# Patient Record
Sex: Male | Born: 1950 | Race: White | Hispanic: No | Marital: Married | Smoking: Former smoker
Health system: Southern US, Community
[De-identification: ages and names within clinical notes are randomized; demographics above are authoritative.]

## PROBLEM LIST (undated history)

## (undated) DIAGNOSIS — K5790 Diverticulosis of intestine, part unspecified, without perforation or abscess without bleeding: Secondary | ICD-10-CM

## (undated) DIAGNOSIS — I10 Essential (primary) hypertension: Secondary | ICD-10-CM

## (undated) DIAGNOSIS — K219 Gastro-esophageal reflux disease without esophagitis: Secondary | ICD-10-CM

## (undated) DIAGNOSIS — R011 Cardiac murmur, unspecified: Secondary | ICD-10-CM

## (undated) DIAGNOSIS — E7800 Pure hypercholesterolemia, unspecified: Secondary | ICD-10-CM

## (undated) DIAGNOSIS — I341 Nonrheumatic mitral (valve) prolapse: Secondary | ICD-10-CM

## (undated) DIAGNOSIS — I639 Cerebral infarction, unspecified: Secondary | ICD-10-CM

## (undated) HISTORY — PX: TEMPOROMANDIBULAR JOINT SURGERY: SHX35

## (undated) HISTORY — PX: RETINAL DETACHMENT SURGERY: SHX105

## (undated) HISTORY — PX: MITRAL VALVE REPLACEMENT: SHX147

---

## 2007-10-11 ENCOUNTER — Ambulatory Visit: Payer: Self-pay | Admitting: Internal Medicine

## 2011-01-14 ENCOUNTER — Ambulatory Visit: Payer: Self-pay | Admitting: Ophthalmology

## 2012-03-16 ENCOUNTER — Ambulatory Visit: Payer: Self-pay | Admitting: Ophthalmology

## 2012-09-21 ENCOUNTER — Ambulatory Visit: Payer: Self-pay | Admitting: Ophthalmology

## 2012-12-09 ENCOUNTER — Ambulatory Visit: Payer: Self-pay | Admitting: Orthopedic Surgery

## 2012-12-20 ENCOUNTER — Ambulatory Visit: Payer: Self-pay | Admitting: Orthopedic Surgery

## 2014-06-01 ENCOUNTER — Ambulatory Visit: Payer: Self-pay | Admitting: Gastroenterology

## 2014-06-04 LAB — PATHOLOGY REPORT

## 2015-04-16 NOTE — Op Note (Signed)
PATIENT NAME:  Terry [name], Terry [name] MR#:  [id number] DATE OF BIRTH:  October 22, 1951  DATE OF PROCEDURE:  09/21/2012  PROCEDURES PERFORMED:  1. Pars plana vitrectomy of the right eye.  2. Internal limiting membrane peel of the right eye.   PREOPERATIVE DIAGNOSIS: Epiretinal membrane and retinal edema (Not Dictated)   POSTOPERATIVE DIAGNOSIS: <<Same>> (Not Dictated)   ESTIMATED BLOOD LOSS: Less than 1 mL.  PRIMARY SURGEON: Ignacia FellingMatthew F. Criselda Starke, MD   ANESTHESIA: Retrobulbar block of the right eye with monitored anesthesia care.   COMPLICATIONS: None.   INDICATIONS FOR PROCEDURE: This is a patient who presented to my office several months after retinal detachment repair with severe metamorphopsia in the right eye. Examination revealed an extremely significant epiretinal membrane of the right eye. There was associated retinal edema. Risks, benefits, and alternatives of the above procedure were discussed and the patient wished to proceed.   DETAILS OF PROCEDURE: After informed consent was obtained, the patient was brought into the operative suite at [facility]. The patient was placed in supine position, was given a small dose of Alfenta, and a retrobulbar block performed on the right eye by the primary surgeon without any complications. Right eye was prepped and draped in a sterile manner. After lid speculum was inserted, a 25-gauge trocar was placed inferotemporally through displaced conjunctiva in an oblique fashion 3 mm beyond the limbus. The infusion cannula was turned on and inserted through the trocar and secured into position with Steri-Strips. Two more trocars were placed in a similar fashion superotemporally and superonasally. The vitreous cutter and light pipe were introduced in the eye and peripheral vitreous was trimmed. Indocyanine green was injected onto the posterior pole and removed within 30 seconds. The epiretinal membrane was peeled from the retina without any  complication. Internal limiting membrane was then peeled around the fovea. Internal limiting membrane was confirmed to have come up with the epiretinal membrane nasally. The internal limiting membrane was peeled for 1.5 disk diameters inferiorly, temporally, and superiorly. The periphery was examined for 360 degrees of scleral depressed exam. No signs of any breaks or tears could be identified. A partial air-fluid exchange was performed. The trocars were removed and the wounds were noted to be airtight. 5 mg of dexamethasone was given into the inferior fornix and the lid speculum was removed. The eye was cleaned and TobraDex was placed in the eye. A patch and shield was placed over the eye and the patient was taken to postanesthesia care with instructions to remain head up.  ____________________________ Ignacia FellingMatthew F. Champ MungoAppenzeller, MD mfa:drc D: 09/21/2012 09:05:55 ET T: 09/21/2012 12:34:21 ET JOB#: [id number]  cc: Ignacia FellingMatthew F. Champ MungoAppenzeller, MD, <Dictator> Cline CoolsMATTHEW F Ebonee Stober MD ELECTRONICALLY SIGNED 10/25/2012 17:44

## 2015-04-21 NOTE — Op Note (Signed)
PATIENT NAME:  [name], [name] MR#:  [id number] DATE OF BIRTH:  02-10-1951  DATE OF PROCEDURE:  03/16/2012  PROCEDURES PERFORMED:  1. Pars planGeorgeanna Harrisona [name] of the right eye 2. Gas exchange, right eye.  3. Endolaser, right eye.  PREOPERATIVE DIAGNOSIS: Rhegmatogenous retinal detachment of the right eye.   POSTOPERATIVE DIAGNOSIS: Rhegmatogenous retinal detachment of the right eye.  ESTIMATED BLOOD LOSS: Less than 1 mL.  PRIMARY SURGEON: Terry FellingMatthew F. Zuzu Befort, MD   ANESTHESIA: Retrobulbar block of the right eye with monitored anesthesia care.   COMPLICATIONS: None.   INDICATIONS FOR PROCEDURE: This is a patient who presented to my office with a curtain in his right eye and decreased vision. Examination revealed a macula off rhegmatogenous retinal detachment of the right eye. Risks, benefits, and alternatives of the above procedure were discussed and the patient wished to proceed.   DETAILS OF PROCEDURE: After informed consent was obtained, the patient was brought in the operative suite at [facility]. The patient was placed in supine position and was given a small dose of alfentin and a retrobulbar block was performed on the right eye by the primary surgeon without any complications. The right eye was prepped and draped in sterile manner. After lid speculum was inserted, a 25-gauge trocar was placed inferotemporally through displaced conjunctiva in an oblique fashion 3 mm beyond the limbus. The infusion cannula was turned on and inserted through the trocar and secured into position with Steri-Strips. Two more trocars were placed in a similar fashion superotemporally and superonasally. The vitreous cutter and light pipe were introduced into the eye and a core [name] was performed. Extreme care was taken to do peripheral vitreal shaving out to the ora serrata for 360 degrees. A small area of lattice was noted inferiorly without any signs of tears. Extremely small microscopic  pseudophakic breaks were noted in the far periphery of the retinal detachment. Endo cautery was introduced in the eye and the inferior lattice was marked and a draining retinotomy was created at approximately 1 o'clock above the temporal arcades. Soft tip was introduced and a complete air-fluid exchange was performed and the retina was completely flattened. Endolaser was introduced into the eye and four rows of laser was placed around the draining retinotomy. Five to six rows of laser was placed in the peripheral retina from one end of the retinal detachment to the other to cover for any pseudophakic small breaks. Three to four rows of laser was placed around the inferior lattice. No other breaks or tears could be identified. Any remnant fluid was then removed. The superonasal trocar was removed. 22% SF6 was used as an Systems developerair-gas exchange. Once completed, the trocars were removed and the superotemporal wound was noted to have a very small air leak. This was closed using transconjunctival interrupted 6-0 plain gut. The SF6 was used on a 30-gauge needle to pressurize the eye to approximately 15 mmHg. The wounds were noted to be airtight and 5 mg of dexamethasone was given into the inferior fornix. The lid speculum was removed and the eye was cleaned. TobraDex was placed on the eye and a patch and shield was placed over the eye. The patient was taken to postanesthesia care with instructions to remain on his right side for one hour followed by head up positioning.   ____________________________ Terry FellingMatthew F. Champ MungoAppenzeller, MD mfa:drc D: 03/16/2012 11:34:03 ET T: 03/16/2012 12:19:23 ET JOB#: [id number]  cc: Terry FellingMatthew F. Champ MungoAppenzeller, MD, <Dictator> Terry CoolsMATTHEW F Ferron Ishmael MD ELECTRONICALLY SIGNED 03/30/2012 7:17

## 2017-02-18 ENCOUNTER — Ambulatory Visit
Admission: RE | Admit: 2017-02-18 | Discharge: 2017-02-18 | Disposition: A | Discharge status: Home / Self Care | Payer: BLUE CROSS/BLUE SHIELD | Source: Ambulatory Visit | Attending: Cardiology | Admitting: Cardiology

## 2017-02-18 ENCOUNTER — Encounter
Admission: RE | Disposition: A | Discharge status: Home / Self Care | Payer: Self-pay | Source: Ambulatory Visit | Attending: Cardiology

## 2017-02-18 ENCOUNTER — Encounter: Payer: Self-pay | Admitting: *Deleted

## 2017-02-18 DIAGNOSIS — Z881 Allergy status to other antibiotic agents status: Secondary | ICD-10-CM | POA: Diagnosis not present

## 2017-02-18 DIAGNOSIS — Z87891 Personal history of nicotine dependence: Secondary | ICD-10-CM | POA: Insufficient documentation

## 2017-02-18 DIAGNOSIS — Z880 Allergy status to penicillin: Secondary | ICD-10-CM | POA: Insufficient documentation

## 2017-02-18 DIAGNOSIS — Z91041 Radiographic dye allergy status: Secondary | ICD-10-CM | POA: Diagnosis not present

## 2017-02-18 DIAGNOSIS — K219 Gastro-esophageal reflux disease without esophagitis: Secondary | ICD-10-CM | POA: Diagnosis not present

## 2017-02-18 DIAGNOSIS — I10 Essential (primary) hypertension: Secondary | ICD-10-CM | POA: Diagnosis not present

## 2017-02-18 DIAGNOSIS — I2510 Atherosclerotic heart disease of native coronary artery without angina pectoris: Secondary | ICD-10-CM | POA: Diagnosis not present

## 2017-02-18 DIAGNOSIS — E785 Hyperlipidemia, unspecified: Secondary | ICD-10-CM | POA: Insufficient documentation

## 2017-02-18 DIAGNOSIS — I340 Nonrheumatic mitral (valve) insufficiency: Secondary | ICD-10-CM | POA: Diagnosis present

## 2017-02-18 HISTORY — PX: RIGHT/LEFT HEART CATH AND CORONARY ANGIOGRAPHY: CATH118266

## 2017-02-18 HISTORY — DX: Nonrheumatic mitral (valve) prolapse: I34.1

## 2017-02-18 HISTORY — DX: Essential (primary) hypertension: I10

## 2017-02-18 HISTORY — DX: Pure hypercholesterolemia, unspecified: E78.00

## 2017-02-18 HISTORY — DX: Cardiac murmur, unspecified: R01.1

## 2017-02-18 HISTORY — DX: Diverticulosis of intestine, part unspecified, without perforation or abscess without bleeding: K57.90

## 2017-02-18 HISTORY — DX: Gastro-esophageal reflux disease without esophagitis: K21.9

## 2017-02-18 SURGERY — RIGHT/LEFT HEART CATH AND CORONARY ANGIOGRAPHY
Anesthesia: Moderate Sedation | Laterality: Bilateral

## 2017-02-18 MED ORDER — FAMOTIDINE 20 MG PO TABS
ORAL_TABLET | ORAL | Status: AC
  Administered 2017-02-18: 20 mg
  Filled 2017-02-18: qty 1

## 2017-02-18 MED ORDER — IOPAMIDOL (ISOVUE-300) INJECTION 61%
INTRAVENOUS | Status: DC | PRN
  Administered 2017-02-18: 160 mL via INTRA_ARTERIAL

## 2017-02-18 MED ORDER — METHYLPREDNISOLONE SODIUM SUCC 125 MG IJ SOLR
125.0000 mg | Freq: Once | INTRAMUSCULAR | Status: AC
  Administered 2017-02-18: 125 mg via INTRAVENOUS

## 2017-02-18 MED ORDER — ASPIRIN 81 MG PO CHEW: 81.0000 mg | CHEWABLE_TABLET | Freq: Once | ORAL | Status: DC

## 2017-02-18 MED ORDER — ASPIRIN 81 MG PO CHEW
CHEWABLE_TABLET | ORAL | Status: AC
  Administered 2017-02-18: 81 mg
  Filled 2017-02-18: qty 1

## 2017-02-18 MED ORDER — HEPARIN (PORCINE) IN NACL 2-0.9 UNIT/ML-% IJ SOLN
INTRAMUSCULAR | Status: AC
  Filled 2017-02-18: qty 500

## 2017-02-18 MED ORDER — FENTANYL CITRATE (PF) 100 MCG/2ML IJ SOLN
INTRAMUSCULAR | Status: AC
  Filled 2017-02-18: qty 2

## 2017-02-18 MED ORDER — SODIUM CHLORIDE 0.9 % IV SOLN
INTRAVENOUS | Status: DC
  Administered 2017-02-18: 07:00:00 via INTRAVENOUS

## 2017-02-18 MED ORDER — SODIUM CHLORIDE 0.9% FLUSH: 10.0000 mL | Freq: Once | INTRAVENOUS | Status: DC

## 2017-02-18 MED ORDER — HYDROCOD POLST-CPM POLST ER 10-8 MG/5ML PO SUER: 5.0000 mL | Freq: Once | ORAL | Status: DC

## 2017-02-18 MED ORDER — FAMOTIDINE 20 MG PO TABS: 20.0000 mg | ORAL_TABLET | Freq: Once | ORAL | Status: DC

## 2017-02-18 MED ORDER — FENTANYL CITRATE (PF) 100 MCG/2ML IJ SOLN
INTRAMUSCULAR | Status: DC | PRN
  Administered 2017-02-18: 25 ug via INTRAVENOUS

## 2017-02-18 MED ORDER — MIDAZOLAM HCL 2 MG/2ML IJ SOLN
INTRAMUSCULAR | Status: DC | PRN
  Administered 2017-02-18: 1 mg via INTRAVENOUS

## 2017-02-18 MED ORDER — METHYLPREDNISOLONE SODIUM SUCC 125 MG IJ SOLR
INTRAMUSCULAR | Status: AC
  Filled 2017-02-18: qty 2

## 2017-02-18 MED ORDER — MIDAZOLAM HCL 2 MG/2ML IJ SOLN
INTRAMUSCULAR | Status: AC
  Filled 2017-02-18: qty 2

## 2017-02-18 SURGICAL SUPPLY — 16 items
CATH 5FR JL4 DIAGNOSTIC (CATHETERS) ×2 IMPLANT
CATH 5FR JR4 DIAGNOSTIC (CATHETERS) ×3 IMPLANT
CATH INFINITI 5FR ANG PIGTAIL (CATHETERS) ×3 IMPLANT
CATH SWANZ 7F THERMO (CATHETERS) ×3 IMPLANT
DEVICE CLOSURE MYNXGRIP 5F (Vascular Products) ×3 IMPLANT
DEVICE INFLAT 30 PLUS (MISCELLANEOUS) IMPLANT
GUIDEWIRE EMER 3M J .025X150CM (WIRE) ×3 IMPLANT
KIT MANI 3VAL PERCEP (MISCELLANEOUS) ×3 IMPLANT
KIT RIGHT HEART (MISCELLANEOUS) ×3 IMPLANT
NEEDLE PERC 18GX7CM (NEEDLE) ×3 IMPLANT
PACK CARDIAC CATH (CUSTOM PROCEDURE TRAY) ×3 IMPLANT
SHEATH AVANTI 5FR X 11CM (SHEATH) ×3 IMPLANT
SHEATH AVANTI 6FR X 11CM (SHEATH) IMPLANT
SHEATH PINNACLE 7F 10CM (SHEATH) ×3 IMPLANT
WIRE EMERALD 3MM-J .035X150CM (WIRE) ×3 IMPLANT
WIRE EMERALD ST .035X150CM (WIRE) ×3 IMPLANT

## 2017-09-27 ENCOUNTER — Encounter (INDEPENDENT_AMBULATORY_CARE_PROVIDER_SITE_OTHER): Payer: Self-pay | Admitting: Vascular Surgery

## 2017-09-27 ENCOUNTER — Other Ambulatory Visit (INDEPENDENT_AMBULATORY_CARE_PROVIDER_SITE_OTHER): Payer: BLUE CROSS/BLUE SHIELD

## 2017-09-27 ENCOUNTER — Ambulatory Visit (INDEPENDENT_AMBULATORY_CARE_PROVIDER_SITE_OTHER): Payer: BLUE CROSS/BLUE SHIELD | Admitting: Vascular Surgery

## 2017-09-27 DIAGNOSIS — I872 Venous insufficiency (chronic) (peripheral): Secondary | ICD-10-CM

## 2017-09-27 DIAGNOSIS — M79606 Pain in leg, unspecified: Secondary | ICD-10-CM | POA: Insufficient documentation

## 2017-09-27 DIAGNOSIS — I8390 Asymptomatic varicose veins of unspecified lower extremity: Secondary | ICD-10-CM | POA: Insufficient documentation

## 2017-09-27 DIAGNOSIS — I83811 Varicose veins of right lower extremities with pain: Secondary | ICD-10-CM

## 2017-09-27 DIAGNOSIS — M79604 Pain in right leg: Secondary | ICD-10-CM

## 2017-09-27 DIAGNOSIS — K219 Gastro-esophageal reflux disease without esophagitis: Secondary | ICD-10-CM

## 2017-09-27 NOTE — Progress Notes (Addendum)
MRN : [id number]  Terry [name] is a 66 y.o. (24-Jan-1951) male who presents with chief complaint of  Chief Complaint  Patient presents with  . Varicose Veins  .  History of Present Illness: The patient is seen for evaluation of symptomatic varicose veins. The patient relates burning and stinging which worsened steadily throughout the course of the day, particularly with standing. The patient also notes an aching and throbbing pain over the varicosities, particularly with prolonged dependent positions. The symptoms are significantly improved with elevation.  The patient also notes that during hot weather the symptoms are greatly intensified. The patient states the pain from the varicose veins interferes with work, daily exercise, shopping and household maintenance. At this point, the symptoms are persistent and severe enough that they're having a negative impact on lifestyle and are interfering with daily activities.  There is no history of DVT, PE or superficial thrombophlebitis. There is no history of ulceration or hemorrhage. The patient denies a significant family history of varicose veins.   The patient has worn graduated compression in the past. At the present time the patient has not been using over-the-counter analgesics. There is a history of prior surgical intervention and sclerotherapy at another institution.    Current Meds  Medication Sig  . clobetasol ointment (TEMOVATE) 0.05 % Apply 1 application topically every other day.  . Multiple Vitamins-Minerals (MULTIVITAMIN PO) Take 1 tablet by mouth daily.  Marland Kitchen omeprazole (PRILOSEC) 20 MG capsule Take 20 mg by mouth daily.  . Probiotic Product (PROBIOTIC PO) Take 1 capsule by mouth daily.  Marland Kitchen triamcinolone cream (KENALOG) 0.1 % Apply 1 application topically every other day.   . valACYclovir (VALTREX) 500 MG tablet Take 500 mg by mouth daily.  . vitamin B-12 (CYANOCOBALAMIN) 1000 MCG tablet Take 1,000 mcg by mouth daily.    Past  Medical History:  Diagnosis Date  . Diverticulosis   . GERD (gastroesophageal reflux disease)   . Heart murmur   . Hypercholesteremia   . Hypertension   . Mitral valve prolapse     Past Surgical History:  Procedure Laterality Date  . RETINAL DETACHMENT SURGERY    . RIGHT/LEFT HEART CATH AND CORONARY ANGIOGRAPHY Bilateral 02/18/2017   Procedure: Right/Left Heart Cath and Coronary Angiography;  Surgeon: Marcina Millard, MD;  Location: [facility] INVASIVE CV LAB;  Service: Cardiovascular;  Laterality: Bilateral;  . TEMPOROMANDIBULAR JOINT SURGERY      Social History Social History  Substance Use Topics  . Smoking status: Former Smoker    Quit date: 1985  . Smokeless tobacco: Never Used  . Alcohol use Yes     Comment: ocassionally    Family History Family History  Problem Relation Age of Onset  . Varicose Veins Mother   . Cancer Father   No family history of bleeding/clotting disorders, porphyria or autoimmune disease   Allergies  Allergen Reactions  . Penicillins Shortness Of Breath  . Ciprofloxacin Rash  . Ivp Dye [Iodinated Diagnostic Agents] Rash     REVIEW OF SYSTEMS (Negative unless checked)  Constitutional: Weight loss  Fever  Chills Cardiac: Chest pain   Chest pressure   Palpitations   Shortness of breath when laying flat   Shortness of breath with exertion. Vascular:  Pain in legs with walking   Pain in legs with standing  History of DVT   Phlebitis   Swelling in legs   Varicose veins   Non-healing ulcers Pulmonary:   Uses home oxygen   Productive cough     Hemoptysis   Wheeze  COPD   Asthma Neurologic:  Dizziness   Seizures   History of stroke   History of TIA  Aphasia   Vissual changes   Weakness or numbness in arm   Weakness or numbness in leg Musculoskeletal:   Joint swelling   Joint pain   Low back pain Hematologic:  Easy bruising  Easy bleeding   Hypercoagulable state    Anemic Gastrointestinal:  Diarrhea   Vomiting  Gastroesophageal reflux/heartburn   Difficulty swallowing. Genitourinary:  Chronic kidney disease   Difficult urination  Frequent urination   Blood in urine Skin:  Rashes   Ulcers  Psychological:  History of anxiety    History of major depression.  Physical Examination  Vitals:   09/27/17 0957  BP: (!) 153/85  Pulse: 60  Resp: 17  Weight: 182 lb (82.6 kg)  Height:  (1.676 m)   Body mass index is 29.38 kg/m. Gen: WD/WN, NAD Head: [place]/AT, No temporalis wasting.  Ear/Nose/Throat: Hearing grossly intact, nares w/o erythema or drainage, poor dentition Eyes: PER, EOMI, sclera nonicteric.  Neck: Supple, no masses.  No bruit or JVD.  Pulmonary:  Good air movement, clear to auscultation bilaterally, no use of accessory muscles.  Cardiac: RRR, normal S1, S2, no Murmurs. Vascular: Large varicosities present extensively greater than 10 mm right leg primarily on the posterior calf.  Mild venous stasis changes to the legs bilaterally. 1+ soft pitting edema Vessel Right Left  Radial Palpable Palpable  PT Palpable Palpable  DP Palpable Palpable  Gastrointestinal: soft, non-distended. No guarding/no peritoneal signs.  Musculoskeletal: M/S 5/5 throughout.  No deformity or atrophy.  Neurologic: CN 2-12 intact. Pain and light touch intact in extremities.  Symmetrical.  Speech is fluent. Motor exam as listed above. Psychiatric: Judgment intact, Mood & affect appropriate for pt's clinical situation. Dermatologic: No rashes or ulcers noted.  No changes consistent with cellulitis. Lymph : No Cervical lymphadenopathy, no lichenification or skin changes of chronic lymphedema.  CBC No results found for: WBC, HGB, HCT, MCV, PLT  BMET No results found for: NA, K, CL, CO2, GLUCOSE, BUN, CREATININE, CALCIUM, GFRNONAA, GFRAA CrCl cannot be calculated (No order found.).  COAG No results found for: INR, PROTIME  Radiology No  results found.    Assessment/Plan 1. Pain of right lower extremity  Recommend:  The patient has large symptomatic varicose veins that are painful and associated with swelling.  I have had a long discussion with the patient regarding  varicose veins and why they cause symptoms.  Patient will begin wearing graduated compression stockings class 1 on a daily basis, beginning first thing in the morning and removing them in the evening. The patient is instructed specifically not to sleep in the stockings.    The patient  will also begin using over-the-counter analgesics such as Motrin 600 mg po TID to help control the symptoms.    In addition, behavioral modification including elevation during the day will be initiated.    Pending the results of these changes the  patient will be reevaluated in three months.   An  ultrasound of the venous system will be obtained.   Further plans will be based on the ultrasound results and whether conservative therapies are successful at eliminating the pain and swelling.   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  He still has persistent symptoms of pain and swelling that are having a negative impact on daily life and daily activities.  Patient should undergo injection sclerotherapy to treat the residual varicosities.  The risks, benefits and alternative therapies were reviewed in detail with the patient.  All questions were answered.  The patient agrees to proceed with sclerotherapy at their convenience.  The patient will continue wearing the graduated compression stockings and using the over-the-counter pain medications to treat her symptoms.       2. Varicose veins of right lower extremity with pain  Recommend:  The patient has large symptomatic  varicose veins that are painful and associated with swelling.  I have had a long discussion with the patient regarding  varicose veins and why they cause symptoms.  Patient will begin wearing graduated compression stockings class 1 on a daily basis, beginning first thing in the morning and removing them in the evening. The patient is instructed specifically not to sleep in the stockings.    The patient  will also begin using over-the-counter analgesics such as Motrin 600 mg po TID to help control the symptoms.    In addition, behavioral modification including elevation during the day will be initiated.    Pending the results of these changes the  patient will be reevaluated in three months.   An  ultrasound of the venous system will be obtained.   Further plans will be based on the ultrasound results and whether conservative therapies are successful at eliminating the pain and swelling.   - VAS [place] LOWER EXTREMITY VENOUS REFLUX; Future  3. Venous insufficiency No surgery or intervention at this point in time.    I have had a long discussion with the patient regarding venous insufficiency and why it  causes symptoms. I have discussed with the patient the chronic skin changes that accompany venous insufficiency and the long term sequela such as infection and ulceration.  Patient will begin wearing graduated compression stockings class 1 (20-30 mmHg) or compression wraps on a daily basis a prescription was given. The patient will put the stockings on first thing in the morning and removing them in the evening. The patient is instructed specifically not to sleep in the stockings.    In addition, behavioral modification including several periods of elevation of the lower extremities during the day will be continued. I have demonstrated that proper elevation is a position with the ankles at heart level.  The patient is instructed to begin routine exercise, especially walking on a daily basis  Patient  should undergo duplex ultrasound of the venous system to ensure that DVT or reflux is not present.  Following the review of the ultrasound the patient will follow up in 2-3 months to reassess the degree of swelling and the control that graduated compression stockings or compression wraps  is offering.   The patient can be assessed for a Lymph Pump at that time  4. Gastroesophageal reflux disease without esophagitis Continue PPI as already ordered, these medications have been reviewed and there are no changes at this time.     Levora Dredge, MD  09/27/2017 10:23 AM

## 2017-11-08 ENCOUNTER — Encounter (INDEPENDENT_AMBULATORY_CARE_PROVIDER_SITE_OTHER): Payer: Self-pay | Admitting: Vascular Surgery

## 2017-11-08 ENCOUNTER — Ambulatory Visit (INDEPENDENT_AMBULATORY_CARE_PROVIDER_SITE_OTHER): Payer: BLUE CROSS/BLUE SHIELD | Admitting: Vascular Surgery

## 2017-11-08 VITALS — BP 148/88 | HR 85 | Resp 16 | Wt 185.0 lb

## 2017-11-08 DIAGNOSIS — I83813 Varicose veins of bilateral lower extremities with pain: Secondary | ICD-10-CM

## 2017-11-08 DIAGNOSIS — I872 Venous insufficiency (chronic) (peripheral): Secondary | ICD-10-CM

## 2017-11-08 NOTE — Progress Notes (Signed)
    MRN : [id number]  Georgeanna HarrisonLarry K Kopecky is a 66 y.o. (May 28, 1951) male who presents with chief complaint of painful varicose vein   Procedure:  Sclerotherapy using hypertonic saline mixed with 1% Lidocaine was performed on the right lower extremity.  Compression wraps were placed.  The patient tolerated the procedure well.  Plan:  Follow up as arranged

## 2018-03-30 ENCOUNTER — Other Ambulatory Visit: Payer: Self-pay

## 2018-03-30 ENCOUNTER — Ambulatory Visit: Payer: Medicare HMO | Attending: Sports Medicine | Admitting: Physical Therapy

## 2018-03-30 ENCOUNTER — Encounter: Payer: Self-pay | Admitting: Physical Therapy

## 2018-03-30 DIAGNOSIS — M25562 Pain in left knee: Secondary | ICD-10-CM | POA: Diagnosis present

## 2018-03-30 DIAGNOSIS — M5440 Lumbago with sciatica, unspecified side: Secondary | ICD-10-CM | POA: Diagnosis present

## 2018-03-30 DIAGNOSIS — M6281 Muscle weakness (generalized): Secondary | ICD-10-CM | POA: Insufficient documentation

## 2018-03-31 NOTE — Therapy (Signed)
[name] [name], [place], [address] Phone: [phone]   Fax:  [phone]  Physical Therapy Evaluation  Patient Details  Name: [name] MRN: [id number] Date of Birth: 1951/09/04 Referring Provider: Rodolph Bong MD   Encounter Date: 03/30/2018  PT End of Session - 03/30/18 1200    Visit Number  1    Number of Visits  12    Date for PT Re-Evaluation  05/11/18    PT Start Time  1028    PT Stop Time  1125    PT Time Calculation (min)  57 min    Activity Tolerance  Patient tolerated treatment well    Behavior During Therapy  [facility] for tasks assessed/performed       Past Medical History:  Diagnosis Date  . Diverticulosis   . GERD (gastroesophageal reflux disease)   . Heart murmur   . Hypercholesteremia   . Hypertension   . Mitral valve prolapse     Past Surgical History:  Procedure Laterality Date  . RETINAL DETACHMENT SURGERY    . RIGHT/LEFT HEART CATH AND CORONARY ANGIOGRAPHY Bilateral 02/18/2017   Procedure: Right/Left Heart Cath and Coronary Angiography;  Surgeon: Terry Millard, MD;  Location: [facility] INVASIVE CV LAB;  Service: Cardiovascular;  Laterality: Bilateral;  . TEMPOROMANDIBULAR JOINT SURGERY      There were no vitals filed for this visit.   Subjective Assessment - 03/30/18 1038    Subjective  Patient reports he has pain in hips with sitting in car for prolonged time, better after getting up and walking a little while.     Pertinent History  Patient reports having hip and back pain over the years then last year he lifted something and felt pan in back. He went to MD and was told he had inflammation in hips and mild problem in knees.     Limitations  Sitting;Lifting;Standing;House hold activities;Other (comment) bending and repeative bending    Patient Stated Goals  to decrease pain and be able to drive, sit withou pain    Currently in Pain?  Yes    Pain Score  3  worst  10/10 with sitting 30 min.; best pain 0/10 with walking    Pain Location  Hip    Pain Orientation  Right;Left;Posterior    Pain Descriptors / Indicators  Aching    Pain Type  Chronic pain    Pain Onset  More than a month ago    Pain Frequency  Intermittent    Aggravating Factors   sitting    Pain Relieving Factors  walking, resting    Effect of Pain on Daily Activities  driving         [facility] PT Assessment - 03/30/18 1047      Assessment   Medical Diagnosis  Low back pain, left leg weakness, lumbar radiculopathy    Referring Provider  Terry Bong MD    Onset Date/Surgical Date  01/28/17    Hand Dominance  Right    Prior Therapy  no      Balance Screen   Has the patient fallen in the past 6 months  Yes    How many times?  1x 3 weeks ago passed out and fell on floor getting up too fast     Has the patient had a decrease in activity level because of a fear of falling?   No    Is the patient reluctant to leave their home because  of a fear of falling?   No      Home Public house manager residence    Living Arrangements  Spouse/significant other;Children    Type of Home  House    Home Access  Stairs to enter    Entrance Stairs-Number of Steps  4    Entrance Stairs-Rails  Right;Left    Home Layout  One level      Prior Function   Level of Independence  Independent    Vocation  Retired    Leisure  fishing, golfing       Cognition   Overall Cognitive Status  Within Functional Limits for tasks assessed      Observation/Other Assessments   Focus on Therapeutic Outcomes (FOTO)   to be assessed    Modified [place]  to be assessed      Posture/Postural Control   Posture Comments  increased thoracic kyphosis, decresaed lumbar lordosis      ROM / Strength   AROM / PROM / Strength  AROM;Strength      AROM   Overall AROM Comments  lumbar spine limited flexion 50%, extension 25%      Strength   Overall Strength Comments  bilateral LE's major muscle  groups WNL's without reproduction of symptoms       Palpation   Palpation comment  spasms left lumbar spine, no tenderness over SIJs      FABER test   findings  Negative    Comment  right hip limited ER as compared to right with stretch pain in hip at full ER      Slump test   Findings  Negative    Comment  bilaterally      Straight Leg Raise   Findings  Negative    Comment  bilaterallly and (-) crossed SLR        Objective measurements completed on examination: See above findings.      PT Education - 03/30/18 1100    Education provided  Yes    Education Details  POC; body mechanics,exercises; prone, prone prop on elbows, hip addution with ball and glute squeeze    Person(s) Educated  Patient    Methods  Explanation;Demonstration;Verbal cues;Handout    Comprehension  Verbal cues required;Returned demonstration;Verbalized understanding          PT Long Term Goals - 03/30/18 1130      PT LONG TERM GOAL #1   Title  Patient will improve FOTO and MODI scores by 10 points demonstrating improved function with daily tasks with decreased pain    Status  New    Target Date  04/20/18      PT LONG TERM GOAL #2   Title  Patient will improve FOTO and MODI scores by 20 points demonstrating improved function with daily tasks with decreased pain    Status  New    Target Date  05/11/18      PT LONG TERM GOAL #3   Title  Patient will be independent with home program for pain control and exercises to allow transition to self management    Baseline  patin has limited knowledge of appropriate pain control strategies and exercise progression without guidance, cuing    Status  New    Target Date  05/11/18             Plan - 03/30/18 1130    Clinical Impression Statement  Patient is a 67 year old male who presents with chronic  back, hip and knee pain. He has limitations with ROM lumbar spiine and right hip rotation. He has functional limitations with driving and bending  activities. He has limited knowledge of appropriate pain control strategies, exercises and progression annd will benefit from physical therapy intervention.     History and Personal Factors relevant to plan of care:  Patient reports having hip and back pain over the years then last year he lifted something and felt pan in back. He went to MD and was told he had inflammation in hips and mild problem in knees.     Clinical Presentation  Stable    Clinical Decision Making  Low    Rehab Potential  Good    Clinical Impairments Affecting Rehab Potential  (+)motivated, prior level of function (-)age, chronic condition    PT Frequency  2x / week    PT Duration  6 weeks    PT Treatment/Interventions  Cryotherapy;Electrical Stimulation;Ultrasound;Moist Heat;Therapeutic activities;Therapeutic exercise;Patient/family education;Neuromuscular re-education;Manual techniques    PT Next Visit Plan  pain control, assess FOTO, MODI; exercise progression core     PT Home Exercise Plan  prone, prone on elbows, hip adduction with ball and glute sets    Consulted and Agree with Plan of Care  Patient       Patient will benefit from skilled therapeutic intervention in order to improve the following deficits and impairments:  Pain, Improper body mechanics, Increased muscle spasms, Postural dysfunction, Decreased activity tolerance, Decreased endurance, Decreased range of motion, Decreased strength, Impaired perceived functional ability  Visit Diagnosis: Low back pain with sciatica, sciatica laterality unspecified, unspecified back pain laterality, unspecified chronicity - Plan: PT plan of care cert/re-cert  Left knee pain, unspecified chronicity - Plan: PT plan of care cert/re-cert  Muscle weakness (generalized) - Plan: PT plan of care cert/re-cert     Problem List Patient Active Problem List   Diagnosis Date Noted  . Leg pain 09/27/2017  . 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 Venous insufficiency 09/27/2017  .  GERD (gastroesophageal reflux disease) 09/27/2017    [name], Marie PT 03/31/2018, 9:19 AM  [facility] [address] [place], [place], [address] Phone: [phone]   Fax:  [phone]  Name: Georgeanna HarrisonLarry K Collet MRN: [id number] Date of Birth: 06-30-1951

## 2018-04-05 ENCOUNTER — Ambulatory Visit: Payer: Medicare HMO | Admitting: Physical Therapy

## 2018-04-05 ENCOUNTER — Encounter: Payer: Self-pay | Admitting: Physical Therapy

## 2018-04-05 DIAGNOSIS — M5440 Lumbago with sciatica, unspecified side: Secondary | ICD-10-CM | POA: Diagnosis not present

## 2018-04-05 DIAGNOSIS — M6281 Muscle weakness (generalized): Secondary | ICD-10-CM

## 2018-04-05 DIAGNOSIS — M25562 Pain in left knee: Secondary | ICD-10-CM

## 2018-04-05 NOTE — Therapy (Signed)
[facility] [address] [place], [address] Phone: [phone]   Fax:  [phone]  Physical Therapy Treatment  Patient Details  Name: [name] MRN: [id number] Date of Birth: Nov 27, 1951 Referring Provider: Rodolph Bong MD   Encounter Date: 04/05/2018  PT End of Session - 04/05/18 0828    Visit Number  2    Number of Visits  12    Date for PT Re-Evaluation  05/11/18    PT Start Time  0821    PT Stop Time  0900    PT Time Calculation (min)  39 min    Activity Tolerance  Patient tolerated treatment well    Behavior During Therapy  [facility] for tasks assessed/performed       Past Medical History:  Diagnosis Date  . Diverticulosis   . GERD (gastroesophageal reflux disease)   . Heart murmur   . Hypercholesteremia   . Hypertension   . Mitral valve prolapse     Past Surgical History:  Procedure Laterality Date  . RETINAL DETACHMENT SURGERY    . RIGHT/LEFT HEART CATH AND CORONARY ANGIOGRAPHY Bilateral 02/18/2017   Procedure: Right/Left Heart Cath and Coronary Angiography;  Surgeon: Marcina Millard, MD;  Location: [facility] INVASIVE CV LAB;  Service: Cardiovascular;  Laterality: Bilateral;  . TEMPOROMANDIBULAR JOINT SURGERY      There were no vitals filed for this visit.  Subjective Assessment - 04/05/18 0824    Subjective  Patient reports he is doing better overall still having back pain with prolonged sitting (~45 min). He wants to review his exercises.     Pertinent History  Patient reports having hip and back pain over the years then last year he lifted something and felt pan in back. He went to MD and was told he had inflammation in hips and mild problem in knees.     Limitations  Sitting;Lifting;Standing;House hold activities;Other (comment) bending and repeative bending    Patient Stated Goals  to decrease pain and be able to drive, sit withou pain    Currently in Pain?  No/denies         Objective:  AROM lumbar spine: forward flexion limited 25% by hamstring shortening, extension decreased ~25%; right hip ER decreased 20% as compared to left and improved from previous session palpation: point tender with spasm left lumbar paraspinal muscles quarter sized diameter  Treatment: therapeutic exercise: patient performed with demonstration, tactile, verbal cues of therapist: goal: pain, improve ROM, strength for improved function   supine: hamstring stretch with assistance of therapist: bilateral 15-20 second stretches x 3 following hip extension isometric 5 reps hook lying bridging with ball squeeze x 10, 5 second holds  side lying:  clamshells x 10 with tactile and verbal cues for correct alignment  prone: prone lying x 5 min prone on elbows x 2 min prone press ups x 10 with VC prone heel squeezes x 10, 5 second holds  Patient response to treatment: improved technique with exercises with moderate cuing and demonstration.    PT Education - 04/05/18 0827    Education provided  Yes    Education Details  re assessed HEP added clamshells, hamstring stretch seated, prone press ups, heel squeezes    Person(s) Educated  Patient    Methods  Explanation;Verbal cues;Demonstration, handout   Comprehension  Verbalized understanding;Returned demonstration;Verbal cues required          PT Long Term Goals - 03/30/18 1130  PT LONG TERM GOAL #1   Title  Patient will improve FOTO and MODI scores by 10 points demonstrating improved function with daily tasks with decreased pain    Status  New    Target Date  04/20/18      PT LONG TERM GOAL #2   Title  Patient will improve FOTO and MODI scores by 20 points demonstrating improved function with daily tasks with decreased pain    Status  New    Target Date  05/11/18      PT LONG TERM GOAL #3   Title  Patient will be independent with home program for pain control and exercises to allow transition to self management     Baseline  patin has limited knowledge of appropriate pain control strategies and exercise progression without guidance, cuing    Status  New    Target Date  05/11/18            Plan - 04/05/18 0903    Clinical Impression Statement  Patient is progressing well towards goals with improvement noted in decreasing pain and able to sit for longer periods of time. He is compliant with home program and should continue to improve with additional physical therapy intervention.     Rehab Potential  Good    Clinical Impairments Affecting Rehab Potential  (+)motivated, prior level of function (-)age, chronic condition    PT Frequency  2x / week    PT Duration  6 weeks    PT Treatment/Interventions  Cryotherapy;Electrical Stimulation;Ultrasound;Moist Heat;Therapeutic activities;Therapeutic exercise;Patient/family education;Neuromuscular re-education;Manual techniques    PT Next Visit Plan  pain control, assess FOTO, MODI; exercise progression core     PT Home Exercise Plan  prone, prone on elbows, hip adduction with ball and glute sets       Patient will benefit from skilled therapeutic intervention in order to improve the following deficits and impairments:  Pain, Improper body mechanics, Increased muscle spasms, Postural dysfunction, Decreased activity tolerance, Decreased endurance, Decreased range of motion, Decreased strength, Impaired perceived functional ability  Visit Diagnosis: Low back pain with sciatica, sciatica laterality unspecified, unspecified back pain laterality, unspecified chronicity  Left knee pain, unspecified chronicity  Muscle weakness (generalized)     Problem List Patient Active Problem List   Diagnosis Date Noted  . Leg pain 09/27/2017  . 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 Venous insufficiency 09/27/2017  . GERD (gastroesophageal reflux disease) 09/27/2017    [name], [name] PT 04/06/2018, 8:05 AM  [facility] [address] [place], [place], [address] Phone: [phone]   Fax:  [phone]  Name: [name] MRN: [id number] Date of Birth: [birth year]

## 2018-04-07 ENCOUNTER — Encounter: Payer: Self-pay | Admitting: Physical Therapy

## 2018-04-07 ENCOUNTER — Ambulatory Visit: Payer: Medicare HMO | Admitting: Physical Therapy

## 2018-04-07 DIAGNOSIS — M6281 Muscle weakness (generalized): Secondary | ICD-10-CM

## 2018-04-07 DIAGNOSIS — M5440 Lumbago with sciatica, unspecified side: Secondary | ICD-10-CM | POA: Diagnosis not present

## 2018-04-07 DIAGNOSIS — M25562 Pain in left knee: Secondary | ICD-10-CM

## 2018-04-07 NOTE — Therapy (Signed)
[facility] [address], [place], [address] Phone: [phone]   Fax:  [phone]  Physical Therapy Treatment  Patient Details  Name: [name] MRN: [id number] Date of Birth: [birth year] Referring Provider: Rodolph Bong MD   Encounter Date: 04/07/2018  PT End of Session - 04/07/18 0905    Visit Number  3    Number of Visits  12    Date for PT Re-Evaluation  05/11/18    PT Start Time  0818    PT Stop Time  0903    PT Time Calculation (min)  45 min    Activity Tolerance  Patient tolerated treatment well    Behavior During Therapy  [facility] for tasks assessed/performed       Past Medical History:  Diagnosis Date  . Diverticulosis   . GERD (gastroesophageal reflux disease)   . Heart murmur   . Hypercholesteremia   . Hypertension   . Mitral valve prolapse     Past Surgical History:  Procedure Laterality Date  . RETINAL DETACHMENT SURGERY    . RIGHT/LEFT HEART CATH AND CORONARY ANGIOGRAPHY Bilateral 02/18/2017   Procedure: Right/Left Heart Cath and Coronary Angiography;  Surgeon: Marcina Millard, MD;  Location: [facility] INVASIVE CV LAB;  Service: Cardiovascular;  Laterality: Bilateral;  . TEMPOROMANDIBULAR JOINT SURGERY      There were no vitals filed for this visit.  Subjective Assessment - 04/07/18 0820    Subjective  Patient had a busy day yesterday and was sore "all over". He took a shower and rested to get relief. He woke up with stiffness but no pain in back/hips.      Pertinent History  Patient reports having hip and back pain over the years then last year he lifted something and felt pan in back. He went to MD and was told he had inflammation in hips and mild problem in knees.     Limitations  Sitting;Lifting;Standing;House hold activities;Other (comment) bending and repeative bending    Patient Stated Goals  to decrease pain and be able to drive, sit withou pain    Currently in Pain?  Yes     Pain Score  1     Pain Location  Back    Pain Orientation  Lower    Pain Descriptors / Indicators  Aching    Pain Type  Chronic pain    Pain Onset  More than a month ago    Pain Frequency  Intermittent       Objective:   Treatment: Therapeutic exercise: patient performed with demonstration, tactile, verbal cues of therapist: goal: pain, improve ROM, strength for improved function    supine: hamstring stretch with assistance of therapist: bilateral 20 second stretches x 3  hook lying bridging with ball squeeze x 10, 5 second holds  leg press 55# bilateral x 15, single leg 35# x 15 reps dead lifts single leg holding 5# weight in hand x 10, bilateral holding 10# (2-5# dumbbells) hip extension and abduction on hip rotary machine: 85# for extension x 15 reps each LE, 55# abduction x 10 reps each LE   side lying: bilateral LE's  clamshells x 10    prone: prone lying x 5 min prone on elbows x 2 min prone press ups x 10 with VC prone heel squeezes x 10, 5 second holds   Patient response to treatment: improved technique with exercises with repeated demonstration, repetition and moderate verbal cuing.    PT  Education - 04/07/18 1244    Education provided  Yes    Education Details  exercise instruction for technique    Person(s) Educated  Patient    Methods  Explanation;Demonstration;Verbal cues    Comprehension  Verbalized understanding;Returned demonstration;Verbal cues required          PT Long Term Goals - 03/30/18 1130      PT LONG TERM GOAL #1   Title  Patient will improve FOTO and MODI scores by 10 points demonstrating improved function with daily tasks with decreased pain    Status  New    Target Date  04/20/18      PT LONG TERM GOAL #2   Title  Patient will improve FOTO and MODI scores by 20 points demonstrating improved function with daily tasks with decreased pain    Status  New    Target Date  05/11/18      PT LONG TERM GOAL #3   Title  Patient will be  independent with home program for pain control and exercises to allow transition to self management    Baseline  patin has limited knowledge of appropriate pain control strategies and exercise progression without guidance, cuing    Status  New    Target Date  05/11/18            Plan - 04/07/18 0945    Clinical Impression Statement  Patient is progressing well towards goals with improvement with decreasing pain and able to do more at home. He continues with increased symptoms with prolonged sitting. He requires guidance and cuing to perform exercise and should continue to improve with additional physical therapy intervention.    Rehab Potential  Good    Clinical Impairments Affecting Rehab Potential  (+)motivated, prior level of function (-)age, chronic condition    PT Frequency  2x / week    PT Duration  6 weeks    PT Treatment/Interventions  Cryotherapy;Electrical Stimulation;Ultrasound;Moist Heat;Therapeutic activities;Therapeutic exercise;Patient/family education;Neuromuscular re-education;Manual techniques    PT Next Visit Plan  pain control, assess FOTO, MODI; exercise progression core  OMEGA palloff press forward and perpendicular; straight arm pull downs    PT Home Exercise Plan  prone, prone on elbows, hip adduction with ball and glute sets       Patient will benefit from skilled therapeutic intervention in order to improve the following deficits and impairments:  Pain, Improper body mechanics, Increased muscle spasms, Postural dysfunction, Decreased activity tolerance, Decreased endurance, Decreased range of motion, Decreased strength, Impaired perceived functional ability  Visit Diagnosis: Low back pain with sciatica, sciatica laterality unspecified, unspecified back pain laterality, unspecified chronicity  Left knee pain, unspecified chronicity  Muscle weakness (generalized)     Problem List Patient Active Problem List   Diagnosis Date Noted  . Leg pain 09/27/2017   . 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 Venous insufficiency 09/27/2017  . GERD (gastroesophageal reflux disease) 09/27/2017    [name], Elodie Panameno PT 04/07/2018, 12:47 PM  [facility] [address] [place], [place], [address] Phone: [phone]   Fax:  [phone]  Name: [name] MRN: [id number] Date of Birth: Sep 05, 1951

## 2018-04-18 ENCOUNTER — Ambulatory Visit: Payer: Medicare HMO | Admitting: Physical Therapy

## 2018-04-18 ENCOUNTER — Encounter: Payer: Self-pay | Admitting: Physical Therapy

## 2018-04-18 DIAGNOSIS — M5440 Lumbago with sciatica, unspecified side: Secondary | ICD-10-CM | POA: Diagnosis not present

## 2018-04-18 DIAGNOSIS — M25562 Pain in left knee: Secondary | ICD-10-CM

## 2018-04-18 DIAGNOSIS — M6281 Muscle weakness (generalized): Secondary | ICD-10-CM

## 2018-04-18 NOTE — Therapy (Signed)
[facility] [address], [place], [address] Phone: [phone]   Fax:  [phone]  Physical Therapy Treatment  Patient Details  Name: Terry [name] MRN: [id number] Date of Birth: 12/07/1951 Referring Provider: Rodolph Bong MD   Encounter Date: 04/18/2018  PT End of Session - 04/18/18 1650    Visit Number  4    Number of Visits  12    Date for PT Re-Evaluation  05/11/18    PT Start Time  1647    PT Stop Time  1730    PT Time Calculation (min)  43 min    Activity Tolerance  Patient tolerated treatment well    Behavior During Therapy  [facility] for tasks assessed/performed       Past Medical History:  Diagnosis Date  . Diverticulosis   . GERD (gastroesophageal reflux disease)   . Heart murmur   . Hypercholesteremia   . Hypertension   . Mitral valve prolapse     Past Surgical History:  Procedure Laterality Date  . RETINAL DETACHMENT SURGERY    . RIGHT/LEFT HEART CATH AND CORONARY ANGIOGRAPHY Bilateral 02/18/2017   Procedure: Right/Left Heart Cath and Coronary Angiography;  Surgeon: Marcina Millard, MD;  Location: [facility] INVASIVE CV LAB;  Service: Cardiovascular;  Laterality: Bilateral;  . TEMPOROMANDIBULAR JOINT SURGERY      There were no vitals filed for this visit.  Subjective Assessment - 04/18/18 1647    Subjective  Patient reports he is working out at [employer] and using back support with sitting in car and is doing well.     Pertinent History  Patient reports having hip and back pain over the years then last year he lifted something and felt pan in back. He went to MD and was told he had inflammation in hips and mild problem in knees.     Limitations  Sitting;Lifting;Standing;House hold activities;Other (comment) bending and repeative bending    Patient Stated Goals  to decrease pain and be able to drive, sit withou pain    Currently in Pain?  No/denies            Objective: Flexibility: left LE SLR 45 degrees, right 60 degrees with hamstring shortening limiting motion ROM: AAROM right hip ER limited 25% as compared to left ihp Strength: right hip abduction 4/5, ER 45    Treatment: Therapeutic exercise: patient performed with demonstration, tactile, verbal cues of therapist: goal: pain, improve ROM, strength for improved function    supine: hamstring stretch with assistance of therapist: bilateral 20 second stretches x 3  AAROM bilateral hip ER with 3 reps 20 second holds end range: improved left hip ROM with less discomfort   leg press 55# bilateral x 15, single leg 35# x 15 reps dead lifts single leg holding 5# weight in hand x 10, bilateral holding 10# (2-5# dumbbells) hip extension and abduction on hip rotary machine: 100# then 85# for extension 2 x 15 reps each LE, 55# abduction x 15 reps each LE; improved motor control, technique with repetition and second set  Diagonal wedding march with (2) 5# dumbbells walks x 2 min following demonstration  prone: prone lying x 5 min prone on elbows x 2 min prone press ups x 5 with VC for correct technique, relax at end range prone heel squeezes x 5, 5 second holds   Patient response to treatment: improved technique with exercises with repeated demonstration, repetition and minimal verbal cuing  PT Education - 04/18/18 1648    Education provided  Yes    Education Details  exercise instruction for technique    Person(s) Educated  Patient    Methods  Explanation;Demonstration;Verbal cues    Comprehension  Verbal cues required;Returned demonstration;Verbalized understanding          PT Long Term Goals - 03/30/18 1130      PT LONG TERM GOAL #1   Title  Patient will improve FOTO and MODI scores by 10 points demonstrating improved function with daily tasks with decreased pain    Status  New    Target Date  04/20/18      PT LONG TERM GOAL #2   Title  Patient will improve  FOTO and MODI scores by 20 points demonstrating improved function with daily tasks with decreased pain    Status  New    Target Date  05/11/18      PT LONG TERM GOAL #3   Title  Patient will be independent with home program for pain control and exercises to allow transition to self management    Baseline  patin has limited knowledge of appropriate pain control strategies and exercise progression without guidance, cuing    Status  New    Target Date  05/11/18            Plan - 04/18/18 1650    Clinical Impression Statement  Patient continues to progress with goals with decreasing pain in back and improvement with exercise at local gym. He continues with decreased ROM in hips and decreased hamstring length and decreased strength in hips and will benefit from additional follow up to re assess progress and determine further needs. He will call for another appointment in 2-3 weeks as needed.     Rehab Potential  Good    Clinical Impairments Affecting Rehab Potential  (+)motivated, prior level of function (-)age, chronic condition    PT Frequency  2x / week    PT Duration  6 weeks    PT Treatment/Interventions  Cryotherapy;Electrical Stimulation;Ultrasound;Moist Heat;Therapeutic activities;Therapeutic exercise;Patient/family education;Neuromuscular re-education;Manual techniques    PT Next Visit Plan  pain control, assess FOTO, MODI; exercise progression core     PT Home Exercise Plan  prone, prone on elbows, hip adduction with ball and glute sets       Patient will benefit from skilled therapeutic intervention in order to improve the following deficits and impairments:  Pain, Improper body mechanics, Increased muscle spasms, Postural dysfunction, Decreased activity tolerance, Decreased endurance, Decreased range of motion, Decreased strength, Impaired perceived functional ability  Visit Diagnosis: Low back pain with sciatica, sciatica laterality unspecified, unspecified back pain  laterality, unspecified chronicity  Left knee pain, unspecified chronicity  Muscle weakness (generalized)     Problem List Patient Active Problem List   Diagnosis Date Noted  . Leg pain 09/27/2017  . 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 Venous insufficiency 09/27/2017  . GERD (gastroesophageal reflux disease) 09/27/2017    [name], Marie PT 04/19/2018, 7:56 PM  [facility] [address] [place], [place], [address] Phone: [phone]   Fax:  [phone]  Name: Terry [name] MRN: [id number] Date of Birth: [birth year]

## 2018-04-21 ENCOUNTER — Encounter: Payer: Medicare HMO | Admitting: Physical Therapy

## 2018-04-26 ENCOUNTER — Ambulatory Visit: Payer: Medicare HMO | Admitting: Physical Therapy

## 2018-04-28 ENCOUNTER — Encounter: Payer: Medicare HMO | Admitting: Physical Therapy

## 2018-05-10 ENCOUNTER — Ambulatory Visit: Payer: Medicare HMO | Attending: Sports Medicine | Admitting: Physical Therapy

## 2018-05-17 ENCOUNTER — Ambulatory Visit: Payer: Medicare HMO | Admitting: Physical Therapy

## 2018-05-24 ENCOUNTER — Ambulatory Visit: Payer: Medicare HMO | Admitting: Physical Therapy

## 2019-10-26 ENCOUNTER — Other Ambulatory Visit: Payer: Self-pay

## 2019-10-26 ENCOUNTER — Emergency Department
Admission: EM | Admit: 2019-10-26 | Discharge: 2019-10-26 | Disposition: A | Discharge status: Home / Self Care | Payer: Medicare HMO | Attending: Emergency Medicine | Admitting: Emergency Medicine

## 2019-10-26 ENCOUNTER — Encounter: Payer: Self-pay | Admitting: Emergency Medicine

## 2019-10-26 DIAGNOSIS — Z87891 Personal history of nicotine dependence: Secondary | ICD-10-CM | POA: Insufficient documentation

## 2019-10-26 DIAGNOSIS — I10 Essential (primary) hypertension: Secondary | ICD-10-CM | POA: Insufficient documentation

## 2019-10-26 DIAGNOSIS — R040 Epistaxis: Secondary | ICD-10-CM | POA: Insufficient documentation

## 2019-10-26 LAB — COMPREHENSIVE METABOLIC PANEL
ALT: 36 U/L (ref 0–44)
AST: 30 U/L (ref 15–41)
Albumin: 4.3 g/dL (ref 3.5–5.0)
Alkaline Phosphatase: 51 U/L (ref 38–126)
Anion gap: 10 (ref 5–15)
BUN: 18 mg/dL (ref 8–23)
CO2: 27 mmol/L (ref 22–32)
Calcium: 9.4 mg/dL (ref 8.9–10.3)
Chloride: 104 mmol/L (ref 98–111)
Creatinine, Ser: 1.15 mg/dL (ref 0.61–1.24)
GFR calc Af Amer: >60 mL/min (ref >60)
GFR calc non Af Amer: >60 mL/min (ref >60)
Glucose, Bld: 130 mg/dL — ABNORMAL HIGH (ref 70–99)
Potassium: 4.4 mmol/L (ref 3.5–5.1)
Sodium: 141 mmol/L (ref 135–145)
Total Bilirubin: 0.6 mg/dL (ref 0.3–1.2)
Total Protein: 7 g/dL (ref 6.5–8.1)

## 2019-10-26 LAB — CBC WITH DIFFERENTIAL/PLATELET
Abs Immature Granulocytes: 0.02 10*3/uL (ref 0.00–0.07)
Basophils Absolute: 0 10*3/uL (ref 0.0–0.1)
Basophils Relative: 1 %
Eosinophils Absolute: 0.2 10*3/uL (ref 0.0–0.5)
Eosinophils Relative: 4 %
HCT: 38.3 % — ABNORMAL LOW (ref 39.0–52.0)
Hemoglobin: 13.3 g/dL (ref 13.0–17.0)
Immature Granulocytes: 0 %
Lymphocytes Relative: 28 %
Lymphs Abs: 1.4 10*3/uL (ref 0.7–4.0)
MCH: 35.3 pg — ABNORMAL HIGH (ref 26.0–34.0)
MCHC: 34.7 g/dL (ref 30.0–36.0)
MCV: 101.6 fL — ABNORMAL HIGH (ref 80.0–100.0)
Monocytes Absolute: 0.5 10*3/uL (ref 0.1–1.0)
Monocytes Relative: 10 %
Neutro Abs: 2.9 10*3/uL (ref 1.7–7.7)
Neutrophils Relative %: 57 %
Platelets: 168 10*3/uL (ref 150–400)
RBC: 3.77 MIL/uL — ABNORMAL LOW (ref 4.22–5.81)
RDW: 12.3 % (ref 11.5–15.5)
WBC: 5.1 10*3/uL (ref 4.0–10.5)
nRBC: 0 % (ref 0.0–0.2)

## 2019-10-26 LAB — PROTIME-INR
INR: 1 (ref 0.8–1.2)
Prothrombin Time: 13.4 seconds (ref 11.4–15.2)

## 2019-10-26 MED ORDER — LIDOCAINE VISCOUS HCL 2 % MT SOLN
15.0000 mL | Freq: Once | OROMUCOSAL | Status: AC
  Administered 2019-10-26: 15 mL via OROMUCOSAL
  Filled 2019-10-26: qty 15

## 2019-10-26 MED ORDER — SILVER NITRATE-POT NITRATE 75-25 % EX MISC
1.0000 "application " | Freq: Once | CUTANEOUS | Status: AC
  Administered 2019-10-26: 1 via TOPICAL
  Filled 2019-10-26: qty 1

## 2019-10-26 NOTE — ED Provider Notes (Signed)
[facility] Emergency Department Provider Note       Time seen: ----------------------------------------- 7:17 AM on 10/26/2019 -----------------------------------------   I have reviewed the triage vital signs and the nursing notes.  HISTORY   Chief Complaint Epistaxis    HPI Terry [name] is a 68 y.o. male with a history of diverticulosis, GERD, hyperlipidemia, hypertension, mitral valve prolapse who presents to the ED for epistaxis.  Patient reportedly bled on Friday did at an outside hospital, he remove the packing on Saturday.  Bleeding started again on Tuesday which she was able to get to stop, then bleeding started again today which he repacked.  He cannot tell if it is coming from the left or right sides.  Past Medical History:  Diagnosis Date  . Diverticulosis   . GERD (gastroesophageal reflux disease)   . Heart murmur   . Hypercholesteremia   . Hypertension   . Mitral valve prolapse     Patient Active Problem List   Diagnosis Date Noted  . Leg pain 09/27/2017  . 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 Venous insufficiency 09/27/2017  . GERD (gastroesophageal reflux disease) 09/27/2017    Past Surgical History:  Procedure Laterality Date  . RETINAL DETACHMENT SURGERY    . RIGHT/LEFT HEART CATH AND CORONARY ANGIOGRAPHY Bilateral 02/18/2017   Procedure: Right/Left Heart Cath and Coronary Angiography;  Surgeon: Marcina Millard, MD;  Location: [facility] INVASIVE CV LAB;  Service: Cardiovascular;  Laterality: Bilateral;  . TEMPOROMANDIBULAR JOINT SURGERY      Allergies Penicillins, Ciprofloxacin, and Ivp dye [iodinated diagnostic agents]  Social History Social History   Tobacco Use  . Smoking status: Former Smoker    Quit date: 1985    Years since quitting: 35.8  . Smokeless tobacco: Never Used  Substance Use Topics  . Alcohol use: Yes    Comment: ocassionally  . Drug use: No   Review of Systems Constitutional: Negative for  fever. HEENT: Positive for epistaxis Cardiovascular: Negative for chest pain. Respiratory: Negative for shortness of breath. Gastrointestinal: Negative for abdominal pain, vomiting and diarrhea. Musculoskeletal: Negative for back pain. Skin: Negative for rash. Neurological: Negative for headaches, focal weakness or numbness.  All systems negative/normal/unremarkable except as stated in the HPI  ____________________________________________   PHYSICAL EXAM:  VITAL SIGNS: ED Triage Vitals  Enc Vitals Group     BP 10/26/19 0642 (!) 164/101     Pulse Rate 10/26/19 0642 69     Resp --      Temp 10/26/19 0642 (!) 97.5 F (36.4 C)     Temp Source 10/26/19 0642 Oral     SpO2 10/26/19 0642 97 %     Weight 10/26/19 0639 181 lb (82.1 kg)     Height 10/26/19 0639 5\' 6"  (1.676 m)     Head Circumference --      Peak Flow --      Pain Score 10/26/19 0639 0     Pain Loc --      Pain Edu? --      Excl. in GC? --    Constitutional: Alert and oriented. Well appearing and in no distress. Eyes: Conjunctivae are normal. Normal extraocular movements. ENT      Head: Normocephalic and atraumatic.      Nose: Recent bleeding is noted, he does not appear to have much if any of a nasal septum.  There appears to have been some recent bleeding on the left lateral side anteriorly      Mouth/Throat: Mucous  membranes are moist.      Neck: No stridor. Cardiovascular: Normal rate, regular rhythm. No murmurs, rubs, or gallops. Respiratory: Normal respiratory effort without tachypnea nor retractions. Breath sounds are clear and equal bilaterally. No wheezes/rales/rhonchi. Musculoskeletal: Nontender with normal range of motion in extremities. No lower extremity tenderness nor edema. Skin:  Skin is warm, dry and intact. No rash noted. Psychiatric: Mood and affect are normal. Speech and behavior are normal.  ___________________________________________  ED COURSE:  As part of my medical decision making, I  reviewed the following data within the [place] History obtained from family if available, nursing notes, old chart and ekg, as well as notes from prior ED visits. Patient presented for epistaxis, we will assess with basic labs   .Epistaxis Management  Date/Time: 10/26/2019 7:26 AM Performed by: Earleen Newport, MD Authorized by: Earleen Newport, MD   Consent:    Consent obtained:  Verbal   Consent given by:  Patient Anesthesia (see MAR for exact dosages):    Anesthesia method:  Topical application   Topical anesthetic:  Lidocaine gel Procedure details:    Treatment site:  L anterior   Treatment method:  Silver nitrate   Treatment complexity:  Limited   Treatment episode: initial   Post-procedure details:    Assessment:  Bleeding stopped   Patient tolerance of procedure:  Tolerated well, no immediate complications    Terry [name] was evaluated in Emergency Department on 10/26/2019 for the symptoms described in the history of present illness. He was evaluated in the context of the global COVID-19 pandemic, which necessitated consideration that the patient might be at risk for infection with the SARS-CoV-2 virus that causes COVID-19. Institutional protocols and algorithms that pertain to the evaluation of patients at risk for COVID-19 are in a state of rapid change based on information released by regulatory bodies including the CDC and federal and state organizations. These policies and algorithms were followed during the patient's care in the ED.  ____________________________________________   LABS (pertinent positives/negatives)  Labs Reviewed  CBC WITH DIFFERENTIAL/PLATELET  COMPREHENSIVE METABOLIC PANEL  PROTIME-INR  ____________________________________________   DIFFERENTIAL DIAGNOSIS   Epistaxis, anemia, coagulopathy  FINAL ASSESSMENT AND PLAN  Epistaxis   Plan: The patient had presented for recurrent epistaxis. Patient's labs are  dictated as above, he has an appointment to be seen on Monday by ENT.  He is cleared for outpatient follow-up.   [name], MD    Note: This note was generated in part or whole with voice recognition software. Voice recognition is usually quite accurate but there are transcription errors that can and very often do occur. I apologize for any typographical errors that were not detected and corrected.     Earleen Newport, MD 10/26/19 [phone]

## 2019-10-26 NOTE — ED Triage Notes (Signed)
Patient ambulatory to triage with steady gait, without difficulty or distress noted, mask in place; pt reports seen Friday at [facility] while out of town for nosebleed; st packing placed which he removed on Saturday; 37min PTA began bleeding again and he packed it himself at home; pt denies hx of same; denies any recent illness, denies any accomp symptoms

## 2020-02-12 ENCOUNTER — Other Ambulatory Visit: Payer: Self-pay

## 2020-02-12 ENCOUNTER — Other Ambulatory Visit
Admission: RE | Admit: 2020-02-12 | Discharge: 2020-02-12 | Disposition: A | Discharge status: Home / Self Care | Payer: Medicare HMO | Source: Ambulatory Visit | Attending: Physician Assistant | Admitting: Physician Assistant

## 2020-02-12 DIAGNOSIS — Z01812 Encounter for preprocedural laboratory examination: Secondary | ICD-10-CM | POA: Insufficient documentation

## 2020-02-12 DIAGNOSIS — Z20822 Contact with and (suspected) exposure to covid-19: Secondary | ICD-10-CM | POA: Insufficient documentation

## 2020-02-13 LAB — SARS CORONAVIRUS 2 (TAT 6-24 HRS): SARS Coronavirus 2: NEGATIVE

## 2020-02-19 ENCOUNTER — Other Ambulatory Visit
Admission: RE | Admit: 2020-02-19 | Discharge: 2020-02-19 | Disposition: A | Discharge status: Home / Self Care | Payer: Medicare HMO | Source: Ambulatory Visit | Attending: Physician Assistant | Admitting: Physician Assistant

## 2020-02-19 ENCOUNTER — Other Ambulatory Visit: Payer: Self-pay

## 2020-02-19 DIAGNOSIS — Z20822 Contact with and (suspected) exposure to covid-19: Secondary | ICD-10-CM | POA: Insufficient documentation

## 2020-02-19 DIAGNOSIS — Z01812 Encounter for preprocedural laboratory examination: Secondary | ICD-10-CM | POA: Insufficient documentation

## 2020-02-19 LAB — SARS CORONAVIRUS 2 (TAT 6-24 HRS): SARS Coronavirus 2: NEGATIVE

## 2020-02-22 ENCOUNTER — Encounter: Payer: Self-pay | Admitting: Cardiology

## 2020-02-22 ENCOUNTER — Encounter
Admission: RE | Disposition: A | Discharge status: Home / Self Care | Payer: Self-pay | Source: Home / Self Care | Attending: Cardiology

## 2020-02-22 ENCOUNTER — Ambulatory Visit
Admission: RE | Admit: 2020-02-22 | Discharge: 2020-02-22 | Disposition: A | Discharge status: Home / Self Care | Payer: Medicare HMO | Attending: Cardiology | Admitting: Cardiology

## 2020-02-22 ENCOUNTER — Other Ambulatory Visit: Payer: Self-pay

## 2020-02-22 DIAGNOSIS — Z888 Allergy status to other drugs, medicaments and biological substances status: Secondary | ICD-10-CM | POA: Insufficient documentation

## 2020-02-22 DIAGNOSIS — Z79899 Other long term (current) drug therapy: Secondary | ICD-10-CM | POA: Diagnosis not present

## 2020-02-22 DIAGNOSIS — I10 Essential (primary) hypertension: Secondary | ICD-10-CM | POA: Diagnosis not present

## 2020-02-22 DIAGNOSIS — Z880 Allergy status to penicillin: Secondary | ICD-10-CM | POA: Diagnosis not present

## 2020-02-22 DIAGNOSIS — Z881 Allergy status to other antibiotic agents status: Secondary | ICD-10-CM | POA: Insufficient documentation

## 2020-02-22 DIAGNOSIS — Z91041 Radiographic dye allergy status: Secondary | ICD-10-CM | POA: Insufficient documentation

## 2020-02-22 DIAGNOSIS — R0602 Shortness of breath: Secondary | ICD-10-CM | POA: Diagnosis not present

## 2020-02-22 DIAGNOSIS — Z87891 Personal history of nicotine dependence: Secondary | ICD-10-CM | POA: Diagnosis not present

## 2020-02-22 DIAGNOSIS — K219 Gastro-esophageal reflux disease without esophagitis: Secondary | ICD-10-CM | POA: Insufficient documentation

## 2020-02-22 DIAGNOSIS — I2510 Atherosclerotic heart disease of native coronary artery without angina pectoris: Secondary | ICD-10-CM | POA: Insufficient documentation

## 2020-02-22 DIAGNOSIS — I051 Rheumatic mitral insufficiency: Secondary | ICD-10-CM | POA: Diagnosis not present

## 2020-02-22 DIAGNOSIS — E7849 Other hyperlipidemia: Secondary | ICD-10-CM | POA: Insufficient documentation

## 2020-02-22 HISTORY — PX: RIGHT/LEFT HEART CATH AND CORONARY ANGIOGRAPHY: CATH118266

## 2020-02-22 SURGERY — RIGHT/LEFT HEART CATH AND CORONARY ANGIOGRAPHY
Anesthesia: Moderate Sedation

## 2020-02-22 MED ORDER — ASPIRIN 81 MG PO CHEW
CHEWABLE_TABLET | ORAL | Status: AC
  Filled 2020-02-22: qty 1

## 2020-02-22 MED ORDER — HYDRALAZINE HCL 20 MG/ML IJ SOLN: 10.0000 mg | INTRAMUSCULAR | Status: DC | PRN

## 2020-02-22 MED ORDER — IOHEXOL 300 MG/ML  SOLN
INTRAMUSCULAR | Status: DC | PRN
  Administered 2020-02-22: 130 mL

## 2020-02-22 MED ORDER — SODIUM CHLORIDE 0.9 % IV SOLN: 250.0000 mL | INTRAVENOUS | Status: DC | PRN

## 2020-02-22 MED ORDER — SODIUM CHLORIDE 0.9% FLUSH: 3.0000 mL | Freq: Two times a day (BID) | INTRAVENOUS | Status: DC

## 2020-02-22 MED ORDER — ACETAMINOPHEN 325 MG PO TABS: 650.0000 mg | ORAL_TABLET | ORAL | Status: DC | PRN

## 2020-02-22 MED ORDER — LABETALOL HCL 5 MG/ML IV SOLN: 10.0000 mg | INTRAVENOUS | Status: DC | PRN

## 2020-02-22 MED ORDER — METHYLPREDNISOLONE SODIUM SUCC 125 MG IJ SOLR
125.0000 mg | Freq: Once | INTRAMUSCULAR | Status: AC
  Administered 2020-02-22: 125 mg via INTRAVENOUS

## 2020-02-22 MED ORDER — SODIUM CHLORIDE 0.9 % WEIGHT BASED INFUSION
247.8000 mL/h | INTRAVENOUS | Status: AC
  Administered 2020-02-22: 3 mL/kg/h via INTRAVENOUS

## 2020-02-22 MED ORDER — FAMOTIDINE 20 MG PO TABS
40.0000 mg | ORAL_TABLET | Freq: Once | ORAL | Status: AC
  Administered 2020-02-22: 40 mg via ORAL

## 2020-02-22 MED ORDER — SODIUM CHLORIDE 0.9% FLUSH: 3.0000 mL | INTRAVENOUS | Status: DC | PRN

## 2020-02-22 MED ORDER — HEPARIN (PORCINE) IN NACL 1000-0.9 UT/500ML-% IV SOLN
INTRAVENOUS | Status: DC | PRN
  Administered 2020-02-22: 500 mL

## 2020-02-22 MED ORDER — SODIUM CHLORIDE 0.9 % WEIGHT BASED INFUSION: 1.0000 mL/kg/h | INTRAVENOUS | Status: DC

## 2020-02-22 MED ORDER — METHYLPREDNISOLONE SODIUM SUCC 125 MG IJ SOLR
INTRAMUSCULAR | Status: AC
  Filled 2020-02-22: qty 2

## 2020-02-22 MED ORDER — DIPHENHYDRAMINE HCL 50 MG/ML IJ SOLN
INTRAMUSCULAR | Status: AC
  Filled 2020-02-22: qty 1

## 2020-02-22 MED ORDER — ASPIRIN 81 MG PO CHEW
81.0000 mg | CHEWABLE_TABLET | ORAL | Status: AC
  Administered 2020-02-22: 81 mg via ORAL

## 2020-02-22 MED ORDER — HEPARIN (PORCINE) IN NACL 1000-0.9 UT/500ML-% IV SOLN
INTRAVENOUS | Status: AC
  Filled 2020-02-22: qty 1000

## 2020-02-22 MED ORDER — FENTANYL CITRATE (PF) 100 MCG/2ML IJ SOLN
INTRAMUSCULAR | Status: AC
  Filled 2020-02-22: qty 2

## 2020-02-22 MED ORDER — ONDANSETRON HCL 4 MG/2ML IJ SOLN: 4.0000 mg | Freq: Four times a day (QID) | INTRAMUSCULAR | Status: DC | PRN

## 2020-02-22 MED ORDER — MIDAZOLAM HCL 2 MG/2ML IJ SOLN
INTRAMUSCULAR | Status: AC
  Filled 2020-02-22: qty 2

## 2020-02-22 MED ORDER — FAMOTIDINE 20 MG PO TABS
ORAL_TABLET | ORAL | Status: AC
  Filled 2020-02-22: qty 2

## 2020-02-22 MED ORDER — DIPHENHYDRAMINE HCL 50 MG/ML IJ SOLN
50.0000 mg | Freq: Once | INTRAMUSCULAR | Status: AC
  Administered 2020-02-22: 08:00:00 50 mg via INTRAVENOUS

## 2020-02-22 SURGICAL SUPPLY — 14 items
CATH INFINITI 5FR ANG PIGTAIL (CATHETERS) ×3 IMPLANT
CATH INFINITI 5FR JL4 (CATHETERS) ×3 IMPLANT
CATH INFINITI JR4 5F (CATHETERS) ×3 IMPLANT
CATH SWANZ 7F THERMO (CATHETERS) ×3 IMPLANT
DEVICE CLOSURE MYNXGRIP 5F (Vascular Products) ×3 IMPLANT
KIT MANI 3VAL PERCEP (MISCELLANEOUS) ×3 IMPLANT
KIT RIGHT HEART (MISCELLANEOUS) ×3 IMPLANT
NEEDLE PERC 18GX7CM (NEEDLE) ×3 IMPLANT
PACK CARDIAC CATH (CUSTOM PROCEDURE TRAY) ×3 IMPLANT
SHEATH AVANTI 5FR X 11CM (SHEATH) ×3 IMPLANT
SHEATH AVANTI 7FRX11 (SHEATH) ×3 IMPLANT
WIRE EMERALD 3MM-J .035X260CM (WIRE) ×3 IMPLANT
WIRE EMERALD ST .035X150CM (WIRE) ×3 IMPLANT
WIRE GUIDERIGHT .035X150 (WIRE) ×3 IMPLANT

## 2020-02-22 NOTE — Discharge Instructions (Signed)
Mitral Valve Regurgitation  Mitral valve regurgitation, also called mitral regurgitation, is a condition in which some blood leaks back (regurgitates) through the mitral valve in the heart. The mitral valve is located between the upper left chamber (left atrium) and the lower left chamber (left ventricle) of the heart. When blood travels through the heart, it goes from the left atrium to the left ventricle and then out to the body. Normally, the mitral valve opens when the atrium pumps blood into the ventricle, and it closes when the ventricle pumps blood out to the body. Mitral valve regurgitation happens when the mitral valve does not close properly. As a result, blood in the ventricle leaks back into the atrium. Mitral valve regurgitation causes the heart to work harder to pump blood. If the condition is mild, a person may not have symptoms. However, over time, this can lead to heart failure. What are the causes? This condition may be caused by:  A condition in which the mitral valve does not close completely when the heart pumps blood (mitral valve prolapse).  Heart valve infection (endocarditis).  Certain types of heart disease.  A condition that causes inflammation of the heart, blood vessels, or joints (rheumatic fever).  Certain conditions that are present at birth (congenital heart defect).  Previous radiation therapy to the chest area.  Damage to the mitral valve, such as from injury (trauma) to the heart or a heart attack.  Certain combinations of weight-loss (anti-obesity) medicines. What are the signs or symptoms? In some cases of mild to moderate mitral regurgitation, there are no symptoms. Symptoms of this condition include:  Shortness of breath.  Fatigue.  Activity intolerance.  Cough. Severe symptoms of this condition include:  Suddenly waking up at night with difficulty breathing.  Fast or irregular heartbeat.  Swelling in the lower legs, ankles, and  feet.  Fluid in the lungs that makes it very hard to breathe (pulmonary edema). How is this diagnosed? This condition may be diagnosed based on:  A physical exam. Your health care provider will listen to your heart for an abnormal heart sound (murmur).  Tests to confirm the diagnosis. These may include: ? A test that creates ultrasound images of the heart (echocardiogram). This test allows your health care provider to see how the heart valves work while your heart is beating. ? Chest X-ray. ? A test that records the electrical impulses of the heart (electrocardiogram, or ECG). ? A test that looks at the structure and function of the heart (cardiac catheterization). How is this treated? This condition may be treated with:  Medicines. These may be given to treat symptoms and prevent complications.  Surgery or catheter-based procedures to repair or replace the mitral valve. This may be done in severe, long-term (chronic) cases. Follow these instructions at home: Eating and drinking   Eat a heart-healthy diet that includes whole grains, fresh fruits and vegetables, low-fat (lean) proteins, and low-fat or nonfat dairy products. Consider working with a dietitian to help you make healthy food choices.  Limit how much salt (sodium) you eat as told by your health care provider. Follow instructions from your health care provider about any other eating or drinking restrictions, such as limiting foods that are high in fat and processed sugars.  Use healthy cooking methods, such as roasting, grilling, broiling, baking, poaching, steaming, or stir-frying. Alcohol use  Do not drink alcohol if: ? Your health care provider tells you not to drink. ? You are pregnant, may be pregnant,  or are planning to become pregnant.  If you drink alcohol: ? Limit how much you use to:  0-1 drink a day for women.  0-2 drinks a day for men. ? Be aware of how much alcohol is in your drink. In the [place] one drink  equals one 12 oz bottle of beer (355 mL), one 5 oz glass of wine (148 mL), or one 1 oz glass of hard liquor (44 mL). Activity  Return to your normal activities as told by your health care provider. Ask your health care provider what activities are safe for you.  Regular exercise is important for the health of your heart and for maintaining a healthy weight. Ask your health care provider what type of exercise is safe for you. You may need to avoid strenuous exercise. Lifestyle  Maintain a healthy weight.  Do not use any products that contain nicotine or tobacco, such as cigarettes, e-cigarettes, and chewing tobacco. If you need help quitting, ask your health care provider.  Find ways to manage stress. If you need help with this, ask your health care provider. General instructions  Take over-the-counter and prescription medicines only as told by your health care provider.  Work closely with your health care provider to manage any other health conditions you have, such as diabetes or high blood pressure.  If you plan to become pregnant, talk with your health care provider first.  Keep all follow-up visits as told by your health care provider. This is important. Contact a health care provider if you:  Have a fever.  Feel more tired than usual when doing physical activity.  Have a dry cough. Get help right away if you:  Have shortness of breath.  Develop chest pain.  Have swelling in your hands, feet, ankles, or abdomen that is getting worse.  Have trouble staying awake or you faint.  Feel dizzy or unsteady.  Suddenly gain weight.  Feel confused. These symptoms may represent a serious problem that is an emergency. Do not wait to see if the symptoms will go away. Get medical help right away. Call your local emergency services (911 in the [place] Do not drive yourself to the hospital. Summary  Mitral valve regurgitation, also called mitral regurgitation, is a condition in  which some blood leaks back (regurgitates) through the mitral valve in the heart.  Depending on how severe your condition is, you may be treated with medicines, catheter-based procedures, or surgery.  Practice heart-healthy habits to manage this condition. These include limiting alcohol, avoiding nicotine and tobacco, and eating a heart-healthy diet that is low in salt (sodium). This information is not intended to replace advice given to you by your health care provider. Make sure you discuss any questions you have with your health care provider. Document Revised: 11/27/2018 Document Reviewed: 11/27/2018 Elsevier Patient Education  2020 [employer]   Femoral Site Care This sheet gives you information about how to care for yourself after your procedure. Your health care provider may also give you more specific instructions. If you have problems or questions, contact your health care provider. What can I expect after the procedure? After the procedure, it is common to have:  Bruising that usually fades within 1-2 weeks.  Tenderness at the site. Follow these instructions at home: Wound care  Follow instructions from your health care provider about how to take care of your insertion site. Make sure you: ? Wash your hands with soap and water before you change your bandage (dressing). If  soap and water are not available, use hand sanitizer. ? Change your dressing as told by your health care provider. ? Leave stitches (sutures), skin glue, or adhesive strips in place. These skin closures may need to stay in place for 2 weeks or longer. If adhesive strip edges start to loosen and curl up, you may trim the loose edges. Do not remove adhesive strips completely unless your health care provider tells you to do that.  Do not take baths, swim, or use a hot tub until your health care provider approves.  You may shower 24-48 hours after the procedure or as told by your health care provider. ? Gently  wash the site with plain soap and water. ? Pat the area dry with a clean towel. ? Do not rub the site. This may cause bleeding.  Do not apply powder or lotion to the site. Keep the site clean and dry.  Check your femoral site every day for signs of infection. Check for: ? Redness, swelling, or pain. ? Fluid or blood. ? Warmth. ? Pus or a bad smell. Activity  For the first 2-3 days after your procedure, or as long as directed: ? Avoid climbing stairs as much as possible. ? Do not squat.  Do not lift anything that is heavier than 10 lb (4.5 kg), or the limit that you are told, until your health care provider says that it is safe.  Rest as directed. ? Avoid sitting for a long time without moving. Get up to take short walks every 1-2 hours.  Do not drive for 24 hours if you were given a medicine to help you relax (sedative). General instructions  Take over-the-counter and prescription medicines only as told by your health care provider.  Keep all follow-up visits as told by your health care provider. This is important. Contact a health care provider if you have:  A fever or chills.  You have redness, swelling, or pain around your insertion site. Get help right away if:  The catheter insertion area swells very fast.  You pass out.  You suddenly start to sweat or your skin gets clammy.  The catheter insertion area is bleeding, and the bleeding does not stop when you hold steady pressure on the area.  The area near or just beyond the catheter insertion site becomes pale, cool, tingly, or numb. These symptoms may represent a serious problem that is an emergency. Do not wait to see if the symptoms will go away. Get medical help right away. Call your local emergency services (911 in the [place] Do not drive yourself to the hospital. Summary  After the procedure, it is common to have bruising that usually fades within 1-2 weeks.  Check your femoral site every day for signs of  infection.  Do not lift anything that is heavier than 10 lb (4.5 kg), or the limit that you are told, until your health care provider says that it is safe. This information is not intended to replace advice given to you by your health care provider. Make sure you discuss any questions you have with your health care provider. Document Revised: 12/27/2017 Document Reviewed: 12/27/2017 Elsevier Patient Education  2020 [place] After This sheet gives you information about how to care for yourself after your procedure. Your doctor may also give you more specific instructions. If you have problems or questions, contact your doctor. Follow these instructions at home: Insertion site care  Follow instructions from your doctor  about how to take care of your long, thin tube (catheter) insertion area. Make sure you: ? Wash your hands with soap and water before you change your bandage (dressing). If you cannot use soap and water, use hand sanitizer. ? Change your bandage as told by your doctor. ? Leave stitches (sutures), skin glue, or skin tape (adhesive) strips in place. They may need to stay in place for 2 weeks or longer. If tape strips get loose and curl up, you may trim the loose edges. Do not remove tape strips completely unless your doctor says it is okay.  Do not take baths, swim, or use a hot tub until your doctor says it is okay.  You may shower 24-48 hours after the procedure or as told by your doctor. ? Gently wash the area with plain soap and water. ? Pat the area dry with a clean towel. ? Do not rub the area. This may cause bleeding.  Do not apply powder or lotion to the area. Keep the area clean and dry.  Check your insertion area every day for signs of infection. Check for: ? More redness, swelling, or pain. ? Fluid or blood. ? Warmth. ? Pus or a bad smell. Activity  Rest as told by your doctor, usually for 1-2 days.  Do not lift anything that is  heavier than 10 lbs. (4.5 kg) or as told by your doctor.  Do not drive for 24 hours if you were given a medicine to help you relax (sedative).  Do not drive or use heavy machinery while taking prescription pain medicine. General instructions   Go back to your normal activities as told by your doctor, usually in about a week. Ask your doctor what activities are safe for you.  If the insertion area starts to bleed, lie flat and put pressure on the area. If the bleeding does not stop, get help right away. This is an emergency.  Drink enough fluid to keep your pee (urine) clear or pale yellow.  Take over-the-counter and prescription medicines only as told by your doctor.  Keep all follow-up visits as told by your doctor. This is important. Contact a doctor if:  You have a fever.  You have chills.  You have more redness, swelling, or pain around your insertion area.  You have fluid or blood coming from your insertion area.  The insertion area feels warm to the touch.  You have pus or a bad smell coming from your insertion area.  You have more bruising around the insertion area.  Blood collects in the tissue around the insertion area (hematoma) that may be painful to the touch. Get help right away if:  You have a lot of pain in the insertion area.  The insertion area swells very fast.  The insertion area is bleeding, and the bleeding does not stop after holding steady pressure on the area.  The area near or just beyond the insertion area becomes pale, cool, tingly, or numb. These symptoms may be an emergency. Do not wait to see if the symptoms will go away. Get medical help right away. Call your local emergency services (911 in the [place] Do not drive yourself to the hospital. Summary  After the procedure, it is common to have bruising and tenderness at the long, thin tube insertion area.  After the procedure, it is important to rest and drink plenty of fluids.  Do not  take baths, swim, or use a hot tub until your doctor  says it is okay to do so. You may shower 24-48 hours after the procedure or as told by your doctor.  If the insertion area starts to bleed, lie flat and put pressure on the area. If the bleeding does not stop, get help right away. This is an emergency. This information is not intended to replace advice given to you by your health care provider. Make sure you discuss any questions you have with your health care provider. Document Revised: 11/26/2017 Document Reviewed: 12/08/2016 Elsevier Patient Education  2020 [employer].

## 2020-02-23 ENCOUNTER — Encounter: Payer: Self-pay | Admitting: Cardiology

## 2020-03-04 ENCOUNTER — Other Ambulatory Visit
Admission: RE | Admit: 2020-03-04 | Discharge: 2020-03-04 | Disposition: A | Discharge status: Home / Self Care | Payer: Medicare HMO | Source: Ambulatory Visit | Attending: Internal Medicine | Admitting: Internal Medicine

## 2020-03-04 DIAGNOSIS — Z01812 Encounter for preprocedural laboratory examination: Secondary | ICD-10-CM | POA: Diagnosis present

## 2020-03-04 DIAGNOSIS — Z20822 Contact with and (suspected) exposure to covid-19: Secondary | ICD-10-CM | POA: Diagnosis not present

## 2020-03-05 LAB — SARS CORONAVIRUS 2 (TAT 6-24 HRS): SARS Coronavirus 2: NEGATIVE

## 2020-03-06 ENCOUNTER — Encounter: Payer: Self-pay | Admitting: Internal Medicine

## 2020-03-06 ENCOUNTER — Ambulatory Visit: Payer: Medicare HMO | Admitting: Certified Registered Nurse Anesthetist

## 2020-03-06 ENCOUNTER — Ambulatory Visit
Admission: RE | Admit: 2020-03-06 | Discharge: 2020-03-06 | Disposition: A | Discharge status: Home / Self Care | Payer: Medicare HMO | Attending: Internal Medicine | Admitting: Internal Medicine

## 2020-03-06 ENCOUNTER — Encounter
Admission: RE | Disposition: A | Discharge status: Home / Self Care | Payer: Self-pay | Source: Home / Self Care | Attending: Internal Medicine

## 2020-03-06 ENCOUNTER — Other Ambulatory Visit: Payer: Self-pay

## 2020-03-06 DIAGNOSIS — E7800 Pure hypercholesterolemia, unspecified: Secondary | ICD-10-CM | POA: Insufficient documentation

## 2020-03-06 DIAGNOSIS — Z1211 Encounter for screening for malignant neoplasm of colon: Secondary | ICD-10-CM | POA: Diagnosis not present

## 2020-03-06 DIAGNOSIS — Z881 Allergy status to other antibiotic agents status: Secondary | ICD-10-CM | POA: Insufficient documentation

## 2020-03-06 DIAGNOSIS — I341 Nonrheumatic mitral (valve) prolapse: Secondary | ICD-10-CM | POA: Diagnosis not present

## 2020-03-06 DIAGNOSIS — K640 First degree hemorrhoids: Secondary | ICD-10-CM | POA: Insufficient documentation

## 2020-03-06 DIAGNOSIS — K5730 Diverticulosis of large intestine without perforation or abscess without bleeding: Secondary | ICD-10-CM | POA: Insufficient documentation

## 2020-03-06 DIAGNOSIS — Z91041 Radiographic dye allergy status: Secondary | ICD-10-CM | POA: Diagnosis not present

## 2020-03-06 DIAGNOSIS — Z880 Allergy status to penicillin: Secondary | ICD-10-CM | POA: Insufficient documentation

## 2020-03-06 DIAGNOSIS — I10 Essential (primary) hypertension: Secondary | ICD-10-CM | POA: Diagnosis not present

## 2020-03-06 DIAGNOSIS — Z79899 Other long term (current) drug therapy: Secondary | ICD-10-CM | POA: Diagnosis not present

## 2020-03-06 DIAGNOSIS — Z86010 Personal history of colonic polyps: Secondary | ICD-10-CM | POA: Insufficient documentation

## 2020-03-06 DIAGNOSIS — K219 Gastro-esophageal reflux disease without esophagitis: Secondary | ICD-10-CM | POA: Diagnosis not present

## 2020-03-06 HISTORY — PX: COLONOSCOPY WITH PROPOFOL: SHX5780

## 2020-03-06 SURGERY — COLONOSCOPY WITH PROPOFOL
Anesthesia: General

## 2020-03-06 MED ORDER — EPHEDRINE SULFATE 50 MG/ML IJ SOLN
INTRAMUSCULAR | Status: DC | PRN
  Administered 2020-03-06: 10 mg via INTRAVENOUS

## 2020-03-06 MED ORDER — PROPOFOL 500 MG/50ML IV EMUL
INTRAVENOUS | Status: DC | PRN
  Administered 2020-03-06: 100 ug/kg/min via INTRAVENOUS
  Administered 2020-03-06: 75 mg via INTRAVENOUS

## 2020-03-06 MED ORDER — PROPOFOL 10 MG/ML IV BOLUS
INTRAVENOUS | Status: AC
  Filled 2020-03-06: qty 20

## 2020-03-06 MED ORDER — GLYCOPYRROLATE 0.2 MG/ML IJ SOLN
INTRAMUSCULAR | Status: DC | PRN
  Administered 2020-03-06: .2 mg via INTRAVENOUS

## 2020-03-06 MED ORDER — SODIUM CHLORIDE 0.9 % IV SOLN: INTRAVENOUS | Status: DC

## 2020-03-06 MED ORDER — LIDOCAINE HCL (CARDIAC) PF 100 MG/5ML IV SOSY
PREFILLED_SYRINGE | INTRAVENOUS | Status: DC | PRN
  Administered 2020-03-06: 60 mg via INTRAVENOUS

## 2020-03-06 MED ORDER — PROPOFOL 10 MG/ML IV BOLUS
INTRAVENOUS | Status: AC
  Filled 2020-03-06: qty 40

## 2020-03-06 NOTE — Anesthesia Postprocedure Evaluation (Signed)
Anesthesia Post Note  Patient: Terry [name]  Procedure(s) Performed: COLONOSCOPY WITH PROPOFOL (N/A )  Patient location during evaluation: Endoscopy Anesthesia Type: General Level of consciousness: awake and alert Pain management: pain level controlled Vital Signs Assessment: post-procedure vital signs reviewed and stable Respiratory status: spontaneous breathing and respiratory function stable Cardiovascular status: stable Anesthetic complications: no     Last Vitals:  Vitals:   03/06/20 0836 03/06/20 0838  BP: (!) 101/54 105/68  Pulse: 65 64  Resp: 17   Temp: (!) 35.9 C   SpO2: 95%     Last Pain:  Vitals:   03/06/20 0836  TempSrc: Temporal  PainSc: 0-No pain                 Carigan Lister K

## 2020-03-06 NOTE — Op Note (Signed)
[facility] Patient Name: Terry [name] Procedure Date: 03/06/2020 8:16 AM MRN: [id number] Account #: [id number] Date of Birth: 1951-07-20 Admit Type: Outpatient Age: 69 Room: [facility] ENDO ROOM 3 Gender: Male Note Status: Finalized Procedure:             Colonoscopy Indications:           High risk colon cancer surveillance: Personal history                         of multiple (3 or more) adenomas Providers:             Royce Macadamia K. Noga Fogg MD, MD Medicines:             Propofol per Anesthesia Complications:         No immediate complications. Procedure:             Pre-Anesthesia Assessment:                        - The risks and benefits of the procedure and the                         sedation options and risks were discussed with the                         patient. All questions were answered and informed                         consent was obtained.                        - The risks and benefits of the procedure and the                         sedation options and risks were discussed with the                         patient. All questions were answered and informed                         consent was obtained.                        - Patient identification and proposed procedure were                         verified prior to the procedure by the nurse. The                         procedure was verified in the procedure room.                        - ASA Grade Assessment: III - A patient with severe                         systemic disease.                        - After reviewing the risks and benefits, the patient  was deemed in satisfactory condition to undergo the                         procedure.                        After obtaining informed consent, the colonoscope was                         passed under direct vision. Throughout the procedure,                         the patient's blood pressure, pulse, and  oxygen                         saturations were monitored continuously. The                         Colonoscope was introduced through the anus and                         advanced to the the cecum, identified by appendiceal                         orifice and ileocecal valve. The colonoscopy was                         performed without difficulty. The patient tolerated                         the procedure well. The quality of the bowel                         preparation was adequate. The ileocecal valve,                         appendiceal orifice, and rectum were photographed. Findings:      The perianal and digital rectal examinations were normal. Pertinent       negatives include normal sphincter tone and no palpable rectal lesions.      Internal hemorrhoids were found during retroflexion. The hemorrhoids       were Grade I (internal hemorrhoids that do not prolapse).      Multiple small and large-mouthed diverticula were found in the sigmoid       colon.      The exam was otherwise without abnormality. Impression:            - Internal hemorrhoids.                        - Diverticulosis in the sigmoid colon.                        - The examination was otherwise normal.                        - No specimens collected. Recommendation:        - Patient has a contact number available for  emergencies. The signs and symptoms of potential                         delayed complications were discussed with the patient.                         Return to normal activities tomorrow. Written                         discharge instructions were provided to the patient.                        - Resume previous diet.                        - Continue present medications.                        - Repeat colonoscopy in 5 years for surveillance.                        - Return to GI office PRN.                        - The findings and recommendations were discussed with                          the patient. Procedure Code(s):     --- Professional ---                        D4287, Colorectal cancer screening; colonoscopy on                         individual at high risk Diagnosis Code(s):     --- Professional ---                        K57.30, Diverticulosis of large intestine without                         perforation or abscess without bleeding                        K64.0, First degree hemorrhoids                        Z86.010, Personal history of colonic polyps CPT copyright 2019 [employer]. All rights reserved. The codes documented in this report are preliminary and upon coder review may  be revised to meet current compliance requirements. Stanton Kidney MD, MD 03/06/2020 8:35:54 AM This report has been signed electronically. Number of Addenda: 0 Note Initiated On: 03/06/2020 8:16 AM Scope Withdrawal Time: 0 hours 6 minutes 23 seconds  Total Procedure Duration: 0 hours 10 minutes 19 seconds  Estimated Blood Loss:  Estimated blood loss: none.      [facility]

## 2020-03-06 NOTE — H&P (Signed)
Outpatient short stay form Pre-procedure 03/06/2020 8:16 AM Annette Liotta K. Norma Fredrickson, M.D.  Primary Physician: Marcelino Duster, M.D.  Reason for visit:  Personal hx of multiple (>3) adenomatous polyps of the colon - 2015 colonoscopy.  HPI:                            Patient presents for colonoscopy for a personal hx of colon polyps. The patient denies abdominal pain, abnormal weight loss or rectal bleeding.       Current Facility-Administered Medications:  .  0.9 %  sodium chloride infusion, , Intravenous, Continuous, [place], Boykin Nearing, MD, Last Rate: 20 mL/hr at 03/06/20 0733, New Bag at 03/06/20 0733  Medications Prior to Admission  Medication Sig Dispense Refill Last Dose  . omeprazole (PRILOSEC) 20 MG capsule Take 20 mg by mouth every evening.    03/05/2020 at Unknown time  . B Complex Vitamins (B COMPLEX 100 PO) Take 1 capsule by mouth every evening.   03/03/2020  . clobetasol ointment (TEMOVATE) 0.05 % Apply 1 application topically daily as needed (skin irritation).    03/03/2020  . Multiple Vitamin (MULTIVITAMIN WITH MINERALS) TABS tablet Take 1 tablet by mouth at bedtime.   03/03/2020  . Probiotic Product (PROBIOTIC PO) Take 1 capsule by mouth at bedtime.    03/03/2020  . triamcinolone cream (KENALOG) 0.1 % Apply 1 application topically daily as needed (skin irritation.).    03/03/2020  . valACYclovir (VALTREX) 500 MG tablet Take 500 mg by mouth every evening.    03/03/2020     Allergies  Allergen Reactions  . Penicillins Shortness Of Breath    Did it involve swelling of the face/tongue/throat, SOB, or low BP? Yes Did it involve sudden or severe rash/hives, skin peeling, or any reaction on the inside of your mouth or nose? Unknown Did you need to seek medical attention at a hospital or doctor's office? Was at a clinic when reaction occurred When did it last happen?More than 50 years ago If all above answers are "NO", may proceed with cephalosporin use.   . Ciprofloxacin Swelling and Rash  .  Ivp Dye [Iodinated Diagnostic Agents] Rash     Past Medical History:  Diagnosis Date  . Diverticulosis   . GERD (gastroesophageal reflux disease)   . Heart murmur   . Hypercholesteremia   . Hypertension   . Mitral valve prolapse     Review of systems:  Otherwise negative.    Physical Exam  Gen: Alert, oriented. Appears stated age.  HEENT: [place]/AT. PERRLA. Lungs: CTA, no wheezes. CV: RR nl S1, S2. Abd: soft, benign, no masses. BS+ Ext: No edema. Pulses 2+    Planned procedures: Proceed with colonoscopy. The patient understands the nature of the planned procedure, indications, risks, alternatives and potential complications including but not limited to bleeding, infection, perforation, damage to internal organs and possible oversedation/side effects from anesthesia. The patient agrees and gives consent to proceed.  Please refer to procedure notes for findings, recommendations and patient disposition/instructions.     Kayliee Atienza K. Norma Fredrickson, M.D. Gastroenterology 03/06/2020  8:16 AM

## 2020-03-06 NOTE — Interval H&P Note (Signed)
History and Physical Interval Note:  03/06/2020 8:17 AM  Terry [name]  has presented today for surgery, with the diagnosis of ph cp.  The various methods of treatment have been discussed with the patient and family. After consideration of risks, benefits and other options for treatment, the patient has consented to  Procedure(s): COLONOSCOPY WITH PROPOFOL (N/A) as a surgical intervention.  The patient's history has been reviewed, patient examined, no change in status, stable for surgery.  I have reviewed the patient's chart and labs.  Questions were answered to the patient's satisfaction.     [place], [place]

## 2020-03-06 NOTE — Transfer of Care (Signed)
Immediate Anesthesia Transfer of Care Note  Patient: Terry [name]  Procedure(s) Performed: COLONOSCOPY WITH PROPOFOL (N/A )  Patient Location: PACU  Anesthesia Type:General  Level of Consciousness: awake  Airway & Oxygen Therapy: Patient Spontanous Breathing  Post-op Assessment: Report given to RN  Post vital signs: Reviewed and stable  Last Vitals:  Vitals Value Taken Time  BP 105/68 03/06/20 0838  Temp    Pulse 64 03/06/20 0838  Resp    SpO2      Last Pain:  Vitals:   03/06/20 0713  TempSrc: Temporal  PainSc: 0-No pain         Complications: No apparent anesthesia complications

## 2020-03-06 NOTE — Anesthesia Preprocedure Evaluation (Signed)
Anesthesia Evaluation  Patient identified by MRN, date of birth, ID band Patient awake    Reviewed: Allergy & Precautions, NPO status , Patient's Chart, lab work & pertinent test results  Airway Mallampati: II       Dental  (+) Upper Dentures, Lower Dentures   Pulmonary neg sleep apnea, neg COPD, Not current smoker, former smoker,           Cardiovascular hypertension, Pt. on medications (-) CAD, (-) Past MI and (-) CHF (-) dysrhythmias + Valvular Problems/Murmurs (murmur) MVP      Neuro/Psych neg Seizures    GI/Hepatic Neg liver ROS, GERD  Medicated and Controlled,  Endo/Other  neg diabetes  Renal/GU negative Renal ROS     Musculoskeletal   Abdominal   Peds  Hematology   Anesthesia Other Findings   Reproductive/Obstetrics                            Anesthesia Physical Anesthesia Plan  ASA: III  Anesthesia Plan: General   Post-op Pain Management:    Induction: Intravenous  PONV Risk Score and Plan: 2  Airway Management Planned: Nasal Cannula  Additional Equipment:   Intra-op Plan:   Post-operative Plan:   Informed Consent: I have reviewed the patients History and Physical, chart, labs and discussed the procedure including the risks, benefits and alternatives for the proposed anesthesia with the patient or authorized representative who has indicated his/her understanding and acceptance.       Plan Discussed with:   Anesthesia Plan Comments:         Anesthesia Quick Evaluation

## 2020-03-07 ENCOUNTER — Encounter: Payer: Self-pay | Admitting: *Deleted

## 2020-04-01 ENCOUNTER — Other Ambulatory Visit: Payer: Self-pay

## 2020-04-01 ENCOUNTER — Emergency Department
Admission: EM | Admit: 2020-04-01 | Discharge: 2020-04-01 | Disposition: A | Discharge status: Home / Self Care | Payer: Medicare HMO | Attending: Emergency Medicine | Admitting: Emergency Medicine

## 2020-04-01 DIAGNOSIS — R040 Epistaxis: Secondary | ICD-10-CM | POA: Insufficient documentation

## 2020-04-01 DIAGNOSIS — I10 Essential (primary) hypertension: Secondary | ICD-10-CM | POA: Diagnosis not present

## 2020-04-01 MED ORDER — OXYMETAZOLINE HCL 0.05 % NA SOLN
1.0000 | Freq: Once | NASAL | Status: AC
  Administered 2020-04-01: 1 via NASAL
  Filled 2020-04-01: qty 30

## 2020-04-01 NOTE — ED Provider Notes (Signed)
[facility] Emergency Department Provider Note   ____________________________________________   First MD Initiated Contact with Patient 04/01/20 [phone]     (approximate)  I have reviewed the triage vital signs and the nursing notes.   HISTORY  Chief Complaint Epistaxis    HPI Terry [name] is a 69 y.o. male with past medical history of hypertension, hyperlipidemia, and mitral valve prolapse who presents to the ED complaining of epistaxis.  Patient reports that he woke up this morning with bleeding from both sides of his nose.  He attempted to stop this with pressure as well as packing his nose with tissue, but when he removed the tissue he had a large clot come out and continued to have bleeding.  He repacked his nose with tissue on both sides and now feels like the bleeding has stopped.  He reports similar episodes last fall and had followed up with ENT, with no clear source of bleeding identified.  He does not take any blood thinners and has otherwise been feeling well.        Past Medical History:  Diagnosis Date  . Diverticulosis   . GERD (gastroesophageal reflux disease)   . Heart murmur   . Hypercholesteremia   . Hypertension   . Mitral valve prolapse     Patient Active Problem List   Diagnosis Date Noted  . Leg pain 09/27/2017  . 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 Venous insufficiency 09/27/2017  . GERD (gastroesophageal reflux disease) 09/27/2017    Past Surgical History:  Procedure Laterality Date  . COLONOSCOPY WITH PROPOFOL N/A 03/06/2020   Procedure: COLONOSCOPY WITH PROPOFOL;  Surgeon: Toledo, Benay Pike, MD;  Location: [facility] ENDOSCOPY;  Service: Gastroenterology;  Laterality: N/A;  . RETINAL DETACHMENT SURGERY    . RIGHT/LEFT HEART CATH AND CORONARY ANGIOGRAPHY Bilateral 02/18/2017   Procedure: Right/Left Heart Cath and Coronary Angiography;  Surgeon: Isaias Cowman, MD;  Location: [place] [facility];  Service: Cardiovascular;   Laterality: Bilateral;  . RIGHT/LEFT HEART CATH AND CORONARY ANGIOGRAPHY N/A 02/22/2020   Procedure: RIGHT/LEFT HEART CATH AND CORONARY ANGIOGRAPHY;  Surgeon: Isaias Cowman, MD;  Location: [place] [facility];  Service: Cardiovascular;  Laterality: N/A;  . TEMPOROMANDIBULAR JOINT SURGERY      Prior to Admission medications   Medication Sig Start Date End Date Taking? Authorizing Provider  B Complex Vitamins (B COMPLEX 100 PO) Take 1 capsule by mouth every evening.    [provider]  clobetasol ointment (TEMOVATE) 2.77 % Apply 1 application topically daily as needed (skin irritation).  01/07/17   [provider]  Multiple Vitamin (MULTIVITAMIN WITH MINERALS) TABS tablet Take 1 tablet by mouth at bedtime.    [provider]  omeprazole (PRILOSEC) 20 MG capsule Take 20 mg by mouth every evening.  01/11/17   [provider]  Probiotic Product (PROBIOTIC PO) Take 1 capsule by mouth at bedtime.     [provider]  triamcinolone cream (KENALOG) 0.1 % Apply 1 application topically daily as needed (skin irritation.).  01/11/17   [provider]  valACYclovir (VALTREX) 500 MG tablet Take 500 mg by mouth every evening.  01/11/17   [provider]    Allergies Penicillins, Ciprofloxacin, and Ivp dye [iodinated diagnostic agents]  Family History  Problem Relation Age of Onset  . Varicose Veins Mother   . Cancer Father     Social History Social History   Tobacco Use  . Smoking status: Former Smoker  Quit date: 51    Years since quitting: 36.2  . Smokeless tobacco: Never Used  Substance Use Topics  . Alcohol use: Yes    Comment: ocassionally  . Drug use: No    Review of Systems  Constitutional: No fever/chills Eyes: No visual changes. ENT: No sore throat.  Positive for epistaxis. Cardiovascular: Denies chest pain. Respiratory: Denies shortness of breath. Gastrointestinal: No abdominal pain.  No nausea, no  vomiting.  No diarrhea.  No constipation. Genitourinary: Negative for dysuria. Musculoskeletal: Negative for back pain. Skin: Negative for rash. Neurological: Negative for headaches, focal weakness or numbness.  ____________________________________________   PHYSICAL EXAM:  VITAL SIGNS: ED Triage Vitals [04/01/20 0906]  Enc Vitals Group     BP (!) 191/109     Pulse Rate 71     Resp 19     Temp 98 F (36.7 C)     Temp src      SpO2 100 %     Weight 185 lb (83.9 kg)     Height 5\' 6"  (1.676 m)     Head Circumference      Peak Flow      Pain Score 0     Pain Loc      Pain Edu?      Excl. in GC?     Constitutional: Alert and oriented. Eyes: Conjunctivae are normal. Head: Atraumatic. Nose: No congestion/rhinnorhea.  Tissue packing to nares bilaterally with dried blood but no evidence of active bleeding. Mouth/Throat: Mucous membranes are moist. Neck: Normal ROM Cardiovascular: Normal rate, regular rhythm. Grossly normal heart sounds. Respiratory: Normal respiratory effort.  No retractions. Lungs CTAB. Gastrointestinal: Soft and nontender. No distention. Genitourinary: deferred Musculoskeletal: No lower extremity tenderness nor edema. Neurologic:  Normal speech and language. No gross focal neurologic deficits are appreciated. Skin:  Skin is warm, dry and intact. No rash noted. Psychiatric: Mood and affect are normal. Speech and behavior are normal.  ____________________________________________   LABS (all labs ordered are listed, but only abnormal results are displayed)  Labs Reviewed - No data to display   PROCEDURES  Procedure(s) performed (including Critical Care):  .Epistaxis Management  Date/Time: 04/01/2020 10:11 AM Performed by: 06/01/2020, MD Authorized by: Chesley Noon, MD   Consent:    Consent obtained:  Verbal   Consent given by:  Patient   Risks discussed:  Bleeding and nasal injury Anesthesia (see MAR for exact dosages):    Anesthesia  method:  None Procedure details:    Treatment site:  R anterior   Repair method: Afrin and compression.   Treatment complexity:  Limited   Treatment episode: recurring   Post-procedure details:    Assessment:  Bleeding stopped   Patient tolerance of procedure:  Tolerated well, no immediate complications     ____________________________________________   INITIAL IMPRESSION / ASSESSMENT AND PLAN / ED COURSE       69 year old male presents to the ED with epistaxis from bilateral nares since waking up this morning.  He is not anticoagulated and bleeding seems to have stopped following patient's placement of tissue to both of his nares.  Tissue was removed with no active bleeding noted, dose of aspirin given and we will continue to monitor for any recurrence.  Doubt significant blood loss and patient denies any symptoms of anemia.  No recurrent epistaxis on reevaluation of patient.  At this time, he is appropriate for discharge home with ENT follow-up.  I have also counseled him to follow-up with his PCP regarding  his elevated blood pressure.  He was provided with Afrin and clamp to use as needed for bleeding and counseled to return to the ED if this is not effective.  Patient agrees with plan.      ____________________________________________   FINAL CLINICAL IMPRESSION(S) / ED DIAGNOSES  Final diagnoses:  Anterior epistaxis     ED Discharge Orders    None       Note:  This document was prepared using Dragon voice recognition software and may include unintentional dictation errors.   Chesley Noon, MD 04/01/20 1115

## 2020-04-01 NOTE — ED Triage Notes (Signed)
Pt comes via POV from home with c/o epitaxis. Pt states this started this am. Pt has tissue placed inside both nostrils with bleeding controlled.  Pt denies any blood thinners. Pt states hx of this and had to have it cauterized before.

## 2020-05-07 ENCOUNTER — Emergency Department: Payer: Medicare HMO

## 2020-05-07 ENCOUNTER — Observation Stay: Payer: Medicare HMO

## 2020-05-07 ENCOUNTER — Inpatient Hospital Stay
Admission: EM | Admit: 2020-05-07 | Discharge: 2020-05-09 | DRG: 066 | Disposition: A | Discharge status: Home / Self Care | Payer: Medicare HMO | Attending: Internal Medicine | Admitting: Internal Medicine

## 2020-05-07 ENCOUNTER — Other Ambulatory Visit: Payer: Self-pay

## 2020-05-07 DIAGNOSIS — I129 Hypertensive chronic kidney disease with stage 1 through stage 4 chronic kidney disease, or unspecified chronic kidney disease: Secondary | ICD-10-CM | POA: Diagnosis present

## 2020-05-07 DIAGNOSIS — Z952 Presence of prosthetic heart valve: Secondary | ICD-10-CM

## 2020-05-07 DIAGNOSIS — I639 Cerebral infarction, unspecified: Secondary | ICD-10-CM | POA: Diagnosis not present

## 2020-05-07 DIAGNOSIS — R4701 Aphasia: Secondary | ICD-10-CM | POA: Diagnosis present

## 2020-05-07 DIAGNOSIS — Z880 Allergy status to penicillin: Secondary | ICD-10-CM

## 2020-05-07 DIAGNOSIS — K219 Gastro-esophageal reflux disease without esophagitis: Secondary | ICD-10-CM | POA: Diagnosis present

## 2020-05-07 DIAGNOSIS — R4781 Slurred speech: Secondary | ICD-10-CM

## 2020-05-07 DIAGNOSIS — E7800 Pure hypercholesterolemia, unspecified: Secondary | ICD-10-CM | POA: Diagnosis present

## 2020-05-07 DIAGNOSIS — I10 Essential (primary) hypertension: Secondary | ICD-10-CM | POA: Diagnosis not present

## 2020-05-07 DIAGNOSIS — Z79899 Other long term (current) drug therapy: Secondary | ICD-10-CM

## 2020-05-07 DIAGNOSIS — R29701 NIHSS score 1: Secondary | ICD-10-CM | POA: Diagnosis present

## 2020-05-07 DIAGNOSIS — G459 Transient cerebral ischemic attack, unspecified: Secondary | ICD-10-CM | POA: Diagnosis not present

## 2020-05-07 DIAGNOSIS — Z87891 Personal history of nicotine dependence: Secondary | ICD-10-CM

## 2020-05-07 DIAGNOSIS — H9190 Unspecified hearing loss, unspecified ear: Secondary | ICD-10-CM | POA: Diagnosis present

## 2020-05-07 DIAGNOSIS — N183 Chronic kidney disease, stage 3 unspecified: Secondary | ICD-10-CM | POA: Diagnosis present

## 2020-05-07 DIAGNOSIS — Z881 Allergy status to other antibiotic agents status: Secondary | ICD-10-CM

## 2020-05-07 DIAGNOSIS — I480 Paroxysmal atrial fibrillation: Secondary | ICD-10-CM | POA: Diagnosis present

## 2020-05-07 DIAGNOSIS — Z91041 Radiographic dye allergy status: Secondary | ICD-10-CM

## 2020-05-07 DIAGNOSIS — Z20822 Contact with and (suspected) exposure to covid-19: Secondary | ICD-10-CM | POA: Diagnosis present

## 2020-05-07 LAB — CBC
HCT: 39.2 % (ref 39.0–52.0)
Hemoglobin: 13.7 g/dL (ref 13.0–17.0)
MCH: 33.3 pg (ref 26.0–34.0)
MCHC: 34.9 g/dL (ref 30.0–36.0)
MCV: 95.4 fL (ref 80.0–100.0)
Platelets: 202 10*3/uL (ref 150–400)
RBC: 4.11 MIL/uL — ABNORMAL LOW (ref 4.22–5.81)
RDW: 12.7 % (ref 11.5–15.5)
WBC: 4.7 10*3/uL (ref 4.0–10.5)
nRBC: 0 % (ref 0.0–0.2)

## 2020-05-07 LAB — DIFFERENTIAL
Abs Immature Granulocytes: 0.02 10*3/uL (ref 0.00–0.07)
Basophils Absolute: 0 10*3/uL (ref 0.0–0.1)
Basophils Relative: 1 %
Eosinophils Absolute: 0.5 10*3/uL (ref 0.0–0.5)
Eosinophils Relative: 12 %
Immature Granulocytes: 0 %
Lymphocytes Relative: 25 %
Lymphs Abs: 1.2 10*3/uL (ref 0.7–4.0)
Monocytes Absolute: 0.4 10*3/uL (ref 0.1–1.0)
Monocytes Relative: 9 %
Neutro Abs: 2.5 10*3/uL (ref 1.7–7.7)
Neutrophils Relative %: 53 %

## 2020-05-07 LAB — COMPREHENSIVE METABOLIC PANEL
ALT: 22 U/L (ref 0–44)
AST: 21 U/L (ref 15–41)
Albumin: 3.9 g/dL (ref 3.5–5.0)
Alkaline Phosphatase: 74 U/L (ref 38–126)
Anion gap: 9 (ref 5–15)
BUN: 13 mg/dL (ref 8–23)
CO2: 25 mmol/L (ref 22–32)
Calcium: 9.6 mg/dL (ref 8.9–10.3)
Chloride: 105 mmol/L (ref 98–111)
Creatinine, Ser: 1.37 mg/dL — ABNORMAL HIGH (ref 0.61–1.24)
GFR calc Af Amer: >60 mL/min (ref >60)
GFR calc non Af Amer: 52 mL/min — ABNORMAL LOW (ref >60)
Glucose, Bld: 134 mg/dL — ABNORMAL HIGH (ref 70–99)
Potassium: 3.9 mmol/L (ref 3.5–5.1)
Sodium: 139 mmol/L (ref 135–145)
Total Bilirubin: 0.7 mg/dL (ref 0.3–1.2)
Total Protein: 7.5 g/dL (ref 6.5–8.1)

## 2020-05-07 LAB — SARS CORONAVIRUS 2 BY RT PCR (HOSPITAL ORDER, PERFORMED IN ~~LOC~~ HOSPITAL LAB): SARS Coronavirus 2: NEGATIVE

## 2020-05-07 LAB — PROTIME-INR
INR: 1.1 (ref 0.8–1.2)
Prothrombin Time: 13.4 seconds (ref 11.4–15.2)

## 2020-05-07 LAB — GLUCOSE, CAPILLARY: Glucose-Capillary: 133 mg/dL — ABNORMAL HIGH (ref 70–99)

## 2020-05-07 LAB — APTT: aPTT: 31 seconds (ref 24–36)

## 2020-05-07 MED ORDER — CLOPIDOGREL BISULFATE 75 MG PO TABS
150.0000 mg | ORAL_TABLET | Freq: Once | ORAL | Status: AC
  Administered 2020-05-07: 150 mg via ORAL
  Filled 2020-05-07: qty 2

## 2020-05-07 MED ORDER — AMIODARONE HCL 200 MG PO TABS
200.0000 mg | ORAL_TABLET | Freq: Every day | ORAL | Status: DC
  Filled 2020-05-07: qty 1

## 2020-05-07 MED ORDER — ENOXAPARIN SODIUM 40 MG/0.4ML ~~LOC~~ SOLN
40.0000 mg | SUBCUTANEOUS | Status: DC
  Administered 2020-05-07: 21:00:00 40 mg via SUBCUTANEOUS
  Filled 2020-05-07: qty 0.4

## 2020-05-07 MED ORDER — ADULT MULTIVITAMIN W/MINERALS CH
1.0000 | ORAL_TABLET | Freq: Every morning | ORAL | Status: DC
  Administered 2020-05-08 – 2020-05-09 (×2): 1 via ORAL
  Filled 2020-05-07 (×2): qty 1

## 2020-05-07 MED ORDER — CLOPIDOGREL BISULFATE 75 MG PO TABS
75.0000 mg | ORAL_TABLET | Freq: Once | ORAL | Status: AC
  Administered 2020-05-08: 75 mg via ORAL
  Filled 2020-05-07: qty 1

## 2020-05-07 MED ORDER — SODIUM CHLORIDE 0.9% FLUSH: 3.0000 mL | Freq: Once | INTRAVENOUS | Status: DC

## 2020-05-07 MED ORDER — ASPIRIN EC 81 MG PO TBEC
162.0000 mg | DELAYED_RELEASE_TABLET | Freq: Once | ORAL | Status: AC
  Administered 2020-05-07: 162 mg via ORAL
  Filled 2020-05-07: qty 2

## 2020-05-07 MED ORDER — ACETAMINOPHEN 160 MG/5ML PO SOLN
650.0000 mg | ORAL | Status: DC | PRN
  Filled 2020-05-07: qty 20.3

## 2020-05-07 MED ORDER — SENNOSIDES-DOCUSATE SODIUM 8.6-50 MG PO TABS: 1.0000 | ORAL_TABLET | Freq: Every evening | ORAL | Status: DC | PRN

## 2020-05-07 MED ORDER — ONDANSETRON HCL 4 MG/2ML IJ SOLN: 4.0000 mg | Freq: Three times a day (TID) | INTRAMUSCULAR | Status: DC | PRN

## 2020-05-07 MED ORDER — SODIUM CHLORIDE 0.9 % IV SOLN: INTRAVENOUS | Status: DC

## 2020-05-07 MED ORDER — ATORVASTATIN CALCIUM 20 MG PO TABS
40.0000 mg | ORAL_TABLET | Freq: Every day | ORAL | Status: DC
  Administered 2020-05-08 – 2020-05-09 (×2): 40 mg via ORAL
  Filled 2020-05-07 (×2): qty 2

## 2020-05-07 MED ORDER — AMIODARONE HCL 200 MG PO TABS
200.0000 mg | ORAL_TABLET | Freq: Every day | ORAL | Status: DC
  Administered 2020-05-08 – 2020-05-09 (×2): 200 mg via ORAL
  Filled 2020-05-07 (×3): qty 1

## 2020-05-07 MED ORDER — ASPIRIN EC 81 MG PO TBEC
81.0000 mg | DELAYED_RELEASE_TABLET | Freq: Once | ORAL | Status: AC
  Administered 2020-05-08: 10:00:00 81 mg via ORAL
  Filled 2020-05-07: qty 1

## 2020-05-07 MED ORDER — ACETAMINOPHEN 325 MG PO TABS: 650.0000 mg | ORAL_TABLET | ORAL | Status: DC | PRN

## 2020-05-07 MED ORDER — STROKE: EARLY STAGES OF RECOVERY BOOK: Freq: Once | Status: AC

## 2020-05-07 MED ORDER — VALACYCLOVIR HCL 500 MG PO TABS
500.0000 mg | ORAL_TABLET | Freq: Every day | ORAL | Status: DC
  Administered 2020-05-07 – 2020-05-08 (×2): 500 mg via ORAL
  Filled 2020-05-07 (×3): qty 1

## 2020-05-07 MED ORDER — HYDRALAZINE HCL 20 MG/ML IJ SOLN: 5.0000 mg | INTRAMUSCULAR | Status: DC | PRN

## 2020-05-07 MED ORDER — PANTOPRAZOLE SODIUM 40 MG PO TBEC
40.0000 mg | DELAYED_RELEASE_TABLET | Freq: Every day | ORAL | Status: DC
  Administered 2020-05-07 – 2020-05-08 (×2): 40 mg via ORAL
  Filled 2020-05-07 (×2): qty 1

## 2020-05-07 MED ORDER — CLOBETASOL PROPIONATE 0.05 % EX OINT: 1.0000 "application " | TOPICAL_OINTMENT | Freq: Every day | CUTANEOUS | Status: DC | PRN

## 2020-05-07 MED ORDER — ACETAMINOPHEN 650 MG RE SUPP: 650.0000 mg | RECTAL | Status: DC | PRN

## 2020-05-07 NOTE — ED Notes (Signed)
Pt back from CT

## 2020-05-07 NOTE — ED Triage Notes (Signed)
Pt c/o having right arm numbness starting around 1:45pm today while taking a shower and when he came out his wife was not able to understand his speech, speech is slurred at present. Denies any right arm numbness now. No facial droop noted. 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

## 2020-05-07 NOTE — Consult Note (Signed)
Cardiology Consultation Note    Patient ID: Terry [name], MRN: [id number], DOB/AGE: 71-25-1952 71 y.o. Admit date: 05/07/2020   Date of Consult: 05/07/2020 Primary Physician: Gracelyn Nurse, MD Primary Cardiologist: Dr. Darrold Junker  Chief Complaint: right arm numbness and speech difficulty Reason for Consultation: tia s/p mitral valve surgery Requesting MD: Dr. Clyde Lundborg  HPI: Terry [name] is a 69 y.o. male with history of HTN, Hyperlipidemia and severe mitral valve prolapse who is s/p surgical mvr on 04/12/20 at [facility]. He awoke this am and noted right arm numbness and difficulty speaking. He came to the er for evaluation. EKG showed nsr with no ischemia. Head CT was unremarkable. Pt was on amiodarone since discharge from [facility] due to atrial arrhythmia post op. He is unaware of afib in the past. He was on plavix and asa along with atorvastatin . He denies chest pain or sob and his neurologic symptoms are improving. Echo and brain mri is pending. CXR shsowed right mid lung atelectasis with no pulmonary edema. Labs Showed mild increasein creatinine over baseline to 1.37. K was 3.9.   Past Medical History:  Diagnosis Date  . Diverticulosis   . GERD (gastroesophageal reflux disease)   . Heart murmur   . Hypercholesteremia   . Hypertension   . Mitral valve prolapse       Surgical History:  Past Surgical History:  Procedure Laterality Date  . COLONOSCOPY WITH PROPOFOL N/A 03/06/2020   Procedure: COLONOSCOPY WITH PROPOFOL;  Surgeon: Toledo, Boykin Nearing, MD;  Location: [facility] ENDOSCOPY;  Service: Gastroenterology;  Laterality: N/A;  . MITRAL VALVE REPLACEMENT    . RETINAL DETACHMENT SURGERY    . RIGHT/LEFT HEART CATH AND CORONARY ANGIOGRAPHY Bilateral 02/18/2017   Procedure: Right/Left Heart Cath and Coronary Angiography;  Surgeon: Marcina Millard, MD;  Location: [facility] IN[facility]IVE [facility];  Service: Cardiovascular;  Laterality: Bilateral;  . RIGHT/LEFT HEART CATH AND CORONARY ANGIOGRAPHY N/A 02/22/2020   Procedure: RIGHT/LEFT HEART CATH AND CORONARY ANGIOGRAPHY;  Surgeon: Marcina Millard, MD;  Location: [facility] INV[facility];  Service: Cardiovascular;  Laterality: N/A;  . TEMPOROMANDIBULAR JOINT SURGERY       Home Meds: Prior to Admission medications   Medication Sig Start Date End Date Taking? Authorizing Provider  amiodarone (PACERONE) 200 MG tablet  05/06/20  Yes [provider]  aspirin 81 MG chewable tablet Chew by mouth. 04/18/20 04/18/21 Yes [provider]  clobetasol ointment (TEMOVATE) 0.05 % Apply 1 application topically daily as needed (skin irritation).  01/07/17  Yes [provider]  furosemide (LASIX) 40 MG tablet Take 40 mg by mouth daily. 04/17/20  Yes [provider]  KLOR-CON M20 20 MEQ tablet Take 20 mEq by mouth 2 (two) times daily. 04/17/20  Yes [provider]  Multiple Vitamin (MULTIVITAMIN WITH MINERALS) TABS tablet Take 1 tablet by mouth at bedtime.   Yes [provider]  omeprazole (PRILOSEC) 20 MG capsule Take 20 mg by mouth every evening.  01/11/17  Yes [provider]  ondansetron (ZOFRAN) 4 MG tablet Take 4 mg by mouth every 8 (eight) hours as needed. 04/18/20  Yes [provider]  triamcinolone cream (KENALOG) 0.1 % Apply 1 application topically daily as needed (skin irritation.).  01/11/17  Yes [provider]  valACYclovir (VALTREX) 500 MG tablet Take 500 mg by mouth every evening.  01/11/17  Yes [provider]  Probiotic Product (PROBIOTIC PO) Take 1 capsule by mouth at bedtime.     [provider]  Inpatient Medications:  .  stroke: mapping our early stages of recovery book   Does not apply Once  . amiodarone  200 mg Oral Daily  . [START ON 05/08/2020] aspirin EC  81 mg Oral Once  . atorvastatin  40 mg Oral Daily  . [START ON 05/08/2020] clopidogrel  75 mg Oral Once  . enoxaparin (LOVENOX) injection  40 mg Subcutaneous Q24H  . sodium chloride flush  3 mL  Intravenous Once   . sodium chloride      Allergies:  Allergies  Allergen Reactions  . Penicillins Shortness Of Breath    Did it involve swelling of the face/tongue/throat, SOB, or low BP? Yes Did it involve sudden or severe rash/hives, skin peeling, or any reaction on the inside of your mouth or nose? Unknown Did you need to seek medical attention at a hospital or doctor's office? Was at a clinic when reaction occurred When did it last happen?More than 50 years ago If all above answers are "NO", may proceed with cephalosporin use.   . Ciprofloxacin Swelling and Rash  . Ivp Dye [Iodinated Diagnostic Agents] Rash    Social History   Socioeconomic History  . Marital status: Married    Spouse name: Not on file  . Number of children: Not on file  . Years of education: Not on file  . Highest education level: Not on file  Occupational History  . Not on file  Tobacco Use  . Smoking status: Former Smoker    Quit date: 1985    Years since quitting: 36.3  . Smokeless tobacco: Never Used  Substance and Sexual Activity  . Alcohol use: Yes    Comment: ocassionally  . Drug use: No  . Sexual activity: Not on file  Other Topics Concern  . Not on file  Social History Narrative  . Not on file   Social Determinants of Health   Financial Resource Strain:   . Difficulty of Paying Living Expenses:   Food Insecurity:   . Worried About Programme researcher, broadcasting/film/video in the Last Year:   . Barista in the Last Year:   Transportation Needs:   . Freight forwarder (Medical):   Marland Kitchen Lack of Transportation (Non-Medical):   Physical Activity:   . Days of Exercise per Week:   . Minutes of Exercise per Session:   Stress:   . Feeling of Stress :   Social Connections:   . Frequency of Communication with Friends and Family:   . Frequency of Social Gatherings with Friends and Family:   . Attends Religious Services:   . Active Member of Clubs or Organizations:   . Attends Banker  Meetings:   Marland Kitchen Marital Status:   Intimate Partner Violence:   . Fear of Current or Ex-Partner:   . Emotionally Abused:   Marland Kitchen Physically Abused:   . Sexually Abused:      Family History  Problem Relation Age of Onset  . Varicose Veins Mother   . Cancer Father      Review of Systems: A 12-system review of systems was performed and is negative except as noted in the HPI.  Labs: No results for input(s): CKTOTAL, CKMB, TROPONINI in the last 72 hours. Lab Results  Component Value Date   WBC 4.7 05/07/2020   HGB 13.7 05/07/2020   HCT 39.2 05/07/2020   MCV 95.4 05/07/2020   PLT 202 05/07/2020    Recent Labs  Lab 05/07/20 1437  NA 139  K 3.9  CL 105  CO2 25  BUN 13  CREATININE 1.37*  CALCIUM 9.6  PROT 7.5  BILITOT 0.7  ALKPHOS 74  ALT 22  AST 21  GLUCOSE 134*   No results found for: CHOL, HDL, LDLCALC, TRIG No results found for: DDIMER  Radiology/Studies:  DG Chest Portable 1 View  Result Date: 05/07/2020 CLINICAL DATA:  Right arm tingling EXAM: PORTABLE CHEST 1 VIEW COMPARISON:  None. FINDINGS: Linear atelectasis or scarring in the right mid lung. Cardiomegaly. Left lung clear. No effusions. No acute bony abnormality. IMPRESSION: Right mid lung atelectasis or scarring. Cardiomegaly. Electronically Signed   By: Rolm Baptise M.D.   On: 05/07/2020 15:37   CT HEAD CODE STROKE WO CONTRAST  Addendum Date: 05/07/2020   ADDENDUM REPORT: 05/07/2020 15:09 ADDENDUM: Study discussed by telephone with Dr. Marjean Donna on 05/07/2020 at 1459 hours. Electronically Signed   By: Genevie Ann M.D.   On: 05/07/2020 15:09   Result Date: 05/07/2020 CLINICAL DATA:  Code stroke. 69 year old male with right arm numbness beginning at 1345 hours today and slurred speech. EXAM: CT HEAD WITHOUT CONTRAST TECHNIQUE: Contiguous axial images were obtained from the base of the skull through the vertex without intravenous contrast. COMPARISON:  None. FINDINGS: Brain: No acute intracranial hemorrhage identified.  No midline shift, mass effect, or evidence of intracranial mass lesion. No ventriculomegaly. No cortically based acute infarct identified. Bilateral gray-white matter differentiation is within normal limits for age. Vascular: Mild Calcified atherosclerosis at the skull base. No suspicious intracranial vascular hyperdensity. Skull: Negative. Sinuses/Orbits: Visualized paranasal sinuses and mastoids are clear. Other: No acute orbit or scalp soft tissue findings. ASPECTS [facility] Stroke Program Early CT Score) Total score (0-10 with 10 being normal): 10 IMPRESSION: Normal for age non contrast CT appearance of the brain. ASPECTS 10. Electronically Signed: By: Genevie Ann M.D. On: 05/07/2020 14:52    Wt Readings from Last 3 Encounters:  05/07/20 79.4 kg  04/01/20 83.9 kg  03/06/20 82.1 kg    EKG: nsr with no ischemia  Physical Exam:  Blood pressure (!) 154/73, pulse 85, temperature 98 F (36.7 C), temperature source Axillary, resp. rate (!) 22, height 5\' 6"  (1.676 m), weight 79.4 kg, SpO2 100 %. Body mass index is 28.25 kg/m. General: Well developed, well nourished, in no acute distress. Head: Normocephalic, atraumatic, sclera non-icteric, no xanthomas, nares are without discharge.  Neck: Negative for carotid bruits. JVD not elevated. Lungs: Clear bilaterally to auscultation without wheezes, rales, or rhonchi. Breathing is unlabored. Heart: RRR with S1 S2. No murmurs, rubs, or gallops appreciated. Abdomen: Soft, non-tender, non-distended with normoactive bowel sounds. No hepatomegaly. No rebound/guarding. No obvious abdominal masses. Msk:  Strength and tone appear normal for age. Extremities: No clubbing or cyanosis. No edema.  Distal pedal pulses are 2+ and equal bilaterally. Neuro: Alert and oriented X 3. No facial asymmetry. No focal deficit. Moves all extremities spontaneously. Psych:  Responds to questions appropriately with a normal affect.     Assessment and Plan  69 yo s/p mvr at [facility]  with a tissue mitral prosthesis now with symptoms of tia/cva less than a month post op. Was on amiodarone for apparent post surgical afib. Currently in nsr. Brain mri and echo pending. Afebrile. Agree with asa and plavix and will conitnue amiodarone for now. Further recs after echo and mri.   Signed, Teodoro Spray MD 05/07/2020, 6:33 PM Pager: [phone]

## 2020-05-07 NOTE — Consult Note (Signed)
Requesting Physician: Jari Pigg    Chief Complaint: Difficulty with speech, right arm numbness  I have been asked by Dr. Jari Pigg to see this patient in consultation for code stroke.  HPI: Terry [name] is an 69 y.o. male with a history of HLD, HTN and mitral valve prolapse s/p mitral valve replacement on 04/12/20 who reports waking up at baseline.  Went to take shower and while in the shower noted that his right arm became numb.  Once out of the shower was unable to communicate with his wife.  Seemed dazed.  Patient brought in for evaluation.  Initial NIHSS of 1 for minor word finding difficulties.    Date last known well: 05/07/2020 Time last known well: Time: 14:00 tPA Given: No: Improving symptoms, recent cardiac surgery  Past Medical History:  Diagnosis Date  . Diverticulosis   . GERD (gastroesophageal reflux disease)   . Heart murmur   . Hypercholesteremia   . Hypertension   . Mitral valve prolapse     Past Surgical History:  Procedure Laterality Date  . COLONOSCOPY WITH PROPOFOL N/A 03/06/2020   Procedure: COLONOSCOPY WITH PROPOFOL;  Surgeon: Toledo, Benay Pike, MD;  Location: [facility];  Service: Gastroenterology;  Laterality: N/A;  . MITRAL VALVE REPLACEMENT    . RETINAL DETACHMENT SURGERY    . RIGHT/LEFT HEART CATH AND CORONARY ANGIOGRAPHY Bilateral 02/18/2017   Procedure: Right/Left Heart Cath and Coronary Angiography;  Surgeon: Isaias Cowman, MD;  Location: [place] [facility];  Service: Cardiovascular;  Laterality: Bilateral;  . RIGHT/LEFT HEART CATH AND CORONARY ANGIOGRAPHY N/A 02/22/2020   Procedure: RIGHT/LEFT HEART CATH AND CORONARY ANGIOGRAPHY;  Surgeon: Isaias Cowman, MD;  Location: [place] [facility];  Service: Cardiovascular;  Laterality: N/A;  . TEMPOROMANDIBULAR JOINT SURGERY      Family History  Problem Relation Age of Onset  . Varicose Veins Mother   . Cancer Father    Social History:  reports that he quit smoking about 36 years ago. He has  never used smokeless tobacco. He reports current alcohol use. He reports that he does not use drugs.  Allergies:  Allergies  Allergen Reactions  . Penicillins Shortness Of Breath    Did it involve swelling of the face/tongue/throat, SOB, or low BP? Yes Did it involve sudden or severe rash/hives, skin peeling, or any reaction on the inside of your mouth or nose? Unknown Did you need to seek medical attention at a hospital or doctor's office? Was at a clinic when reaction occurred When did it last happen?More than 50 years ago If all above answers are "NO", may proceed with cephalosporin use.   . Ciprofloxacin Swelling and Rash  . Ivp Dye [Iodinated Diagnostic Agents] Rash    Medications: Prior to Admission medications   Medication Sig Start Date End Date Taking? Authorizing Provider  B Complex Vitamins (B COMPLEX 100 PO) Take 1 capsule by mouth every evening.    [provider]  clobetasol ointment (TEMOVATE) 9.70 % Apply 1 application topically daily as needed (skin irritation).  01/07/17   [provider]  Multiple Vitamin (MULTIVITAMIN WITH MINERALS) TABS tablet Take 1 tablet by mouth at bedtime.    [provider]  omeprazole (PRILOSEC) 20 MG capsule Take 20 mg by mouth every evening.  01/11/17   [provider]  Probiotic Product (PROBIOTIC PO) Take 1 capsule by mouth at bedtime.     [provider]  triamcinolone cream (KENALOG) 0.1 % Apply 1 application topically daily as needed (skin irritation.).  01/11/17   [provider]  valACYclovir (VALTREX) 500 MG tablet Take 500 mg by mouth every evening.  01/11/17   [provider]  ASA   ROS: History obtained from the patient  General ROS: negative for - chills, fatigue, fever, night sweats, weight gain or weight loss Psychological ROS: negative for - behavioral disorder, hallucinations, memory difficulties, mood swings or suicidal ideation Ophthalmic ROS: negative for -  blurry vision, double vision, eye pain or loss of vision ENT ROS: negative for - epistaxis, nasal discharge, oral lesions, sore throat, tinnitus or vertigo Allergy and Immunology ROS: negative for - hives or itchy/watery eyes Hematological and Lymphatic ROS: negative for - bleeding problems, bruising or swollen lymph nodes Endocrine ROS: negative for - galactorrhea, hair pattern changes, polydipsia/polyuria or temperature intolerance Respiratory ROS: negative for - cough, hemoptysis, shortness of breath or wheezing Cardiovascular ROS: negative for - chest pain, dyspnea on exertion, edema or irregular heartbeat Gastrointestinal ROS: negative for - abdominal pain, diarrhea, hematemesis, nausea/vomiting or stool incontinence Genito-Urinary ROS: negative for - dysuria, hematuria, incontinence or urinary frequency/urgency Musculoskeletal ROS: negative for - joint swelling or muscular weakness Neurological ROS: as noted in HPI Dermatological ROS: negative for rash and skin lesion changes  Physical Examination: Blood pressure 129/87, pulse 86, resp. rate 18, height 5\' 6"  (1.676 m), weight 79.4 kg, SpO2 100 %.  HEENT-  Normocephalic, no lesions, without obvious abnormality.  Normal external eye and conjunctiva.  Normal TM's bilaterally.  Normal auditory canals and external ears. Normal external nose, mucus membranes and septum.  Normal pharynx. Cardiovascular- S1, S2 normal, pulses palpable throughout   Lungs- chest clear, no wheezing, rales, normal symmetric air entry Abdomen- soft, non-tender; bowel sounds normal; no masses,  no organomegaly Extremities- no edema Lymph-no adenopathy palpable Musculoskeletal-no joint tenderness, deformity or swelling Skin-warm and dry, no hyperpigmentation, vitiligo, or suspicious lesions  Neurological Examination   Mental Status: Alert, oriented, thought content appropriate.  Some mild word finding difficulties noted intermittently.  Able to follow 3 step  commands without difficulty. Cranial Nerves: II: Visual fields grossly normal, pupils equal, round, reactive to light and accommodation III,IV, VI: ptosis not present, extra-ocular motions intact bilaterally V,VII: smile symmetric, facial light touch sensation normal bilaterally VIII: hearing normal bilaterally IX,X: gag reflex present XI: bilateral shoulder shrug XII: midline tongue extension Motor: Right : Upper extremity   5/5    Left:     Upper extremity   5/5  Lower extremity   5/5     Lower extremity   5/5 Tone and bulk:normal tone throughout; no atrophy noted Sensory: Pinprick and light touch intact throughout, bilaterally Deep Tendon Reflexes: Symmetric throughout Plantars: Right: mute   Left: mute Cerebellar: Normal finger-to-nose and normal heel-to-shin testing bilaterally Gait: not tested due to safety concerns    Laboratory Studies:  Basic Metabolic Panel: No results for input(s): NA, K, CL, CO2, GLUCOSE, BUN, CREATININE, CALCIUM, MG, PHOS in the last 168 hours.  Liver Function Tests: No results for input(s): AST, ALT, ALKPHOS, BILITOT, PROT, ALBUMIN in the last 168 hours. No results for input(s): LIPASE, AMYLASE in the last 168 hours. No results for input(s): AMMONIA in the last 168 hours.  CBC: Recent Labs  Lab 05/07/20 1437  WBC 4.7  NEUTROABS 2.5  HGB 13.7  HCT 39.2  MCV 95.4  PLT 202    Cardiac Enzymes: No results for input(s): CKTOTAL, CKMB, CKMBINDEX, TROPONINI in the last 168 hours.  BNP: Invalid input(s): POCBNP  CBG: Recent Labs  Lab 05/07/20 1438  GLUCAP 133*    Microbiology: Results for orders placed or performed during the hospital encounter of 03/04/20  SARS CORONAVIRUS 2 (TAT 6-24 HRS) Nasopharyngeal Nasopharyngeal Swab     Status: None   Collection Time: 03/04/20  1:43 PM   Specimen: Nasopharyngeal Swab  Result Value Ref Range Status   SARS Coronavirus 2 NEGATIVE NEGATIVE Final    Comment: (NOTE) SARS-CoV-2 target nucleic  acids are NOT DETECTED. The SARS-CoV-2 RNA is generally detectable in upper and lower respiratory specimens during the acute phase of infection. Negative results do not preclude SARS-CoV-2 infection, do not rule out co-infections with other pathogens, and should not be used as the sole basis for treatment or other patient management decisions. Negative results must be combined with clinical observations, patient history, and epidemiological information. The expected result is Negative. Fact Sheet for Patients: HairSlick.no Fact Sheet for Healthcare Providers: quierodirigir.com This test is not yet approved or cleared by the [place] FDA and  has been authorized for detection and/or diagnosis of SARS-CoV-2 by FDA under an Emergency Use Authorization (EUA). This EUA will remain  in effect (meaning this test can be used) for the duration of the COVID-19 declaration under Section 56 4(b)(1) of the Act, 21 U.S.C. section 360bbb-3(b)(1), unless the authorization is terminated or revoked sooner. Performed at [facility], [address] [place], [place] [address]     Coagulation Studies: Recent Labs    05/07/20 1437  LABPROT 13.4  INR 1.1    Urinalysis: No results for input(s): COLORURINE, LABSPEC, PHURINE, GLUCOSEU, HGBUR, BILIRUBINUR, KETONESUR, PROTEINUR, UROBILINOGEN, NITRITE, LEUKOCYTESUR in the last 168 hours.  Invalid input(s): APPERANCEUR  Lipid Panel: No results found for: CHOL, TRIG, HDL, CHOLHDL, VLDL, LDLCALC  HgbA1C: No results found for: HGBA1C  Urine Drug Screen:  No results found for: LABOPIA, COCAINSCRNUR, LABBENZ, AMPHETMU, THCU, LABBARB  Alcohol Level: No results for input(s): ETH in the last 168 hours.  Other results: EKG: sinus rhythm at 88 bpm.  Imaging: CT HEAD CODE STROKE WO CONTRAST  Addendum Date: 05/07/2020   ADDENDUM REPORT: 05/07/2020 15:09 ADDENDUM: Study discussed by telephone  with Dr. Artis Delay on 05/07/2020 at 1459 hours. Electronically Signed   By: Odessa Fleming M.D.   On: 05/07/2020 15:09   Result Date: 05/07/2020 CLINICAL DATA:  Code stroke. 69 year old male with right arm numbness beginning at 1345 hours today and slurred speech. EXAM: CT HEAD WITHOUT CONTRAST TECHNIQUE: Contiguous axial images were obtained from the base of the skull through the vertex without intravenous contrast. COMPARISON:  None. FINDINGS: Brain: No acute intracranial hemorrhage identified. No midline shift, mass effect, or evidence of intracranial mass lesion. No ventriculomegaly. No cortically based acute infarct identified. Bilateral gray-white matter differentiation is within normal limits for age. Vascular: Mild Calcified atherosclerosis at the skull base. No suspicious intracranial vascular hyperdensity. Skull: Negative. Sinuses/Orbits: Visualized paranasal sinuses and mastoids are clear. Other: No acute orbit or scalp soft tissue findings. ASPECTS [facility] Stroke Program Early CT Score) Total score (0-10 with 10 being normal): 10 IMPRESSION: Normal for age non contrast CT appearance of the brain. ASPECTS 10. Electronically Signed: By: Odessa Fleming M.D. On: 05/07/2020 14:52    Assessment: 69 y.o. male with a history of HLD, HTN and mitral valve prolapse s/p mitral valve replacement on 04/12/20 who reports waking up at baseline.  Went to take shower and while in the shower noted that his right arm became numb.  Once out of the shower was  unable to communicate with his wife.  Patient brought in for evaluation.  Symptoms now resolving with only some minor word finding deficits noted.  Patient not a tPA candidate due to resolving symptoms.  Head CT personally reviewed and shows no acute changes.  Examination not consistent with LVO.    Stroke Risk Factors - hyperlipidemia and hypertension  Plan: 1. HgbA1c, fasting lipid panel 2. MRI of the brain without contrast 3. PT consult, OT consult, Speech consult 4.  Echocardiogram unless another study for cardiac evaluation recommended by cardiology.   5. Carotid dopplers 6. Prophylactic therapy-ASA 81mg  and Plavix 75mg  daily.  Plavix 150mg  to be given now along with 162mg  ASA. 7. NPO until RN stroke swallow screen 8. Telemetry monitoring 9. Frequent neuro checks 10. Cardiology evaluation   , MD Neurology [phone] 05/07/2020, 3:21 PM

## 2020-05-07 NOTE — H&P (Addendum)
History and Physical    Terry [name] IHK:[id number] DOB: [birth year] DOA: 05/07/2020  Referring MD/NP/PA:   PCP: Baxter Hire, MD   Patient coming from:  The patient is coming from home.  At baseline, pt is independent for most of ADL.        Chief Complaint: Right arm numbness and difficulty speaking  HPI: Terry [name] is a 69 y.o. 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  Per his wife, patient started having right arm numbness at about 145 today while he was taking shower.  He also had difficulty speaking and and slurred speech.  No weakness in extremities.  No facial droop, vision loss.  He has chronic hearing loss, which has not changed.  He is numbness has improved at arrival to the ED.  Of note, patient had  mitral valve replacement with pig valve at [facility] 04/12/2020  ED Course: pt was found to have WBC 4.7, INR 1.1, PTT 31, pending COVID-19 PCR, renal function close to baseline, no fever, blood pressure 129/87, heart rate 86, RR 18, oxygen saturation 100% on room air.  Chest x-ray showed right mid atelectasis.  CT of head is negative for acute intracranial abnormalities.  Patient is admitted to [place] bed for observation.  Neurologist, Dr. Doy Mince is consulted.  ED physician also informed cardiology team due to recent lateral valve replacement.   Review of Systems:   General: no fevers, chills, no body weight gain, has fatigue HEENT: no blurry vision, hearing changes or sore throat Respiratory: no dyspnea, coughing, wheezing CV: no chest pain, no palpitations GI: no nausea, vomiting, abdominal pain, diarrhea, constipation GU: no dysuria, burning on urination, increased urinary frequency, hematuria  Ext: no leg edema Neuro: No vision change. Has chronic hearing loss. Has  difficulty speaking in the right arm numbness Skin: no rash, no skin tear. MSK: No muscle spasm, no deformity, no limitation of range of movement in spin Heme: No easy bruising.  Travel history: No recent long distant travel.  Allergy:  Allergies  Allergen Reactions  . Penicillins Shortness Of Breath    Did it involve swelling of the face/tongue/throat, SOB, or low BP? Yes Did it involve sudden or severe rash/hives, skin peeling, or any reaction on the inside of your mouth or nose? Unknown Did you need to seek medical attention at a hospital or doctor's office? Was at a clinic when reaction occurred When did it last happen?More than 50 years ago If all above answers are "NO", may proceed with cephalosporin use.   . Ciprofloxacin Swelling and Rash  . Ivp Dye [Iodinated Diagnostic Agents] Rash    Past Medical History:  Diagnosis Date  . Diverticulosis   . GERD (gastroesophageal reflux disease)   . Heart murmur   . Hypercholesteremia   . Hypertension   . Mitral valve prolapse     Past Surgical History:  Procedure Laterality Date  . COLONOSCOPY WITH PROPOFOL N/A 03/06/2020   Procedure: COLONOSCOPY WITH PROPOFOL;  Surgeon: Toledo, Benay Pike, MD;  Location: [facility] ENDOSCOPY;  Service: Gastroenterology;  Laterality: N/A;  . MITRAL VALVE REPLACEMENT    . RETINAL DETACHMENT SURGERY    . RIGHT/LEFT HEART CATH AND CORONARY ANGIOGRAPHY Bilateral 02/18/2017   Procedure: Right/Left Heart Cath and Coronary Angiography;  Surgeon: Isaias Cowman, MD;  Location: [place] [facility];  Service: Cardiovascular;  Laterality: Bilateral;  . RIGHT/LEFT HEART CATH AND CORONARY ANGIOGRAPHY N/A 02/22/2020   Procedure: RIGHT/LEFT HEART CATH AND CORONARY ANGIOGRAPHY;  Surgeon: Marcina MillardParaschos, Alexander, MD;  Location: [facility] INVASIVE CV LAB;  Service: Cardiovascular;  Laterality: N/A;  . TEMPOROMANDIBULAR JOINT SURGERY      Social History:  reports that he quit smoking about 36 years ago. He has never used  smokeless tobacco. He reports current alcohol use. He reports that he does not use drugs.  Family History:  Family History  Problem Relation Age of Onset  . Varicose Veins Mother   . Cancer Father      Prior to Admission medications   Medication Sig Start Date End Date Taking? Authorizing Provider  B Complex Vitamins (B COMPLEX 100 PO) Take 1 capsule by mouth every evening.    [provider]  clobetasol ointment (TEMOVATE) 0.05 % Apply 1 application topically daily as needed (skin irritation).  01/07/17   [provider]  Multiple Vitamin (MULTIVITAMIN WITH MINERALS) TABS tablet Take 1 tablet by mouth at bedtime.    [provider]  omeprazole (PRILOSEC) 20 MG capsule Take 20 mg by mouth every evening.  01/11/17   [provider]  Probiotic Product (PROBIOTIC PO) Take 1 capsule by mouth at bedtime.     [provider]  triamcinolone cream (KENALOG) 0.1 % Apply 1 application topically daily as needed (skin irritation.).  01/11/17   [provider]  valACYclovir (VALTREX) 500 MG tablet Take 500 mg by mouth every evening.  01/11/17   [provider]    Physical Exam: Vitals:   05/07/20 1700 05/07/20 1715 05/07/20 1745 05/07/20 1941  BP: (!) 143/93 (!) 134/95 (!) 154/73 (!) 139/97  Pulse: 82 84 85 87  Resp: 19 20 (!) 22 18  Temp:    98.9 F (37.2 C)  TempSrc:    Axillary  SpO2: 99% 99% 100% 100%  Weight:      Height:       General: Not in acute distress HEENT:       Eyes: PERRL, EOMI, no scleral icterus.       ENT: No discharge from the ears and nose, no pharynx injection, no tonsillar enlargement.        Neck: No JVD, no bruit, no mass felt. Heme: No neck lymph node enlargement. Cardiac: S1/S2, RRR, No murmurs, No gallops or rubs. Respiratory:  No rales, wheezing, rhonchi or rubs. GI: Soft, nondistended, nontender, no rebound pain, no organomegaly, BS present. GU: No hematuria Ext: No pitting leg edema bilaterally.  2+DP/PT pulse bilaterally. Musculoskeletal: No joint deformities, No joint redness or warmth, no limitation of ROM in spin. Skin: No rashes.  Neuro: Alert, oriented X3, cranial nerves II-XII grossly intact, moves all extremities normally. Muscle strength 5/5 in all extremities, sensation to light touch intact.  Psych: Patient is not psychotic, no suicidal or hemocidal ideation.  Labs on Admission: I have personally reviewed following labs and imaging studies  CBC: Recent Labs  Lab 05/07/20 1437  WBC 4.7  NEUTROABS 2.5  HGB 13.7  HCT 39.2  MCV 95.4  PLT 202   Basic Metabolic Panel: Recent Labs  Lab 05/07/20 1437  NA 139  K 3.9  CL 105  CO2 25  GLUCOSE 134*  BUN 13  CREATININE 1.37*  CALCIUM 9.6   GFR: Estimated Creatinine Clearance: 49.7 mL/min (A) (by C-G formula based on SCr of 1.37 mg/dL (H)). Liver Function Tests: Recent Labs  Lab 05/07/20 1437  AST 21  ALT 22  ALKPHOS 74  BILITOT 0.7  PROT 7.5  ALBUMIN 3.9   No results for input(s): LIPASE, AMYLASE in the last 168 hours. No results for input(s): AMMONIA in the last 168 hours. Coagulation Profile: Recent Labs  Lab 05/07/20 1437  INR 1.1   Cardiac Enzymes: No results for input(s): CKTOTAL, CKMB, CKMBINDEX, TROPONINI in the last 168 hours. BNP (last 3 results) No results for input(s): PROBNP in the last 8760 hours. HbA1C: No results for input(s): HGBA1C in the last 72 hours. CBG: Recent Labs  Lab 05/07/20 1438  GLUCAP 133*   Lipid Profile: No results for input(s): CHOL, HDL, LDLCALC, TRIG, CHOLHDL, LDLDIRECT in the last 72 hours. Thyroid Function Tests: No results for input(s): TSH, T4TOTAL, FREET4, T3FREE, THYROIDAB in the last 72 hours. Anemia Panel: No results for input(s): VITAMINB12, FOLATE, FERRITIN, TIBC, IRON, RETICCTPCT in the last 72 hours. Urine analysis: No results found for: COLORURINE, APPEARANCEUR, LABSPEC, PHURINE, GLUCOSEU, HGBUR, BILIRUBINUR, KETONESUR, PROTEINUR,  UROBILINOGEN, NITRITE, LEUKOCYTESUR Sepsis Labs: @LABRCNTIP (procalcitonin:4,lacticidven:4) ) Recent Results (from the past 240 hour(s))  SARS Coronavirus 2 by RT PCR (hospital order, performed in [facility] hospital lab) Nasopharyngeal Nasopharyngeal Swab     Status: None   Collection Time: 05/07/20  4:09 PM   Specimen: Nasopharyngeal Swab  Result Value Ref Range Status   SARS Coronavirus 2 NEGATIVE NEGATIVE Final    Comment: (NOTE) SARS-CoV-2 target nucleic acids are NOT DETECTED. The SARS-CoV-2 RNA is generally detectable in upper and lower respiratory specimens during the acute phase of infection. The lowest concentration of SARS-CoV-2 viral copies this assay can detect is 250 copies / mL. A negative result does not preclude SARS-CoV-2 infection and should not be used as the sole basis for treatment or other patient management decisions.  A negative result may occur with improper specimen collection / handling, submission of specimen other than nasopharyngeal swab, presence of viral mutation(s) within the areas targeted by this assay, and inadequate number of viral copies (<250 copies / mL). A negative result must be combined with clinical observations, patient history, and epidemiological information. Fact Sheet for Patients:   07/07/20 Fact Sheet for Healthcare Providers: BoilerBrush.com.cy This test is not yet approved or cleared  by the https://pope.com/ FDA and has been authorized for detection and/or diagnosis of SARS-CoV-2 by FDA under an Emergency Use Authorization (EUA).  This EUA will remain in effect (meaning this test can be used) for the duration of the COVID-19 declaration under Section 564(b)(1) of the Act, 21 U.S.C. section 360bbb-3(b)(1), unless the authorization is terminated or revoked sooner. Performed at [facility], [address], [place], [place]      Radiological Exams on  Admission: MR BRAIN WO CONTRAST  Result Date: 05/07/2020 CLINICAL DATA:  Stroke. History of mitral valve replacement 04/12/2020 EXAM: MRI HEAD WITHOUT CONTRAST TECHNIQUE: Multiplanar, multiecho pulse sequences of the brain and surrounding structures were obtained without intravenous contrast. COMPARISON:  CT head 05/07/2020 FINDINGS: Brain: Multiple areas of restricted diffusion compatible with acute infarct. Small acute infarcts in the cerebellum bilaterally. Small acute cortical infarcts in the right parietal lobe and in the right parietal white matter. Acute infarct in the left operculum and insula. Negative for hemorrhage or mass. Ventricle size and cerebral volume normal for age. Vascular: Normal arterial flow voids Skull and upper cervical spine: No focal skeletal lesion. Sinuses/Orbits: Mild mucosal edema paranasal sinuses. Right cataract extraction. No orbital lesion Other: None IMPRESSION: Multiple areas of acute infarct including both cerebral hemispheres  and in the cerebellum bilaterally. Findings compatible with emboli. No hemorrhage or mass. Electronically Signed   By: Marlan Palau M.D.   On: 05/07/2020 18:52   DG Chest Portable 1 View  Result Date: 05/07/2020 CLINICAL DATA:  Right arm tingling EXAM: PORTABLE CHEST 1 VIEW COMPARISON:  None. FINDINGS: Linear atelectasis or scarring in the right mid lung. Cardiomegaly. Left lung clear. No effusions. No acute bony abnormality. IMPRESSION: Right mid lung atelectasis or scarring. Cardiomegaly. Electronically Signed   By: Charlett Nose M.D.   On: 05/07/2020 15:37   CT HEAD CODE STROKE WO CONTRAST  Addendum Date: 05/07/2020   ADDENDUM REPORT: 05/07/2020 15:09 ADDENDUM: Study discussed by telephone with Dr. Artis Delay on 05/07/2020 at 1459 hours. Electronically Signed   By: Odessa Fleming M.D.   On: 05/07/2020 15:09   Result Date: 05/07/2020 CLINICAL DATA:  Code stroke. 69 year old male with right arm numbness beginning at 1345 hours today and slurred  speech. EXAM: CT HEAD WITHOUT CONTRAST TECHNIQUE: Contiguous axial images were obtained from the base of the skull through the vertex without intravenous contrast. COMPARISON:  None. FINDINGS: Brain: No acute intracranial hemorrhage identified. No midline shift, mass effect, or evidence of intracranial mass lesion. No ventriculomegaly. No cortically based acute infarct identified. Bilateral gray-white matter differentiation is within normal limits for age. Vascular: Mild Calcified atherosclerosis at the skull base. No suspicious intracranial vascular hyperdensity. Skull: Negative. Sinuses/Orbits: Visualized paranasal sinuses and mastoids are clear. Other: No acute orbit or scalp soft tissue findings. ASPECTS [facility] Stroke Program Early CT Score) Total score (0-10 with 10 being normal): 10 IMPRESSION: Normal for age non contrast CT appearance of the brain. ASPECTS 10. Electronically Signed: By: Odessa Fleming M.D. On: 05/07/2020 14:52     EKG: Independently reviewed.  Sinus rhythm, QTC 480, LAE, early R wave progression, nonspecific T wave change.  Assessment/Plan Principal Problem:   Stroke [facility]) Active Problems:   GERD (gastroesophageal reflux disease)   Hypercholesteremia   Hypertension   S/P mitral valve replacement   CKD (chronic kidney disease), stage III   Possible stroke vs. TIA: CT-head negative.  Dr. Thad Ranger of neurology is consulted.  He recommended to start patient with Plavix and aspirin. - Placed on MedSurg bed for observation - will follow up Neurology's Recs.  - Obtain MRI - will hold oral Bp meds to allow permissive HTN in the setting of acute stroke  - Check carotid dopplers  - Continue Plavix and add ASA  - fasting lipid panel and HbA1c  - 2D transthoracic echocardiography  - swallowing screen. If fails, will get SLP - PT/OT consult  GERD (gastroesophageal reflux disease) -protonix  Hypercholesteremia -lipitor  Hypertension -hold lasix -IV prn hydralazine for SBP  >220, or DBP>120  S/P mitral valve replacement: Recently performed replacement 04/12/2020 at the [facility] -on amiodarone per Dr. Lady Gary of card -Cardiology is informed by EDP  CKD (chronic kidney disease), stage IIIa: Stable -Follow-up with BMP    DVT ppx: SQ Lovenox Code Status: Full code Family Communication:   Yes, patient's wife at bed side Disposition Plan:  Anticipate discharge back to previous home environment Consults called: Neurology, Dr. Thad Ranger Admission status: Med-surg bed for obs      Status is: Observation  The patient remains OBS appropriate and will d/c before 2 midnights.  Dispo: The patient is from: Home              Anticipated d/c is to: Home  Anticipated d/c date is: 1 day              Patient currently is not medically stable to d/c.          Date of Service 05/07/2020    Lorretta Harp [facility]   If 7PM-7AM, please contact night-coverage [email or web address] 05/07/2020, 7:50 PM

## 2020-05-07 NOTE — ED Triage Notes (Signed)
Code [place] called  

## 2020-05-07 NOTE — ED Notes (Signed)
Pt in MRI.

## 2020-05-07 NOTE — ED Provider Notes (Signed)
[facility] Emergency Department Provider Note  ____________________________________________   First MD Initiated Contact with Patient 05/07/20 1440     (approximate)  I have reviewed the triage vital signs and the nursing notes.   HISTORY  Chief Complaint Code Stroke    HPI Terry [name] is a 69 y.o. male status post mitral valve prolapse less than a month ago who comes in with concerns for code stroke.  Patient's last known normal was at 2 PM we returned the shower and then after the other shower he had sudden onset of aphasia and some right arm sensation changes.  The symptoms have gradually gotten better.  Still states he has a little bit of difficulty getting his braces.  He denies any chest pain, worsening shortness of breath.  Is not any blood thinners.  Symptoms were constant, gradually improving, unclear what brought it on, nothing made it worse.          Past Medical History:  Diagnosis Date  . Diverticulosis   . GERD (gastroesophageal reflux disease)   . Heart murmur   . Hypercholesteremia   . Hypertension   . Mitral valve prolapse     Patient Active Problem List   Diagnosis Date Noted  . Leg pain 09/27/2017  . 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 Venous insufficiency 09/27/2017  . GERD (gastroesophageal reflux disease) 09/27/2017    Past Surgical History:  Procedure Laterality Date  . COLONOSCOPY WITH PROPOFOL N/A 03/06/2020   Procedure: COLONOSCOPY WITH PROPOFOL;  Surgeon: Toledo, Boykin Nearing, MD;  Location: [facility] ENDOSCOPY;  Service: Gastroenterology;  Laterality: N/A;  . MITRAL VALVE REPLACEMENT    . RETINAL DETACHMENT SURGERY    . RIGHT/LEFT HEART CATH AND CORONARY ANGIOGRAPHY Bilateral 02/18/2017   Procedure: Right/Left Heart Cath and Coronary Angiography;  Surgeon: Marcina Millard, MD;  Location: [facility] INVASIVE CV LAB;  Service: Cardiovascular;  Laterality: Bilateral;  . RIGHT/LEFT HEART CATH AND CORONARY ANGIOGRAPHY N/A  02/22/2020   Procedure: RIGHT/LEFT HEART CATH AND CORONARY ANGIOGRAPHY;  Surgeon: Marcina Millard, MD;  Location: [facility] INVASIVE CV LAB;  Service: Cardiovascular;  Laterality: N/A;  . TEMPOROMANDIBULAR JOINT SURGERY      Prior to Admission medications   Medication Sig Start Date End Date Taking? Authorizing Provider  B Complex Vitamins (B COMPLEX 100 PO) Take 1 capsule by mouth every evening.    [provider]  clobetasol ointment (TEMOVATE) 0.05 % Apply 1 application topically daily as needed (skin irritation).  01/07/17   [provider]  Multiple Vitamin (MULTIVITAMIN WITH MINERALS) TABS tablet Take 1 tablet by mouth at bedtime.    [provider]  omeprazole (PRILOSEC) 20 MG capsule Take 20 mg by mouth every evening.  01/11/17   [provider]  Probiotic Product (PROBIOTIC PO) Take 1 capsule by mouth at bedtime.     [provider]  triamcinolone cream (KENALOG) 0.1 % Apply 1 application topically daily as needed (skin irritation.).  01/11/17   [provider]  valACYclovir (VALTREX) 500 MG tablet Take 500 mg by mouth every evening.  01/11/17   [provider]    Allergies Penicillins, Ciprofloxacin, and Ivp dye [iodinated diagnostic agents]  Family History  Problem Relation Age of Onset  . Varicose Veins Mother   . Cancer Father     Social History Social History   Tobacco Use  . Smoking status: Former Smoker    Quit date: 1985    Years since quitting: 36.3  . Smokeless  tobacco: Never Used  Substance Use Topics  . Alcohol use: Yes    Comment: ocassionally  . Drug use: No      Review of Systems Constitutional: No fever/chills Eyes: No visual changes. ENT: No sore throat. Cardiovascular: Denies chest pain. Respiratory: Denies shortness of breath. Gastrointestinal: No abdominal pain.  No nausea, no vomiting.  No diarrhea.  No constipation. Genitourinary: Negative for dysuria. Musculoskeletal: Negative  for back pain. Skin: Negative for rash. Neurological: Positive right arm sensation changes, difficulty speaking All other ROS negative ____________________________________________   PHYSICAL EXAM:  VITAL SIGNS: ED Triage Vitals  Enc Vitals Group     BP 05/07/20 1433 137/89     Pulse Rate 05/07/20 1433 92     Resp 05/07/20 1433 16     Temp --      Temp Source 05/07/20 1433 Oral     SpO2 05/07/20 1433 99 %     Weight 05/07/20 1435 175 lb (79.4 kg)     Height 05/07/20 1435 5\' 6"  (1.676 m)     Head Circumference --      Peak Flow --      Pain Score 05/07/20 1435 0     Pain Loc --      Pain Edu? --      Excl. in [place]? --     Constitutional: Alert and oriented. Well appearing and in no acute distress. Eyes: Conjunctivae are normal. EOMI. Head: Atraumatic. Nose: No congestion/rhinnorhea. Mouth/Throat: Mucous membranes are moist.   Neck: No stridor. Trachea Midline. FROM Cardiovascular: Normal rate, regular rhythm. Grossly normal heart sounds.  Good peripheral circulation.  Scar noted Respiratory: Normal respiratory effort.  No retractions. Lungs CTAB. Gastrointestinal: Soft and nontender. No distention. No abdominal bruits.  Musculoskeletal: No lower extremity tenderness nor edema.  No joint effusions. Neurologic: NIH stroke scale was 1 for slight aphasia otherwise equal strength in his arms and legs and no current sensation changes in his arms or leg Skin:  Skin is warm, dry and intact. No rash noted. Psychiatric: Mood and affect are normal. Speech and behavior are normal. GU: Deferred   ____________________________________________   LABS (all labs ordered are listed, but only abnormal results are displayed)  Labs Reviewed  CBC - Abnormal; Notable for the following components:      Result Value   RBC 4.11 (*)    All other components within normal limits  COMPREHENSIVE METABOLIC PANEL - Abnormal; Notable for the following components:   Glucose, Bld 134 (*)    Creatinine,  Ser 1.37 (*)    GFR calc non Af Amer 52 (*)    All other components within normal limits  GLUCOSE, CAPILLARY - Abnormal; Notable for the following components:   Glucose-Capillary 133 (*)    All other components within normal limits  PROTIME-INR  APTT  DIFFERENTIAL  CBG MONITORING, ED  I-STAT CREATININE, ED   ____________________________________________   ED ECG REPORT I, Vanessa Justice, the attending physician, personally viewed and interpreted this ECG.  EKG is sinus rate of 88, no ST elevation, no T wave inversions, normal intervals ____________________________________________  RADIOLOGY   Official radiology report(s): DG Chest Portable 1 View  Result Date: 05/07/2020 CLINICAL DATA:  Right arm tingling EXAM: PORTABLE CHEST 1 VIEW COMPARISON:  None. FINDINGS: Linear atelectasis or scarring in the right mid lung. Cardiomegaly. Left lung clear. No effusions. No acute bony abnormality. IMPRESSION: Right mid lung atelectasis or scarring. Cardiomegaly. Electronically Signed   By: Rolm Baptise M.D.  On: 05/07/2020 15:37   CT HEAD CODE STROKE WO CONTRAST  Addendum Date: 05/07/2020   ADDENDUM REPORT: 05/07/2020 15:09 ADDENDUM: Study discussed by telephone with Dr. Artis Delay on 05/07/2020 at 1459 hours. Electronically Signed   By: Odessa Fleming M.D.   On: 05/07/2020 15:09   Result Date: 05/07/2020 CLINICAL DATA:  Code stroke. 69 year old male with right arm numbness beginning at 1345 hours today and slurred speech. EXAM: CT HEAD WITHOUT CONTRAST TECHNIQUE: Contiguous axial images were obtained from the base of the skull through the vertex without intravenous contrast. COMPARISON:  None. FINDINGS: Brain: No acute intracranial hemorrhage identified. No midline shift, mass effect, or evidence of intracranial mass lesion. No ventriculomegaly. No cortically based acute infarct identified. Bilateral gray-white matter differentiation is within normal limits for age. Vascular: Mild Calcified  atherosclerosis at the skull base. No suspicious intracranial vascular hyperdensity. Skull: Negative. Sinuses/Orbits: Visualized paranasal sinuses and mastoids are clear. Other: No acute orbit or scalp soft tissue findings. ASPECTS [facility] Stroke Program Early CT Score) Total score (0-10 with 10 being normal): 10 IMPRESSION: Normal for age non contrast CT appearance of the brain. ASPECTS 10. Electronically Signed: By: Odessa Fleming M.D. On: 05/07/2020 14:52    ____________________________________________   PROCEDURES  Procedure(s) performed (including Critical Care):  Procedures   ____________________________________________   INITIAL IMPRESSION / ASSESSMENT AND PLAN / ED COURSE  Terry [name] was evaluated in Emergency Department on 05/07/2020 for the symptoms described in the history of present illness. He was evaluated in the context of the global COVID-19 pandemic, which necessitated consideration that the patient might be at risk for infection with the SARS-CoV-2 virus that causes COVID-19. Institutional protocols and algorithms that pertain to the evaluation of patients at risk for COVID-19 are in a state of rapid change based on information released by regulatory bodies including the CDC and federal and state organizations. These policies and algorithms were followed during the patient's care in the ED.     Patient comes in with sudden onset right arm sensation changes and difficulty speaking.  Stroke code was called due to concern for ischemic stroke.  Will get CT head evaluate for intracranial hemorrhage.  Will get labs to evaluate Electra abnormalities, hypoglycemia.  Will get EKG to evaluate for A. fib, chest x-ray to evaluate for widened mediastinum given recent surgery.  Patient has no chest pain, shortness of breath given the aphasia associated with this I have low suspicion that this is a dissection or cardiac etiology.  I do not think troponin would be useful given that it would be most  likely elevated due to the recent surgery.  Dr. Thad Ranger at bedside the patient, she has an NIH stroke scale of 1 therefore patient is not a thrombectomy candidate so do not need to do a CTA.  Patient also not a TPA candidate due to his recent open heart surgery.  She recommends admission for MRI, echocardiogram and stroke work-up.  Patient was post have a follow-up appoint with Dr. Darrold Junker tomorrow I did alert his cardiology team as well that patient was going to be admitted to the hospital  D/w rads about CT- normal for age   Labs are reassuring  No wide mediastinum  We will discuss with the hospital team for admission.      ____________________________________________   FINAL CLINICAL IMPRESSION(S) / ED DIAGNOSES   Final diagnoses:  Slurred speech      MEDICATIONS GIVEN DURING THIS VISIT:  Medications  sodium chloride flush (NS)  0.9 % injection 3 mL ( Intravenous Canceled Entry 05/07/20 1510)  clopidogrel (PLAVIX) tablet 150 mg (has no administration in time range)  aspirin EC tablet 162 mg (has no administration in time range)     ED Discharge Orders    None       Note:  This document was prepared using Dragon voice recognition software and may include unintentional dictation errors.   Concha Se, MD 05/07/20 [phone]

## 2020-05-07 NOTE — ED Notes (Signed)
Called CODE STROKE to emergency line

## 2020-05-07 NOTE — ED Notes (Signed)
Neurologist at bedside. 

## 2020-05-08 ENCOUNTER — Inpatient Hospital Stay
Admission: EM | Admit: 2020-05-08 | Discharge: 2020-05-08 | Disposition: A | Discharge status: Home / Self Care | Payer: Medicare HMO | Source: Home / Self Care | Attending: Internal Medicine | Admitting: Internal Medicine

## 2020-05-08 ENCOUNTER — Observation Stay: Payer: Medicare HMO

## 2020-05-08 DIAGNOSIS — I639 Cerebral infarction, unspecified: Secondary | ICD-10-CM | POA: Diagnosis present

## 2020-05-08 DIAGNOSIS — K219 Gastro-esophageal reflux disease without esophagitis: Secondary | ICD-10-CM | POA: Diagnosis present

## 2020-05-08 DIAGNOSIS — R4781 Slurred speech: Secondary | ICD-10-CM | POA: Diagnosis present

## 2020-05-08 DIAGNOSIS — Z87891 Personal history of nicotine dependence: Secondary | ICD-10-CM | POA: Diagnosis not present

## 2020-05-08 DIAGNOSIS — E7800 Pure hypercholesterolemia, unspecified: Secondary | ICD-10-CM | POA: Diagnosis present

## 2020-05-08 DIAGNOSIS — N1831 Chronic kidney disease, stage 3a: Secondary | ICD-10-CM | POA: Diagnosis not present

## 2020-05-08 DIAGNOSIS — N183 Chronic kidney disease, stage 3 unspecified: Secondary | ICD-10-CM | POA: Diagnosis present

## 2020-05-08 DIAGNOSIS — Z20822 Contact with and (suspected) exposure to covid-19: Secondary | ICD-10-CM | POA: Diagnosis present

## 2020-05-08 DIAGNOSIS — R4701 Aphasia: Secondary | ICD-10-CM | POA: Diagnosis present

## 2020-05-08 DIAGNOSIS — Z79899 Other long term (current) drug therapy: Secondary | ICD-10-CM | POA: Diagnosis not present

## 2020-05-08 DIAGNOSIS — I480 Paroxysmal atrial fibrillation: Secondary | ICD-10-CM | POA: Diagnosis present

## 2020-05-08 DIAGNOSIS — Z91041 Radiographic dye allergy status: Secondary | ICD-10-CM | POA: Diagnosis not present

## 2020-05-08 DIAGNOSIS — R29701 NIHSS score 1: Secondary | ICD-10-CM | POA: Diagnosis present

## 2020-05-08 DIAGNOSIS — H9190 Unspecified hearing loss, unspecified ear: Secondary | ICD-10-CM | POA: Diagnosis present

## 2020-05-08 DIAGNOSIS — Z880 Allergy status to penicillin: Secondary | ICD-10-CM | POA: Diagnosis not present

## 2020-05-08 DIAGNOSIS — I10 Essential (primary) hypertension: Secondary | ICD-10-CM | POA: Diagnosis not present

## 2020-05-08 DIAGNOSIS — I129 Hypertensive chronic kidney disease with stage 1 through stage 4 chronic kidney disease, or unspecified chronic kidney disease: Secondary | ICD-10-CM | POA: Diagnosis present

## 2020-05-08 DIAGNOSIS — Z881 Allergy status to other antibiotic agents status: Secondary | ICD-10-CM | POA: Diagnosis not present

## 2020-05-08 DIAGNOSIS — Z952 Presence of prosthetic heart valve: Secondary | ICD-10-CM | POA: Diagnosis not present

## 2020-05-08 LAB — LIPID PANEL
Cholesterol: 230 mg/dL — ABNORMAL HIGH (ref 0–200)
HDL: 30 mg/dL — ABNORMAL LOW (ref >40)
LDL Cholesterol: 172 mg/dL — ABNORMAL HIGH (ref 0–99)
Total CHOL/HDL Ratio: 7.7 RATIO
Triglycerides: 142 mg/dL (ref <150)
VLDL: 28 mg/dL (ref 0–40)

## 2020-05-08 LAB — ECHOCARDIOGRAM COMPLETE
Height: 66 in
Weight: 2800 oz

## 2020-05-08 LAB — HEMOGLOBIN A1C
Hgb A1c MFr Bld: 5.4 % (ref 4.8–5.6)
Mean Plasma Glucose: 108.28 mg/dL

## 2020-05-08 LAB — HIV ANTIBODY (ROUTINE TESTING W REFLEX): HIV Screen 4th Generation wRfx: NONREACTIVE

## 2020-05-08 MED ORDER — APIXABAN 5 MG PO TABS: 5.0000 mg | ORAL_TABLET | Freq: Two times a day (BID) | ORAL | Status: DC

## 2020-05-08 MED ORDER — ENSURE ENLIVE PO LIQD
237.0000 mL | Freq: Two times a day (BID) | ORAL | Status: DC
  Administered 2020-05-09: 08:00:00 237 mL via ORAL

## 2020-05-08 MED ORDER — APIXABAN 5 MG PO TABS
5.0000 mg | ORAL_TABLET | Freq: Two times a day (BID) | ORAL | Status: DC
  Administered 2020-05-08 – 2020-05-09 (×2): 5 mg via ORAL
  Filled 2020-05-08 (×2): qty 1

## 2020-05-08 NOTE — Progress Notes (Signed)
Pt demonstrates decrease in exspressive aphagia. With use of iNIH stroke scale screening pt shows resolution of expressive aphagia. When given other materials to read and name demonstration of some loss of fluency still stands. Pt demonstrates exemplarily effort to exercise practice of though articulation.

## 2020-05-08 NOTE — Progress Notes (Signed)
Pt ambulated around unit 7 times with standy by assist tolerated well.

## 2020-05-08 NOTE — Progress Notes (Addendum)
Patient Name: Terry [name] Date of Encounter: 05/08/2020  Hospital Problem List     Principal Problem:   Stroke [facility]) Active Problems:   GERD (gastroesophageal reflux disease)   Hypercholesteremia   Hypertension   S/P mitral valve replacement   CKD (chronic kidney disease), stage III    Patient Profile      68 y.o. male with history of HTN, Hyperlipidemia and severe mitral valve prolapse who is s/p surgical mvr on 04/12/20 at [facility]. He awoke this am and noted right arm numbness and difficulty speaking. He came to the er for evaluation. EKG showed nsr with no ischemia. Head CT was unremarkable. Pt was on amiodarone since discharge from [facility] due to atrial arrhythmia post op. He is unaware of afib in the past. He was on plavix and asa along with atorvastatin  Subjective   Still somewhat difficult speaking however feels improved.  Inpatient Medications    . amiodarone  200 mg Oral Daily  . aspirin EC  81 mg Oral Once  . atorvastatin  40 mg Oral Daily  . clopidogrel  75 mg Oral Once  . enoxaparin (LOVENOX) injection  40 mg Subcutaneous Q24H  . multivitamin with minerals  1 tablet Oral q morning - 10a  . pantoprazole  40 mg Oral QHS  . sodium chloride flush  3 mL Intravenous Once  . valACYclovir  500 mg Oral QHS    Vital Signs    Vitals:   05/08/20 0000 05/08/20 0049 05/08/20 0302 05/08/20 0545  BP: 134/84  128/86   Pulse:   83   Resp: 18  18   Temp:  (!) 97.3 F (36.3 C) 98.5 F (36.9 C) (!) 97.4 F (36.3 C)  TempSrc:  Axillary Oral Oral  SpO2:   100%   Weight:      Height:        Intake/Output Summary (Last 24 hours) at 05/08/2020 0825 Last data filed at 05/08/2020 0100 Gross per 24 hour  Intake 261.93 ml  Output --  Net 261.93 ml   Filed Weights   05/07/20 1435  Weight: 79.4 kg    Physical Exam    GEN: Well nourished, well developed, in no acute distress.  HEENT: normal.  Neck: Supple, no JVD, carotid bruits, or masses. Cardiac: RRR, no murmurs, rubs,  or gallops. No clubbing, cyanosis, edema.  Radials/DP/PT 2+ and equal bilaterally.  Respiratory:  Respirations regular and unlabored, clear to auscultation bilaterally. GI: Soft, nontender, nondistended, BS + x 4. MS: no deformity or atrophy. Skin: warm and dry, no rash. Neuro:  Strength and sensation are intact. Psych: Normal affect.  Labs    CBC Recent Labs    05/07/20 1437  WBC 4.7  NEUTROABS 2.5  HGB 13.7  HCT 39.2  MCV 95.4  PLT 202   Basic Metabolic Panel Recent Labs    03/55/97 1437  NA 139  K 3.9  CL 105  CO2 25  GLUCOSE 134*  BUN 13  CREATININE 1.37*  CALCIUM 9.6   Liver Function Tests Recent Labs    05/07/20 1437  AST 21  ALT 22  ALKPHOS 74  BILITOT 0.7  PROT 7.5  ALBUMIN 3.9   No results for input(s): LIPASE, AMYLASE in the last 72 hours. Cardiac Enzymes No results for input(s): CKTOTAL, CKMB, CKMBINDEX, TROPONINI in the last 72 hours. BNP No results for input(s): BNP in the last 72 hours. D-Dimer No results for input(s): DDIMER in the last 72 hours. Hemoglobin A1C  No results for input(s): HGBA1C in the last 72 hours. Fasting Lipid Panel Recent Labs    05/08/20 0324  CHOL 230*  HDL 30*  LDLCALC 172*  TRIG 142  CHOLHDL 7.7   Thyroid Function Tests No results for input(s): TSH, T4TOTAL, T3FREE, THYROIDAB in the last 72 hours.  Invalid input(s): FREET3  Telemetry    Sinus rhythm with PACs  ECG    Sinus rhythm with PACs.  No ischemia.  Radiology    MR BRAIN WO CONTRAST  Result Date: 05/07/2020 CLINICAL DATA:  Stroke. History of mitral valve replacement 04/12/2020 EXAM: MRI HEAD WITHOUT CONTRAST TECHNIQUE: Multiplanar, multiecho pulse sequences of the brain and surrounding structures were obtained without intravenous contrast. COMPARISON:  CT head 05/07/2020 FINDINGS: Brain: Multiple areas of restricted diffusion compatible with acute infarct. Small acute infarcts in the cerebellum bilaterally. Small acute cortical infarcts in the  right parietal lobe and in the right parietal white matter. Acute infarct in the left operculum and insula. Negative for hemorrhage or mass. Ventricle size and cerebral volume normal for age. Vascular: Normal arterial flow voids Skull and upper cervical spine: No focal skeletal lesion. Sinuses/Orbits: Mild mucosal edema paranasal sinuses. Right cataract extraction. No orbital lesion Other: None IMPRESSION: Multiple areas of acute infarct including both cerebral hemispheres and in the cerebellum bilaterally. Findings compatible with emboli. No hemorrhage or mass. Electronically Signed   By: Franchot Gallo M.D.   On: 05/07/2020 18:52   DG Chest Portable 1 View  Result Date: 05/07/2020 CLINICAL DATA:  Right arm tingling EXAM: PORTABLE CHEST 1 VIEW COMPARISON:  None. FINDINGS: Linear atelectasis or scarring in the right mid lung. Cardiomegaly. Left lung clear. No effusions. No acute bony abnormality. IMPRESSION: Right mid lung atelectasis or scarring. Cardiomegaly. Electronically Signed   By: Rolm Baptise M.D.   On: 05/07/2020 15:37   CT HEAD CODE STROKE WO CONTRAST  Addendum Date: 05/07/2020   ADDENDUM REPORT: 05/07/2020 15:09 ADDENDUM: Study discussed by telephone with Dr. Marjean Donna on 05/07/2020 at 1459 hours. Electronically Signed   By: Genevie Ann M.D.   On: 05/07/2020 15:09   Result Date: 05/07/2020 CLINICAL DATA:  Code stroke. 69 year old male with right arm numbness beginning at 1345 hours today and slurred speech. EXAM: CT HEAD WITHOUT CONTRAST TECHNIQUE: Contiguous axial images were obtained from the base of the skull through the vertex without intravenous contrast. COMPARISON:  None. FINDINGS: Brain: No acute intracranial hemorrhage identified. No midline shift, mass effect, or evidence of intracranial mass lesion. No ventriculomegaly. No cortically based acute infarct identified. Bilateral gray-white matter differentiation is within normal limits for age. Vascular: Mild Calcified atherosclerosis at the  skull base. No suspicious intracranial vascular hyperdensity. Skull: Negative. Sinuses/Orbits: Visualized paranasal sinuses and mastoids are clear. Other: No acute orbit or scalp soft tissue findings. ASPECTS [facility] Stroke Program Early CT Score) Total score (0-10 with 10 being normal): 10 IMPRESSION: Normal for age non contrast CT appearance of the brain. ASPECTS 10. Electronically Signed: By: Genevie Ann M.D. On: 05/07/2020 14:52    Assessment & Plan    69 y.o. male with history of HTN, Hyperlipidemia and severe mitral valve prolapse who is s/p surgical mvr on 04/12/20 at [facility]. He awoke this am and noted right arm numbness and difficulty speaking. He came to the er for evaluation. EKG showed nsr with no ischemia. Head CT was unremarkable. Pt was on amiodarone since discharge from [place] due to atrial arrhythmia post op. He is unaware of afib in the past.  He was on plavix and asa along with atorvastatin . He denies chest pain or sob and his neurologic symptoms are improving. Brain MRI shows bilateral multiple areas of acute infarct c/w emboli.  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 GIven scenario, this is likely  paroxysmal afib.  Currently on aspirin and Plavix.  Will need holter as outpatient.  Pt has no presurgical documented afib but had severe mr with severe dilated left atrium. Was placed on amiodarone during [facility] admission. In nsr at present. Will continue with amiodarone at 200 mg daily and will d/c plavix and place on eliquis 5 mg bid. Echo showed no cardiac source of emboli. Melven Sartorius Ardell Makarewicz MD 05/08/2020, 8:25 AM  Pager: [phone]

## 2020-05-08 NOTE — Progress Notes (Signed)
*  PRELIMINARY RESULTS* Echocardiogram 2D Echocardiogram has been performed.  Terry [name] 05/08/2020, 1:41 PM

## 2020-05-08 NOTE — Progress Notes (Signed)
   05/08/20 1045  Clinical Encounter Type  Visited With Patient and family together  Visit Type Initial  Referral From Nurse  Consult/Referral To Chaplain  Chaplain visited with patient in response to an OR. Patient's sister was in the room when chaplain arrived and shortly thereafter patient's wife came in. Chaplain with AD with them and told them when they are ready to complete the form to have her page. Patient said he was going to take a nap.

## 2020-05-08 NOTE — Evaluation (Signed)
Occupational Therapy Evaluation Patient Details Name: Terry [name] MRN: [id number] DOB: May 14, 1951 Today's Date: 05/08/2020    History of Present Illness Pt is a 69 y.o. male presenting to hospital 5/11 with R UE numbness and wording finding deficits/difficulty speaking/slurred speech.  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  PMH includes htn, mitral valve prolapse (s/p MVR 04/12/20).   Clinical Impression   Terry [name] was seen for OT evaluation this date. Prior to hospital admission, pt was generally independent in all aspects of ADL/IADL. He reports using a 4WW for community mobility since his MVR sx approximately 1 month ago, and denies falls history in past 12 months. Pt lives with his spouse in a 1 story home with 4 steps to enter. Currently pt reporting symptoms of R sided weakness and numbness have resolved. He reports only remaining deficit is his speech, which he and family members at bedside state has improved significantly since initial presentation to the hospital. Pt demonstrates baseline independence to perform ADL and mobility tasks and no strength, sensory, coordination, cognitive, or visual deficits appreciated with assessment. No further skilled OT needs identified. Will sign off. Please re-consult if additional OT needs arise during this admission.     Follow Up Recommendations  No OT follow up    Equipment Recommendations  None recommended by OT    Recommendations for Other Services       Precautions / Restrictions Precautions Precautions: Fall Restrictions Weight Bearing Restrictions: No      Mobility Bed Mobility Overal bed mobility: Independent             General bed mobility comments: No difficulties noted semi-supine to/from sitting  Transfers Overall transfer level: Independent Equipment used: None             General transfer comment: steady safe  transfers noted    Balance Overall balance assessment: Needs assistance Sitting-balance support: No upper extremity supported;Feet supported Sitting balance-Leahy Scale: Normal Sitting balance - Comments: steady sitting reaching outside BOS   Standing balance support: No upper extremity supported;During functional activity Standing balance-Leahy Scale: Good Standing balance comment: no LOB appreciated with assessment this date.               High Level Balance Comments: No loss of balance with ambulation and head turns R/L           ADL either performed or assessed with clinical judgement   ADL Overall ADL's : At baseline                                       General ADL Comments: Pt reports he generally feels back to baseline level of functional perfromance. Presents with no strength, sensory, or coordination deficits this date. Is able to perform bed and functional mobility w/o assist or device. Wife at bedside states pt has been up to bathroom in room, and did not require assist. I or MOD I for ADL Management at this time.     Vision Baseline Vision/History: Wears glasses Wears Glasses: Reading only Patient Visual Report: No change from baseline Vision Assessment?: Yes;No apparent visual deficits Eye Alignment: Within Functional Limits Ocular Range of Motion: Within Functional Limits Alignment/Gaze Preference: Within Defined Limits Tracking/Visual Pursuits: Able to track stimulus in all quads without difficulty Saccades: Within functional limits;Additional eye shifts occurred during testing;Additional  head turns occurred during testing Visual Fields: No apparent deficits     Perception     Praxis      Pertinent Vitals/Pain Pain Assessment: No/denies pain     Hand Dominance Right   Extremity/Trunk Assessment Upper Extremity Assessment Upper Extremity Assessment: Overall WFL for tasks assessed(BUE WNL. Pt endorses hx of R rotator cuff repair.  AROM, strength, sensation, and FMC all WFL this date. Pt endorses R side numbness has totally resolved.)   Lower Extremity Assessment Lower Extremity Assessment: Overall WFL for tasks assessed;Defer to PT evaluation   Cervical / Trunk Assessment Cervical / Trunk Assessment: Normal   Communication Communication Communication: No difficulties   Cognition Arousal/Alertness: Awake/alert Behavior During Therapy: WFL for tasks assessed/performed Overall Cognitive Status: Within Functional Limits for tasks assessed                                     General Comments       Exercises Other Exercises Other Exercises: Pt and caregivers (wife and mother present in room t/o evaluation) educated on falls prevention strategies, role of OT in acute setting, and routines modifications to support safety and fxl independence upon hospital DC.   Shoulder Instructions      Home Living Family/patient expects to be discharged to:: Private residence Living Arrangements: Spouse/significant other Available Help at Discharge: Family Type of Home: House Home Access: Stairs to enter Secretary/administrator of Steps: 4 Entrance Stairs-Rails: None Home Layout: One level     Bathroom Shower/Tub: Chief Strategy Officer: Standard     Home Equipment: Cane - single point;Walker - 4 wheels      Lives With: Spouse    Prior Functioning/Environment Level of Independence: Independent with assistive device(s)        Comments: Was using rollator but using cane in last week.  No falls in past 6 months. Reports being generally independent with ADL/IADL. Has recovered well from past sx.        OT Problem List: Impaired sensation;Decreased safety awareness;Decreased knowledge of use of DME or AE;Impaired UE functional use;Decreased activity tolerance      OT Treatment/Interventions:      OT Goals(Current goals can be found in the care plan section) Acute Rehab OT  Goals Patient Stated Goal: to go home OT Goal Formulation: All assessment and education complete, DC therapy Time For Goal Achievement: 05/08/20 Potential to Achieve Goals: Good  OT Frequency:     Barriers to D/C:            Co-evaluation              AM-PAC OT "6 Clicks" Daily Activity     Outcome Measure Help from another person eating meals?: None Help from another person taking care of personal grooming?: None Help from another person toileting, which includes using toliet, bedpan, or urinal?: None Help from another person bathing (including washing, rinsing, drying)?: None Help from another person to put on and taking off regular upper body clothing?: None Help from another person to put on and taking off regular lower body clothing?: None 6 Click Score: 24   End of Session    Activity Tolerance: Patient tolerated treatment well Patient left: in bed;with call bell/phone within reach;with family/visitor present  OT Visit Diagnosis: Other abnormalities of gait and mobility (R26.89);Other symptoms and signs involving the nervous system (R29.898)  Time: 1353-1420 OT Time Calculation (min): 27 min Charges:  OT General Charges $OT Visit: 1 Visit OT Evaluation $OT Eval Low Complexity: 1 Low OT Treatments $Self Care/Home Management : 8-22 mins  Shara Blazing, M.S., OTR/L Ascom: [phone] 05/08/20, 3:37 PM

## 2020-05-08 NOTE — Evaluation (Signed)
Physical Therapy Evaluation Patient Details Name: Terry [name] MRN: [id number] DOB: [birth year] Today's Date: 05/08/2020   History of Present Illness  Pt is a 69 y.o. male presenting to hospital 5/11 with R UE numbness and wording finding deficits/difficulty speaking/slurred speech.  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  PMH includes htn, mitral valve prolapse (s/p MVR 04/12/20).  Clinical Impression  Prior to hospital admission, pt was modified independent with ambulation (most recently using cane) and lives with his wife in 1 level home (4 STE no railing).  Currently pt is independent with bed mobility, independent with transfers, and SBA with ambulation 500 feet with SPC.  Overall pt steady and safe with functional mobility using SPC.  Speech impairments noted (pt and pt's wife report pt's speech is a lot better).  Anticipate 1 more therapy session to trial stairs.  Upon hospital discharge, anticipate no further PT needs.    Follow Up Recommendations No PT follow up    Equipment Recommendations  None recommended by PT(pt has cane at home already)    Recommendations for Other Services       Precautions / Restrictions Precautions Precautions: Fall Restrictions Weight Bearing Restrictions: No      Mobility  Bed Mobility Overal bed mobility: Independent             General bed mobility comments: No difficulties noted semi-supine to/from sitting  Transfers Overall transfer level: Independent Equipment used: None             General transfer comment: steady safe transfers noted  Ambulation/Gait Ambulation/Gait assistance: Supervision Gait Distance (Feet): 500 Feet Assistive device: Straight cane Gait Pattern/deviations: Step-through pattern   Gait velocity interpretation: >2.62 ft/sec, indicative of community ambulatory General Gait Details: steady ambulation; SBA for IV  pole  Stairs Stairs: (Deferred d/t short IV line)          Wheelchair Mobility    Modified Rankin (Stroke Patients Only)       Balance Overall balance assessment: Needs assistance Sitting-balance support: No upper extremity supported;Feet supported Sitting balance-Leahy Scale: Normal Sitting balance - Comments: steady sitting reaching outside BOS   Standing balance support: No upper extremity supported;During functional activity Standing balance-Leahy Scale: Good Standing balance comment: steady ambulation               High Level Balance Comments: No loss of balance with ambulation and head turns R/L             Pertinent Vitals/Pain Pain Assessment: No/denies pain  Vitals (HR and O2 on room air) stable and WFL throughout treatment session.    Home Living Family/patient expects to be discharged to:: Private residence Living Arrangements: Spouse/significant other Available Help at Discharge: Family Type of Home: House Home Access: Stairs to enter Entrance Stairs-Rails: None Entrance Stairs-Number of Steps: 4 Home Layout: One level Home Equipment: [place] - single point;Walker - 4 wheels      Prior Function Level of Independence: Independent with assistive device(s)         Comments: Was using rollator but using cane in last week.  No falls in past 6 months.     Hand Dominance   Dominant Hand: Right    Extremity/Trunk Assessment   Upper Extremity Assessment Upper Extremity Assessment: Defer to OT evaluation;Overall [facility] for tasks assessed    Lower Extremity Assessment Lower Extremity Assessment: Overall WFL for tasks assessed(Intact B LE proprioception and heel to  shin coordination; 5/5 B hip flexion, knee flexion/extension, and DF/PF; intact B LE light touch (h/o peripheral neuropathy B feet))    Cervical / Trunk Assessment Cervical / Trunk Assessment: Normal  Communication   Communication: No difficulties  Cognition Arousal/Alertness:  Awake/alert Behavior During Therapy: WFL for tasks assessed/performed Overall Cognitive Status: Within Functional Limits for tasks assessed                                        General Comments   Nursing cleared pt for participation in physical therapy.  Pt agreeable to PT session.    Exercises     Assessment/Plan    PT Assessment Patient needs continued PT services  PT Problem List Decreased mobility;Decreased knowledge of precautions       PT Treatment Interventions DME instruction;Gait training;Stair training;Functional mobility training;Therapeutic activities;Therapeutic exercise;Balance training;Patient/family education    PT Goals (Current goals can be found in the Care Plan section)  Acute Rehab PT Goals Patient Stated Goal: to go home PT Goal Formulation: With patient Time For Goal Achievement: 05/22/20 Potential to Achieve Goals: Good    Frequency 7X/week   Barriers to discharge        Co-evaluation               AM-PAC PT "6 Clicks" Mobility  Outcome Measure Help needed turning from your back to your side while in a flat bed without using bedrails?: None Help needed moving from lying on your back to sitting on the side of a flat bed without using bedrails?: None Help needed moving to and from a bed to a chair (including a wheelchair)?: None Help needed standing up from a chair using your arms (e.g., wheelchair or bedside chair)?: None Help needed to walk in hospital room?: A Little Help needed climbing 3-5 steps with a railing? : A Little 6 Click Score: 22    End of Session Equipment Utilized During Treatment: Gait belt Activity Tolerance: Patient tolerated treatment well Patient left: in bed;with call bell/phone within reach;with bed alarm set;with nursing/sitter in room Nurse Communication: Mobility status;Precautions PT Visit Diagnosis: Other abnormalities of gait and mobility (R26.89)    Time: 1610-9604 PT Time Calculation  (min) (ACUTE ONLY): 27 min   Charges:   PT Evaluation $PT Eval Low Complexity: 1 Low         Annessa Satre, PT 05/08/20, 12:32 PM

## 2020-05-08 NOTE — Progress Notes (Signed)
CH paged to ED for CODE STROKE; wife said pt. in [place] when [facility] arrived.  Wife appeared to be coping effectively --> calm, relatively collected.  Pt. returned to rm in wheelchair shortly after [facility] arrival.  Neurologist commenced stroke eval; CH introduced himself after neurologist finished workup --> made availability known to pt./wife.  Will follow up if needed.    05/07/20 1400  Clinical Encounter Type  Visited With Patient and family together  Visit Type Initial;Code;Psychological support;Social support (CODE STROKE)  Referral From Nurse  Spiritual Encounters  Spiritual Needs Emotional  Stress Factors  Family Stress Factors Health changes;Loss of control;Major life changes

## 2020-05-08 NOTE — Progress Notes (Signed)
Subjective: No new neurological complaints.  Patient reports feeling back to baseline.    Objective: Current vital signs: BP 131/85 (BP Location: Right Arm)   Pulse 83   Temp (!) 97.5 F (36.4 C) (Oral)   Resp 19   Ht 5\' 6"  (1.676 m)   Wt 79.4 kg   SpO2 99%   BMI 28.25 kg/m  Vital signs in last 24 hours: Temp:  [97.3 F (36.3 C)-98.9 F (37.2 C)] 97.5 F (36.4 C) (05/12 0800) Pulse Rate:  [79-92] 83 (05/12 0302) Resp:  [14-23] 19 (05/12 0800) BP: (114-154)/(73-99) 131/85 (05/12 0800) SpO2:  [92 %-100 %] 99 % (05/12 0800) Weight:  [79.4 kg] 79.4 kg (05/11 1435)  Intake/Output from previous day: 05/11 0701 - 05/12 0700 In: 261.9 [I.V.:261.9] Out: -  Intake/Output this shift: Total I/O In: 240 [P.O.:240] Out: -  Nutritional status:  Diet Order            Diet Heart Room service appropriate? Yes; Fluid consistency: Thin  Diet effective now              Neurologic Exam: Mental Status: Alert, oriented, thought content appropriate.  Speech fluent without evidence of aphasia.  Able to follow 3 step commands without difficulty. Cranial Nerves: II: Discs flat bilaterally; Visual fields grossly normal, pupils equal, round, reactive to light and accommodation III,IV, VI: ptosis not present, extra-ocular motions intact bilaterally V,VII: mild decrease in right NLF, facial light touch sensation normal bilaterally VIII: hearing normal bilaterally IX,X: gag reflex present XI: bilateral shoulder shrug XII: midline tongue extension Motor: Right : Upper extremity   5/5    Left:     Upper extremity   5/5  Lower extremity   5/5     Lower extremity   5/5 Tone and bulk:normal tone throughout; no atrophy noted Sensory: Pinprick and light touch intact throughout, bilaterally   Lab Results: Basic Metabolic Panel: Recent Labs  Lab 05/07/20 1437  NA 139  K 3.9  CL 105  CO2 25  GLUCOSE 134*  BUN 13  CREATININE 1.37*  CALCIUM 9.6    Liver Function Tests: Recent Labs   Lab 05/07/20 1437  AST 21  ALT 22  ALKPHOS 74  BILITOT 0.7  PROT 7.5  ALBUMIN 3.9   No results for input(s): LIPASE, AMYLASE in the last 168 hours. No results for input(s): AMMONIA in the last 168 hours.  CBC: Recent Labs  Lab 05/07/20 1437  WBC 4.7  NEUTROABS 2.5  HGB 13.7  HCT 39.2  MCV 95.4  PLT 202    Cardiac Enzymes: No results for input(s): CKTOTAL, CKMB, CKMBINDEX, TROPONINI in the last 168 hours.  Lipid Panel: Recent Labs  Lab 05/08/20 0324  CHOL 230*  TRIG 142  HDL 30*  CHOLHDL 7.7  VLDL 28  LDLCALC 07/08/20*    CBG: Recent Labs  Lab 05/07/20 1438  GLUCAP 133*    Microbiology: Results for orders placed or performed during the hospital encounter of 05/07/20  SARS Coronavirus 2 by RT PCR (hospital order, performed in [facility] hospital lab) Nasopharyngeal Nasopharyngeal Swab     Status: None   Collection Time: 05/07/20  4:09 PM   Specimen: Nasopharyngeal Swab  Result Value Ref Range Status   SARS Coronavirus 2 NEGATIVE NEGATIVE Final    Comment: (NOTE) SARS-CoV-2 target nucleic acids are NOT DETECTED. The SARS-CoV-2 RNA is generally detectable in upper and lower respiratory specimens during the acute phase of infection. The lowest concentration of SARS-CoV-2 viral copies  this assay can detect is 250 copies / mL. A negative result does not preclude SARS-CoV-2 infection and should not be used as the sole basis for treatment or other patient management decisions.  A negative result may occur with improper specimen collection / handling, submission of specimen other than nasopharyngeal swab, presence of viral mutation(s) within the areas targeted by this assay, and inadequate number of viral copies (<250 copies / mL). A negative result must be combined with clinical observations, patient history, and epidemiological information. Fact Sheet for Patients:   BoilerBrush.com.cy Fact Sheet for Healthcare  Providers: https://pope.com/ This test is not yet approved or cleared  by the [place] FDA and has been authorized for detection and/or diagnosis of SARS-CoV-2 by FDA under an Emergency Use Authorization (EUA).  This EUA will remain in effect (meaning this test can be used) for the duration of the COVID-19 declaration under Section 564(b)(1) of the Act, 21 U.S.C. section 360bbb-3(b)(1), unless the authorization is terminated or revoked sooner. Performed at [facility], [address] [place], [place] [address]     Coagulation Studies: Recent Labs    05/07/20 1437  LABPROT 13.4  INR 1.1    Imaging: MR BRAIN WO CONTRAST  Result Date: 05/07/2020 CLINICAL DATA:  Stroke. History of mitral valve replacement 04/12/2020 EXAM: MRI HEAD WITHOUT CONTRAST TECHNIQUE: Multiplanar, multiecho pulse sequences of the brain and surrounding structures were obtained without intravenous contrast. COMPARISON:  CT head 05/07/2020 FINDINGS: Brain: Multiple areas of restricted diffusion compatible with acute infarct. Small acute infarcts in the cerebellum bilaterally. Small acute cortical infarcts in the right parietal lobe and in the right parietal white matter. Acute infarct in the left operculum and insula. Negative for hemorrhage or mass. Ventricle size and cerebral volume normal for age. Vascular: Normal arterial flow voids Skull and upper cervical spine: No focal skeletal lesion. Sinuses/Orbits: Mild mucosal edema paranasal sinuses. Right cataract extraction. No orbital lesion Other: None IMPRESSION: Multiple areas of acute infarct including both cerebral hemispheres and in the cerebellum bilaterally. Findings compatible with emboli. No hemorrhage or mass. Electronically Signed   By: Marlan Palau M.D.   On: 05/07/2020 18:52   US Carotid Bilateral (at [facility] and [facility] only)  Result Date: 05/08/2020 CLINICAL DATA:  69 year old male with a history of stroke EXAM: BILATERAL  CAROTID DUPLEX ULTRASOUND TECHNIQUE: Wallace Cullens scale imaging, color Doppler and duplex ultrasound were performed of bilateral carotid and vertebral arteries in the neck. COMPARISON:  None. FINDINGS: Criteria: Quantification of carotid stenosis is based on velocity parameters that correlate the residual internal carotid diameter with NASCET-based stenosis levels, using the diameter of the distal internal carotid lumen as the denominator for stenosis measurement. The following velocity measurements were obtained: RIGHT ICA:  Systolic 82 cm/sec, Diastolic 32 cm/sec CCA:  78 cm/sec SYSTOLIC ICA/CCA RATIO:  1.1 ECA:  76 cm/sec LEFT ICA:  Systolic 57 cm/sec, Diastolic 24 cm/sec CCA:  95 cm/sec SYSTOLIC ICA/CCA RATIO:  0.6 ECA:  82 cm/sec Right Brachial SBP: Not acquired Left Brachial SBP: Not acquired RIGHT CAROTID ARTERY: No significant calcified disease of the right common carotid artery. Intermediate waveform maintained. Heterogeneous plaque without significant calcifications at the right carotid bifurcation. Low resistance waveform of the right ICA. No significant tortuosity. RIGHT VERTEBRAL ARTERY: Antegrade flow with low resistance waveform. LEFT CAROTID ARTERY: No significant calcified disease of the left common carotid artery. Intermediate waveform maintained. Heterogeneous plaque at the left carotid bifurcation without significant calcifications. Low resistance waveform of the left ICA. LEFT VERTEBRAL  ARTERY:  Antegrade flow with low resistance waveform. IMPRESSION: Color duplex indicates moderate heterogeneous plaque with no hemodynamically significant stenosis by duplex criteria in the extracranial cerebrovascular circulation. Signed, Dulcy Fanny. Dellia Nims, RPVI [facility] Radiology Electronically Signed   By: Corrie Mckusick D.O.   On: 05/08/2020 10:48   DG Chest Portable 1 View  Result Date: 05/07/2020 CLINICAL DATA:  Right arm tingling EXAM: PORTABLE CHEST 1 VIEW  COMPARISON:  None. FINDINGS: Linear atelectasis or scarring in the right mid lung. Cardiomegaly. Left lung clear. No effusions. No acute bony abnormality. IMPRESSION: Right mid lung atelectasis or scarring. Cardiomegaly. Electronically Signed   By: Rolm Baptise M.D.   On: 05/07/2020 15:37   CT HEAD CODE STROKE WO CONTRAST  Addendum Date: 05/07/2020   ADDENDUM REPORT: 05/07/2020 15:09 ADDENDUM: Study discussed by telephone with Dr. Marjean Donna on 05/07/2020 at 1459 hours. Electronically Signed   By: Genevie Ann M.D.   On: 05/07/2020 15:09   Result Date: 05/07/2020 CLINICAL DATA:  Code stroke. 69 year old male with right arm numbness beginning at 1345 hours today and slurred speech. EXAM: CT HEAD WITHOUT CONTRAST TECHNIQUE: Contiguous axial images were obtained from the base of the skull through the vertex without intravenous contrast. COMPARISON:  None. FINDINGS: Brain: No acute intracranial hemorrhage identified. No midline shift, mass effect, or evidence of intracranial mass lesion. No ventriculomegaly. No cortically based acute infarct identified. Bilateral gray-white matter differentiation is within normal limits for age. Vascular: Mild Calcified atherosclerosis at the skull base. No suspicious intracranial vascular hyperdensity. Skull: Negative. Sinuses/Orbits: Visualized paranasal sinuses and mastoids are clear. Other: No acute orbit or scalp soft tissue findings. ASPECTS [facility] Stroke Program Early CT Score) Total score (0-10 with 10 being normal): 10 IMPRESSION: Normal for age non contrast CT appearance of the brain. ASPECTS 10. Electronically Signed: By: Genevie Ann M.D. On: 05/07/2020 14:52    Medications:  I have reviewed the patient's current medications. Scheduled: . amiodarone  200 mg Oral Daily  . atorvastatin  40 mg Oral Daily  . enoxaparin (LOVENOX) injection  40 mg Subcutaneous Q24H  . multivitamin with minerals  1 tablet Oral q morning - 10a  . pantoprazole  40 mg Oral QHS  . sodium  chloride flush  3 mL Intravenous Once  . valACYclovir  500 mg Oral QHS    Assessment/Plan: 69 y.o. male with a history of HLD, HTN and mitral valve prolapse s/p mitral valve replacement on 04/12/20 who reports waking up at baseline.  Went to take shower and while in the shower noted that his right arm became numb.  Once out of the shower was unable to communicate with his wife.  Patient brought in for evaluation.  Symptoms now resolved.  MRI of the brain personally reviewed and shows multiple small acute infarcts in the bilateral cerebral and cerebellar hemispheres.  On conversation with cardiology there is a concern for atrial fibrillation.  Patient felt to be a candidate for anticoagulation.  Acute infarcts are small and would not be a contraindication.    Carotid dopplers show no evidence of hemodynamically significant stenosis.  Echocardiogram pending.  A1c 5.4, LDL 172.  Stroke Risk Factors - hyperlipidemia and hypertension  Plan: 1. Agree with statin with target LDL<70.   2. Echocardiogram pending 3. Patient to be transitioned to Eliquis. 4. Telemetry monitoring 5. Frequent neuro checks 10. Cardiology evaluation   LOS: 0 days   Alexis Goodell, MD Neurology [phone] 05/08/2020  10:59 AM

## 2020-05-08 NOTE — Progress Notes (Signed)
OT Cancellation Note  Patient Details Name: Terry [name] MRN: [id number] DOB: [birth year]   Cancelled Treatment:    Reason Eval/Treat Not Completed: Patient at procedure or test/ unavailable . Order received and chart reviewed. Upon arrival to pt room, pt with physical therapist for session. Per physical therapist, pt left with hospital transport for [place]. Will hold OT eval at this time and re-attempt as available and pt medically appropriate for OT tx.  Rockney Ghee, M.S., OTR/L Ascom: [phone] 05/08/20, 10:03 AM

## 2020-05-08 NOTE — Progress Notes (Signed)
Initial Nutrition Assessment  DOCUMENTATION CODES:   Not applicable  INTERVENTION:  Provide Ensure Enlive po BID, each supplement provides 350 kcal and 20 grams of protein. Patient prefers chocolate.  NUTRITION DIAGNOSIS:   Inadequate oral intake related to decreased appetite as evidenced by per patient/family report.  GOAL:   Patient will meet greater than or equal to 90% of their needs  MONITOR:   PO intake, Supplement acceptance, Labs, Weight trends, I & O's  REASON FOR ASSESSMENT:   Malnutrition Screening Tool    ASSESSMENT:   69 year old male with PMHx of GERD, HLD, HTN, diverticulitis, mitral valve prolapse s/p mitral valve replacement on 04/12/2020 at [facility] who presented with right arm numbness and difficulty speaking found to have multiple small acute infarcts in bilateral cerebral and cerebellar hemispheres.   Met with patient at bedside. He reports he has had a decreased appetite since his mitral valve replacement 4 weeks ago. He eats approximately one meal per day at home. He reports what he has at that meal may vary. He used to have a good appetite and intake at baseline prior to surgery. He reports he is eating fairly well here. He ate 100% of his breakfast (fruit salad, oatmeal) and was able to eat a sandwich for lunch (the salad he ordered did not get delivered). Patient reports he has been drinking Ensure at home since his appetite has been decreased. He is amenable to drinking Ensure here as well and prefers chocolate flavor.  Patient reports he lost approximately 15 lbs over the past month. According to chart he was 87.7 kg on 04/16/2020. He is now documented to be 79.4 kg (175 lbs), though this could be a stated weight and not truly measured. He has lost 8.3 kg (9.5% body weight) over the past month, which is significant for time frame.   Medications reviewed and include: MVI daily, Protonix.  Labs reviewed.  Patient is at risk for malnutrition.  NUTRITION -  FOCUSED PHYSICAL EXAM:    Most Recent Value  Orbital Region  No depletion  Upper Arm Region  Mild depletion  Thoracic and Lumbar Region  No depletion  Buccal Region  No depletion  Temple Region  Mild depletion  Clavicle Bone Region  No depletion  Clavicle and Acromion Bone Region  No depletion  Scapular Bone Region  No depletion  Dorsal Hand  No depletion  Patellar Region  No depletion  Anterior Thigh Region  No depletion  Posterior Calf Region  No depletion  Edema (RD Assessment)  None  Hair  Reviewed  Eyes  Reviewed  Mouth  Reviewed  Skin  Reviewed  Nails  Reviewed     Diet Order:   Diet Order            Diet Heart Room service appropriate? Yes; Fluid consistency: Thin  Diet effective now             EDUCATION NEEDS:   No education needs have been identified at this time  Skin:  Skin Assessment: Reviewed RN Assessment  Last BM:  05/07/2020 per chart  Height:   Ht Readings from Last 1 Encounters:  05/07/20 '5\' 6"'$  (1.676 m)   Weight:   Wt Readings from Last 1 Encounters:  05/07/20 79.4 kg   BMI:  Body mass index is 28.25 kg/m.  Estimated Nutritional Needs:   Kcal:  1800-2000  Protein:  95-105 grams  Fluid:  1.8-2 L/day  Jacklynn Barnacle, MS, RD, LDN Pager number available on  Amion

## 2020-05-08 NOTE — Progress Notes (Signed)
1        [facility] [place] [place] NAME: Terry [name]    MR#:  [id number]  DATE OF BIRTH:  1951/03/22  SUBJECTIVE:  CHIEF COMPLAINT:   Chief Complaint  Patient presents with  . Code Stroke   back to baseline.  Denies any new complaints REVIEW OF SYSTEMS:  Review of Systems  Constitutional: Negative for diaphoresis, fever, malaise/fatigue and weight loss.  HENT: Negative for ear discharge, ear pain, hearing loss, nosebleeds, sore throat and tinnitus.   Eyes: Negative for blurred vision and pain.  Respiratory: Negative for cough, hemoptysis, shortness of breath and wheezing.   Cardiovascular: Negative for chest pain, palpitations, orthopnea and leg swelling.  Gastrointestinal: Negative for abdominal pain, blood in stool, constipation, diarrhea, heartburn, nausea and vomiting.  Genitourinary: Negative for dysuria, frequency and urgency.  Musculoskeletal: Negative for back pain and myalgias.  Skin: Negative for itching and rash.  Neurological: Negative for dizziness, tingling, tremors, focal weakness, seizures, weakness and headaches.  Psychiatric/Behavioral: Negative for depression. The patient is not nervous/anxious.    DRUG ALLERGIES:   Allergies  Allergen Reactions  . Penicillins Shortness Of Breath    Did it involve swelling of the face/tongue/throat, SOB, or low BP? Yes Did it involve sudden or severe rash/hives, skin peeling, or any reaction on the inside of your mouth or nose? Unknown Did you need to seek medical attention at a hospital or doctor's office? Was at a clinic when reaction occurred When did it last happen?More than 50 years ago If all above answers are "NO", may proceed with cephalosporin use.   . Ciprofloxacin Swelling and Rash  . Ivp Dye [Iodinated Diagnostic Agents] Rash   VITALS:  Blood pressure 131/85, pulse 83, temperature (!) 97.5 F (36.4 C), temperature source Oral, resp. rate 19, height 5\' 6"  (1.676 m), weight 79.4 kg, SpO2 99  %. PHYSICAL EXAMINATION:  Physical Exam HENT:     Head: Normocephalic and atraumatic.  Eyes:     Conjunctiva/sclera: Conjunctivae normal.     Pupils: Pupils are equal, round, and reactive to light.  Neck:     Thyroid: No thyromegaly.     Trachea: No tracheal deviation.  Cardiovascular:     Rate and Rhythm: Normal rate and regular rhythm.     Heart sounds: Normal heart sounds.  Pulmonary:     Effort: Pulmonary effort is normal. No respiratory distress.     Breath sounds: Normal breath sounds. No wheezing.  Chest:     Chest wall: No tenderness.  Abdominal:     General: Bowel sounds are normal. There is no distension.     Palpations: Abdomen is soft.     Tenderness: There is no abdominal tenderness.  Musculoskeletal:        General: Normal range of motion.     Cervical back: Normal range of motion and neck supple.  Skin:    General: Skin is warm and dry.     Findings: No rash.  Neurological:     Mental Status: He is alert and oriented to person, place, and time.     Cranial Nerves: No cranial nerve deficit.    LABORATORY PANEL:  Male CBC Recent Labs  Lab 05/07/20 1437  WBC 4.7  HGB 13.7  HCT 39.2  PLT 202   ------------------------------------------------------------------------------------------------------------------ Chemistries  Recent Labs  Lab 05/07/20 1437  NA 139  K 3.9  CL 105  CO2 25  GLUCOSE 134*  BUN 13  CREATININE 1.37*  CALCIUM 9.6  AST 21  ALT 22  ALKPHOS 74  BILITOT 0.7   RADIOLOGY:  MR BRAIN WO CONTRAST  Result Date: 05/07/2020 CLINICAL DATA:  Stroke. History of mitral valve replacement 04/12/2020 EXAM: MRI HEAD WITHOUT CONTRAST TECHNIQUE: Multiplanar, multiecho pulse sequences of the brain and surrounding structures were obtained without intravenous contrast. COMPARISON:  CT head 05/07/2020 FINDINGS: Brain: Multiple areas of restricted diffusion compatible with acute infarct. Small acute infarcts in the cerebellum bilaterally. Small  acute cortical infarcts in the right parietal lobe and in the right parietal white matter. Acute infarct in the left operculum and insula. Negative for hemorrhage or mass. Ventricle size and cerebral volume normal for age. Vascular: Normal arterial flow voids Skull and upper cervical spine: No focal skeletal lesion. Sinuses/Orbits: Mild mucosal edema paranasal sinuses. Right cataract extraction. No orbital lesion Other: None IMPRESSION: Multiple areas of acute infarct including both cerebral hemispheres and in the cerebellum bilaterally. Findings compatible with emboli. No hemorrhage or mass. Electronically Signed   By: Marlan Palau M.D.   On: 05/07/2020 18:52   US Carotid Bilateral (at [facility] and [facility] only)  Result Date: 05/08/2020 CLINICAL DATA:  69 year old male with a history of stroke EXAM: BILATERAL CAROTID DUPLEX ULTRASOUND TECHNIQUE: Wallace Cullens scale imaging, color Doppler and duplex ultrasound were performed of bilateral carotid and vertebral arteries in the neck. COMPARISON:  None. FINDINGS: Criteria: Quantification of carotid stenosis is based on velocity parameters that correlate the residual internal carotid diameter with NASCET-based stenosis levels, using the diameter of the distal internal carotid lumen as the denominator for stenosis measurement. The following velocity measurements were obtained: RIGHT ICA:  Systolic 82 cm/sec, Diastolic 32 cm/sec CCA:  78 cm/sec SYSTOLIC ICA/CCA RATIO:  1.1 ECA:  76 cm/sec LEFT ICA:  Systolic 57 cm/sec, Diastolic 24 cm/sec CCA:  95 cm/sec SYSTOLIC ICA/CCA RATIO:  0.6 ECA:  82 cm/sec Right Brachial SBP: Not acquired Left Brachial SBP: Not acquired RIGHT CAROTID ARTERY: No significant calcified disease of the right common carotid artery. Intermediate waveform maintained. Heterogeneous plaque without significant calcifications at the right carotid bifurcation. Low resistance waveform of the right ICA. No significant tortuosity. RIGHT VERTEBRAL ARTERY: Antegrade flow with  low resistance waveform. LEFT CAROTID ARTERY: No significant calcified disease of the left common carotid artery. Intermediate waveform maintained. Heterogeneous plaque at the left carotid bifurcation without significant calcifications. Low resistance waveform of the left ICA. LEFT VERTEBRAL ARTERY:  Antegrade flow with low resistance waveform. IMPRESSION: Color duplex indicates moderate heterogeneous plaque with no hemodynamically significant stenosis by duplex criteria in the extracranial cerebrovascular circulation. Signed, Yvone Neu. Reyne Dumas, RPVI [facility] Radiology Electronically Signed   By: Gilmer Mor D.O.   On: 05/08/2020 10:48   ECHOCARDIOGRAM COMPLETE  Result Date: 05/08/2020    ECHOCARDIOGRAM REPORT   Patient Name:   ALBI RAPPAPORT Date of Exam: 05/08/2020 Medical Rec #:  [id number]    Height:       66.0 in Accession #:    [id number]   Weight:       175.0 lb Date of Birth:  01/29/1951   BSA:          1.889 m Patient Age:    68 years     BP:           131/85 mmHg Patient Gender: M            HR:           84 bpm. Exam Location:  [facility] Procedure: 2D Echo, Color Doppler and Cardiac Doppler Indications:     I163.9 Stroke  History:         Patient has no prior history of Echocardiogram examinations.                  MVR April 2020; Risk Factors:Hypertension and HCL.  Sonographer:     Humphrey Rolls RDCS (AE) Referring Phys:  Kern Reap Brien Few NIU Diagnosing Phys: Harold Hedge MD  Sonographer Comments: Suboptimal parasternal window and no subcostal window. IMPRESSIONS  1. Left ventricular ejection fraction, by estimation, is 55 to 60%. The left ventricle has normal function. The left ventricle has no regional wall motion abnormalities. Left ventricular diastolic parameters are consistent with Grade I diastolic dysfunction (impaired relaxation).  2. Right ventricular systolic function is normal. The right ventricular size is normal.  3. Left atrial size was moderately  dilated.  4. The mitral valve has been repaired/replaced. Trivial mitral valve regurgitation.  5. The aortic valve is grossly normal. Aortic valve regurgitation is not visualized. FINDINGS  Left Ventricle: Left ventricular ejection fraction, by estimation, is 55 to 60%. The left ventricle has normal function. The left ventricle has no regional wall motion abnormalities. The left ventricular internal cavity size was normal in size. There is  no left ventricular hypertrophy. Left ventricular diastolic parameters are consistent with Grade I diastolic dysfunction (impaired relaxation). Right Ventricle: The right ventricular size is normal. No increase in right ventricular wall thickness. Right ventricular systolic function is normal. Left Atrium: Left atrial size was moderately dilated. Right Atrium: Right atrial size was normal in size. Pericardium: There is no evidence of pericardial effusion. Mitral Valve: The mitral valve has been repaired/replaced. Trivial mitral valve regurgitation. There is a bioprosthetic valve present in the mitral position. MV peak gradient, 11.7 mmHg. The mean mitral valve gradient is 5.0 mmHg. Tricuspid Valve: The tricuspid valve is not well visualized. Tricuspid valve regurgitation is trivial. Aortic Valve: The aortic valve is grossly normal. Aortic valve regurgitation is not visualized. Aortic valve mean gradient measures 9.0 mmHg. Aortic valve peak gradient measures 17.5 mmHg. Aortic valve area, by VTI measures 1.34 cm. Pulmonic Valve: The pulmonic valve was not well visualized. Pulmonic valve regurgitation is not visualized. Aorta: The aortic root was not well visualized. IAS/Shunts: The interatrial septum was not assessed.  LEFT VENTRICLE PLAX 2D LVOT diam:     1.50 cm  Diastology LV SV:         38       LV e' lateral:   5.00 cm/s LV SV Index:   20       LV E/e' lateral: 20.4 LVOT Area:     1.77 cm LV e' medial:    5.33 cm/s                         LV E/e' medial:  19.1  LEFT ATRIUM              Index LA Vol (A2C):   41.9 ml 22.18 ml/m LA Vol (A4C):   96.1 ml 50.86 ml/m LA Biplane Vol: 65.4 ml 34.61 ml/m  AORTIC VALVE AV Area (Vmax):    1.16 cm AV Area (Vmean):   1.15 cm AV Area (VTI):     1.34 cm AV Vmax:           209.00 cm/s AV Vmean:          141.000 cm/s AV VTI:  0.283 m AV Peak Grad:      17.5 mmHg AV Mean Grad:      9.0 mmHg LVOT Vmax:         137.00 cm/s LVOT Vmean:        91.800 cm/s LVOT VTI:          0.214 m LVOT/AV VTI ratio: 0.76  AORTA Ao Root diam: 1.90 cm MITRAL VALVE MV Area (PHT): 3.19 cm     SHUNTS MV Peak grad:  11.7 mmHg    Systemic VTI:  0.21 m MV Mean grad:  5.0 mmHg     Systemic Diam: 1.50 cm MV Vmax:       1.71 m/s MV Vmean:      101.0 cm/s MV Decel Time: 238 msec MV E velocity: 102.00 cm/s MV A velocity: 181.00 cm/s MV E/A ratio:  0.56 Harold Hedge MD Electronically signed by Harold Hedge MD Signature Date/Time: 05/08/2020/4:08:42 PM    Final    ASSESSMENT AND PLAN:  69 y.o.malewith a history of HLD, HTN and mitral valve prolapse s/p mitral valve replacement on 04/12/20 is admitted for acute stroke  Acute embolic stroke: CT-head negative.  -Appreciate Dr. Thad Ranger of neurology input. - MRI of the brain shows multiple small acute infarcts in the bilateral cerebral and cerebellar hemispheres.   - Carotid dopplers show no evidence of hemodynamically significant stenosis.  - Echocardiogram within normal limits.  No clots seen. A1c 5.4, LDL 172. -Discontinue aspirin and Plavix and start Eliquis. -This is likely due to paroxysmal A. fib  Paroxysmal A. fib Cardiology recommends continuing amiodarone 200 mg daily and start Eliquis for anticoagulation  GERD (gastroesophageal reflux disease) -protonix  Hypercholesteremia -lipitor  Hypertension -hold lasix -IV prn hydralazine for SBP >220, or DBP>120  S/P mitral valve replacement: Recently performed replacement 04/12/2020 at the [facility] -on amiodarone per Dr. Lady Gary of card -Cardiology is  following  CKD (chronic kidney disease), stage IIIa: Stable -Monitor BMP    Status is: Inpatient  Remains inpatient appropriate because:Ongoing diagnostic testing needed not appropriate for outpatient work up   Dispo: The patient is from: Home              Anticipated d/c is to: Home              Anticipated d/c date is: 1 day              Patient currently is not medically stable to d/c.    DVT prophylaxis: Eliquis Family Communication: Wife and daughter at bedside   All the records are reviewed and case discussed with Care Management/Social Worker. Management plans discussed with the patient, family and they are in agreement.  CODE STATUS: Full Code  TOTAL TIME TAKING CARE OF THIS PATIENT: 45 minutes.   More than 50% of the time was spent in counseling/coordination of care: YES  POSSIBLE D/C IN 1 DAYS, DEPENDING ON CLINICAL CONDITION.   Delfino Lovett M.D on 05/08/2020 at 5:01 PM  [facility]   CC: Primary care physician; Gracelyn Nurse, MD  Note: This dictation was prepared with Dragon dictation along with smaller phrase technology. Any transcriptional errors that result from this process are unintentional.

## 2020-05-08 NOTE — Evaluation (Signed)
Speech Language Pathology Evaluation Patient Details Name: Terry [name] MRN: [id number] DOB: 1951/02/01 Today's Date: 05/08/2020 Time: 5093-2671 SLP Time Calculation (min) (ACUTE ONLY): 25 min  Problem List:  Patient Active Problem List   Diagnosis Date Noted  . Hypercholesteremia   . Hypertension   . Stroke ([place])   . S/P mitral valve replacement   . CKD (chronic kidney disease), stage III   . Leg pain 09/27/2017  . 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 Venous insufficiency 09/27/2017  . GERD (gastroesophageal reflux disease) 09/27/2017   Past Medical History:  Past Medical History:  Diagnosis Date  . Diverticulosis   . GERD (gastroesophageal reflux disease)   . Heart murmur   . Hypercholesteremia   . Hypertension   . Mitral valve prolapse    Past Surgical History:  Past Surgical History:  Procedure Laterality Date  . COLONOSCOPY WITH PROPOFOL N/A 03/06/2020   Procedure: COLONOSCOPY WITH PROPOFOL;  Surgeon: Toledo, Benay Pike, MD;  Location: [facility] ENDOSCOPY;  Service: Gastroenterology;  Laterality: N/A;  . MITRAL VALVE REPLACEMENT    . RETINAL DETACHMENT SURGERY    . RIGHT/LEFT HEART CATH AND CORONARY ANGIOGRAPHY Bilateral 02/18/2017   Procedure: Right/Left Heart Cath and Coronary Angiography;  Surgeon: Isaias Cowman, MD;  Location: [place] CV LAB;  Service: Cardiovascular;  Laterality: Bilateral;  . RIGHT/LEFT HEART CATH AND CORONARY ANGIOGRAPHY N/A 02/22/2020   Procedure: RIGHT/LEFT HEART CATH AND CORONARY ANGIOGRAPHY;  Surgeon: Isaias Cowman, MD;  Location: [place] CV LAB;  Service: Cardiovascular;  Laterality: N/A;  . TEMPOROMANDIBULAR JOINT SURGERY     HPI:  Terry [name] is a 69 y.o. 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 MRI revealed multiple  areas of acute infarct including both cerebral hemispheres and in the cerebellum bilaterally, no hemorrhage or mass effect. Findings compatible with emboli.   Assessment / Plan / Recommendation Clinical Impression  Pt presents with mild expressive aphasia and mild dysarthria of speech. Pt's expressive aphasia is c/b higher level word finding difficulty that results in increased pt effort to communicate effectively. Pt's also demonstrates a mild dysarthria that reduces his speech intelligibility to ~ 80 - 90% at the conversation level. Recommend referral for Outpatient ST services to target the above mentioned deficits to increase functional independence and reduce caregiver burden.     SLP Assessment  SLP Recommendation/Assessment: All further Speech Lanaguage Pathology  needs can be addressed in the next venue of care SLP Visit Diagnosis: Dysarthria and anarthria (R47.1);Aphasia (R47.01)    Follow Up Recommendations  Outpatient SLP    Frequency and Duration           SLP Evaluation Cognition  Overall Cognitive Status: Within Functional Limits for tasks assessed Arousal/Alertness: Awake/alert Orientation Level: Oriented X4       Comprehension  Visual Recognition/Discrimination Discrimination: Not tested Reading Comprehension Reading Status: Not tested    Expression Expression Primary Mode of Expression: Verbal Verbal Expression Overall Verbal Expression: Impaired Initiation: No impairment Automatic Speech: Name;Social Response Level of Generative/Spontaneous Verbalization: Sentence Repetition: No impairment Naming: No impairment(difficulty with higher level word finding) Pragmatics: No impairment Non-Verbal Means of Communication: Not applicable Written Expression Dominant Hand: Right Written Expression: Not tested   Oral / Motor  Oral Motor/Sensory Function Overall Oral Motor/Sensory Function: Within functional limits Motor Speech Overall Motor Speech: Appears within  functional limits for  tasks assessed Respiration: Within functional limits Phonation: Normal Resonance: Within functional limits Articulation: Impaired Level of Impairment: Conversation Intelligibility: Intelligibility reduced Word: 75-100% accurate Phrase: 75-100% accurate Sentence: 75-100% accurate Conversation: 75-100% accurate(90% at conversation level) Motor Planning: Witnin functional limits Motor Speech Errors: Not applicable   GO                   Tania Perrott B. Dreama Saa M.S., CCC-SLP, [facility] Speech-Language Pathologist Rehabilitation Services Office [phone]  Reuel Derby 05/08/2020, 9:53 AM

## 2020-05-09 MED ORDER — AMIODARONE HCL 200 MG PO TABS: 200.0000 mg | ORAL_TABLET | Freq: Every day | ORAL | Status: DC

## 2020-05-09 MED ORDER — ATORVASTATIN CALCIUM 40 MG PO TABS: 40.0000 mg | ORAL_TABLET | Freq: Every day | ORAL | 0 refills | Status: DC

## 2020-05-09 MED ORDER — APIXABAN 5 MG PO TABS: 5.0000 mg | ORAL_TABLET | Freq: Two times a day (BID) | ORAL | 0 refills | Status: DC

## 2020-05-09 NOTE — Progress Notes (Signed)
PT Cancellation Note  Patient Details Name: Terry [name] MRN: [id number] DOB: [birth year]   Cancelled Treatment:    Reason Eval/Treat Not Completed: Other (comment)   Offered therapy services.  He stated he has walked with staff this am and has no concerns regarding mobility.  Declined at this time as he is awaiting discharge home.    Danielle Dess 05/09/2020, 9:33 AM

## 2020-05-09 NOTE — Progress Notes (Signed)
Pt ambulated around nurses station x5 with stand assist tolerated well.

## 2020-05-09 NOTE — Discharge Instructions (Signed)
 Hospital Discharge After a Stroke  Being discharged from the hospital after a stroke can feel overwhelming. Many things may be different, and it is normal to feel scared or anxious. Some stroke survivors may be able to return to their homes, and others may need more specialized care on a temporary or permanent basis. Your stroke care team will work with you to develop a discharge plan that is best for you. Ask questions if you do not understand something. Invite a friend or family member to participate in discharge planning. Understanding and following your discharge plan can help to prevent another stroke or other problems. Understanding your medicines After a stroke, your health care provider may prescribe one or more types of medicine. It is important to take medicines exactly as told by your health care provider. Serious harm, such as another stroke, can happen if you are unable to take your medicine exactly as prescribed. Make sure you understand:  What medicine to take.  Why you are taking the medicine.  How and when to take it.  If it can be taken with your other medicines and herbal supplements.  Possible side effects.  When to call your health care provider if you have any side effects.  How you will get and pay for your medicines. Medical assistance programs may be able to help you pay for prescription medicines if you cannot afford them. If you are taking an anticoagulant, be sure to take it exactly as told by your health care provider. This type of medicine can increase the risk of bleeding because it works to prevent blood from clotting. You may need to take certain precautions to prevent bleeding. You should contact your health care provider if you have:  Bleeding or bruising.  A fall or other injury to your head.  Blood in your urine or stool (feces). Planning for home safety  Take steps to prevent falls, such as installing grab bars or using a shower chair. Ask a  friend or family member to get needed things in place before you go home if possible. A therapist can come to your home to make recommendations for safety equipment. Ask your health care provider if you would benefit from this service or from home care. Getting needed equipment Ask your health care provider for a list of any medical equipment and supplies you will need at home. These may include items such as:  Walkers.  Canes.  Wheelchairs.  Hand-strengthening devices.  Special eating utensils. Medical equipment can be rented or purchased, depending on your insurance coverage. Check with your insurance company about what is covered. Keeping follow-up visits After a stroke, you will need to follow up regularly with a health care provider. You may also need rehabilitation, which can include physical therapy, occupational therapy, or speech-language therapy. Keeping these appointments is very important to your recovery after a stroke. Be sure to bring your medicine list and discharge papers with you to your appointments. If you need help to keep track of your schedule, use a calendar or appointment reminder. Preventing another stroke Having a stroke puts you at risk for another stroke in the future. Ask your health care provider what actions you can take to lower the risk. These may include:  Increasing how much you exercise.  Making a healthy eating plan.  Quitting smoking.  Managing other health conditions, such as high blood pressure, high cholesterol, or diabetes.  Limiting alcohol use. Knowing the warning signs of a stroke  Make   sure you understand the signs of a stroke. Before you leave the hospital, you will receive information outlining the stroke warning signs. Share these with your friends and family members. "BE FAST" is an easy way to remember the main warning signs of a stroke:  B - Balance. Signs are dizziness, sudden trouble walking, or loss of balance.  E - Eyes.  Signs are trouble seeing or a sudden change in vision.  F - Face. Signs are sudden weakness or numbness of the face, or the face or eyelid drooping on one side.  A - Arms. Signs are weakness or numbness in an arm. This happens suddenly and usually on one side of the body.  S - Speech. Signs are sudden trouble speaking, slurred speech, or trouble understanding what people say.  T - Time. Time to call emergency services. Write down what time symptoms started. Other signs of stroke may include:  A sudden, severe headache with no known cause.  Nausea or vomiting.  Seizure. These symptoms may represent a serious problem that is an emergency. Do not wait to see if the symptoms will go away. Get medical help right away. Call your local emergency services (911 in the [place] Do not drive yourself to the hospital. Make note of the time that you had your first symptoms. Your emergency responders or emergency room staff will need to know this information. Summary  Being discharged from the hospital after a stroke can feel overwhelming. It is normal to feel scared or anxious.  Make sure you take medicines exactly as told by your health care provider.  Know the warning signs of a stroke, and get help right way if you have any of these symptoms. "BE FAST" is an easy way to remember the main warning signs of a stroke. This information is not intended to replace advice given to you by your health care provider. Make sure you discuss any questions you have with your health care provider. Document Revised: 09/06/2019 Document Reviewed: 03/19/2017 Elsevier Patient Education  2020 [employer]  

## 2020-05-09 NOTE — Progress Notes (Signed)
Subjective: No new neurological complaints  Objective: Current vital signs: BP 119/82 (BP Location: Right Arm)   Pulse 87   Temp 98 F (36.7 C) (Oral)   Resp 16   Ht 5\' 6"  (1.676 m)   Wt 79.4 kg   SpO2 99%   BMI 28.25 kg/m  Vital signs in last 24 hours: Temp:  [98 F (36.7 C)-98.7 F (37.1 C)] 98 F (36.7 C) (05/13 0816) Pulse Rate:  [87-93] 87 (05/13 0816) Resp:  [16-20] 16 (05/13 0816) BP: (119-136)/(82-91) 119/82 (05/13 0816) SpO2:  [99 %-100 %] 99 % (05/13 0816)  Intake/Output from previous day: 05/12 0701 - 05/13 0700 In: 480 [P.O.:480] Out: -  Intake/Output this shift: No intake/output data recorded. Nutritional status:  Diet Order            Diet - low sodium heart healthy        Diet Heart Room service appropriate? Yes; Fluid consistency: Thin  Diet effective now              Neurologic Exam: Mental Status: Alert, oriented, thought content appropriate.  Some mild word finding difficulty noted.  Able to follow 3 step commands without difficulty. Cranial Nerves: II: isual fields grossly normal, pupils equal, round, reactive to light and accommodation III,IV, VI: ptosis not present, extra-ocular motions intact bilaterally V,VII: mild decrease in the right NLF, facial light touch sensation normal bilaterally VIII: hearing normal bilaterally IX,X: gag reflex present XI: bilateral shoulder shrug XII: midline tongue extension Motor: Right : Upper extremity   5/5    Left:     Upper extremity   5/5  Lower extremity   5/5     Lower extremity   5/5 Tone and bulk:normal tone throughout; no atrophy noted Sensory: Pinprick and light touch intact throughout, bilaterally   Lab Results: Basic Metabolic Panel: Recent Labs  Lab 05/07/20 1437  NA 139  K 3.9  CL 105  CO2 25  GLUCOSE 134*  BUN 13  CREATININE 1.37*  CALCIUM 9.6    Liver Function Tests: Recent Labs  Lab 05/07/20 1437  AST 21  ALT 22  ALKPHOS 74  BILITOT 0.7  PROT 7.5  ALBUMIN 3.9    No results for input(s): LIPASE, AMYLASE in the last 168 hours. No results for input(s): AMMONIA in the last 168 hours.  CBC: Recent Labs  Lab 05/07/20 1437  WBC 4.7  NEUTROABS 2.5  HGB 13.7  HCT 39.2  MCV 95.4  PLT 202    Cardiac Enzymes: No results for input(s): CKTOTAL, CKMB, CKMBINDEX, TROPONINI in the last 168 hours.  Lipid Panel: Recent Labs  Lab 05/08/20 0324  CHOL 230*  TRIG 142  HDL 30*  CHOLHDL 7.7  VLDL 28  LDLCALC 07/08/20*    CBG: Recent Labs  Lab 05/07/20 1438  GLUCAP 133*    Microbiology: Results for orders placed or performed during the hospital encounter of 05/07/20  SARS Coronavirus 2 by RT PCR (hospital order, performed in [facility] hospital lab) Nasopharyngeal Nasopharyngeal Swab     Status: None   Collection Time: 05/07/20  4:09 PM   Specimen: Nasopharyngeal Swab  Result Value Ref Range Status   SARS Coronavirus 2 NEGATIVE NEGATIVE Final    Comment: (NOTE) SARS-CoV-2 target nucleic acids are NOT DETECTED. The SARS-CoV-2 RNA is generally detectable in upper and lower respiratory specimens during the acute phase of infection. The lowest concentration of SARS-CoV-2 viral copies this assay can detect is 250 copies / mL. A negative  result does not preclude SARS-CoV-2 infection and should not be used as the sole basis for treatment or other patient management decisions.  A negative result may occur with improper specimen collection / handling, submission of specimen other than nasopharyngeal swab, presence of viral mutation(s) within the areas targeted by this assay, and inadequate number of viral copies (<250 copies / mL). A negative result must be combined with clinical observations, patient history, and epidemiological information. Fact Sheet for Patients:   StrictlyIdeas.no Fact Sheet for Healthcare Providers: BankingDealers.co.za This test is not yet approved or cleared  by the [place] FDA and has been authorized for detection and/or diagnosis of SARS-CoV-2 by FDA under an Emergency Use Authorization (EUA).  This EUA will remain in effect (meaning this test can be used) for the duration of the COVID-19 declaration under Section 564(b)(1) of the Act, 21 U.S.C. section 360bbb-3(b)(1), unless the authorization is terminated or revoked sooner. Performed at [facility], [address] [place], [place] [address]     Coagulation Studies: Recent Labs    05/07/20 1437  LABPROT 13.4  INR 1.1    Imaging: MR BRAIN WO CONTRAST  Result Date: 05/07/2020 CLINICAL DATA:  Stroke. History of mitral valve replacement 04/12/2020 EXAM: MRI HEAD WITHOUT CONTRAST TECHNIQUE: Multiplanar, multiecho pulse sequences of the brain and surrounding structures were obtained without intravenous contrast. COMPARISON:  CT head 05/07/2020 FINDINGS: Brain: Multiple areas of restricted diffusion compatible with acute infarct. Small acute infarcts in the cerebellum bilaterally. Small acute cortical infarcts in the right parietal lobe and in the right parietal white matter. Acute infarct in the left operculum and insula. Negative for hemorrhage or mass. Ventricle size and cerebral volume normal for age. Vascular: Normal arterial flow voids Skull and upper cervical spine: No focal skeletal lesion. Sinuses/Orbits: Mild mucosal edema paranasal sinuses. Right cataract extraction. No orbital lesion Other: None IMPRESSION: Multiple areas of acute infarct including both cerebral hemispheres and in the cerebellum bilaterally. Findings compatible with emboli. No hemorrhage or mass. Electronically Signed   By: Franchot Gallo M.D.   On: 05/07/2020 18:52   US Carotid Bilateral (at [facility] and [facility] only)  Result Date: 05/08/2020 CLINICAL DATA:  69 year old male with a history of stroke EXAM: BILATERAL CAROTID DUPLEX ULTRASOUND TECHNIQUE: Pearline Cables scale imaging, color Doppler and duplex ultrasound were performed of  bilateral carotid and vertebral arteries in the neck. COMPARISON:  None. FINDINGS: Criteria: Quantification of carotid stenosis is based on velocity parameters that correlate the residual internal carotid diameter with NASCET-based stenosis levels, using the diameter of the distal internal carotid lumen as the denominator for stenosis measurement. The following velocity measurements were obtained: RIGHT ICA:  Systolic 82 cm/sec, Diastolic 32 cm/sec CCA:  78 cm/sec SYSTOLIC ICA/CCA RATIO:  1.1 ECA:  76 cm/sec LEFT ICA:  Systolic 57 cm/sec, Diastolic 24 cm/sec CCA:  95 cm/sec SYSTOLIC ICA/CCA RATIO:  0.6 ECA:  82 cm/sec Right Brachial SBP: Not acquired Left Brachial SBP: Not acquired RIGHT CAROTID ARTERY: No significant calcified disease of the right common carotid artery. Intermediate waveform maintained. Heterogeneous plaque without significant calcifications at the right carotid bifurcation. Low resistance waveform of the right ICA. No significant tortuosity. RIGHT VERTEBRAL ARTERY: Antegrade flow with low resistance waveform. LEFT CAROTID ARTERY: No significant calcified disease of the left common carotid artery. Intermediate waveform maintained. Heterogeneous plaque at the left carotid bifurcation without significant calcifications. Low resistance waveform of the left ICA. LEFT VERTEBRAL ARTERY:  Antegrade flow with low resistance waveform. IMPRESSION: Color duplex  indicates moderate heterogeneous plaque with no hemodynamically significant stenosis by duplex criteria in the extracranial cerebrovascular circulation. Signed, Yvone Neu. Reyne Dumas, RPVI [facility] Electronically Signed   By: Gilmer Mor D.O.   On: 05/08/2020 10:48   DG Chest Portable 1 View  Result Date: 05/07/2020 CLINICAL DATA:  Right arm tingling EXAM: PORTABLE CHEST 1 VIEW COMPARISON:  None. FINDINGS: Linear atelectasis or scarring in the right mid lung. Cardiomegaly. Left lung  clear. No effusions. No acute bony abnormality. IMPRESSION: Right mid lung atelectasis or scarring. Cardiomegaly. Electronically Signed   By: Charlett Nose M.D.   On: 05/07/2020 15:37   ECHOCARDIOGRAM COMPLETE  Result Date: 05/08/2020    ECHOCARDIOGRAM REPORT   Patient Name:   Terry [name] Date of Exam: 05/08/2020 Medical Rec #:  [id number]    Height:       66.0 in Accession #:    [id number]   Weight:       175.0 lb Date of Birth:  Jan 14, 1951   BSA:          1.889 m Patient Age:    68 years     BP:           131/85 mmHg Patient Gender: M            HR:           84 bpm. Exam Location:  [facility] Procedure: 2D Echo, Color Doppler and Cardiac Doppler Indications:     I163.9 Stroke  History:         Patient has no prior history of Echocardiogram examinations.                  MVR April 2020; Risk Factors:Hypertension and HCL.  Sonographer:     Humphrey Rolls RDCS (AE) Referring Phys:  Kern Reap Brien Few NIU Diagnosing Phys: Harold Hedge MD  Sonographer Comments: Suboptimal parasternal window and no subcostal window. IMPRESSIONS  1. Left ventricular ejection fraction, by estimation, is 55 to 60%. The left ventricle has normal function. The left ventricle has no regional wall motion abnormalities. Left ventricular diastolic parameters are consistent with Grade I diastolic dysfunction (impaired relaxation).  2. Right ventricular systolic function is normal. The right ventricular size is normal.  3. Left atrial size was moderately dilated.  4. The mitral valve has been repaired/replaced. Trivial mitral valve regurgitation.  5. The aortic valve is grossly normal. Aortic valve regurgitation is not visualized. FINDINGS  Left Ventricle: Left ventricular ejection fraction, by estimation, is 55 to 60%. The left ventricle has normal function. The left ventricle has no regional wall motion abnormalities. The left ventricular internal cavity size was normal in size. There is  no left ventricular hypertrophy. Left ventricular diastolic parameters  are consistent with Grade I diastolic dysfunction (impaired relaxation). Right Ventricle: The right ventricular size is normal. No increase in right ventricular wall thickness. Right ventricular systolic function is normal. Left Atrium: Left atrial size was moderately dilated. Right Atrium: Right atrial size was normal in size. Pericardium: There is no evidence of pericardial effusion. Mitral Valve: The mitral valve has been repaired/replaced. Trivial mitral valve regurgitation. There is a bioprosthetic valve present in the mitral position. MV peak gradient, 11.7 mmHg. The mean mitral valve gradient is 5.0 mmHg. Tricuspid Valve: The tricuspid valve is not well visualized. Tricuspid valve regurgitation is trivial. Aortic Valve: The aortic valve is grossly normal. Aortic valve regurgitation is not visualized. Aortic valve mean gradient measures 9.0 mmHg. Aortic  valve peak gradient measures 17.5 mmHg. Aortic valve area, by VTI measures 1.34 cm. Pulmonic Valve: The pulmonic valve was not well visualized. Pulmonic valve regurgitation is not visualized. Aorta: The aortic root was not well visualized. IAS/Shunts: The interatrial septum was not assessed.  LEFT VENTRICLE PLAX 2D LVOT diam:     1.50 cm  Diastology LV SV:         38       LV e' lateral:   5.00 cm/s LV SV Index:   20       LV E/e' lateral: 20.4 LVOT Area:     1.77 cm LV e' medial:    5.33 cm/s                         LV E/e' medial:  19.1  LEFT ATRIUM             Index LA Vol (A2C):   41.9 ml 22.18 ml/m LA Vol (A4C):   96.1 ml 50.86 ml/m LA Biplane Vol: 65.4 ml 34.61 ml/m  AORTIC VALVE AV Area (Vmax):    1.16 cm AV Area (Vmean):   1.15 cm AV Area (VTI):     1.34 cm AV Vmax:           209.00 cm/s AV Vmean:          141.000 cm/s AV VTI:            0.283 m AV Peak Grad:      17.5 mmHg AV Mean Grad:      9.0 mmHg LVOT Vmax:         137.00 cm/s LVOT Vmean:        91.800 cm/s LVOT VTI:          0.214 m LVOT/AV VTI ratio: 0.76  AORTA Ao Root diam: 1.90 cm  MITRAL VALVE MV Area (PHT): 3.19 cm     SHUNTS MV Peak grad:  11.7 mmHg    Systemic VTI:  0.21 m MV Mean grad:  5.0 mmHg     Systemic Diam: 1.50 cm MV Vmax:       1.71 m/s MV Vmean:      101.0 cm/s MV Decel Time: 238 msec MV E velocity: 102.00 cm/s MV A velocity: 181.00 cm/s MV E/A ratio:  0.56 Harold HedgeKenneth Fath MD Electronically signed by Harold HedgeKenneth Fath MD Signature Date/Time: 05/08/2020/4:08:42 PM    Final    CT HEAD CODE STROKE WO CONTRAST  Addendum Date: 05/07/2020   ADDENDUM REPORT: 05/07/2020 15:09 ADDENDUM: Study discussed by telephone with Dr. Artis DelayMARY FUNKE on 05/07/2020 at 1459 hours. Electronically Signed   By: Odessa FlemingH  Hall M.D.   On: 05/07/2020 15:09   Result Date: 05/07/2020 CLINICAL DATA:  Code stroke. 69 year old male with right arm numbness beginning at 1345 hours today and slurred speech. EXAM: CT HEAD WITHOUT CONTRAST TECHNIQUE: Contiguous axial images were obtained from the base of the skull through the vertex without intravenous contrast. COMPARISON:  None. FINDINGS: Brain: No acute intracranial hemorrhage identified. No midline shift, mass effect, or evidence of intracranial mass lesion. No ventriculomegaly. No cortically based acute infarct identified. Bilateral gray-white matter differentiation is within normal limits for age. Vascular: Mild Calcified atherosclerosis at the skull base. No suspicious intracranial vascular hyperdensity. Skull: Negative. Sinuses/Orbits: Visualized paranasal sinuses and mastoids are clear. Other: No acute orbit or scalp soft tissue findings. ASPECTS [facility]([place] Stroke Program Early CT Score) Total score (0-10 with 10 being normal): 10 IMPRESSION: Normal  for age non contrast CT appearance of the brain. ASPECTS 10. Electronically Signed: By: Odessa Fleming M.D. On: 05/07/2020 14:52    Medications:  I have reviewed the patient's current medications. Scheduled: . amiodarone  200 mg Oral Daily  . apixaban  5 mg Oral BID  . atorvastatin  40 mg Oral Daily  . feeding supplement  (ENSURE ENLIVE)  237 mL Oral BID BM  . multivitamin with minerals  1 tablet Oral q morning - 10a  . pantoprazole  40 mg Oral QHS  . sodium chloride flush  3 mL Intravenous Once  . valACYclovir  500 mg Oral QHS    Assessment/Plan: 68 y.o.malewith a history of HLD, HTN and mitral valve prolapse s/p mitral valve replacement on 04/12/20 who reports waking up at baseline. Went to take shower and while in the shower noted that his right arm became numb. Once out of the shower was unable to communicate with his wife. Patient brought in for evaluation. Symptoms now resolved. MRI of the brain personally reviewed and shows multiple small acute infarcts in the bilateral cerebral and cerebellar hemispheres.  On conversation with cardiology there is a concern for atrial fibrillation.  Patient felt to be a candidate for anticoagulation.  Acute infarcts are small and would not be a contraindication.  Started on Eliquis. Carotid dopplers show no evidence of hemodynamically significant stenosis.  Echocardiogram shows no cardiac source of emboli with EF of 55-60%.  A1c 5.4, LDL 172.  Stroke Risk Factors -hyperlipidemia and hypertension  Plan: 1. Agree with statin and Eliquis 2. Follow up with cardiology and neurology on an outpatient basis   LOS: 1 day   Thana Farr, MD Neurology [phone] 05/09/2020  9:52 AM

## 2020-05-10 ENCOUNTER — Emergency Department: Payer: Medicare HMO

## 2020-05-10 ENCOUNTER — Encounter: Payer: Self-pay | Admitting: Emergency Medicine

## 2020-05-10 ENCOUNTER — Other Ambulatory Visit: Payer: Self-pay

## 2020-05-10 ENCOUNTER — Observation Stay
Admission: EM | Admit: 2020-05-10 | Discharge: 2020-05-11 | Disposition: A | Discharge status: Home / Self Care | Payer: Medicare HMO | Attending: Internal Medicine | Admitting: Internal Medicine

## 2020-05-10 DIAGNOSIS — E7800 Pure hypercholesterolemia, unspecified: Secondary | ICD-10-CM | POA: Diagnosis not present

## 2020-05-10 DIAGNOSIS — Z952 Presence of prosthetic heart valve: Secondary | ICD-10-CM

## 2020-05-10 DIAGNOSIS — I639 Cerebral infarction, unspecified: Secondary | ICD-10-CM

## 2020-05-10 DIAGNOSIS — Z953 Presence of xenogenic heart valve: Secondary | ICD-10-CM | POA: Insufficient documentation

## 2020-05-10 DIAGNOSIS — I872 Venous insufficiency (chronic) (peripheral): Secondary | ICD-10-CM | POA: Diagnosis not present

## 2020-05-10 DIAGNOSIS — Z7901 Long term (current) use of anticoagulants: Secondary | ICD-10-CM | POA: Diagnosis not present

## 2020-05-10 DIAGNOSIS — Z87891 Personal history of nicotine dependence: Secondary | ICD-10-CM | POA: Insufficient documentation

## 2020-05-10 DIAGNOSIS — R531 Weakness: Secondary | ICD-10-CM | POA: Diagnosis not present

## 2020-05-10 DIAGNOSIS — Z79899 Other long term (current) drug therapy: Secondary | ICD-10-CM | POA: Diagnosis not present

## 2020-05-10 DIAGNOSIS — I129 Hypertensive chronic kidney disease with stage 1 through stage 4 chronic kidney disease, or unspecified chronic kidney disease: Secondary | ICD-10-CM | POA: Diagnosis not present

## 2020-05-10 DIAGNOSIS — I10 Essential (primary) hypertension: Secondary | ICD-10-CM | POA: Diagnosis present

## 2020-05-10 DIAGNOSIS — K219 Gastro-esophageal reflux disease without esophagitis: Secondary | ICD-10-CM | POA: Diagnosis not present

## 2020-05-10 DIAGNOSIS — I4891 Unspecified atrial fibrillation: Secondary | ICD-10-CM | POA: Insufficient documentation

## 2020-05-10 DIAGNOSIS — N183 Chronic kidney disease, stage 3 unspecified: Secondary | ICD-10-CM | POA: Diagnosis present

## 2020-05-10 LAB — PROTIME-INR
INR: 1.3 — ABNORMAL HIGH (ref 0.8–1.2)
Prothrombin Time: 15.6 seconds — ABNORMAL HIGH (ref 11.4–15.2)

## 2020-05-10 LAB — DIFFERENTIAL
Abs Immature Granulocytes: 0.01 10*3/uL (ref 0.00–0.07)
Basophils Absolute: 0 10*3/uL (ref 0.0–0.1)
Basophils Relative: 1 %
Eosinophils Absolute: 0.4 10*3/uL (ref 0.0–0.5)
Eosinophils Relative: 9 %
Immature Granulocytes: 0 %
Lymphocytes Relative: 27 %
Lymphs Abs: 1.3 10*3/uL (ref 0.7–4.0)
Monocytes Absolute: 0.5 10*3/uL (ref 0.1–1.0)
Monocytes Relative: 10 %
Neutro Abs: 2.6 10*3/uL (ref 1.7–7.7)
Neutrophils Relative %: 53 %

## 2020-05-10 LAB — COMPREHENSIVE METABOLIC PANEL
ALT: 31 U/L (ref 0–44)
AST: 30 U/L (ref 15–41)
Albumin: 4.2 g/dL (ref 3.5–5.0)
Alkaline Phosphatase: 64 U/L (ref 38–126)
Anion gap: 8 (ref 5–15)
BUN: 17 mg/dL (ref 8–23)
CO2: 26 mmol/L (ref 22–32)
Calcium: 9.6 mg/dL (ref 8.9–10.3)
Chloride: 103 mmol/L (ref 98–111)
Creatinine, Ser: 1.4 mg/dL — ABNORMAL HIGH (ref 0.61–1.24)
GFR calc Af Amer: 59 mL/min — ABNORMAL LOW (ref >60)
GFR calc non Af Amer: 51 mL/min — ABNORMAL LOW (ref >60)
Glucose, Bld: 105 mg/dL — ABNORMAL HIGH (ref 70–99)
Potassium: 4.6 mmol/L (ref 3.5–5.1)
Sodium: 137 mmol/L (ref 135–145)
Total Bilirubin: 0.7 mg/dL (ref 0.3–1.2)
Total Protein: 7.5 g/dL (ref 6.5–8.1)

## 2020-05-10 LAB — GLUCOSE, CAPILLARY: Glucose-Capillary: 93 mg/dL (ref 70–99)

## 2020-05-10 LAB — CBC
HCT: 38.6 % — ABNORMAL LOW (ref 39.0–52.0)
Hemoglobin: 13.1 g/dL (ref 13.0–17.0)
MCH: 33.2 pg (ref 26.0–34.0)
MCHC: 33.9 g/dL (ref 30.0–36.0)
MCV: 98 fL (ref 80.0–100.0)
Platelets: 242 10*3/uL (ref 150–400)
RBC: 3.94 MIL/uL — ABNORMAL LOW (ref 4.22–5.81)
RDW: 12.9 % (ref 11.5–15.5)
WBC: 4.9 10*3/uL (ref 4.0–10.5)
nRBC: 0 % (ref 0.0–0.2)

## 2020-05-10 LAB — APTT: aPTT: 35 seconds (ref 24–36)

## 2020-05-10 MED ORDER — LORAZEPAM 2 MG/ML IJ SOLN
1.0000 mg | Freq: Once | INTRAMUSCULAR | Status: AC
  Administered 2020-05-10: 1 mg via INTRAVENOUS
  Filled 2020-05-10: qty 1

## 2020-05-10 NOTE — ED Notes (Signed)
Pt taken to MRI  

## 2020-05-10 NOTE — ED Provider Notes (Signed)
[place] EMERGENCY DEPARTMENT Provider Note   CSN: [id number] Arrival date & time: 05/10/20  1725     History Chief Complaint  Patient presents with  . Weakness    Terry [name] is a 69 y.o. male hx of mitral valve prolapse status post mitral valve replacement here presenting with right arm weakness . Patient was just admitted to the hospital and had MRI that showed multiple acute infarcts in bilateral cerebral and cerebellar hemispheres. Patient was concern for underlying atrial fibrillation so was started on Eliquis. Patient was discharged home yesterday. He states that while in the hospital, he had an episode of weakness but that quickly resolved. He states that this morning when he woke up he noticed that his right arm is weak. He states that he had a hard time picking up anything on the right arm. Denies any slurred speech or numbness.   The history is provided by the patient.       Past Medical History:  Diagnosis Date  . Diverticulosis   . GERD (gastroesophageal reflux disease)   . Heart murmur   . Hypercholesteremia   . Hypertension   . Mitral valve prolapse     Patient Active Problem List   Diagnosis Date Noted  . Stroke (cerebrum) ([place]) 05/08/2020  . Hypercholesteremia   . Hypertension   . Stroke ([place])   . S/P mitral valve replacement   . CKD (chronic kidney disease), stage III   . Leg pain 09/27/2017  . 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 Venous insufficiency 09/27/2017  . GERD (gastroesophageal reflux disease) 09/27/2017    Past Surgical History:  Procedure Laterality Date  . COLONOSCOPY WITH PROPOFOL N/A 03/06/2020   Procedure: COLONOSCOPY WITH PROPOFOL;  Surgeon: Toledo, Benay Pike, MD;  Location: [facility] ENDOSCOPY;  Service: Gastroenterology;  Laterality: N/A;  . MITRAL VALVE REPLACEMENT    . RETINAL DETACHMENT SURGERY    . RIGHT/LEFT HEART CATH AND CORONARY ANGIOGRAPHY Bilateral 02/18/2017   Procedure: Right/Left Heart Cath and  Coronary Angiography;  Surgeon: Isaias Cowman, MD;  Location: [place] [facility];  Service: Cardiovascular;  Laterality: Bilateral;  . RIGHT/LEFT HEART CATH AND CORONARY ANGIOGRAPHY N/A 02/22/2020   Procedure: RIGHT/LEFT HEART CATH AND CORONARY ANGIOGRAPHY;  Surgeon: Isaias Cowman, MD;  Location: [place] [facility];  Service: Cardiovascular;  Laterality: N/A;  . TEMPOROMANDIBULAR JOINT SURGERY         Family History  Problem Relation Age of Onset  . Varicose Veins Mother   . Cancer Father     Social History   Tobacco Use  . Smoking status: Former Smoker    Quit date: 1985    Years since quitting: 36.3  . Smokeless tobacco: Never Used  Substance Use Topics  . Alcohol use: Yes    Comment: ocassionally  . Drug use: No    Home Medications Prior to Admission medications   Medication Sig Start Date End Date Taking? Authorizing Provider  amiodarone (PACERONE) 200 MG tablet Take 1 tablet (200 mg total) by mouth daily. 05/09/20   Max Sane, MD  apixaban (ELIQUIS) 5 MG TABS tablet Take 1 tablet (5 mg total) by mouth 2 (two) times daily. 05/09/20   Max Sane, MD  atorvastatin (LIPITOR) 40 MG tablet Take 1 tablet (40 mg total) by mouth daily. 05/09/20   Max Sane, MD  clobetasol ointment (TEMOVATE) 6.57 % Apply 1 application topically daily as needed (skin irritation).  01/07/17   [provider]  Multiple Vitamin (MULTIVITAMIN  WITH MINERALS) TABS tablet Take 1 tablet by mouth at bedtime.    [provider]  omeprazole (PRILOSEC) 20 MG capsule Take 20 mg by mouth every evening.  01/11/17   [provider]  ondansetron (ZOFRAN) 4 MG tablet Take 4 mg by mouth every 8 (eight) hours as needed. 04/18/20   [provider]  Probiotic Product (PROBIOTIC PO) Take 1 capsule by mouth at bedtime.     [provider]  triamcinolone cream (KENALOG) 0.1 % Apply 1 application topically daily as needed (skin irritation.).  01/11/17   [provider]  valACYclovir (VALTREX) 500 MG tablet Take 500 mg by mouth every evening.  01/11/17   [provider]    Allergies    Penicillins, Ciprofloxacin, and Ivp dye [iodinated diagnostic agents]  Review of Systems   Review of Systems  Neurological: Positive for weakness.  All other systems reviewed and are negative.   Physical Exam Updated Vital Signs BP 113/77 (BP Location: Left Arm)   Pulse 100   Temp (!) 97.5 F (36.4 C) (Oral)   Resp 16   Ht 5\' 6"  (1.676 m)   Wt 79.8 kg   SpO2 99%   BMI 28.41 kg/m   Physical Exam Vitals and nursing note reviewed.  HENT:     Head: Normocephalic.     Nose: Nose normal.     Mouth/Throat:     Mouth: Mucous membranes are moist.  Eyes:     Extraocular Movements: Extraocular movements intact.     Pupils: Pupils are equal, round, and reactive to light.  Cardiovascular:     Rate and Rhythm: Normal rate and regular rhythm.     Pulses: Normal pulses.     Heart sounds: Normal heart sounds.  Pulmonary:     Effort: Pulmonary effort is normal.     Breath sounds: Normal breath sounds.  Abdominal:     General: Abdomen is flat.     Palpations: Abdomen is soft.  Musculoskeletal:        General: Normal range of motion.     Cervical back: Normal range of motion.  Skin:    General: Skin is warm.     Capillary Refill: Capillary refill takes less than 2 seconds.  Neurological:     General: No focal deficit present.     Mental Status: He is alert and oriented to person, place, and time.     Comments: CN 2- 12 intact, strength 4 out of 5 hand grasp and right biceps flexion and triceps extension. Strength is 5 out of 5 in the left upper extremity and bilateral lower extremities. Normal sensation throughout.  Psychiatric:        Behavior: Behavior normal.     ED Results / Procedures / Treatments   Labs (all labs ordered are listed, but only abnormal results are displayed) Labs Reviewed  PROTIME-INR - Abnormal; Notable for the  following components:      Result Value   Prothrombin Time 15.6 (*)    INR 1.3 (*)    All other components within normal limits  CBC - Abnormal; Notable for the following components:   RBC 3.94 (*)    HCT 38.6 (*)    All other components within normal limits  COMPREHENSIVE METABOLIC PANEL - Abnormal; Notable for the following components:   Glucose, Bld 105 (*)    Creatinine, Ser 1.40 (*)    GFR calc non Af Amer 51 (*)    GFR calc Af  59 (*)    All other components within normal limits  APTT  DIFFERENTIAL  GLUCOSE, CAPILLARY  CBG MONITORING, ED  I-STAT CREATININE, ED    EKG None  Radiology CT HEAD WO CONTRAST  Result Date: 05/10/2020 CLINICAL DATA:  Sudden onset weakness EXAM: CT HEAD WITHOUT CONTRAST TECHNIQUE: Contiguous axial images were obtained from the base of the skull through the vertex without intravenous contrast. COMPARISON:  MRI from 05/07/2020, CT from 05/07/2020 FINDINGS: Brain: Linear area of decreased attenuation is noted on the left involving the insula and operculum similar to that seen on recent MRI examination consistent with subacute ischemia. The other areas of recent infarct seen on prior MRI are not as well appreciated. No new focal area of ischemia is seen. No findings to suggest acute hemorrhage or space-occupying mass lesion are noted. Vascular: No hyperdense vessel or unexpected calcification. Skull: Normal. Negative for fracture or focal lesion. Sinuses/Orbits: No acute finding. Other: None. IMPRESSION: Progressive area of decreased attenuation on the left involving the operculum and insula similar to that seen on recent MRI consistent with subacute ischemia. No new focal areas of ischemia or hemorrhage are noted. Electronically Signed   By: Alcide Clever M.D.   On: 05/10/2020 19:05    Procedures Procedures (including critical care time)  Medications Ordered in ED Medications  LORazepam (ATIVAN) injection 1 mg (1 mg Intravenous Given 05/10/20 2210)     ED Course  I have reviewed the triage vital signs and the nursing notes.  Pertinent labs & imaging results that were available during my care of the patient were reviewed by me and considered in my medical decision making (see chart for details).    MDM Rules/Calculators/A&P                      Terry [name] is a 69 y.o. male presenting with right arm weakness. Patient recently had a stroke confirmed on MRI that has bilateral hemispheric and likely from underlying A. Fib. Patient had worsening right arm weakness today. Will get CBC, CMP, MRI brain.   11:31 PM  MRI showed acute to subacute infarct. He also has proximal left PCA stenosis. Given that he has new infarcts, will admit for observation.     Final Clinical Impression(s) / ED Diagnoses Final diagnoses:  None    Rx / DC Orders ED Discharge Orders    None       Charlynne Pander, MD 05/10/20 2345

## 2020-05-10 NOTE — ED Triage Notes (Signed)
Pt presents to ED via POV with c/o sudden onset weakness earlier today, states dropped his cane, states noted shakiness when lifitng his cane and attempting to open his meds. Pt states dx with stroke 3 days ago, only deficit is slurred speech. Pt with equal grips strengths, speech at baseline per patient, A&O x 4. Pt denies numbness at this time.

## 2020-05-10 NOTE — ED Notes (Signed)
Spoke with Dr. Marisa Severin regarding order, VORB for bloodwork and CT scan.

## 2020-05-11 DIAGNOSIS — I639 Cerebral infarction, unspecified: Secondary | ICD-10-CM | POA: Diagnosis not present

## 2020-05-11 DIAGNOSIS — Z7901 Long term (current) use of anticoagulants: Secondary | ICD-10-CM

## 2020-05-11 DIAGNOSIS — I10 Essential (primary) hypertension: Secondary | ICD-10-CM

## 2020-05-11 DIAGNOSIS — N1831 Chronic kidney disease, stage 3a: Secondary | ICD-10-CM

## 2020-05-11 MED ORDER — AMIODARONE HCL 200 MG PO TABS
200.0000 mg | ORAL_TABLET | Freq: Every day | ORAL | Status: DC
  Administered 2020-05-11: 200 mg via ORAL
  Filled 2020-05-11: qty 1

## 2020-05-11 MED ORDER — ATORVASTATIN CALCIUM 20 MG PO TABS
40.0000 mg | ORAL_TABLET | Freq: Every day | ORAL | Status: DC
  Administered 2020-05-11: 40 mg via ORAL
  Filled 2020-05-11: qty 2

## 2020-05-11 MED ORDER — ASPIRIN 81 MG PO TBEC: 81.0000 mg | DELAYED_RELEASE_TABLET | Freq: Every day | ORAL | 0 refills | Status: DC

## 2020-05-11 MED ORDER — PANTOPRAZOLE SODIUM 40 MG PO TBEC
40.0000 mg | DELAYED_RELEASE_TABLET | Freq: Every day | ORAL | Status: DC
  Administered 2020-05-11: 40 mg via ORAL
  Filled 2020-05-11: qty 1

## 2020-05-11 MED ORDER — ASPIRIN EC 81 MG PO TBEC
81.0000 mg | DELAYED_RELEASE_TABLET | Freq: Every day | ORAL | Status: DC
  Administered 2020-05-11: 81 mg via ORAL
  Filled 2020-05-11: qty 1

## 2020-05-11 MED ORDER — APIXABAN 5 MG PO TABS
5.0000 mg | ORAL_TABLET | Freq: Two times a day (BID) | ORAL | Status: DC
  Administered 2020-05-11 (×2): 5 mg via ORAL
  Filled 2020-05-11 (×2): qty 1

## 2020-05-11 MED ORDER — ACETAMINOPHEN 650 MG RE SUPP: 650.0000 mg | RECTAL | Status: DC | PRN

## 2020-05-11 MED ORDER — ACETAMINOPHEN 325 MG PO TABS: 650.0000 mg | ORAL_TABLET | ORAL | Status: DC | PRN

## 2020-05-11 MED ORDER — STROKE: EARLY STAGES OF RECOVERY BOOK: Freq: Once | Status: DC

## 2020-05-11 MED ORDER — ACETAMINOPHEN 160 MG/5ML PO SOLN
650.0000 mg | ORAL | Status: DC | PRN
  Filled 2020-05-11: qty 20.3

## 2020-05-11 NOTE — ED Notes (Signed)
Patient resting quietly with eyes closed in no acute distress.  

## 2020-05-11 NOTE — Progress Notes (Signed)
SLP Cancellation Note  Patient Details Name: Terry [name] MRN: [id number] DOB: 10-Jun-1951   Cancelled treatment:       Reason Eval/Treat Not Completed: (chart reviewed; pt screened at bedside).  Pt alert, awake and verbally conversive. Slight-min Dysarthria noted, possible R oral decreased tone. Pt was fully intelligible during conversation; speech was "improving now". He stated he was discharging as we spoke, and that someone had spoken to him about Outpt ST services when he goes home today. Briefly discussed Intelligibility strategies; gave Handouts on strategies/exs. and gave pt a contact card to Outpt ST services at [facility]. Pt will f/u on Monday w/ PCP and contact Outpt Dept to schedule an appt. MD updated and agreed. Pt agreed w/ plan.     Jerilynn Som, MS, CCC-SLP [name] 05/11/2020, 11:55 AM

## 2020-05-11 NOTE — ED Notes (Signed)
Breakfast meal given 

## 2020-05-11 NOTE — H&P (Signed)
History and Physical    PRINCETON NABOR BTD:[id number] DOB: [birth year] DOA: 05/10/2020  PCP: Gracelyn Nurse, MD   Patient coming from: home  I have personally briefly reviewed patient's old medical records in [facility]  Chief Complaint: Right upper extremity weakness  HPI: Terry [name] is a 69 y.o. male with medical history significant for HTN, GERD, bioprosthetic mitral valve replacement on 04/12/2020, CKD 3, hospitalized from 05/07/2020 to 05/09/2020 with an acute embolic stroke presenting with right arm numbness and difficulty speaking, who returns to the emergency room with complaints of worsening right upper extremity weakness to where he was unable to lift anything with his right arm.  Patient was evaluated by neurology and he was thought to have acute embolic stroke with suspicion for underlying A. fib and was started on Eliquis.  His MRI had shown multiple small acute infarcts.  Carotid Doppler was unremarkable and echocardiogram showed no source of emboli and with EF of 55 to 60%  ED Course:  MRI of the brain as well as MR angio of the head and neck that showed no large vessel occlusion, showed mild enlargement/extension of an acute to early subacute left MCA infarct in the frontal lobe as well as severe proximal left PCA stenosis and moderate proximal left ACA stenosis.  Work-up otherwise unremarkable.  Observation requested.  Patient's right upper extremity weakness was for the most part gone by the time of admission  Review of Systems: As per HPI otherwise 10 point review of systems negative.    Past Medical History:  Diagnosis Date  . Diverticulosis   . GERD (gastroesophageal reflux disease)   . Heart murmur   . Hypercholesteremia   . Hypertension   . Mitral valve prolapse     Past Surgical History:  Procedure Laterality Date  . COLONOSCOPY WITH PROPOFOL N/A 03/06/2020   Procedure: COLONOSCOPY WITH PROPOFOL;  Surgeon: Toledo, Boykin Nearing, MD;  Location: [facility] ENDOSCOPY;   Service: Gastroenterology;  Laterality: N/A;  . MITRAL VALVE REPLACEMENT    . RETINAL DETACHMENT SURGERY    . RIGHT/LEFT HEART CATH AND CORONARY ANGIOGRAPHY Bilateral 02/18/2017   Procedure: Right/Left Heart Cath and Coronary Angiography;  Surgeon: Marcina Millard, MD;  Location: [facility] INVASIVE CV LAB;  Service: Cardiovascular;  Laterality: Bilateral;  . RIGHT/LEFT HEART CATH AND CORONARY ANGIOGRAPHY N/A 02/22/2020   Procedure: RIGHT/LEFT HEART CATH AND CORONARY ANGIOGRAPHY;  Surgeon: Marcina Millard, MD;  Location: [facility] INVASIVE CV LAB;  Service: Cardiovascular;  Laterality: N/A;  . TEMPOROMANDIBULAR JOINT SURGERY       reports that he quit smoking about 36 years ago. He has never used smokeless tobacco. He reports current alcohol use. He reports that he does not use drugs.  Allergies  Allergen Reactions  . Penicillins Shortness Of Breath    Did it involve swelling of the face/tongue/throat, SOB, or low BP? Yes Did it involve sudden or severe rash/hives, skin peeling, or any reaction on the inside of your mouth or nose? Unknown Did you need to seek medical attention at a hospital or doctor's office? Was at a clinic when reaction occurred When did it last happen?More than 50 years ago If all above answers are "NO", may proceed with cephalosporin use.   . Ciprofloxacin Swelling and Rash  . Ivp Dye [Iodinated Diagnostic Agents] Rash    Family History  Problem Relation Age of Onset  . Varicose Veins Mother   . Cancer Father      Prior to Admission medications  Medication Sig Start Date End Date Taking? Authorizing Provider  amiodarone (PACERONE) 200 MG tablet Take 1 tablet (200 mg total) by mouth daily. 05/09/20   Delfino Lovett, MD  apixaban (ELIQUIS) 5 MG TABS tablet Take 1 tablet (5 mg total) by mouth 2 (two) times daily. 05/09/20   Delfino Lovett, MD  atorvastatin (LIPITOR) 40 MG tablet Take 1 tablet (40 mg total) by mouth daily. 05/09/20   Delfino Lovett, MD  clobetasol ointment  (TEMOVATE) 0.05 % Apply 1 application topically daily as needed (skin irritation).  01/07/17   [provider]  Multiple Vitamin (MULTIVITAMIN WITH MINERALS) TABS tablet Take 1 tablet by mouth at bedtime.    [provider]  omeprazole (PRILOSEC) 20 MG capsule Take 20 mg by mouth every evening.  01/11/17   [provider]  ondansetron (ZOFRAN) 4 MG tablet Take 4 mg by mouth every 8 (eight) hours as needed. 04/18/20   [provider]  Probiotic Product (PROBIOTIC PO) Take 1 capsule by mouth at bedtime.     [provider]  triamcinolone cream (KENALOG) 0.1 % Apply 1 application topically daily as needed (skin irritation.).  01/11/17   [provider]  valACYclovir (VALTREX) 500 MG tablet Take 500 mg by mouth every evening.  01/11/17   [provider]    Physical Exam: Vitals:   05/10/20 1741 05/10/20 1751 05/10/20 2350 05/11/20 0000  BP:  113/77 (!) 123/95 (!) 123/91  Pulse:  100  81  Resp:  16 17 16   Temp:  (!) 97.5 F (36.4 C)    TempSrc:  Oral    SpO2:  99%  99%  Weight: 79.4 kg 79.8 kg    Height: 5\' 6"  (1.676 m) 5\' 6"  (1.676 m)       Vitals:   05/10/20 1741 05/10/20 1751 05/10/20 2350 05/11/20 0000  BP:  113/77 (!) 123/95 (!) 123/91  Pulse:  100  81  Resp:  16 17 16   Temp:  (!) 97.5 F (36.4 C)    TempSrc:  Oral    SpO2:  99%  99%  Weight: 79.4 kg 79.8 kg    Height: 5\' 6"  (1.676 m) 5\' 6"  (1.676 m)      Constitutional: Alert and awake, oriented x3, not in any acute distress. Eyes: PERLA, EOMI, irises appear normal, anicteric sclera,  ENMT: external ears and nose appear normal, normal hearing             Lips appears normal, oropharynx mucosa, tongue, posterior pharynx appear normal  Neck: neck appears normal, no masses, normal ROM, no thyromegaly, no JVD  CVS: S1-S2 clear, no murmur rubs or gallops,  , no carotid bruits, pedal pulses palpable, No LE edema Respiratory:  clear to auscultation bilaterally, no  wheezing, rales or rhonchi. Respiratory effort normal. No accessory muscle use.  Abdomen: soft nontender, nondistended, normal bowel sounds, no hepatosplenomegaly, no hernias Musculoskeletal: : no cyanosis, clubbing , no contractures or atrophy Neuro: Cranial nerves II-XII intact,.  Patient does have dysarthric speech but his wife at the bedside said that that is his norm from prior to the stroke.  Strength minimally reduced right upper extremity 4/5 psych: judgement and insight appear normal, stable mood and affect,  Skin: no rashes or lesions or ulcers, no induration or nodules   Labs on Admission: I have personally reviewed following labs and imaging studies  CBC: Recent Labs  Lab 05/07/20 1437 05/10/20 1754  WBC 4.7 4.9  NEUTROABS 2.5 2.6  HGB 13.7  13.1  HCT 39.2 38.6*  MCV 95.4 98.0  PLT 202 242   Basic Metabolic Panel: Recent Labs  Lab 05/07/20 1437 05/10/20 1754  NA 139 137  K 3.9 4.6  CL 105 103  CO2 25 26  GLUCOSE 134* 105*  BUN 13 17  CREATININE 1.37* 1.40*  CALCIUM 9.6 9.6   GFR: Estimated Creatinine Clearance: 48.8 mL/min (A) (by C-G formula based on SCr of 1.4 mg/dL (H)). Liver Function Tests: Recent Labs  Lab 05/07/20 1437 05/10/20 1754  AST 21 30  ALT 22 31  ALKPHOS 74 64  BILITOT 0.7 0.7  PROT 7.5 7.5  ALBUMIN 3.9 4.2   No results for input(s): LIPASE, AMYLASE in the last 168 hours. No results for input(s): AMMONIA in the last 168 hours. Coagulation Profile: Recent Labs  Lab 05/07/20 1437 05/10/20 1754  INR 1.1 1.3*   Cardiac Enzymes: No results for input(s): CKTOTAL, CKMB, CKMBINDEX, TROPONINI in the last 168 hours. BNP (last 3 results) No results for input(s): PROBNP in the last 8760 hours. HbA1C: Recent Labs    05/08/20 0324  HGBA1C 5.4   CBG: Recent Labs  Lab 05/07/20 1438 05/10/20 1800  GLUCAP 133* 93   Lipid Profile: Recent Labs    05/08/20 0324  CHOL 230*  HDL 30*  LDLCALC 172*  TRIG 142  CHOLHDL 7.7    Thyroid Function Tests: No results for input(s): TSH, T4TOTAL, FREET4, T3FREE, THYROIDAB in the last 72 hours. Anemia Panel: No results for input(s): VITAMINB12, FOLATE, FERRITIN, TIBC, IRON, RETICCTPCT in the last 72 hours. Urine analysis: No results found for: COLORURINE, APPEARANCEUR, LABSPEC, PHURINE, GLUCOSEU, HGBUR, BILIRUBINUR, KETONESUR, PROTEINUR, UROBILINOGEN, NITRITE, LEUKOCYTESUR  Radiological Exams on Admission: CT HEAD WO CONTRAST  Result Date: 05/10/2020 CLINICAL DATA:  Sudden onset weakness EXAM: CT HEAD WITHOUT CONTRAST TECHNIQUE: Contiguous axial images were obtained from the base of the skull through the vertex without intravenous contrast. COMPARISON:  MRI from 05/07/2020, CT from 05/07/2020 FINDINGS: Brain: Linear area of decreased attenuation is noted on the left involving the insula and operculum similar to that seen on recent MRI examination consistent with subacute ischemia. The other areas of recent infarct seen on prior MRI are not as well appreciated. No new focal area of ischemia is seen. No findings to suggest acute hemorrhage or space-occupying mass lesion are noted. Vascular: No hyperdense vessel or unexpected calcification. Skull: Normal. Negative for fracture or focal lesion. Sinuses/Orbits: No acute finding. Other: None. IMPRESSION: Progressive area of decreased attenuation on the left involving the operculum and insula similar to that seen on recent MRI consistent with subacute ischemia. No new focal areas of ischemia or hemorrhage are noted. Electronically Signed   By: Alcide Clever M.D.   On: 05/10/2020 19:05   MR ANGIO HEAD WO CONTRAST  Result Date: 05/10/2020 CLINICAL DATA:  Right arm weakness. Recent stroke. EXAM: MRI HEAD WITHOUT CONTRAST MRA HEAD WITHOUT CONTRAST TECHNIQUE: Multiplanar, multiecho pulse sequences of the brain and surrounding structures were obtained without intravenous contrast. Angiographic images of the head were obtained using MRA  technique without contrast. COMPARISON:  Head CT 05/10/2020 and MRI 05/07/2020 FINDINGS: MRI HEAD FINDINGS Brain: An acute to early subacute left MCA infarct involving the insula and frontal operculum has mildly enlarged from the prior MRI with greater extension superiorly in the left frontal lobe. There is persistent diffusion abnormality associated with recent punctate infarcts involving white matter in the posterior right frontal, right parietal, and right occipital lobes as well as punctate infarcts  in the cerebellum bilaterally without significant enlargement. Diffusion abnormality associated with a small right parietal cortical infarct has diminished. No intracranial hemorrhage, mass, midline shift, or extra-axial fluid collection is identified. The ventricles and sulci are normal. Vascular: Major intracranial vascular flow voids are preserved. Skull and upper cervical spine: Unremarkable bone marrow signal. Sinuses/Orbits: Right cataract extraction. Paranasal sinuses and mastoid air cells are clear. Other: None. MRA HEAD FINDINGS The visualized distal vertebral arteries are widely patent to the basilar and codominant. The right PICA and left AICA appear dominant. Patent SCAs are seen bilaterally. The basilar artery is widely patent. Posterior communicating arteries are not clearly identified and may be diminutive or absent. Both PCAs are patent without evidence of significant proximal stenosis on the left. There is a moderate to severe left PCA stenosis near the P1-P2 junction. The internal carotid arteries are widely patent from skull base to carotid termini. MCAs are patent without evidence of proximal branch occlusion or significant proximal stenosis. ACAs are patent without significant proximal stenosis on the right. There is a moderate proximal left A1 stenosis. No aneurysm is identified. IMPRESSION: 1. Mild enlargement/superior extension of an acute to early subacute left MCA infarct in the frontal  lobe. 2. Evolving punctate infarcts elsewhere in the cerebrum and cerebellum as seen on the recent prior MRI. 3. No large vessel occlusion. 4. 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 Electronically Signed   By: Sebastian AcheAllen  Grady M.D.   On: 05/10/2020 23:17   MR BRAIN WO CONTRAST  Result Date: 05/10/2020 CLINICAL DATA:  Right arm weakness. Recent stroke. EXAM: MRI HEAD WITHOUT CONTRAST MRA HEAD WITHOUT CONTRAST TECHNIQUE: Multiplanar, multiecho pulse sequences of the brain and surrounding structures were obtained without intravenous contrast. Angiographic images of the head were obtained using MRA technique without contrast. COMPARISON:  Head CT 05/10/2020 and MRI 05/07/2020 FINDINGS: MRI HEAD FINDINGS Brain: An acute to early subacute left MCA infarct involving the insula and frontal operculum has mildly enlarged from the prior MRI with greater extension superiorly in the left frontal lobe. There is persistent diffusion abnormality associated with recent punctate infarcts involving white matter in the posterior right frontal, right parietal, and right occipital lobes as well as punctate infarcts in the cerebellum bilaterally without significant enlargement. Diffusion abnormality associated with a small right parietal cortical infarct has diminished. No intracranial hemorrhage, mass, midline shift, or extra-axial fluid collection is identified. The ventricles and sulci are normal. Vascular: Major intracranial vascular flow voids are preserved. Skull and upper cervical spine: Unremarkable bone marrow signal. Sinuses/Orbits: Right cataract extraction. Paranasal sinuses and mastoid air cells are clear. Other: None. MRA HEAD FINDINGS The visualized distal vertebral arteries are widely patent to the basilar and codominant. The right PICA and left AICA appear dominant. Patent SCAs are seen bilaterally. The basilar artery is widely patent. Posterior communicating arteries are not clearly  identified and may be diminutive or absent. Both PCAs are patent without evidence of significant proximal stenosis on the left. There is a moderate to severe left PCA stenosis near the P1-P2 junction. The internal carotid arteries are widely patent from skull base to carotid termini. MCAs are patent without evidence of proximal branch occlusion or significant proximal stenosis. ACAs are patent without significant proximal stenosis on the right. There is a moderate proximal left A1 stenosis. No aneurysm is identified. IMPRESSION: 1. Mild enlargement/superior extension of an acute to early subacute left MCA infarct in the frontal lobe. 2. Evolving punctate infarcts elsewhere in the  cerebrum and cerebellum as seen on the recent prior MRI. 3. No large vessel occlusion. 4. 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 Electronically Signed   By: Logan Bores M.D.   On: 05/10/2020 23:17    EKG: Independently reviewed.   Assessment/Plan Principal Problem:   Acute CVA (cerebrovascular accident) [facility]) Active Problems:   Chronic anticoagulation   Hypertension   History of mitral valve replacement   CKD (chronic kidney disease), stage III   Assessment acute embolic stroke/acute stroke extension -Patient presenting with new right upper extremity weakness following recent hospitalization from 05/07/2020 to 7/68/1157 with embolic CVA with resolution of symptoms, started on Eliquis -MRI/MRA shows mild enlargement/superior extension of an acute to early subacute left MCA infarct in the frontal lobe as well as evolving punctate infarcts elsewhere and without LVO -Patient was not a candidate for TPA given Eliquis therapy, and improvement in symptoms -Continue Eliquis, continue statins -No additional work-up at this time.  Did have unremarkable carotid Dopplers and echocardiogram in the past week. -Continuous cardiac monitoring to evaluate for arrhythmias/A. fib -Speech PT and OT  consults -N.p.o. until cleared to swallow  GERD (gastroesophageal reflux disease) -protonix  Hypercholesteremia -lipitor  Hypertension -Allow permissive hypertension-IV prn hydralazine for SBP >220, or DBP>120  S/P mitral valve replacement:  -Recently performed replacement 04/12/2020 at the [facility] -Continue amiodarone  CKD (chronic kidney disease), stage IIIa: Stable -Follow-up with BMP    DVT prophylaxis: On Eliquis Code Status: full code  Family Communication:  none  Disposition Plan: Back to previous home environment Consults called: Neurology Status:obs    Athena Masse MD [facility]     05/11/2020, 12:09 AM

## 2020-05-11 NOTE — Discharge Summary (Signed)
[place] at [facility]   PATIENT NAME: Terry [name]    MR#:  [id number]  DATE OF BIRTH:  1951-05-07  DATE OF ADMISSION:  05/07/2020   ADMITTING PHYSICIAN: Delfino Lovett, MD  DATE OF DISCHARGE: 05/09/2020 10:11 AM  PRIMARY CARE PHYSICIAN: Gracelyn Nurse, MD   ADMISSION DIAGNOSIS:  Slurred speech [R47.81] Stroke [facility]) [I63.9] Stroke (cerebrum) (HCC) [I63.9] DISCHARGE DIAGNOSIS:  Principal Problem:   Stroke [facility]) Active Problems:   GERD (gastroesophageal reflux disease)   Hypercholesteremia   Hypertension   History of mitral valve replacement   CKD (chronic kidney disease), stage III   Acute CVA (cerebrovascular accident) (HCC)  SECONDARY DIAGNOSIS:   Past Medical History:  Diagnosis Date  . Diverticulosis   . GERD (gastroesophageal reflux disease)   . Heart murmur   . Hypercholesteremia   . Hypertension   . Mitral valve prolapse    HOSPITAL COURSE:  69 y.o.malewith a history of HLD, HTN and mitral valve prolapse s/p mitral valve replacement on 04/12/20 is admitted for acute stroke  Acute embolic stroke: CT-head negative. -seen by NeuroDr. Thad Ranger - MRI of the brain shows multiple small acute infarcts in the bilateral cerebral and cerebellar hemispheres.  - Carotid dopplers show no evidence of hemodynamically significant stenosis.  -Echocardiogram within normal limits.  No clots seen.A1c 5.4, LDL172. -Discontinued aspirin and Plavix and start Eliquis per Neuro. -This is likely due to paroxysmal A. fib  Paroxysmal A. fib Cardiology recommends continuing amiodarone for rate control and Eliquis for anticoagulation  GERD (gastroesophageal reflux disease) -protonix  Hypercholesteremia -lipitor  Hypertension -well controlled  S/P mitral valve replacement:Recently performed replacement 04/12/2020 at the [facility] -onamiodarone& Eliquis   CKD (chronic kidney disease), stage IIIa: Stable  DISCHARGE CONDITIONS:  stable CONSULTS OBTAINED:   Treatment Team:  Kym Groom, MD Dalia Heading, MD DRUG ALLERGIES:   Allergies  Allergen Reactions  . Penicillins Shortness Of Breath    Did it involve swelling of the face/tongue/throat, SOB, or low BP? Yes Did it involve sudden or severe rash/hives, skin peeling, or any reaction on the inside of your mouth or nose? Unknown Did you need to seek medical attention at a hospital or doctor's office? Was at a clinic when reaction occurred When did it last happen?More than 50 years ago If all above answers are "NO", may proceed with cephalosporin use.   . Ciprofloxacin Swelling and Rash  . Ivp Dye [Iodinated Diagnostic Agents] Rash   DISCHARGE MEDICATIONS:   Allergies as of 05/09/2020      Reactions   Penicillins Shortness Of Breath   Did it involve swelling of the face/tongue/throat, SOB, or low BP? Yes Did it involve sudden or severe rash/hives, skin peeling, or any reaction on the inside of your mouth or nose? Unknown Did you need to seek medical attention at a hospital or doctor's office? Was at a clinic when reaction occurred When did it last happen?More than 50 years ago If all above answers are "NO", may proceed with cephalosporin use.   Ciprofloxacin Swelling, Rash   Ivp Dye [iodinated Diagnostic Agents] Rash      Medication List    STOP taking these medications   aspirin 81 MG chewable tablet   furosemide 40 MG tablet Commonly known as: LASIX   Klor-Con M20 20 MEQ tablet Generic drug: potassium chloride SA     TAKE these medications   amiodarone 200 MG tablet Commonly known as: PACERONE Take 1 tablet (200 mg total) by mouth daily. What  changed:  how much to take how to take this when to take this   apixaban 5 MG Tabs tablet Commonly known as: ELIQUIS Take 1 tablet (5 mg total) by mouth 2 (two) times daily.   atorvastatin 40 MG tablet Commonly known as: LIPITOR Take 1 tablet (40 mg total) by mouth daily.   clobetasol ointment 0.05 % Commonly  known as: TEMOVATE Apply 1 application topically daily as needed (skin irritation).   multivitamin with minerals Tabs tablet Take 1 tablet by mouth at bedtime.   omeprazole 20 MG capsule Commonly known as: PRILOSEC Take 20 mg by mouth every evening.   ondansetron 4 MG tablet Commonly known as: ZOFRAN Take 4 mg by mouth every 8 (eight) hours as needed.   triamcinolone cream 0.1 % Commonly known as: KENALOG Apply 1 application topically daily as needed (skin irritation.).   valACYclovir 500 MG tablet Commonly known as: VALTREX Take 500 mg by mouth every evening.      DISCHARGE INSTRUCTIONS:   DIET:  Low fat, Low cholesterol diet DISCHARGE CONDITION:  Stable ACTIVITY:  Activity as tolerated OXYGEN:  Home Oxygen: No.  Oxygen Delivery: room air DISCHARGE LOCATION:  home   If you experience worsening of your admission symptoms, develop shortness of breath, life threatening emergency, suicidal or homicidal thoughts you must seek medical attention immediately by calling 911 or calling your MD immediately  if symptoms less severe.  You Must read complete instructions/literature along with all the possible adverse reactions/side effects for all the Medicines you take and that have been prescribed to you. Take any new Medicines after you have completely understood and accpet all the possible adverse reactions/side effects.   Please note  You were cared for by a hospitalist during your hospital stay. If you have any questions about your discharge medications or the care you received while you were in the hospital after you are discharged, you can call the unit and asked to speak with the hospitalist on call if the hospitalist that took care of you is not available. Once you are discharged, your primary care physician will handle any further medical issues. Please note that NO REFILLS for any discharge medications will be authorized once you are discharged, as it is imperative that you  return to your primary care physician (or establish a relationship with a primary care physician if you do not have one) for your aftercare needs so that they can reassess your need for medications and monitor your lab values.    On the day of Discharge:  VITAL SIGNS:  Blood pressure 119/82, pulse 87, temperature 98 F (36.7 C), temperature source Oral, resp. rate 16, height 5\' 6"  (1.676 m), weight 79.4 kg, SpO2 99 %. PHYSICAL EXAMINATION:  GENERAL:  69 y.o.-year-old patient lying in the bed with no acute distress.  EYES: Pupils equal, round, reactive to light and accommodation. No scleral icterus. Extraocular muscles intact.  HEENT: Head atraumatic, normocephalic. Oropharynx and nasopharynx clear.  NECK:  Supple, no jugular venous distention. No thyroid enlargement, no tenderness.  LUNGS: Normal breath sounds bilaterally, no wheezing, rales,rhonchi or crepitation. No use of accessory muscles of respiration.  CARDIOVASCULAR: S1, S2 normal. No murmurs, rubs, or gallops.  ABDOMEN: Soft, non-tender, non-distended. Bowel sounds present. No organomegaly or mass.  EXTREMITIES: No pedal edema, cyanosis, or clubbing.  NEUROLOGIC: Cranial nerves II through XII are intact. Muscle strength 5/5 in all extremities. Sensation intact. Gait not checked.  PSYCHIATRIC: The patient is alert and oriented x 3.  SKIN: No obvious rash, lesion, or ulcer.  DATA REVIEW:   CBC Recent Labs  Lab 05/10/20 1754  WBC 4.9  HGB 13.1  HCT 38.6*  PLT 242    Chemistries  Recent Labs  Lab 05/10/20 1754  NA 137  K 4.6  CL 103  CO2 26  GLUCOSE 105*  BUN 17  CREATININE 1.40*  CALCIUM 9.6  AST 30  ALT 31  ALKPHOS 64  BILITOT 0.7     Outpatient follow-up Follow-up Information    Baxter Hire, MD. Go on 05/15/2020.   Specialty: Internal Medicine Why: at 3:45 p.m. Contact information: [place] [address] [phone]        Vladimir Crofts, MD. Go on 05/22/2020.    Specialty: Neurology Why: at 10:00 a.m. Contact information: [facility] [place] [address] [phone]        Isaias Cowman, MD. Go on 05/23/2020.   Specialty: Cardiology Why: at 10:00 a.m. Contact information: [facility] [place] [address] [phone]            Management plans discussed with the patient, family and they are in agreement.  CODE STATUS: Prior   TOTAL TIME TAKING CARE OF THIS PATIENT: 45 minutes.    Max Sane M.D on 05/11/2020 at 8:16 PM  [facility]   CC: Primary care physician; Baxter Hire, MD   Note: This dictation was prepared with Dragon dictation along with smaller phrase technology. Any transcriptional errors that result from this process are unintentional.

## 2020-05-11 NOTE — Discharge Instructions (Signed)
Hospital Discharge After a Stroke  °Being discharged from the hospital after a stroke can feel overwhelming. Many things may be different, and it is normal to feel scared or anxious. Some stroke survivors may be able to return to their homes, and others may need more specialized care on a temporary or permanent basis. °Your stroke care team will work with you to develop a discharge plan that is best for you. Ask questions if you do not understand something. Invite a friend or family member to participate in discharge planning. Understanding and following your discharge plan can help to prevent another stroke or other problems. °Understanding your medicines °After a stroke, your health care provider may prescribe one or more types of medicine. It is important to take medicines exactly as told by your health care provider. Serious harm, such as another stroke, can happen if you are unable to take your medicine exactly as prescribed. Make sure you understand: °· What medicine to take. °· Why you are taking the medicine. °· How and when to take it. °· If it can be taken with your other medicines and herbal supplements. °· Possible side effects. °· When to call your health care provider if you have any side effects. °· How you will get and pay for your medicines. Medical assistance programs may be able to help you pay for prescription medicines if you cannot afford them. °If you are taking an anticoagulant, be sure to take it exactly as told by your health care provider. This type of medicine can increase the risk of bleeding because it works to prevent blood from clotting. You may need to take certain precautions to prevent bleeding. You should contact your health care provider if you have: °· Bleeding or bruising. °· A fall or other injury to your head. °· Blood in your urine or stool (feces). °Planning for home safety ° °Take steps to prevent falls, such as installing grab bars or using a shower chair. Ask a friend or  family member to get needed things in place before you go home if possible. A therapist can come to your home to make recommendations for safety equipment. Ask your health care provider if you would benefit from this service or from home care. °Getting needed equipment °Ask your health care provider for a list of any medical equipment and supplies you will need at home. These may include items such as: °· Walkers. °· Canes. °· Wheelchairs. °· Hand-strengthening devices. °· Special eating utensils. °Medical equipment can be rented or purchased, depending on your insurance coverage. Check with your insurance company about what is covered. °Keeping follow-up visits °After a stroke, you will need to follow up regularly with a health care provider. You may also need rehabilitation, which can include physical therapy, occupational therapy, or speech-language therapy. °Keeping these appointments is very important to your recovery after a stroke. Be sure to bring your medicine list and discharge papers with you to your appointments. If you need help to keep track of your schedule, use a calendar or appointment reminder. °Preventing another stroke °Having a stroke puts you at risk for another stroke in the future. Ask your health care provider what actions you can take to lower the risk. These may include: °· Increasing how much you exercise. °· Making a healthy eating plan. °· Quitting smoking. °· Managing other health conditions, such as high blood pressure, high cholesterol, or diabetes. °· Limiting alcohol use. °Knowing the warning signs of a stroke ° °Make sure   you understand the signs of a stroke. Before you leave the hospital, you will receive information outlining the stroke warning signs. Share these with your friends and family members. °"BE FAST" is an easy way to remember the main warning signs of a stroke: °· B - Balance. Signs are dizziness, sudden trouble walking, or loss of balance. °· E - Eyes. Signs are  trouble seeing or a sudden change in vision. °· F - Face. Signs are sudden weakness or numbness of the face, or the face or eyelid drooping on one side. °· A - Arms. Signs are weakness or numbness in an arm. This happens suddenly and usually on one side of the body. °· S - Speech. Signs are sudden trouble speaking, slurred speech, or trouble understanding what people say. °· T - Time. Time to call emergency services. Write down what time symptoms started. °Other signs of stroke may include: °· A sudden, severe headache with no known cause. °· Nausea or vomiting. °· Seizure. °These symptoms may represent a serious problem that is an emergency. Do not wait to see if the symptoms will go away. Get medical help right away. Call your local emergency services (911 in the [place] Do not drive yourself to the hospital. °Make note of the time that you had your first symptoms. Your emergency responders or emergency room staff will need to know this information. °Summary °· Being discharged from the hospital after a stroke can feel overwhelming. It is normal to feel scared or anxious. °· Make sure you take medicines exactly as told by your health care provider. °· Know the warning signs of a stroke, and get help right way if you have any of these symptoms. "BE FAST" is an easy way to remember the main warning signs of a stroke. °This information is not intended to replace advice given to you by your health care provider. Make sure you discuss any questions you have with your health care provider. °Document Revised: 09/06/2019 Document Reviewed: 03/19/2017 °Elsevier Patient Education © 2020 [employer] ° ° °Physical Therapy After a Stroke °After a stroke, some people experience physical changes or problems. Physical therapy may be prescribed to help you recover and overcome problems such as: °· Inability to move (paralysis) or weakness, typically affecting one side of the body. °· Trouble with balance. °· Pain, a pins and  needles sensation, or numbness in certain parts of the body. You may also have difficulty feeling touch, pressure, or changes in temperature. °· Involuntary muscle tightening (spasticity). °· Stiffness in muscles and joints. °· Altered coordination and reflexes. °What causes physical disability after a stroke? °A stroke can damage parts of your brain that control your body's normal functions, including your ability to move and to keep your balance. °The types of physical problems you have will depend on how severe the stroke was and where it was located in the brain. Weakness or paralysis may affect just your fingers and hands, a whole leg or arm, or an entire side of your body. °What is physical therapy? °Physical therapy involves using exercises, stretches, and activities to help you regain movement and independence after your stroke. Physical therapy may focus on one or more of the following: °· Range of motion. This can help with movement and reduce muscle stiffness. °· Balance. This helps to lower your risk of falling. °· Position changes or transfers, such as moving from sitting to standing or from a chair to a bed. °· Coordination, such as getting an   object from a shelf. °· Muscle strength. Muscles may be strengthened with weights or by repeating certain motions. °· Functional mobility. This may include stair training or learning how to use a wheelchair, walker, or cane. °· Walking (gait training). °· Activities of daily living, such as getting out of the car or buttoning a shirt. °Why is physical therapy important? °It is important to do exercises and follow your rehabilitation plan as told by your physical therapist. Physical therapy can: °· Help you regain independence. °· Prevent injury from falls by building strength and balance. °· Lower your risk of blood clots. °· Lower your risk of skin sores (pressure injuries). °· Increase physical activity and exercise. This may help lower your risk for another  stroke. °· Help reduce pain. °When will therapy start and where will I have therapy? °Your health care provider will decide when it is best for you to start therapy. In some cases, people start rehabilitation, including physical therapy, as soon as they are medically stable, which may be 24-48 hours after a stroke. °Rehabilitation can take place in a few different places, based on your needs. It may take place in: °· The hospital or an in-patient rehabilitation hospital. °· An outpatient rehabilitation facility. °· A long-term care facility. °· A community rehabilitation clinic. °· Your home. °What are assistive devices? °Assistive devices are tools to help you move, maintain balance, and manage daily tasks while recovering from a stroke. Your physical therapist may recommend and help you learn to use: °· Equipment to help you move, such as wheelchairs, canes, or walkers. °· Braces or splints to keep your arms, hands, legs, or feet in a comfortable and safe position. °· Bathtub benches or grab bars to keep you safe in the bathroom. °· Special utensils, bowls, and plates that allow you to eat with one hand. °It is important to use these devices as told by your health care provider. °Summary °· After a stroke, some people may experience physical disabilities, such as weakness or paralysis, pain, or balance problems. °· Physical therapy involves exercises, stretches, and activities that help to improve your ability to move and to handle daily tasks. °· Physical therapy exercises focus on restoring range of motion, balance, coordination, muscle strength, and the ability to move (mobility). °· Physical therapy can help you regain independence, prevent falls, and allow you to live a more active lifestyle after a stroke. °This information is not intended to replace advice given to you by your health care provider. Make sure you discuss any questions you have with your health care provider. °Document Revised: 04/06/2019  Document Reviewed: 03/22/2017 °Elsevier Patient Education © 2020 [employer] Inc. ° °

## 2020-05-11 NOTE — Progress Notes (Signed)
OT Cancellation Note  Patient Details Name: DECARLOS EMPEY MRN: [id number] DOB: [birth year]   Cancelled Treatment:    Reason Eval/Treat Not Completed: OT screened, no needs identified, will sign off. OT order received and chart reviewed. Per [facility] and conversation c PT. Pt is at baseline and has no identified acute OT needs. Will sign off at this time, please re-consult if new needs arise.   Kathie Dike, M.S. OTR/L  05/11/20, 12:41 PM

## 2020-05-11 NOTE — ED Notes (Signed)
Meal given with MD York Cerise permission

## 2020-05-11 NOTE — ED Provider Notes (Signed)
-----------------------------------------   12:06 AM on 05/11/2020 -----------------------------------------  Discussed case by phone with Dr. Para March with the hospitalist service who will admit.  Patient is not a candidate for TPA given recent stroke and current anticoagulation.   Loleta Rose, MD 05/11/20 [phone]

## 2020-05-11 NOTE — ED Notes (Signed)
Patient resting quietly in no acute distress at this time.  

## 2020-05-11 NOTE — Evaluation (Signed)
Physical Therapy Evaluation Patient Details Name: Terry [name] MRN: [id number] DOB: [birth year] Today's Date: 05/11/2020   History of Present Illness  69 y.o. male here earlier this week (5/11) with R UE numbness and wording finding deficits/difficulty speaking/slurred speech.  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  PMH includes htn, mitral valve prolapse (s/p MVR 04/12/20).  Returned home and had R UE weakness/coordination changes that are resolved by the time PT evaled him in ED.  Clinical Impression  Pt was able to ambulate around the ED unit w/o AD and w/o assist.  Pt does not have any residual R sided weakness/coordination issues and generally feels very good about being able to return home.  No further PT needs, will sign off.     Follow Up Recommendations No PT follow up(pt not interested in outpt or HHPT)    Equipment Recommendations  None recommended by PT    Recommendations for Other Services       Precautions / Restrictions Precautions Precautions: Fall Restrictions Weight Bearing Restrictions: No      Mobility  Bed Mobility Overal bed mobility: Independent                Transfers Overall transfer level: Independent Equipment used: None             General transfer comment: steady safe and confidence transfers   Ambulation/Gait Ambulation/Gait assistance: Supervision Gait Distance (Feet): 350 Feet Assistive device: None       General Gait Details: Pt was able to ambulate with consistent cadence and confidence, no LOBs and stable vitals t/o the effort   Stairs            Wheelchair Mobility    Modified Rankin (Stroke Patients Only)       Balance Overall balance assessment: Needs assistance Sitting-balance support: No upper extremity supported;Feet supported Sitting balance-Leahy Scale: Normal       Standing balance-Leahy Scale:  Good Standing balance comment: stable and confident, no LOBs                             Pertinent Vitals/Pain      Home Living Family/patient expects to be discharged to:: Private residence Living Arrangements: Spouse/significant other Available Help at Discharge: Family Type of Home: House Home Access: Stairs to enter Entrance Stairs-Rails: None Entrance Stairs-Number of Steps: 4 Home Layout: One level Home Equipment: Cane - single point;Walker - 4 wheels      Prior Function Level of Independence: Independent         Comments: has walker and cane but does not NEED to use them     Hand Dominance   Dominant Hand: Right    Extremity/Trunk Assessment   Upper Extremity Assessment Upper Extremity Assessment: Overall WFL for tasks assessed(chronic R RTC issues, minimally weaker than L)    Lower Extremity Assessment Lower Extremity Assessment: Overall WFL for tasks assessed       Communication   Communication: No difficulties  Cognition Arousal/Alertness: Awake/alert Behavior During Therapy: WFL for tasks assessed/performed Overall Cognitive Status: Within Functional Limits for tasks assessed                                        General Comments General comments (skin integrity, edema, etc.): Pt reports feeling  back to his new baseline    Exercises     Assessment/Plan    PT Assessment Patent does not need any further PT services  PT Problem List Decreased activity tolerance;Decreased balance;Decreased safety awareness;Decreased strength;Decreased range of motion       PT Treatment Interventions DME instruction;Gait training;Stair training;Functional mobility training;Therapeutic activities;Therapeutic exercise;Balance training;Patient/family education    PT Goals (Current goals can be found in the Care Plan section)  Acute Rehab PT Goals Patient Stated Goal: to go home PT Goal Formulation: All assessment and education  complete, DC therapy    Frequency Min 2X/week   Barriers to discharge        Co-evaluation               AM-PAC PT "6 Clicks" Mobility  Outcome Measure Help needed turning from your back to your side while in a flat bed without using bedrails?: None Help needed moving from lying on your back to sitting on the side of a flat bed without using bedrails?: None Help needed moving to and from a bed to a chair (including a wheelchair)?: None Help needed standing up from a chair using your arms (e.g., wheelchair or bedside chair)?: None Help needed to walk in hospital room?: None Help needed climbing 3-5 steps with a railing? : None 6 Click Score: 24    End of Session Equipment Utilized During Treatment: Gait belt Activity Tolerance: Patient tolerated treatment well Patient left: in bed;with call bell/phone within reach;with bed alarm set;with nursing/sitter in room Nurse Communication: Mobility status;Precautions PT Visit Diagnosis: Muscle weakness (generalized) (M62.81)    Time: 3295-1884 PT Time Calculation (min) (ACUTE ONLY): 21 min   Charges:   PT Evaluation $PT Eval Low Complexity: 1 Low          Malachi Pro, DPT 05/11/2020, 10:03 AM

## 2020-05-11 NOTE — ED Notes (Signed)
Report received from [place], [place]. Pt resting comfortably in ED stretcher, stretcher locked and lowest position. Pt A&Ox4 and NAD at this time. No needs expressed at this time. Denies pain. Call light within reach. Pt connected to cardiac monitor, V/S WNL. Will continue to monitor.

## 2020-05-11 NOTE — Consult Note (Signed)
Reason for Consult: Worsening R side weakness  Requesting Physician: Dr. Sherryll Burger   CC: Worsening R side weakness    HPI: Terry [name] is an 69 y.o. male with a history of HLD, HTN and mitral valve prolapse s/p mitral valve replacement on 04/12/20 who was admitted few days ago for R sided weakness. 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 He was started on eliquis. Patient had worsening of his R sided weakness which he came to the hospital back for.   Repeat MRI with evolving L MCA as posterior circulation infarcts.  He has significant intracranial stenosis.    Past Medical History:  Diagnosis Date  . Diverticulosis   . GERD (gastroesophageal reflux disease)   . Heart murmur   . Hypercholesteremia   . Hypertension   . Mitral valve prolapse     Past Surgical History:  Procedure Laterality Date  . COLONOSCOPY WITH PROPOFOL N/A 03/06/2020   Procedure: COLONOSCOPY WITH PROPOFOL;  Surgeon: Toledo, Boykin Nearing, MD;  Location: [facility] ENDOSCOPY;  Service: Gastroenterology;  Laterality: N/A;  . MITRAL VALVE REPLACEMENT    . RETINAL DETACHMENT SURGERY    . RIGHT/LEFT HEART CATH AND CORONARY ANGIOGRAPHY Bilateral 02/18/2017   Procedure: Right/Left Heart Cath and Coronary Angiography;  Surgeon: Marcina Millard, MD;  Location: [facility] INVASIVE CV LAB;  Service: Cardiovascular;  Laterality: Bilateral;  . RIGHT/LEFT HEART CATH AND CORONARY ANGIOGRAPHY N/A 02/22/2020   Procedure: RIGHT/LEFT HEART CATH AND CORONARY ANGIOGRAPHY;  Surgeon: Marcina Millard, MD;  Location: [facility] INVASIVE CV LAB;  Service: Cardiovascular;  Laterality: N/A;  . TEMPOROMANDIBULAR JOINT SURGERY      Family History  Problem Relation Age of Onset  . Varicose Veins Mother   . Cancer Father     Social History:  reports that he quit smoking about 36 years ago. He has never used smokeless tobacco. He reports current alcohol use. He reports that he does not use drugs.  Allergies  Allergen Reactions  . Penicillins Shortness  Of Breath    Did it involve swelling of the face/tongue/throat, SOB, or low BP? Yes Did it involve sudden or severe rash/hives, skin peeling, or any reaction on the inside of your mouth or nose? Unknown Did you need to seek medical attention at a hospital or doctor's office? Was at a clinic when reaction occurred When did it last happen?More than 50 years ago If all above answers are "NO", may proceed with cephalosporin use.   . Ciprofloxacin Swelling and Rash  . Ivp Dye [Iodinated Diagnostic Agents] Rash    Medications: I have reviewed the patient's current medications.  ROS: History obtained from the patient  General ROS: negative for - chills, fatigue, fever, night sweats, weight gain or weight loss Psychological ROS: negative for - behavioral disorder, hallucinations, memory difficulties, mood swings or suicidal ideation Ophthalmic ROS: negative for - blurry vision, double vision, eye pain or loss of vision ENT ROS: negative for - epistaxis, nasal discharge, oral lesions, sore throat, tinnitus or vertigo Allergy and Immunology ROS: negative for - hives or itchy/watery eyes Hematological and Lymphatic ROS: negative for - bleeding problems, bruising or swollen lymph nodes Endocrine ROS: negative for - galactorrhea, hair pattern changes, polydipsia/polyuria or temperature intolerance Respiratory ROS: negative for - cough, hemoptysis, shortness of breath or wheezing Cardiovascular ROS: negative for - chest pain, dyspnea on exertion, edema or irregular heartbeat Gastrointestinal ROS: negative for - abdominal pain, diarrhea, hematemesis, nausea/vomiting or stool incontinence Genito-Urinary ROS: negative for - dysuria, hematuria, incontinence or  urinary frequency/urgency Musculoskeletal ROS: negative for - joint swelling or muscular weakness Neurological ROS: as noted in HPI Dermatological ROS: negative for rash and skin lesion changes  Physical Examination: Blood pressure (!) 137/92,  pulse 87, temperature (!) 97.5 F (36.4 C), temperature source Oral, resp. rate 13, height 5\' 6"  (1.676 m), weight 79.8 kg, SpO2 97 %.    Neurological Examination   Mental Status: Alert, oriented, thought content appropriate.  Speech fluent without evidence of aphasia.  Able to follow 3 step commands without difficulty. Cranial Nerves: II: Discs flat bilaterally; Visual fields grossly normal, pupils equal, round, reactive to light and accommodation III,IV, VI: ptosis not present, extra-ocular motions intact bilaterally V,VII: smile symmetric, facial light touch sensation normal bilaterally VIII: hearing normal bilaterally IX,X: gag reflex present XI: bilateral shoulder shrug XII: midline tongue extension Motor: Right : Upper extremity   5/5    Left:     Upper extremity   5/5  Lower extremity   5/5     Lower extremity   5/5 Tone and bulk:normal tone throughout; no atrophy noted Sensory: Pinprick and light touch intact throughout, bilaterally Deep Tendon Reflexes: 2+ and symmetric throughout Plantars: Right: downgoing   Left: downgoing Cerebellar: normal finger-to-nose, normal rapid alternating movements and normal heel-to-shin test Gait: not tested      Laboratory Studies:   Basic Metabolic Panel: Recent Labs  Lab 05/07/20 1437 05/10/20 1754  NA 139 137  K 3.9 4.6  CL 105 103  CO2 25 26  GLUCOSE 134* 105*  BUN 13 17  CREATININE 1.37* 1.40*  CALCIUM 9.6 9.6    Liver Function Tests: Recent Labs  Lab 05/07/20 1437 05/10/20 1754  AST 21 30  ALT 22 31  ALKPHOS 74 64  BILITOT 0.7 0.7  PROT 7.5 7.5  ALBUMIN 3.9 4.2   No results for input(s): LIPASE, AMYLASE in the last 168 hours. No results for input(s): AMMONIA in the last 168 hours.  CBC: Recent Labs  Lab 05/07/20 1437 05/10/20 1754  WBC 4.7 4.9  NEUTROABS 2.5 2.6  HGB 13.7 13.1  HCT 39.2 38.6*  MCV 95.4 98.0  PLT 202 242    Cardiac Enzymes: No results for input(s): CKTOTAL, CKMB, CKMBINDEX,  TROPONINI in the last 168 hours.  BNP: Invalid input(s): POCBNP  CBG: Recent Labs  Lab 05/07/20 1438 05/10/20 1800  GLUCAP 133* 93    Microbiology: Results for orders placed or performed during the hospital encounter of 05/07/20  SARS Coronavirus 2 by RT PCR (hospital order, performed in [facility] hospital lab) Nasopharyngeal Nasopharyngeal Swab     Status: None   Collection Time: 05/07/20  4:09 PM   Specimen: Nasopharyngeal Swab  Result Value Ref Range Status   SARS Coronavirus 2 NEGATIVE NEGATIVE Final    Comment: (NOTE) SARS-CoV-2 target nucleic acids are NOT DETECTED. The SARS-CoV-2 RNA is generally detectable in upper and lower respiratory specimens during the acute phase of infection. The lowest concentration of SARS-CoV-2 viral copies this assay can detect is 250 copies / mL. A negative result does not preclude SARS-CoV-2 infection and should not be used as the sole basis for treatment or other patient management decisions.  A negative result may occur with improper specimen collection / handling, submission of specimen other than nasopharyngeal swab, presence of viral mutation(s) within the areas targeted by this assay, and inadequate number of viral copies (<250 copies / mL). A negative result must be combined with clinical observations, patient history, and epidemiological information. Fact Sheet  for Patients:   BoilerBrush.com.cy Fact Sheet for Healthcare Providers: https://pope.com/ This test is not yet approved or cleared  by the [place] FDA and has been authorized for detection and/or diagnosis of SARS-CoV-2 by FDA under an Emergency Use Authorization (EUA).  This EUA will remain in effect (meaning this test can be used) for the duration of the COVID-19 declaration under Section 564(b)(1) of the Act, 21 U.S.C. section 360bbb-3(b)(1), unless the authorization is terminated or revoked sooner. Performed at  [facility], [address] [place], [place] [address]     Coagulation Studies: Recent Labs    05/10/20 1754  LABPROT 15.6*  INR 1.3*    Urinalysis: No results for input(s): COLORURINE, LABSPEC, PHURINE, GLUCOSEU, HGBUR, BILIRUBINUR, KETONESUR, PROTEINUR, UROBILINOGEN, NITRITE, LEUKOCYTESUR in the last 168 hours.  Invalid input(s): APPERANCEUR  Lipid Panel:     Component Value Date/Time   CHOL 230 (H) 05/08/2020 0324   TRIG 142 05/08/2020 0324   HDL 30 (L) 05/08/2020 0324   CHOLHDL 7.7 05/08/2020 0324   VLDL 28 05/08/2020 0324   LDLCALC 172 (H) 05/08/2020 0324    HgbA1C:  Lab Results  Component Value Date   HGBA1C 5.4 05/08/2020    Urine Drug Screen:  No results found for: LABOPIA, COCAINSCRNUR, LABBENZ, AMPHETMU, THCU, LABBARB  Alcohol Level: No results for input(s): ETH in the last 168 hours.  Other results: EKG: A-fib.  Imaging: CT HEAD WO CONTRAST  Result Date: 05/10/2020 CLINICAL DATA:  Sudden onset weakness EXAM: CT HEAD WITHOUT CONTRAST TECHNIQUE: Contiguous axial images were obtained from the base of the skull through the vertex without intravenous contrast. COMPARISON:  MRI from 05/07/2020, CT from 05/07/2020 FINDINGS: Brain: Linear area of decreased attenuation is noted on the left involving the insula and operculum similar to that seen on recent MRI examination consistent with subacute ischemia. The other areas of recent infarct seen on prior MRI are not as well appreciated. No new focal area of ischemia is seen. No findings to suggest acute hemorrhage or space-occupying mass lesion are noted. Vascular: No hyperdense vessel or unexpected calcification. Skull: Normal. Negative for fracture or focal lesion. Sinuses/Orbits: No acute finding. Other: None. IMPRESSION: Progressive area of decreased attenuation on the left involving the operculum and insula similar to that seen on recent MRI consistent with subacute ischemia. No new focal areas of ischemia  or hemorrhage are noted. Electronically Signed   By: Alcide Clever M.D.   On: 05/10/2020 19:05   MR ANGIO HEAD WO CONTRAST  Result Date: 05/10/2020 CLINICAL DATA:  Right arm weakness. Recent stroke. EXAM: MRI HEAD WITHOUT CONTRAST MRA HEAD WITHOUT CONTRAST TECHNIQUE: Multiplanar, multiecho pulse sequences of the brain and surrounding structures were obtained without intravenous contrast. Angiographic images of the head were obtained using MRA technique without contrast. COMPARISON:  Head CT 05/10/2020 and MRI 05/07/2020 FINDINGS: MRI HEAD FINDINGS Brain: An acute to early subacute left MCA infarct involving the insula and frontal operculum has mildly enlarged from the prior MRI with greater extension superiorly in the left frontal lobe. There is persistent diffusion abnormality associated with recent punctate infarcts involving white matter in the posterior right frontal, right parietal, and right occipital lobes as well as punctate infarcts in the cerebellum bilaterally without significant enlargement. Diffusion abnormality associated with a small right parietal cortical infarct has diminished. No intracranial hemorrhage, mass, midline shift, or extra-axial fluid collection is identified. The ventricles and sulci are normal. Vascular: Major intracranial vascular flow voids are preserved. Skull and upper cervical  spine: Unremarkable bone marrow signal. Sinuses/Orbits: Right cataract extraction. Paranasal sinuses and mastoid air cells are clear. Other: None. MRA HEAD FINDINGS The visualized distal vertebral arteries are widely patent to the basilar and codominant. The right PICA and left AICA appear dominant. Patent SCAs are seen bilaterally. The basilar artery is widely patent. Posterior communicating arteries are not clearly identified and may be diminutive or absent. Both PCAs are patent without evidence of significant proximal stenosis on the left. There is a moderate to severe left PCA stenosis near the P1-P2  junction. The internal carotid arteries are widely patent from skull base to carotid termini. MCAs are patent without evidence of proximal branch occlusion or significant proximal stenosis. ACAs are patent without significant proximal stenosis on the right. There is a moderate proximal left A1 stenosis. No aneurysm is identified. IMPRESSION: 1. Mild enlargement/superior extension of an acute to early subacute left MCA infarct in the frontal lobe. 2. Evolving punctate infarcts elsewhere in the cerebrum and cerebellum as seen on the recent prior MRI. 3. No large vessel occlusion. 4. 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 Electronically Signed   By: Sebastian Ache M.D.   On: 05/10/2020 23:17   MR BRAIN WO CONTRAST  Result Date: 05/10/2020 CLINICAL DATA:  Right arm weakness. Recent stroke. EXAM: MRI HEAD WITHOUT CONTRAST MRA HEAD WITHOUT CONTRAST TECHNIQUE: Multiplanar, multiecho pulse sequences of the brain and surrounding structures were obtained without intravenous contrast. Angiographic images of the head were obtained using MRA technique without contrast. COMPARISON:  Head CT 05/10/2020 and MRI 05/07/2020 FINDINGS: MRI HEAD FINDINGS Brain: An acute to early subacute left MCA infarct involving the insula and frontal operculum has mildly enlarged from the prior MRI with greater extension superiorly in the left frontal lobe. There is persistent diffusion abnormality associated with recent punctate infarcts involving white matter in the posterior right frontal, right parietal, and right occipital lobes as well as punctate infarcts in the cerebellum bilaterally without significant enlargement. Diffusion abnormality associated with a small right parietal cortical infarct has diminished. No intracranial hemorrhage, mass, midline shift, or extra-axial fluid collection is identified. The ventricles and sulci are normal. Vascular: Major intracranial vascular flow voids are preserved.  Skull and upper cervical spine: Unremarkable bone marrow signal. Sinuses/Orbits: Right cataract extraction. Paranasal sinuses and mastoid air cells are clear. Other: None. MRA HEAD FINDINGS The visualized distal vertebral arteries are widely patent to the basilar and codominant. The right PICA and left AICA appear dominant. Patent SCAs are seen bilaterally. The basilar artery is widely patent. Posterior communicating arteries are not clearly identified and may be diminutive or absent. Both PCAs are patent without evidence of significant proximal stenosis on the left. There is a moderate to severe left PCA stenosis near the P1-P2 junction. The internal carotid arteries are widely patent from skull base to carotid termini. MCAs are patent without evidence of proximal branch occlusion or significant proximal stenosis. ACAs are patent without significant proximal stenosis on the right. There is a moderate proximal left A1 stenosis. No aneurysm is identified. IMPRESSION: 1. Mild enlargement/superior extension of an acute to early subacute left MCA infarct in the frontal lobe. 2. Evolving punctate infarcts elsewhere in the cerebrum and cerebellum as seen on the recent prior MRI. 3. No large vessel occlusion. 4. 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 Electronically Signed   By: Sebastian Ache M.D.   On: 05/10/2020 23:17     Assessment/Plan:   68  y.o. male with a history of HLD, HTN and mitral valve prolapse s/p mitral valve replacement on 04/12/20 who was admitted few days ago for R sided weakness. 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 He was started on eliquis. Patient had worsening of his R sided weakness which he came to the hospital back for.   Repeat MRI with evolving L MCA as posterior circulation infarcts.  He has significant intracranial stenosis.    - No new ischemia but evolution of current strokes - Agree with continuation of eliquis. - Will start ASA 81mg   daily as well  - pt/ot  - pt can be d/c today from ED - Neurology follow up as out patient - significant improvement in his strength since admission  05/11/2020, 10:15 AM

## 2020-05-21 ENCOUNTER — Other Ambulatory Visit: Payer: Self-pay

## 2020-05-21 ENCOUNTER — Encounter: Payer: Medicare HMO | Attending: Cardiology

## 2020-05-21 DIAGNOSIS — Z952 Presence of prosthetic heart valve: Secondary | ICD-10-CM

## 2020-05-21 NOTE — Progress Notes (Signed)
Virtual Visit completed. Patient informed on EP and RD appointment and 6 Minute walk test. Patient also informed of patient health questionnaires on My Chart. Patient Verbalizes understanding. Visit diagnosis can be found in [facility] 04/11/2020.

## 2020-05-22 ENCOUNTER — Ambulatory Visit: Payer: Medicare HMO | Attending: Internal Medicine | Admitting: Speech Pathology

## 2020-05-22 ENCOUNTER — Other Ambulatory Visit: Payer: Self-pay

## 2020-05-22 DIAGNOSIS — R4701 Aphasia: Secondary | ICD-10-CM | POA: Insufficient documentation

## 2020-05-22 DIAGNOSIS — R471 Dysarthria and anarthria: Secondary | ICD-10-CM | POA: Diagnosis present

## 2020-05-23 ENCOUNTER — Encounter: Payer: Medicare HMO | Admitting: *Deleted

## 2020-05-23 ENCOUNTER — Other Ambulatory Visit: Payer: Self-pay

## 2020-05-23 ENCOUNTER — Encounter: Payer: Self-pay | Admitting: Speech Pathology

## 2020-05-23 VITALS — Ht 69.6 in | Wt 175.0 lb

## 2020-05-23 DIAGNOSIS — Z952 Presence of prosthetic heart valve: Secondary | ICD-10-CM | POA: Diagnosis present

## 2020-05-23 NOTE — Patient Instructions (Signed)
Patient Instructions  Patient Details  Name: Terry [name] MRN: [id number] Date of Birth: [birth year] Referring Provider:  Marcina Millard, MD  Below are your personal goals for exercise, nutrition, and risk factors. Our goal is to help you stay on track towards obtaining and maintaining these goals. We will be discussing your progress on these goals with you throughout the program.  Initial Exercise Prescription: Initial Exercise Prescription - 05/23/20 1000      Date of Initial Exercise RX and Referring Provider   Date  05/23/20    Referring Provider  Paraschos, Alexander MD      Treadmill   MPH  2.3    Grade  1    Minutes  15    METs  3.08      Recumbant Bike   Level  2    RPM  50    Watts  23    Minutes  15    METs  3      NuStep   Level  3    SPM  80    Minutes  15    METs  3      Prescription Details   Frequency (times per week)  3    Duration  Progress to 30 minutes of continuous aerobic without signs/symptoms of physical distress      Intensity   THRR 40-80% of Max Heartrate  108/136    Ratings of Perceived Exertion  11-13    Perceived Dyspnea  0-4      Progression   Progression  Continue to progress workloads to maintain intensity without signs/symptoms of physical distress.      Resistance Training   Training Prescription  Yes    Weight  3 lb    Reps  10-15       Exercise Goals: Frequency: Be able to perform aerobic exercise two to three times per week in program working toward 2-5 days per week of home exercise.  Intensity: Work with a perceived exertion of 11 (fairly light) - 15 (hard) while following your exercise prescription.  We will make changes to your prescription with you as you progress through the program.   Duration: Be able to do 30 to 45 minutes of continuous aerobic exercise in addition to a 5 minute warm-up and a 5 minute cool-down routine.   Nutrition Goals: Your personal nutrition goals will be established when you do your  nutrition analysis with the dietician.  The following are general nutrition guidelines to follow: Cholesterol < 200mg /day Sodium < 1500mg /day Fiber: Men over 50 yrs - 30 grams per day  Personal Goals: Personal Goals and Risk Factors at Admission - 05/23/20 1055      Core Components/Risk Factors/Patient Goals on Admission    Weight Management  Yes;Weight Loss    Intervention  Weight Management: Develop a combined nutrition and exercise program designed to reach desired caloric intake, while maintaining appropriate intake of nutrient and fiber, sodium and fats, and appropriate energy expenditure required for the weight goal.;Weight Management: Provide education and appropriate resources to help participant work on and attain dietary goals.    Admit Weight  175 lb (79.4 kg)    Goal Weight: Short Term  170 lb (77.1 kg)    Goal Weight: Long Term  165 lb (74.8 kg)    Expected Outcomes  Short Term: Continue to assess and modify interventions until short term weight is achieved;Long Term: Adherence to nutrition and physical activity/exercise program aimed toward attainment of established  weight goal;Understanding recommendations for meals to include 15-35% energy as protein, 25-35% energy from fat, 35-60% energy from carbohydrates, less than 200mg  of dietary cholesterol, 20-35 gm of total fiber daily;Understanding of distribution of calorie intake throughout the day with the consumption of 4-5 meals/snacks;Weight Loss: Understanding of general recommendations for a balanced deficit meal plan, which promotes 1-2 lb weight loss per week and includes a negative energy balance of [phone] kcal/d    Hypertension  Yes    Intervention  Provide education on lifestyle modifcations including regular physical activity/exercise, weight management, moderate sodium restriction and increased consumption of fresh fruit, vegetables, and low fat dairy, alcohol moderation, and smoking cessation.;Monitor prescription use  compliance.    Expected Outcomes  Short Term: Continued assessment and intervention until BP is < 140/15mm HG in hypertensive participants. < 130/3mm HG in hypertensive participants with diabetes, heart failure or chronic kidney disease.;Long Term: Maintenance of blood pressure at goal levels.    Lipids  Yes    Intervention  Provide education and support for participant on nutrition & aerobic/resistive exercise along with prescribed medications to achieve LDL 70mg , HDL >40mg .    Expected Outcomes  Short Term: Participant states understanding of desired cholesterol values and is compliant with medications prescribed. Participant is following exercise prescription and nutrition guidelines.;Long Term: Cholesterol controlled with medications as prescribed, with individualized exercise RX and with personalized nutrition plan. Value goals: LDL < 70mg , HDL > 40 mg.       Tobacco Use Initial Evaluation: Social History   Tobacco Use  Smoking Status Former Smoker  . Packs/day: 1.50  . Years: 20.00  . Pack years: 30.00  . Types: Cigarettes  . Quit date: 88  . Years since quitting: 36.4  Smokeless Tobacco Never Used    Exercise Goals and Review: Exercise Goals    Row Name 05/23/20 1052             Exercise Goals   Increase Physical Activity  Yes       Intervention  Provide advice, education, support and counseling about physical activity/exercise needs.;Develop an individualized exercise prescription for aerobic and resistive training based on initial evaluation findings, risk stratification, comorbidities and participant's personal goals.       Expected Outcomes  Short Term: Attend rehab on a regular basis to increase amount of physical activity.;Long Term: Add in home exercise to make exercise part of routine and to increase amount of physical activity.;Long Term: Exercising regularly at least 3-5 days a week.       Increase Strength and Stamina  Yes       Intervention  Provide advice,  education, support and counseling about physical activity/exercise needs.;Develop an individualized exercise prescription for aerobic and resistive training based on initial evaluation findings, risk stratification, comorbidities and participant's personal goals.       Expected Outcomes  Short Term: Increase workloads from initial exercise prescription for resistance, speed, and METs.;Short Term: Perform resistance training exercises routinely during rehab and add in resistance training at home;Long Term: Improve cardiorespiratory fitness, muscular endurance and strength as measured by increased METs and functional capacity (6MWT)       Able to understand and use rate of perceived exertion (RPE) scale  Yes       Intervention  Provide education and explanation on how to use RPE scale       Expected Outcomes  Short Term: Able to use RPE daily in rehab to express subjective intensity level;Long Term:  Able to use RPE to  guide intensity level when exercising independently       Able to understand and use Dyspnea scale  Yes       Intervention  Provide education and explanation on how to use Dyspnea scale       Expected Outcomes  Short Term: Able to use Dyspnea scale daily in rehab to express subjective sense of shortness of breath during exertion;Long Term: Able to use Dyspnea scale to guide intensity level when exercising independently       Knowledge and understanding of Target Heart Rate Range (THRR)  Yes       Intervention  Provide education and explanation of THRR including how the numbers were predicted and where they are located for reference       Expected Outcomes  Short Term: Able to state/look up THRR;Short Term: Able to use daily as guideline for intensity in rehab;Long Term: Able to use THRR to govern intensity when exercising independently       Able to check pulse independently  Yes       Intervention  Provide education and demonstration on how to check pulse in carotid and radial  arteries.;Review the importance of being able to check your own pulse for safety during independent exercise       Expected Outcomes  Short Term: Able to explain why pulse checking is important during independent exercise;Long Term: Able to check pulse independently and accurately       Understanding of Exercise Prescription  Yes       Intervention  Provide education, explanation, and written materials on patient's individual exercise prescription       Expected Outcomes  Short Term: Able to explain program exercise prescription;Long Term: Able to explain home exercise prescription to exercise independently          Copy of goals given to participant.

## 2020-05-23 NOTE — Therapy (Signed)
[facility] MAIN [facility] SERVICES [address] [place], [place], [address] Phone: [phone]   Fax:  [phone]  Speech Language Pathology Evaluation  Patient Details  Name: Terry [name] MRN: [id number] Date of Birth: 1951-05-15 Referring Provider (SLP): Dr. Laural Benes   Encounter Date: 05/22/2020  End of Session - 05/23/20 1416    Visit Number  1    Number of Visits  14    Date for SLP Re-Evaluation  07/19/20    Authorization Type  Medicare    Authorization Time Period  Start 05/22/2020    Authorization - Visit Number  1    Authorization - Number of Visits  10    SLP Start Time  1400    SLP Stop Time   1445    SLP Time Calculation (min)  45 min    Activity Tolerance  Patient tolerated treatment well       Past Medical History:  Diagnosis Date  . Diverticulosis   . GERD (gastroesophageal reflux disease)   . Heart murmur   . Hypercholesteremia   . Hypertension   . Mitral valve prolapse     Past Surgical History:  Procedure Laterality Date  . COLONOSCOPY WITH PROPOFOL N/A 03/06/2020   Procedure: COLONOSCOPY WITH PROPOFOL;  Surgeon: Toledo, Boykin Nearing, MD;  Location: [facility] ENDOSCOPY;  Service: Gastroenterology;  Laterality: N/A;  . MITRAL VALVE REPLACEMENT    . RETINAL DETACHMENT SURGERY    . RIGHT/LEFT HEART CATH AND CORONARY ANGIOGRAPHY Bilateral 02/18/2017   Procedure: Right/Left Heart Cath and Coronary Angiography;  Surgeon: Marcina Millard, MD;  Location: [facility] INVASIVE CV LAB;  Service: Cardiovascular;  Laterality: Bilateral;  . RIGHT/LEFT HEART CATH AND CORONARY ANGIOGRAPHY N/A 02/22/2020   Procedure: RIGHT/LEFT HEART CATH AND CORONARY ANGIOGRAPHY;  Surgeon: Marcina Millard, MD;  Location: [facility] INVASIVE CV LAB;  Service: Cardiovascular;  Laterality: N/A;  . TEMPOROMANDIBULAR JOINT SURGERY      There were no vitals filed for this visit.      SLP Evaluation [facility] - 05/23/20 1402      SLP Visit Information   SLP  Received On  05/22/20    Referring Provider (SLP)  Dr. Laural Benes    Onset Date  05/09/2020    Medical Diagnosis  CVA      Subjective   Subjective  The patient is eager to receive speech therapy    Patient/Family Stated Goal  Improved speech intelligibility, improved word finding, and improved saliva management      Pain Assessment   Currently in Pain?  No/denies      General Information   HPI  Terry [name] is a 69 year old man S/P CVA 05/09/2020.  MRI 05/10/2020: IMPRESSION: 1. Mild enlargement/superior extension of an acute to early subacute left MCA infarct in the frontal lobe. 2. Evolving punctate infarcts elsewhere in the cerebrum and cerebellum as seen on the recent prior MRI. 3. No large vessel occlusion. 4. Severe proximal left PCA stenosis and moderate proximal left ACA      Prior Functional Status   Cognitive/Linguistic Baseline  Within functional limits      Cognition   Overall Cognitive Status  Within Functional Limits for tasks assessed      Auditory Comprehension   Overall Auditory Comprehension  Appears within functional limits for tasks assessed      Verbal Expression   Overall Verbal Expression  Impaired      Oral Motor/Sensory Function   Overall Oral Motor/Sensory Function  Impaired  Labial Coordination  Reduced    Lingual Coordination  Reduced    Overall Oral Motor/Sensory Function  Mild dysarthria      Motor Speech   Overall Motor Speech  Impaired    Respiration  Within functional limits    Phonation  Normal    Resonance  Within functional limits    Articulation  Impaired    Level of Impairment  Conversation    Intelligibility  Intelligibility reduced      Standardized Assessments   Standardized Assessments   Other Assessment;Western Aphasia Battery revised   Oral Motor Motor Speech       Motor Speech Evaluation Lips: ROM and strength within normal limits, mildly decreased planning/coordination for rapid alternating movements.  Tongue: ROM and strength  within normal limits, mildly decreased planning/coordination for rapid alternating movements.  Jaw: Within normal limits; observe some tension. Soft palate: Minimal asymmetry Oral agility: mildly decreased planning/coordination for rapid alternating movements.  Sensory: Within normal limits Voice: Mild hypophonia Respiration:  Within normal limits Resonance:  Within normal limits Intelligibility: Reduced intelligibility due to imprecise articulation when speaking fast.     Western Aphasia Battery- Screening  Spontaneous Speech      Information content   9/10       Fluency    9/10     Comprehension     Yes/No questions   10/10          Sequential Commands  9/10     Repetition    10/10     Naming    Object Naming   9.5/10        Screening Aphasia Quotient 95/100    SLP Education - 05/23/20 1415    Education Details  Results, recommendations, POC    Person(s) Educated  Patient    Methods  Explanation    Comprehension  Verbalized understanding         SLP Long Term Goals - 05/23/20 1419      SLP LONG TERM GOAL #1   Title  Pt will improve speech intelligibility for sentences and multi-syllabic words by controlling rate of speech, over-articulation, and increased loudness to achieve 80% intelligibility with min. SLP cues.    Time  8    Period  Weeks    Status  New    Target Date  07/19/20      SLP LONG TERM GOAL #2   Title  Patient will complete high level word finding tasks with 80% accuracy.    Time  8    Period  Weeks    Status  New    Target Date  07/19/20      SLP LONG TERM GOAL #3   Title  Patient will generate grammatical, fluent, and cogent sentences to complete abstract/complex linguistic task with 80% accuracy.    Time  8    Period  Weeks    Status  New    Target Date  07/19/20       Plan - 05/23/20 1418    Clinical Impression Statement  At 2 weeks post onset CVA, the patient is presenting with mild dysarthria of speech (imprecise articulation at rapid  rate of speech, excessive saliva), mild word finding (use of vague terms resulting in reduced information content of spontaneous speech).    The patient would benefit from skilled speech therapy for restorative and compensatory treatment of dysarthria and word finding.    Speech Therapy Frequency  2x / week    Duration  Other (comment)  8 weeks   Treatment/Interventions  Language facilitation;SLP instruction and feedback;Patient/family education    Potential to Achieve Goals  Good    Potential Considerations  Ability to learn/carryover information;Previous level of function;Co-morbidities;Severity of impairments;Cooperation/participation level;Medical prognosis;Family/community support    Consulted and Agree with Plan of Care  Patient       Patient will benefit from skilled therapeutic intervention in order to improve the following deficits and impairments:   Dysarthria and anarthria - Plan: SLP plan of care cert/re-cert  Aphasia - Plan: SLP plan of care cert/re-cert    Problem List Patient Active Problem List   Diagnosis Date Noted  . Chronic anticoagulation 05/11/2020  . Acute CVA (cerebrovascular accident) (Start) 05/08/2020  . Hypercholesteremia   . Hypertension   . Stroke ([place])   . History of mitral valve replacement   . CKD (chronic kidney disease), stage III   . Leg pain 09/27/2017  . 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 Venous insufficiency 09/27/2017  . GERD (gastroesophageal reflux disease) 09/27/2017   Terry Sea, MS/CCC- SLP  Terry [name] 05/23/2020, 2:25 PM  [place] MAIN [facility] SERVICES [address] [place], [place], [address] Phone: [phone]   Fax:  [phone]  Name: Terry [name] MRN: [id number] Date of Birth: 06-30-1951

## 2020-05-23 NOTE — Progress Notes (Signed)
Cardiac Individual Treatment Plan  Patient Details  Name: Terry [name] MRN: [id number] Date of Birth: 02-Sep-1951 Referring Provider:     Cardiac Rehab from 05/23/2020 in [facility] Cardiac and Pulmonary Rehab  Referring Provider  Isaias Cowman MD      Initial Encounter Date:    Cardiac Rehab from 05/23/2020 in [facility] Cardiac and Pulmonary Rehab  Date  05/23/20      Visit Diagnosis: S/P mitral valve replacement  Patient's Home Medications on Admission:  Current Outpatient Medications:  .  amiodarone (PACERONE) 200 MG tablet, Take 1 tablet (200 mg total) by mouth daily., Disp: , Rfl:  .  apixaban (ELIQUIS) 5 MG TABS tablet, Take 1 tablet (5 mg total) by mouth 2 (two) times daily., Disp: 60 tablet, Rfl: 0 .  aspirin EC 81 MG EC tablet, Take 1 tablet (81 mg total) by mouth daily., Disp: 30 tablet, Rfl: 0 .  atorvastatin (LIPITOR) 40 MG tablet, Take 1 tablet (40 mg total) by mouth daily., Disp: 30 tablet, Rfl: 0 .  clobetasol ointment (TEMOVATE) 8.45 %, Apply 1 application topically daily as needed (skin irritation). , Disp: , Rfl:  .  Multiple Vitamin (MULTIVITAMIN WITH MINERALS) TABS tablet, Take 1 tablet by mouth at bedtime., Disp: , Rfl:  .  omeprazole (PRILOSEC) 20 MG capsule, Take 20 mg by mouth every evening. , Disp: , Rfl:  .  ondansetron (ZOFRAN) 4 MG tablet, Take 4 mg by mouth every 8 (eight) hours as needed., Disp: , Rfl:  .  triamcinolone cream (KENALOG) 0.1 %, Apply 1 application topically daily as needed (skin irritation.). , Disp: , Rfl:  .  valACYclovir (VALTREX) 500 MG tablet, Take 500 mg by mouth every evening. , Disp: , Rfl:   Past Medical History: Past Medical History:  Diagnosis Date  . Diverticulosis   . GERD (gastroesophageal reflux disease)   . Heart murmur   . Hypercholesteremia   . Hypertension   . Mitral valve prolapse     Tobacco Use: Social History   Tobacco Use  Smoking Status Former Smoker  . Packs/day: 1.50  . Years: 20.00  . Pack years:  30.00  . Types: Cigarettes  . Quit date: 41  . Years since quitting: 36.4  Smokeless Tobacco Never Used    Labs: Recent Review [facility] for [facility]P Cardiac and Pulmonary Rehab Latest Ref Rng & Units 05/08/2020   Cholestrol 0 - 200 mg/dL 230(H)   LDLCALC 0 - 99 mg/dL 172(H)   HDL >40 mg/dL 30(L)   Trlycerides <150 mg/dL 142   Hemoglobin A1c 4.8 - 5.6 % 5.4       Exercise Target Goals: Exercise Program Goal: Individual exercise prescription set using results from initial 6 min walk test and THRR while considering  patient's activity barriers and safety.   Exercise Prescription Goal: Initial exercise prescription builds to 30-45 minutes a day of aerobic activity, 2-3 days per week.  Home exercise guidelines will be given to patient during program as part of exercise prescription that the participant will acknowledge.   Education: Aerobic Exercise & Resistance Training: - Gives group verbal and written instruction on the various components of exercise. Focuses on aerobic and resistive training programs and the benefits of this training and how to safely progress through these programs..   Education: Exercise & Equipment Safety: - Individual verbal instruction and demonstration of equipment use and safety with use of the equipment.   Cardiac Rehab from 05/23/2020 in [facility] Cardiac and  Pulmonary Rehab  Date  05/23/20  Educator  [facility]  Instruction Review Code  1- Verbalizes Understanding      Education: Exercise Physiology & General Exercise Guidelines: - Group verbal and written instruction with models to review the exercise physiology of the cardiovascular system and associated critical values. Provides general exercise guidelines with specific guidelines to those with heart or lung disease.    Education: Flexibility, Balance, Mind/Body Relaxation: Provides group verbal/written instruction on the benefits of flexibility and balance training, including mind/body exercise  modes such as yoga, pilates and tai chi.  Demonstration and skill practice provided.   Activity Barriers & Risk Stratification: Activity Barriers & Cardiac Risk Stratification - 05/23/20 1046      Activity Barriers & Cardiac Risk Stratification   Activity Barriers  Deconditioning;Muscular Weakness;Other (comment);Joint Problems;Balance Concerns;Incisional Pain    Comments  recent CVA, minimal residuals, R rotator cuff tear    Cardiac Risk Stratification  High       6 Minute Walk: 6 Minute Walk    Row Name 05/23/20 1044         6 Minute Walk   Phase  Initial     Distance  1215 feet     Walk Time  6 minutes     # of Rest Breaks  0     MPH  2.3     METS  2.93     RPE  7     VO2 Peak  10.25     Symptoms  No     Resting HR  80 bpm     Resting BP  126/64     Resting Oxygen Saturation   97 %     Exercise Oxygen Saturation  during 6 min walk  99 %     Max Ex. HR  98 bpm     Max Ex. BP  134/70     2 Minute Post BP  110/56        Oxygen Initial Assessment:   Oxygen Re-Evaluation:   Oxygen Discharge (Final Oxygen Re-Evaluation):   Initial Exercise Prescription: Initial Exercise Prescription - 05/23/20 1000      Date of Initial Exercise RX and Referring Provider   Date  05/23/20    Referring Provider  Paraschos, Alexander MD      Treadmill   MPH  2.3    Grade  1    Minutes  15    METs  3.08      Recumbant Bike   Level  2    RPM  50    Watts  23    Minutes  15    METs  3      NuStep   Level  3    SPM  80    Minutes  15    METs  3      Prescription Details   Frequency (times per week)  3    Duration  Progress to 30 minutes of continuous aerobic without signs/symptoms of physical distress      Intensity   THRR 40-80% of Max Heartrate  108/136    Ratings of Perceived Exertion  11-13    Perceived Dyspnea  0-4      Progression   Progression  Continue to progress workloads to maintain intensity without signs/symptoms of physical distress.       Resistance Training   Training Prescription  Yes    Weight  3 lb    Reps  10-15  Perform Capillary Blood Glucose checks as needed.  Exercise Prescription Changes: Exercise Prescription Changes    Row Name 05/23/20 1000             Response to Exercise   Blood Pressure (Admit)  126/64       Blood Pressure (Exercise)  134/70       Blood Pressure (Exit)  110/56       Heart Rate (Admit)  80 bpm       Heart Rate (Exercise)  98 bpm       Heart Rate (Exit)  88 bpm       Oxygen Saturation (Admit)  97 %       Oxygen Saturation (Exercise)  99 %       Rating of Perceived Exertion (Exercise)  7       Symptoms  none       Comments  walk test results          Exercise Comments:   Exercise Goals and Review: Exercise Goals    Row Name 05/23/20 1052             Exercise Goals   Increase Physical Activity  Yes       Intervention  Provide advice, education, support and counseling about physical activity/exercise needs.;Develop an individualized exercise prescription for aerobic and resistive training based on initial evaluation findings, risk stratification, comorbidities and participant's personal goals.       Expected Outcomes  Short Term: Attend rehab on a regular basis to increase amount of physical activity.;Long Term: Add in home exercise to make exercise part of routine and to increase amount of physical activity.;Long Term: Exercising regularly at least 3-5 days a week.       Increase Strength and Stamina  Yes       Intervention  Provide advice, education, support and counseling about physical activity/exercise needs.;Develop an individualized exercise prescription for aerobic and resistive training based on initial evaluation findings, risk stratification, comorbidities and participant's personal goals.       Expected Outcomes  Short Term: Increase workloads from initial exercise prescription for resistance, speed, and METs.;Short Term: Perform resistance training  exercises routinely during rehab and add in resistance training at home;Long Term: Improve cardiorespiratory fitness, muscular endurance and strength as measured by increased METs and functional capacity (6MWT)       Able to understand and use rate of perceived exertion (RPE) scale  Yes       Intervention  Provide education and explanation on how to use RPE scale       Expected Outcomes  Short Term: Able to use RPE daily in rehab to express subjective intensity level;Long Term:  Able to use RPE to guide intensity level when exercising independently       Able to understand and use Dyspnea scale  Yes       Intervention  Provide education and explanation on how to use Dyspnea scale       Expected Outcomes  Short Term: Able to use Dyspnea scale daily in rehab to express subjective sense of shortness of breath during exertion;Long Term: Able to use Dyspnea scale to guide intensity level when exercising independently       Knowledge and understanding of Target Heart Rate Range (THRR)  Yes       Intervention  Provide education and explanation of THRR including how the numbers were predicted and where they are located for reference       Expected Outcomes  Short Term: Able to state/look up THRR;Short Term: Able to use daily as guideline for intensity in rehab;Long Term: Able to use THRR to govern intensity when exercising independently       Able to check pulse independently  Yes       Intervention  Provide education and demonstration on how to check pulse in carotid and radial arteries.;Review the importance of being able to check your own pulse for safety during independent exercise       Expected Outcomes  Short Term: Able to explain why pulse checking is important during independent exercise;Long Term: Able to check pulse independently and accurately       Understanding of Exercise Prescription  Yes       Intervention  Provide education, explanation, and written materials on patient's individual exercise  prescription       Expected Outcomes  Short Term: Able to explain program exercise prescription;Long Term: Able to explain home exercise prescription to exercise independently          Exercise Goals Re-Evaluation :   Discharge Exercise Prescription (Final Exercise Prescription Changes): Exercise Prescription Changes - 05/23/20 1000      Response to Exercise   Blood Pressure (Admit)  126/64    Blood Pressure (Exercise)  134/70    Blood Pressure (Exit)  110/56    Heart Rate (Admit)  80 bpm    Heart Rate (Exercise)  98 bpm    Heart Rate (Exit)  88 bpm    Oxygen Saturation (Admit)  97 %    Oxygen Saturation (Exercise)  99 %    Rating of Perceived Exertion (Exercise)  7    Symptoms  none    Comments  walk test results       Nutrition:  Target Goals: Understanding of nutrition guidelines, daily intake of sodium <1563m, cholesterol <2028m calories 30% from fat and 7% or less from saturated fats, daily to have 5 or more servings of fruits and vegetables.  Education: Controlling Sodium/Reading Food Labels -Group verbal and written material supporting the discussion of sodium use in heart healthy nutrition. Review and explanation with models, verbal and written materials for utilization of the food label.   Education: General Nutrition Guidelines/Fats and Fiber: -Group instruction provided by verbal, written material, models and posters to present the general guidelines for heart healthy nutrition. Gives an explanation and review of dietary fats and fiber.   Biometrics: Pre Biometrics - 05/23/20 1052      Pre Biometrics   Height  5' 9.6" (1.768 m)    Weight  175 lb (79.4 kg)    BMI (Calculated)  25.39    Single Leg Stand  3.28 seconds        Nutrition Therapy Plan and Nutrition Goals:   Nutrition Assessments: Nutrition Assessments - 05/23/20 1054      MEDFICTS Scores   Pre Score  93       MEDIFICTS Score Key:          ?70 Need to make dietary changes           40-70 Heart Healthy Diet         ? 40 Therapeutic Level Cholesterol Diet  Nutrition Goals Re-Evaluation:   Nutrition Goals Discharge (Final Nutrition Goals Re-Evaluation):   Psychosocial: Target Goals: Acknowledge presence or absence of significant depression and/or stress, maximize coping skills, provide positive support system. Participant is able to verbalize types and ability to use techniques and skills needed for reducing stress and depression.  Education: Depression - Provides group verbal and written instruction on the correlation between heart/lung disease and depressed mood, treatment options, and the stigmas associated with seeking treatment.   Education: Sleep Hygiene -Provides group verbal and written instruction about how sleep can affect your health.  Define sleep hygiene, discuss sleep cycles and impact of sleep habits. Review good sleep hygiene tips.     Education: Stress and Anxiety: - Provides group verbal and written instruction about the health risks of elevated stress and causes of high stress.  Discuss the correlation between heart/lung disease and anxiety and treatment options. Review healthy ways to manage with stress and anxiety.    Initial Review & Psychosocial Screening: Initial Psych Review & Screening - 05/21/20 1254      Initial Review   Current issues with  None Identified;Current Stress Concerns    Source of Stress Concerns  Chronic Illness    Comments  He has had two strokes within a week. He can look to his wife and son for support. He has a positive outlook on his health currenty.      Family Dynamics   Good Support System?  Yes      Barriers   Psychosocial barriers to participate in program  There are no identifiable barriers or psychosocial needs.;The patient should benefit from training in stress management and relaxation.;Psychosocial barriers identified (see note)      Screening Interventions   Interventions  To provide support and  resources with identified psychosocial needs;Provide feedback about the scores to participant;Encouraged to exercise    Expected Outcomes  Short Term goal: Utilizing psychosocial counselor, staff and physician to assist with identification of specific Stressors or current issues interfering with healing process. Setting desired goal for each stressor or current issue identified.;Long Term Goal: Stressors or current issues are controlled or eliminated.;Short Term goal: Identification and review with participant of any Quality of Life or Depression concerns found by scoring the questionnaire.;Long Term goal: The participant improves quality of Life and PHQ9 Scores as seen by post scores and/or verbalization of changes       Quality of Life Scores:  Quality of Life - 05/23/20 1054      Quality of Life   Select  Quality of Life      Quality of Life Scores   Health/Function Pre  7.4 %    Socioeconomic Pre  14.79 %    Psych/Spiritual Pre  13.79 %    Family Pre  14.4 %    GLOBAL Pre  11.26 %      Scores of 19 and below usually indicate a poorer quality of life in these areas.  A difference of  2-3 points is a clinically meaningful difference.  A difference of 2-3 points in the total score of the Quality of Life Index has been associated with significant improvement in overall quality of life, self-image, physical symptoms, and general health in studies assessing change in quality of life.  PHQ-9: Recent Review Flowsheet Data    Depression screen [facility] 2/9 05/23/2020   Decreased Interest 2   Down, Depressed, Hopeless 1   PHQ - 2 Score 3   Altered sleeping 1   Tired, decreased energy 1   Change in appetite 2   Feeling bad or failure about yourself  1   Trouble concentrating 1   Moving slowly or fidgety/restless 2   Suicidal thoughts 0   PHQ-9 Score 11   Difficult doing work/chores Somewhat difficult     Interpretation  of Total Score  Total Score Depression Severity:  1-4 = Minimal  depression, 5-9 = Mild depression, 10-14 = Moderate depression, 15-19 = Moderately severe depression, 20-27 = Severe depression   Psychosocial Evaluation and Intervention: Psychosocial Evaluation - 05/21/20 1306      Psychosocial Evaluation & Interventions   Interventions  Encouraged to exercise with the program and follow exercise prescription    Comments  He has had two strokes within a week. He can look to his wife and son for support. He has a positive outlook on his health currenty.    Expected Outcomes  Short: Start Heart Track Long: Continue to attend Rehab to help with patients mental state.    Continue Psychosocial Services   Follow up required by staff       Psychosocial Re-Evaluation:   Psychosocial Discharge (Final Psychosocial Re-Evaluation):   Vocational Rehabilitation: Provide vocational rehab assistance to qualifying candidates.   Vocational Rehab Evaluation & Intervention:   Education: Education Goals: Education classes will be provided on a variety of topics geared toward better understanding of heart health and risk factor modification. Participant will state understanding/return demonstration of topics presented as noted by education test scores.  Learning Barriers/Preferences: Learning Barriers/Preferences - 05/21/20 1258      Learning Barriers/Preferences   Learning Barriers  None    Learning Preferences  None       General Cardiac Education Topics:  AED/CPR: - Group verbal and written instruction with the use of models to demonstrate the basic use of the AED with the basic ABC's of resuscitation.   Anatomy & Physiology of the Heart: - Group verbal and written instruction and models provide basic cardiac anatomy and physiology, with the coronary electrical and arterial systems. Review of Valvular disease and Heart Failure   Cardiac Procedures: - Group verbal and written instruction to review commonly prescribed medications for heart disease.  Reviews the medication, class of the drug, and side effects. Includes the steps to properly store meds and maintain the prescription regimen. (beta blockers and nitrates)   Cardiac Medications I: - Group verbal and written instruction to review commonly prescribed medications for heart disease. Reviews the medication, class of the drug, and side effects. Includes the steps to properly store meds and maintain the prescription regimen.   Cardiac Medications II: -Group verbal and written instruction to review commonly prescribed medications for heart disease. Reviews the medication, class of the drug, and side effects. (all other drug classes)    Go Sex-Intimacy & Heart Disease, Get SMART - Goal Setting: - Group verbal and written instruction through game format to discuss heart disease and the return to sexual intimacy. Provides group verbal and written material to discuss and apply goal setting through the application of the S.M.A.R.T. Method.   Other Matters of the Heart: - Provides group verbal, written materials and models to describe Stable Angina and Peripheral Artery. Includes description of the disease process and treatment options available to the cardiac patient.   Infection Prevention: - Provides verbal and written material to individual with discussion of infection control including proper hand washing and proper equipment cleaning during exercise session.   Cardiac Rehab from 05/23/2020 in [facility] Cardiac and Pulmonary Rehab  Date  05/23/20  Educator  [facility]  Instruction Review Code  1- Verbalizes Understanding      Falls Prevention: - Provides verbal and written material to individual with discussion of falls prevention and safety.   Cardiac Rehab from 05/23/2020 in [facility] Cardiac and Pulmonary Rehab  Date  05/23/20  Educator  [facility]  Instruction Review Code  1- Verbalizes Understanding      Other: -Provides group and verbal instruction on various topics (see  comments)   Knowledge Questionnaire Score: Knowledge Questionnaire Score - 05/23/20 1054      Knowledge Questionnaire Score   Pre Score  25/26 Education Focus: Nutrition       Core Components/Risk Factors/Patient Goals at Admission: Personal Goals and Risk Factors at Admission - 05/23/20 1055      Core Components/Risk Factors/Patient Goals on Admission    Weight Management  Yes;Weight Loss    Intervention  Weight Management: Develop a combined nutrition and exercise program designed to reach desired caloric intake, while maintaining appropriate intake of nutrient and fiber, sodium and fats, and appropriate energy expenditure required for the weight goal.;Weight Management: Provide education and appropriate resources to help participant work on and attain dietary goals.    Admit Weight  175 lb (79.4 kg)    Goal Weight: Short Term  170 lb (77.1 kg)    Goal Weight: Long Term  165 lb (74.8 kg)    Expected Outcomes  Short Term: Continue to assess and modify interventions until short term weight is achieved;Long Term: Adherence to nutrition and physical activity/exercise program aimed toward attainment of established weight goal;Understanding recommendations for meals to include 15-35% energy as protein, 25-35% energy from fat, 35-60% energy from carbohydrates, less than 212m of dietary cholesterol, 20-35 gm of total fiber daily;Understanding of distribution of calorie intake throughout the day with the consumption of 4-5 meals/snacks;Weight Loss: Understanding of general recommendations for a balanced deficit meal plan, which promotes 1-2 lb weight loss per week and includes a negative energy balance of [phone] kcal/d    Hypertension  Yes    Intervention  Provide education on lifestyle modifcations including regular physical activity/exercise, weight management, moderate sodium restriction and increased consumption of fresh fruit, vegetables, and low fat dairy, alcohol moderation, and smoking  cessation.;Monitor prescription use compliance.    Expected Outcomes  Short Term: Continued assessment and intervention until BP is < 140/963mHG in hypertensive participants. < 130/8012mG in hypertensive participants with diabetes, heart failure or chronic kidney disease.;Long Term: Maintenance of blood pressure at goal levels.    Lipids  Yes    Intervention  Provide education and support for participant on nutrition & aerobic/resistive exercise along with prescribed medications to achieve LDL <11m48mDL >40mg89m Expected Outcomes  Short Term: Participant states understanding of desired cholesterol values and is compliant with medications prescribed. Participant is following exercise prescription and nutrition guidelines.;Long Term: Cholesterol controlled with medications as prescribed, with individualized exercise RX and with personalized nutrition plan. Value goals: LDL < 11mg,25m > 40 mg.       Education:Diabetes - Individual verbal and written instruction to review signs/symptoms of diabetes, desired ranges of glucose level fasting, after meals and with exercise. Acknowledge that pre and post exercise glucose checks will be done for 3 sessions at entry of program.   Education: Know Your Numbers and Risk Factors: -Group verbal and written instruction about important numbers in your health.  Discussion of what are risk factors and how they play a role in the disease process.  Review of Cholesterol, Blood Pressure, Diabetes, and BMI and the role they play in your overall health.   Core Components/Risk Factors/Patient Goals Review:    Core Components/Risk Factors/Patient Goals at Discharge (Final Review):    ITP Comments: ITP Comments  [place] Name 05/21/20 1303 05/23/20 1044         ITP Comments  Virtual Visit completed. Patient informed on EP and RD appointment and 6 Minute walk test. Patient also informed of patient health questionnaires on My Chart. Patient Verbalizes understanding.  Visit diagnosis can be found in [facility] 04/11/2020.  Completed 6MWT and gym orientation.  Initial ITP created and sent for review to Dr. Emily Filbert, Medical Director.         Comments: Initial ITP

## 2020-05-23 NOTE — Progress Notes (Signed)
Opened in error

## 2020-05-29 ENCOUNTER — Encounter: Payer: Medicare HMO | Attending: Cardiology | Admitting: *Deleted

## 2020-05-29 ENCOUNTER — Encounter: Payer: Self-pay | Admitting: Speech Pathology

## 2020-05-29 ENCOUNTER — Ambulatory Visit: Payer: Medicare HMO | Attending: Internal Medicine | Admitting: Speech Pathology

## 2020-05-29 ENCOUNTER — Other Ambulatory Visit: Payer: Self-pay

## 2020-05-29 DIAGNOSIS — Z952 Presence of prosthetic heart valve: Secondary | ICD-10-CM | POA: Insufficient documentation

## 2020-05-29 DIAGNOSIS — R4701 Aphasia: Secondary | ICD-10-CM | POA: Diagnosis present

## 2020-05-29 DIAGNOSIS — I69851 Hemiplegia and hemiparesis following other cerebrovascular disease affecting right dominant side: Secondary | ICD-10-CM | POA: Insufficient documentation

## 2020-05-29 DIAGNOSIS — R471 Dysarthria and anarthria: Secondary | ICD-10-CM

## 2020-05-29 NOTE — Therapy (Signed)
[place] MAIN [facility] SERVICES [address] [place], [place], [address] Phone: [phone]   Fax:  [phone]  Speech Language Pathology Treatment  Patient Details  Name: Terry [name] MRN: [id number] Date of Birth: [birth year] Referring Provider (SLP): Dr. Wynetta Emery   Encounter Date: 05/29/2020  End of Session - 05/29/20 1517    Visit Number  2    Number of Visits  14    Date for SLP Re-Evaluation  07/19/20    Authorization Type  Medicare    Authorization Time Period  Start 05/22/2020    Authorization - Visit Number  2    Authorization - Number of Visits  10    SLP Start Time  0200    SLP Stop Time   0245    SLP Time Calculation (min)  45 min    Activity Tolerance  Patient tolerated treatment well       Past Medical History:  Diagnosis Date   Diverticulosis    GERD (gastroesophageal reflux disease)    Heart murmur    Hypercholesteremia    Hypertension    Mitral valve prolapse     Past Surgical History:  Procedure Laterality Date   COLONOSCOPY WITH PROPOFOL N/A 03/06/2020   Procedure: COLONOSCOPY WITH PROPOFOL;  Surgeon: Toledo, Benay Pike, MD;  Location: [facility] ENDOSCOPY;  Service: Gastroenterology;  Laterality: N/A;   MITRAL VALVE REPLACEMENT     RETINAL DETACHMENT SURGERY     RIGHT/LEFT HEART CATH AND CORONARY ANGIOGRAPHY Bilateral 02/18/2017   Procedure: Right/Left Heart Cath and Coronary Angiography;  Surgeon: Isaias Cowman, MD;  Location: [place] [facility];  Service: Cardiovascular;  Laterality: Bilateral;   RIGHT/LEFT HEART CATH AND CORONARY ANGIOGRAPHY N/A 02/22/2020   Procedure: RIGHT/LEFT HEART CATH AND CORONARY ANGIOGRAPHY;  Surgeon: Isaias Cowman, MD;  Location: [place] [facility];  Service: Cardiovascular;  Laterality: N/A;   TEMPOROMANDIBULAR JOINT SURGERY      There were no vitals filed for this visit.  Subjective Assessment - 05/29/20 1516    Subjective  Things are jumbled up in my head.  Sometimes what people think Im saying is not really what I mean.            ADULT SLP TREATMENT - 05/29/20 0001      General Information   Behavior/Cognition  Alert;Cooperative;Pleasant mood      Treatment Provided   Treatment provided  Cognitive-Linquistic      Pain Assessment   Pain Assessment  No/denies pain      Cognitive-Linquistic Treatment   Treatment focused on  Cognition    Skilled Treatment  EXPRESSION: Completed synonym activity; came up with similar word in 95% of trials; increased to 100% with semantic cueing. Completed antonym activity at 100% independence. Noted word finding difficulty a couple times, but came up with an answer after thinking about it. Named categories given a list of items (f=3) at 95% accuracy independently. Answered general information questions with a verbal prompt. Came up with cogent and grammatically correct sentences with 85% accuracy. Rate of speech was fast.      Assessment / Recommendations / Plan   Plan  Continue with current plan of care      Progression Toward Goals   Progression toward goals  Progressing toward goals       SLP Education - 05/29/20 1516    Education Details  Slow rate of speech, think before speaking    Person(s) Educated  Patient    Comprehension  Verbalized  understanding         SLP Long Term Goals - 05/23/20 1419      SLP LONG TERM GOAL #1   Title  Pt will improve speech intelligibility for sentences and multi-syllabic words by controlling rate of speech, over-articulation, and increased loudness to achieve 80% intelligibility with min. SLP cues.    Time  8    Period  Weeks    Status  New    Target Date  07/19/20      SLP LONG TERM GOAL #2   Title  Patient will complete high level word finding tasks with 80% accuracy.    Time  8    Period  Weeks    Status  New    Target Date  07/19/20      SLP LONG TERM GOAL #3   Title  Patient will generate grammatical, fluent, and cogent sentences to  complete abstract/complex linguistic task with 80% accuracy.    Time  8    Period  Weeks    Status  New    Target Date  07/19/20       Plan - 05/29/20 1518    Clinical Impression Statement  Patient demonstrated proficiency in word retrieval tasks. He noted that he did get stuck a couple times and doesnt like that feeling. When answering questions verbally or telling a story, the fast rate of speech contributes to decreased intelligibility. Will continue to work on rate of speech and saliva control.    Speech Therapy Frequency  2x / week    Duration  Other (comment)    Treatment/Interventions  Language facilitation;SLP instruction and feedback;Patient/family education    Potential to Achieve Goals  Good    Potential Considerations  Ability to learn/carryover information;Previous level of function;Co-morbidities;Severity of impairments;Cooperation/participation level;Medical prognosis;Family/community support    Consulted and Agree with Plan of Care  Patient       Patient will benefit from skilled therapeutic intervention in order to improve the following deficits and impairments:   Dysarthria and anarthria    Problem List Patient Active Problem List   Diagnosis Date Noted   Chronic anticoagulation 05/11/2020   Acute CVA (cerebrovascular accident) (HCC) 05/08/2020   Hypercholesteremia    Hypertension    Stroke [facility])    History of mitral valve replacement    CKD (chronic kidney disease), stage III    Leg pain 09/27/2017   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   Venous insufficiency 09/27/2017   GERD (gastroesophageal reflux disease) 09/27/2017    Cephus Slater, Student Intern 05/29/2020, 3:19 PM  [facility] MAIN [facility] SERVICES [address] [place], [place], [address] Phone: [phone]   Fax:  [phone]   Name: Terry [name] MRN: [id number] Date of Birth: 13-Aug-1951

## 2020-05-29 NOTE — Progress Notes (Signed)
Daily Session Note  Patient Details  Name: Terry [name] MRN: [id number] Date of Birth: 02/11/1951 Referring Provider:     Cardiac Rehab from 05/23/2020 in [facility] Cardiac and Pulmonary Rehab  Referring Provider  Isaias Cowman MD      Encounter Date: 05/29/2020  Check In: Session Check In - 05/29/20 0954      Check-In   Supervising physician immediately available to respond to emergencies  See telemetry face sheet for immediately available ER MD    Location  [facility]-Cardiac & Pulmonary Rehab    Staff Present  Nyoka Cowden, RN, BSN, Willette Pa, MA, RCEP, CCRP, [place], [place], ACSM CEP, Exercise Physiologist    Virtual Visit  No    Medication changes reported      No    Fall or balance concerns reported     No    Tobacco Cessation  No Change    Warm-up and Cool-down  Performed on first and last piece of equipment    Resistance Training Performed  No    VAD Patient?  No    PAD/SET Patient?  No      Pain Assessment   Currently in Pain?  No/denies          Social History   Tobacco Use  Smoking Status Former Smoker  . Packs/day: 1.50  . Years: 20.00  . Pack years: 30.00  . Types: Cigarettes  . Quit date: 75  . Years since quitting: 36.4  Smokeless Tobacco Never Used    Goals Met:  Independence with exercise equipment Exercise tolerated well No report of cardiac concerns or symptoms  Goals Unmet:  Not Applicable  Comments: Pt able to follow exercise prescription today without complaint.  Will continue to monitor for progression.    Dr. Emily Filbert is Medical Director for [place] and [facility].

## 2020-05-31 ENCOUNTER — Other Ambulatory Visit: Payer: Self-pay

## 2020-05-31 ENCOUNTER — Encounter: Payer: Medicare HMO | Admitting: *Deleted

## 2020-05-31 DIAGNOSIS — Z952 Presence of prosthetic heart valve: Secondary | ICD-10-CM | POA: Diagnosis not present

## 2020-05-31 NOTE — Progress Notes (Signed)
Daily Session Note  Patient Details  Name: Terry [name] MRN: [id number] Date of Birth: [birth year] Referring Provider:     Cardiac Rehab from 05/23/2020 in [facility] Cardiac and Pulmonary Rehab  Referring Provider  Isaias Cowman MD      Encounter Date: 05/31/2020  Check In: Session Check In - 05/31/20 1019      Check-In   Supervising physician immediately available to respond to emergencies  See telemetry face sheet for immediately available ER MD    Location  [facility]-Cardiac & Pulmonary Rehab    Staff Present  Nyoka Cowden, RN, BSN, MA;Meredith Sherryll Burger, RN BSN;Melissa Tilford Pillar RDN, LDN    Virtual Visit  No    Medication changes reported      Yes    Comments  On predisone and atb for bee sting    Fall or balance concerns reported     No    Tobacco Cessation  No Change    Warm-up and Cool-down  Performed on first and last piece of equipment    Resistance Training Performed  Yes    VAD Patient?  No    PAD/SET Patient?  No      Pain Assessment   Currently in Pain?  No/denies          Social History   Tobacco Use  Smoking Status Former Smoker  . Packs/day: 1.50  . Years: 20.00  . Pack years: 30.00  . Types: Cigarettes  . Quit date: 21  . Years since quitting: 36.4  Smokeless Tobacco Never Used    Goals Met:  Independence with exercise equipment Changing diet to healthy choices, watching portion sizes No report of cardiac concerns or symptoms   Goals Unmet:    Comments: Pt able to follow exercise prescription today without complaint.  Will continue to monitor for progression.    Dr. Emily Filbert is Medical Director for [place] and [facility].

## 2020-06-05 ENCOUNTER — Encounter: Payer: Medicare HMO | Admitting: *Deleted

## 2020-06-05 ENCOUNTER — Encounter: Payer: Self-pay | Admitting: Speech Pathology

## 2020-06-05 ENCOUNTER — Other Ambulatory Visit: Payer: Self-pay

## 2020-06-05 ENCOUNTER — Ambulatory Visit: Payer: Medicare HMO | Admitting: Speech Pathology

## 2020-06-05 DIAGNOSIS — R471 Dysarthria and anarthria: Secondary | ICD-10-CM

## 2020-06-05 DIAGNOSIS — Z952 Presence of prosthetic heart valve: Secondary | ICD-10-CM | POA: Diagnosis not present

## 2020-06-05 NOTE — Therapy (Signed)
[place] MAIN [facility] SERVICES [address] [place], [place], [address] Phone: [phone]   Fax:  [phone]  Speech Language Pathology Treatment  Patient Details  Name: Terry [name] MRN: [id number] Date of Birth: May 02, 1951 Referring Provider (SLP): Dr. Wynetta Emery   Encounter Date: 06/05/2020  End of Session - 06/05/20 1511    Visit Number  3    Number of Visits  14    Date for SLP Re-Evaluation  07/19/20    Authorization Type  Medicare    Authorization Time Period  Start 05/22/2020    Authorization - Visit Number  3    Authorization - Number of Visits  10    SLP Start Time  0155    SLP Stop Time   9675    SLP Time Calculation (min)  50 min       Past Medical History:  Diagnosis Date  . Diverticulosis   . GERD (gastroesophageal reflux disease)   . Heart murmur   . Hypercholesteremia   . Hypertension   . Mitral valve prolapse     Past Surgical History:  Procedure Laterality Date  . COLONOSCOPY WITH PROPOFOL N/A 03/06/2020   Procedure: COLONOSCOPY WITH PROPOFOL;  Surgeon: Toledo, Benay Pike, MD;  Location: [facility] ENDOSCOPY;  Service: Gastroenterology;  Laterality: N/A;  . MITRAL VALVE REPLACEMENT    . RETINAL DETACHMENT SURGERY    . RIGHT/LEFT HEART CATH AND CORONARY ANGIOGRAPHY Bilateral 02/18/2017   Procedure: Right/Left Heart Cath and Coronary Angiography;  Surgeon: Isaias Cowman, MD;  Location: [place] [facility];  Service: Cardiovascular;  Laterality: Bilateral;  . RIGHT/LEFT HEART CATH AND CORONARY ANGIOGRAPHY N/A 02/22/2020   Procedure: RIGHT/LEFT HEART CATH AND CORONARY ANGIOGRAPHY;  Surgeon: Isaias Cowman, MD;  Location: [place] [facility];  Service: Cardiovascular;  Laterality: N/A;  . TEMPOROMANDIBULAR JOINT SURGERY      There were no vitals filed for this visit.  Subjective Assessment - 06/05/20 1510    Subjective  "I'm retired so my entire summer is dedicated to getting better."            ADULT  SLP TREATMENT - 06/05/20 0001      General Information   Behavior/Cognition  Alert;Cooperative;Pleasant mood      Treatment Provided   Treatment provided  Cognitive-Linquistic      Pain Assessment   Pain Assessment  No/denies pain      Cognitive-Linquistic Treatment   Treatment focused on  Cognition    Skilled Treatment  EXPRESSION: Patient completed word analogies (e.g. light is to bright as dark is to __) at 85% accuracy. Improved to 95% with mid cueing from therapist ("think about the relationship between these first two"). Focused on articulation of "k" and "ch" sounds at the word, phrase, and sentence level when reading. Intelligibility at word level: 100%; phrase level: 85%; sentence level: 75%. Described various scenes based on picture at 80% intelligibility with mid cueing ("that one was a little fast, slow down and focus on articulation).      Assessment / Recommendations / Plan   Plan  Continue with current plan of care      Progression Toward Goals   Progression toward goals  Progressing toward goals       SLP Education - 06/05/20 1510    Education Details  Slow rate of speech, over articulate, focus on where your tongue is and how your mouth moves to make these sounds    Person(s) Educated  Patient  Methods  Explanation;Demonstration;Verbal cues    Comprehension  Verbalized understanding;Returned demonstration         SLP Long Term Goals - 05/23/20 1419      SLP LONG TERM GOAL #1   Title  Pt will improve speech intelligibility for sentences and multi-syllabic words by controlling rate of speech, over-articulation, and increased loudness to achieve 80% intelligibility with min. SLP cues.    Time  8    Period  Weeks    Status  New    Target Date  07/19/20      SLP LONG TERM GOAL #2   Title  Patient will complete high level word finding tasks with 80% accuracy.    Time  8    Period  Weeks    Status  New    Target Date  07/19/20      SLP LONG TERM GOAL #3    Title  Patient will generate grammatical, fluent, and cogent sentences to complete abstract/complex linguistic task with 80% accuracy.    Time  8    Period  Weeks    Status  New    Target Date  07/19/20       Plan - 06/05/20 1511    Clinical Impression Statement  Patient slows rate of speech and is much easier to understand during treatment activities. When he trails off into a story and does not focus on over articulation, rate of speech increases and he becomes less intelligible. Will continue to work on rate of speech and articulation to generalize this to everyday speech.    Speech Therapy Frequency  2x / week    Duration  Other (comment)    Treatment/Interventions  Language facilitation;SLP instruction and feedback;Patient/family education    Potential to Achieve Goals  Good    Potential Considerations  Ability to learn/carryover information;Previous level of function;Co-morbidities;Severity of impairments;Cooperation/participation level;Medical prognosis;Family/community support    Consulted and Agree with Plan of Care  Patient       Patient will benefit from skilled therapeutic intervention in order to improve the following deficits and impairments:   Dysarthria and anarthria    Problem List Patient Active Problem List   Diagnosis Date Noted  . Chronic anticoagulation 05/11/2020  . Acute CVA (cerebrovascular accident) (HCC) 05/08/2020  . Hypercholesteremia   . Hypertension   . Stroke (HCC)   . History of mitral valve replacement   . CKD (chronic kidney disease), stage III   . Leg pain 09/27/2017  . 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 Venous insufficiency 09/27/2017  . GERD (gastroesophageal reflux disease) 09/27/2017    Cephus Slater, Student Intern 06/05/2020, 3:12 PM  [place] [facility] MAIN [facility] SERVICES [address] [place], [place], [address] Phone: [phone]   Fax:  [phone]   Name: Terry [name] MRN: [id number] Date of  Birth: [birth year]

## 2020-06-05 NOTE — Progress Notes (Signed)
Daily Session Note  Patient Details  Name: Terry [name] MRN: [id number] Date of Birth: 10/16/1951 Referring Provider:     Cardiac Rehab from 05/23/2020 in [facility] Cardiac and Pulmonary Rehab  Referring Provider  Paraschos, Alexander MD      Encounter Date: 06/05/2020  Check In: Session Check In - 06/05/20 0959      Check-In   Supervising physician immediately available to respond to emergencies  See telemetry face sheet for immediately available ER MD    Location  [facility]-Cardiac & Pulmonary Rehab    Staff Present  Susanne Bice, RN, BSN, CCRP;Melissa Caiola RDN, LDN;Joseph Hood RCP,RRT,BSRT    Virtual Visit  No    Medication changes reported      No    Fall or balance concerns reported     No    Warm-up and Cool-down  Performed on first and last piece of equipment    Resistance Training Performed  Yes    VAD Patient?  No    PAD/SET Patient?  No      Pain Assessment   Currently in Pain?  No/denies          Social History   Tobacco Use  Smoking Status Former Smoker  . Packs/day: 1.50  . Years: 20.00  . Pack years: 30.00  . Types: Cigarettes  . Quit date: 1985  . Years since quitting: 36.4  Smokeless Tobacco Never Used    Goals Met:  Independence with exercise equipment Exercise tolerated well No report of cardiac concerns or symptoms  Goals Unmet:  Not Applicable  Comments: Pt able to follow exercise prescription today without complaint.  Will continue to monitor for progression.    Dr. Mark Miller is Medical Director for [facility] and [facility]. 

## 2020-06-07 ENCOUNTER — Encounter: Payer: Medicare HMO | Admitting: *Deleted

## 2020-06-07 ENCOUNTER — Other Ambulatory Visit: Payer: Self-pay

## 2020-06-07 DIAGNOSIS — Z952 Presence of prosthetic heart valve: Secondary | ICD-10-CM

## 2020-06-07 NOTE — Progress Notes (Signed)
Daily Session Note  Patient Details  Name: Terry [name] MRN: [id number] Date of Birth: [birth year] Referring Provider:     Cardiac Rehab from 05/23/2020 in [facility] Cardiac and Pulmonary Rehab  Referring Provider Isaias Cowman MD      Encounter Date: 06/07/2020  Check In:  Session Check In - 06/07/20 1047      Check-In   Supervising physician immediately available to respond to emergencies See telemetry face sheet for immediately available ER MD    Location [facility]-Cardiac & Pulmonary Rehab    Staff Present Heath Lark, RN, BSN, CCRP;Joseph Hood RCP,RRT,BSRT;Jessica Goodrich, [place], [place], [place], CCET    Virtual Visit No    Medication changes reported     No    Fall or balance concerns reported    No    Warm-up and Cool-down Performed on first and last piece of equipment    Resistance Training Performed Yes    VAD Patient? No    PAD/SET Patient? No      Pain Assessment   Currently in Pain? No/denies              Social History   Tobacco Use  Smoking Status Former Smoker  . Packs/day: 1.50  . Years: 20.00  . Pack years: 30.00  . Types: Cigarettes  . Quit date: 36  . Years since quitting: 36.4  Smokeless Tobacco Never Used    Goals Met:  Independence with exercise equipment Exercise tolerated well No report of cardiac concerns or symptoms  Goals Unmet:  Not Applicable  Comments: Pt able to follow exercise prescription today without complaint.  Will continue to monitor for progression.    Dr. Emily Filbert is Medical Director for [place] and [facility].

## 2020-06-10 ENCOUNTER — Encounter: Payer: Medicare HMO | Admitting: *Deleted

## 2020-06-10 ENCOUNTER — Other Ambulatory Visit: Payer: Self-pay

## 2020-06-10 DIAGNOSIS — Z952 Presence of prosthetic heart valve: Secondary | ICD-10-CM | POA: Diagnosis not present

## 2020-06-10 NOTE — Progress Notes (Signed)
Daily Session Note  Patient Details  Name: Terry [name] MRN: [id number] Date of Birth: 12/06/1951 Referring Provider:     Cardiac Rehab from 05/23/2020 in [facility] Cardiac and Pulmonary Rehab  Referring Provider Paraschos, Alexander MD      Encounter Date: 06/10/2020  Check In:  Session Check In - 06/10/20 1050      Check-In   Supervising physician immediately available to respond to emergencies See telemetry face sheet for immediately available ER MD    Location [facility]-Cardiac & Pulmonary Rehab    Staff Present Meredith Craven, RN BSN;Joseph Hood RCP,RRT,BSRT;Kelly Hayes, BS, ACSM CEP, Exercise Physiologist    Virtual Visit No    Medication changes reported     No    Fall or balance concerns reported    No    Warm-up and Cool-down Performed on first and last piece of equipment    Resistance Training Performed Yes    VAD Patient? No    PAD/SET Patient? No      Pain Assessment   Currently in Pain? No/denies              Social History   Tobacco Use  Smoking Status Former Smoker  . Packs/day: 1.50  . Years: 20.00  . Pack years: 30.00  . Types: Cigarettes  . Quit date: 1985  . Years since quitting: 36.4  Smokeless Tobacco Never Used    Goals Met:  Independence with exercise equipment Exercise tolerated well No report of cardiac concerns or symptoms Strength training completed today  Goals Unmet:  Not Applicable  Comments: Pt able to follow exercise prescription today without complaint.  Will continue to monitor for progression.    Dr. Mark Miller is Medical Director for [facility] and [facility]. 

## 2020-06-11 ENCOUNTER — Other Ambulatory Visit: Payer: Self-pay

## 2020-06-11 ENCOUNTER — Ambulatory Visit: Payer: Medicare HMO | Admitting: Speech Pathology

## 2020-06-11 DIAGNOSIS — R471 Dysarthria and anarthria: Secondary | ICD-10-CM | POA: Diagnosis not present

## 2020-06-12 ENCOUNTER — Encounter: Payer: Self-pay | Admitting: *Deleted

## 2020-06-12 ENCOUNTER — Other Ambulatory Visit: Payer: Self-pay

## 2020-06-12 ENCOUNTER — Encounter: Payer: Self-pay | Admitting: Speech Pathology

## 2020-06-12 ENCOUNTER — Encounter: Payer: Medicare HMO | Admitting: *Deleted

## 2020-06-12 DIAGNOSIS — Z952 Presence of prosthetic heart valve: Secondary | ICD-10-CM | POA: Diagnosis not present

## 2020-06-12 NOTE — Progress Notes (Signed)
Cardiac Individual Treatment Plan  Patient Details  Name: Terry [name] MRN: [id number] Date of Birth: [birth year] Referring Provider:     Cardiac Rehab from 05/23/2020 in [facility] Cardiac and Pulmonary Rehab  Referring Provider Isaias Cowman MD      Initial Encounter Date:    Cardiac Rehab from 05/23/2020 in [facility] Cardiac and Pulmonary Rehab  Date 05/23/20      Visit Diagnosis: S/P mitral valve replacement  Patient's Home Medications on Admission:  Current Outpatient Medications:  .  amiodarone (PACERONE) 200 MG tablet, Take 1 tablet (200 mg total) by mouth daily., Disp: , Rfl:  .  apixaban (ELIQUIS) 5 MG TABS tablet, Take 1 tablet (5 mg total) by mouth 2 (two) times daily., Disp: 60 tablet, Rfl: 0 .  aspirin EC 81 MG EC tablet, Take 1 tablet (81 mg total) by mouth daily., Disp: 30 tablet, Rfl: 0 .  atorvastatin (LIPITOR) 40 MG tablet, Take 1 tablet (40 mg total) by mouth daily., Disp: 30 tablet, Rfl: 0 .  clobetasol ointment (TEMOVATE) 0.93 %, Apply 1 application topically daily as needed (skin irritation). , Disp: , Rfl:  .  Multiple Vitamin (MULTIVITAMIN WITH MINERALS) TABS tablet, Take 1 tablet by mouth at bedtime., Disp: , Rfl:  .  omeprazole (PRILOSEC) 20 MG capsule, Take 20 mg by mouth every evening. , Disp: , Rfl:  .  ondansetron (ZOFRAN) 4 MG tablet, Take 4 mg by mouth every 8 (eight) hours as needed., Disp: , Rfl:  .  triamcinolone cream (KENALOG) 0.1 %, Apply 1 application topically daily as needed (skin irritation.). , Disp: , Rfl:  .  valACYclovir (VALTREX) 500 MG tablet, Take 500 mg by mouth every evening. , Disp: , Rfl:   Past Medical History: Past Medical History:  Diagnosis Date  . Diverticulosis   . GERD (gastroesophageal reflux disease)   . Heart murmur   . Hypercholesteremia   . Hypertension   . Mitral valve prolapse     Tobacco Use: Social History   Tobacco Use  Smoking Status Former Smoker  . Packs/day: 1.50  . Years: 20.00  . Pack years: 30.00   . Types: Cigarettes  . Quit date: 64  . Years since quitting: 36.4  Smokeless Tobacco Never Used    Labs: Recent Review [facility] for [facility] Cardiac and Pulmonary Rehab Latest Ref Rng & Units 05/08/2020   Cholestrol 0 - 200 mg/dL 230(H)   LDLCALC 0 - 99 mg/dL 172(H)   HDL >40 mg/dL 30(L)   Trlycerides <150 mg/dL 142   Hemoglobin A1c 4.8 - 5.6 % 5.4       Exercise Target Goals: Exercise Program Goal: Individual exercise prescription set using results from initial 6 min walk test and THRR while considering  patient's activity barriers and safety.   Exercise Prescription Goal: Initial exercise prescription builds to 30-45 minutes a day of aerobic activity, 2-3 days per week.  Home exercise guidelines will be given to patient during program as part of exercise prescription that the participant will acknowledge.   Education: Aerobic Exercise & Resistance Training: - Gives group verbal and written instruction on the various components of exercise. Focuses on aerobic and resistive training programs and the benefits of this training and how to safely progress through these programs..   Education: Exercise & Equipment Safety: - Individual verbal instruction and demonstration of equipment use and safety with use of the equipment.   Cardiac Rehab from 05/23/2020 in [facility] Cardiac and Pulmonary Rehab  Date 05/23/20  Educator [facility]  Instruction Review Code 1- Verbalizes Understanding      Education: Exercise Physiology & General Exercise Guidelines: - Group verbal and written instruction with models to review the exercise physiology of the cardiovascular system and associated critical values. Provides general exercise guidelines with specific guidelines to those with heart or lung disease.    Education: Flexibility, Balance, Mind/Body Relaxation: Provides group verbal/written instruction on the benefits of flexibility and balance training, including mind/body exercise modes such  as yoga, pilates and tai chi.  Demonstration and skill practice provided.   Activity Barriers & Risk Stratification:  Activity Barriers & Cardiac Risk Stratification - 05/23/20 1046      Activity Barriers & Cardiac Risk Stratification   Activity Barriers Deconditioning;Muscular Weakness;Other (comment);Joint Problems;Balance Concerns;Incisional Pain    Comments recent CVA, minimal residuals, R rotator cuff tear    Cardiac Risk Stratification High           6 Minute Walk:  6 Minute Walk    Row Name 05/23/20 1044         6 Minute Walk   Phase Initial     Distance 1215 feet     Walk Time 6 minutes     # of Rest Breaks 0     MPH 2.3     METS 2.93     RPE 7     VO2 Peak 10.25     Symptoms No     Resting HR 80 bpm     Resting BP 126/64     Resting Oxygen Saturation  97 %     Exercise Oxygen Saturation  during 6 min walk 99 %     Max Ex. HR 98 bpm     Max Ex. BP 134/70     2 Minute Post BP 110/56            Oxygen Initial Assessment:   Oxygen Re-Evaluation:   Oxygen Discharge (Final Oxygen Re-Evaluation):   Initial Exercise Prescription:  Initial Exercise Prescription - 05/23/20 1000      Date of Initial Exercise RX and Referring Provider   Date 05/23/20    Referring Provider Paraschos, Alexander MD      Treadmill   MPH 2.3    Grade 1    Minutes 15    METs 3.08      Recumbant Bike   Level 2    RPM 50    Watts 23    Minutes 15    METs 3      NuStep   Level 3    SPM 80    Minutes 15    METs 3      Prescription Details   Frequency (times per week) 3    Duration Progress to 30 minutes of continuous aerobic without signs/symptoms of physical distress      Intensity   THRR 40-80% of Max Heartrate 108/136    Ratings of Perceived Exertion 11-13    Perceived Dyspnea 0-4      Progression   Progression Continue to progress workloads to maintain intensity without signs/symptoms of physical distress.      Resistance Training   Training  Prescription Yes    Weight 3 lb    Reps 10-15           Perform Capillary Blood Glucose checks as needed.  Exercise Prescription Changes:  Exercise Prescription Changes    Row Name 05/23/20 1000  Response to Exercise   Blood Pressure (Admit) 126/64       Blood Pressure (Exercise) 134/70       Blood Pressure (Exit) 110/56       Heart Rate (Admit) 80 bpm       Heart Rate (Exercise) 98 bpm       Heart Rate (Exit) 88 bpm       Oxygen Saturation (Admit) 97 %       Oxygen Saturation (Exercise) 99 %       Rating of Perceived Exertion (Exercise) 7       Symptoms none       Comments walk test results              Exercise Comments:   Exercise Goals and Review:  Exercise Goals    Row Name 05/23/20 1052             Exercise Goals   Increase Physical Activity Yes       Intervention Provide advice, education, support and counseling about physical activity/exercise needs.;Develop an individualized exercise prescription for aerobic and resistive training based on initial evaluation findings, risk stratification, comorbidities and participant's personal goals.       Expected Outcomes Short Term: Attend rehab on a regular basis to increase amount of physical activity.;Long Term: Add in home exercise to make exercise part of routine and to increase amount of physical activity.;Long Term: Exercising regularly at least 3-5 days a week.       Increase Strength and Stamina Yes       Intervention Provide advice, education, support and counseling about physical activity/exercise needs.;Develop an individualized exercise prescription for aerobic and resistive training based on initial evaluation findings, risk stratification, comorbidities and participant's personal goals.       Expected Outcomes Short Term: Increase workloads from initial exercise prescription for resistance, speed, and METs.;Short Term: Perform resistance training exercises routinely during rehab and add in  resistance training at home;Long Term: Improve cardiorespiratory fitness, muscular endurance and strength as measured by increased METs and functional capacity (6MWT)       Able to understand and use rate of perceived exertion (RPE) scale Yes       Intervention Provide education and explanation on how to use RPE scale       Expected Outcomes Short Term: Able to use RPE daily in rehab to express subjective intensity level;Long Term:  Able to use RPE to guide intensity level when exercising independently       Able to understand and use Dyspnea scale Yes       Intervention Provide education and explanation on how to use Dyspnea scale       Expected Outcomes Short Term: Able to use Dyspnea scale daily in rehab to express subjective sense of shortness of breath during exertion;Long Term: Able to use Dyspnea scale to guide intensity level when exercising independently       Knowledge and understanding of Target Heart Rate Range (THRR) Yes       Intervention Provide education and explanation of THRR including how the numbers were predicted and where they are located for reference       Expected Outcomes Short Term: Able to state/look up THRR;Short Term: Able to use daily as guideline for intensity in rehab;Long Term: Able to use THRR to govern intensity when exercising independently       Able to check pulse independently Yes       Intervention Provide education and demonstration on  how to check pulse in carotid and radial arteries.;Review the importance of being able to check your own pulse for safety during independent exercise       Expected Outcomes Short Term: Able to explain why pulse checking is important during independent exercise;Long Term: Able to check pulse independently and accurately       Understanding of Exercise Prescription Yes       Intervention Provide education, explanation, and written materials on patient's individual exercise prescription       Expected Outcomes Short Term: Able to  explain program exercise prescription;Long Term: Able to explain home exercise prescription to exercise independently              Exercise Goals Re-Evaluation :   Discharge Exercise Prescription (Final Exercise Prescription Changes):  Exercise Prescription Changes - 05/23/20 1000      Response to Exercise   Blood Pressure (Admit) 126/64    Blood Pressure (Exercise) 134/70    Blood Pressure (Exit) 110/56    Heart Rate (Admit) 80 bpm    Heart Rate (Exercise) 98 bpm    Heart Rate (Exit) 88 bpm    Oxygen Saturation (Admit) 97 %    Oxygen Saturation (Exercise) 99 %    Rating of Perceived Exertion (Exercise) 7    Symptoms none    Comments walk test results           Nutrition:  Target Goals: Understanding of nutrition guidelines, daily intake of sodium <1564m, cholesterol <2033m calories 30% from fat and 7% or less from saturated fats, daily to have 5 or more servings of fruits and vegetables.  Education: Controlling Sodium/Reading Food Labels -Group verbal and written material supporting the discussion of sodium use in heart healthy nutrition. Review and explanation with models, verbal and written materials for utilization of the food label.   Education: General Nutrition Guidelines/Fats and Fiber: -Group instruction provided by verbal, written material, models and posters to present the general guidelines for heart healthy nutrition. Gives an explanation and review of dietary fats and fiber.   Biometrics:  Pre Biometrics - 05/23/20 1052      Pre Biometrics   Height 5' 9.6" (1.768 m)    Weight 175 lb (79.4 kg)    BMI (Calculated) 25.39    Single Leg Stand 3.28 seconds            Nutrition Therapy Plan and Nutrition Goals:   Nutrition Assessments:  Nutrition Assessments - 05/23/20 1054      MEDFICTS Scores   Pre Score 93           MEDIFICTS Score Key:          ?70 Need to make dietary changes          40-70 Heart Healthy Diet         ? 40 Therapeutic  Level Cholesterol Diet  Nutrition Goals Re-Evaluation:   Nutrition Goals Discharge (Final Nutrition Goals Re-Evaluation):   Psychosocial: Target Goals: Acknowledge presence or absence of significant depression and/or stress, maximize coping skills, provide positive support system. Participant is able to verbalize types and ability to use techniques and skills needed for reducing stress and depression.   Education: Depression - Provides group verbal and written instruction on the correlation between heart/lung disease and depressed mood, treatment options, and the stigmas associated with seeking treatment.   Education: Sleep Hygiene -Provides group verbal and written instruction about how sleep can affect your health.  Define sleep hygiene, discuss sleep cycles and impact  of sleep habits. Review good sleep hygiene tips.     Education: Stress and Anxiety: - Provides group verbal and written instruction about the health risks of elevated stress and causes of high stress.  Discuss the correlation between heart/lung disease and anxiety and treatment options. Review healthy ways to manage with stress and anxiety.    Initial Review & Psychosocial Screening:  Initial Psych Review & Screening - 05/21/20 1254      Initial Review   Current issues with None Identified;Current Stress Concerns    Source of Stress Concerns Chronic Illness    Comments He has had two strokes within a week. He can look to his wife and son for support. He has a positive outlook on his health currenty.      Family Dynamics   Good Support System? Yes      Barriers   Psychosocial barriers to participate in program There are no identifiable barriers or psychosocial needs.;The patient should benefit from training in stress management and relaxation.;Psychosocial barriers identified (see note)      Screening Interventions   Interventions To provide support and resources with identified psychosocial needs;Provide  feedback about the scores to participant;Encouraged to exercise    Expected Outcomes Short Term goal: Utilizing psychosocial counselor, staff and physician to assist with identification of specific Stressors or current issues interfering with healing process. Setting desired goal for each stressor or current issue identified.;Long Term Goal: Stressors or current issues are controlled or eliminated.;Short Term goal: Identification and review with participant of any Quality of Life or Depression concerns found by scoring the questionnaire.;Long Term goal: The participant improves quality of Life and PHQ9 Scores as seen by post scores and/or verbalization of changes           Quality of Life Scores:   Quality of Life - 05/23/20 1054      Quality of Life   Select Quality of Life      Quality of Life Scores   Health/Function Pre 7.4 %    Socioeconomic Pre 14.79 %    Psych/Spiritual Pre 13.79 %    Family Pre 14.4 %    GLOBAL Pre 11.26 %          Scores of 19 and below usually indicate a poorer quality of life in these areas.  A difference of  2-3 points is a clinically meaningful difference.  A difference of 2-3 points in the total score of the Quality of Life Index has been associated with significant improvement in overall quality of life, self-image, physical symptoms, and general health in studies assessing change in quality of life.  PHQ-9: Recent Review Flowsheet Data    Depression screen [facility] 2/9 05/23/2020   Decreased Interest 2   Down, Depressed, Hopeless 1   PHQ - 2 Score 3   Altered sleeping 1   Tired, decreased energy 1   Change in appetite 2   Feeling bad or failure about yourself  1   Trouble concentrating 1   Moving slowly or fidgety/restless 2   Suicidal thoughts 0   PHQ-9 Score 11   Difficult doing work/chores Somewhat difficult     Interpretation of Total Score  Total Score Depression Severity:  1-4 = Minimal depression, 5-9 = Mild depression, 10-14 = Moderate  depression, 15-19 = Moderately severe depression, 20-27 = Severe depression   Psychosocial Evaluation and Intervention:  Psychosocial Evaluation - 05/21/20 1306      Psychosocial Evaluation & Interventions   Interventions Encouraged to  exercise with the program and follow exercise prescription    Comments He has had two strokes within a week. He can look to his wife and son for support. He has a positive outlook on his health currenty.    Expected Outcomes Short: Start Heart Track Long: Continue to attend Rehab to help with patients mental state.    Continue Psychosocial Services  Follow up required by staff           Psychosocial Re-Evaluation:   Psychosocial Discharge (Final Psychosocial Re-Evaluation):   Vocational Rehabilitation: Provide vocational rehab assistance to qualifying candidates.   Vocational Rehab Evaluation & Intervention:   Education: Education Goals: Education classes will be provided on a variety of topics geared toward better understanding of heart health and risk factor modification. Participant will state understanding/return demonstration of topics presented as noted by education test scores.  Learning Barriers/Preferences:  Learning Barriers/Preferences - 05/21/20 1258      Learning Barriers/Preferences   Learning Barriers None    Learning Preferences None           General Cardiac Education Topics:  AED/CPR: - Group verbal and written instruction with the use of models to demonstrate the basic use of the AED with the basic ABC's of resuscitation.   Anatomy & Physiology of the Heart: - Group verbal and written instruction and models provide basic cardiac anatomy and physiology, with the coronary electrical and arterial systems. Review of Valvular disease and Heart Failure   Cardiac Procedures: - Group verbal and written instruction to review commonly prescribed medications for heart disease. Reviews the medication, class of the drug, and  side effects. Includes the steps to properly store meds and maintain the prescription regimen. (beta blockers and nitrates)   Cardiac Medications I: - Group verbal and written instruction to review commonly prescribed medications for heart disease. Reviews the medication, class of the drug, and side effects. Includes the steps to properly store meds and maintain the prescription regimen.   Cardiac Medications II: -Group verbal and written instruction to review commonly prescribed medications for heart disease. Reviews the medication, class of the drug, and side effects. (all other drug classes)    Go Sex-Intimacy & Heart Disease, Get SMART - Goal Setting: - Group verbal and written instruction through game format to discuss heart disease and the return to sexual intimacy. Provides group verbal and written material to discuss and apply goal setting through the application of the S.M.A.R.T. Method.   Other Matters of the Heart: - Provides group verbal, written materials and models to describe Stable Angina and Peripheral Artery. Includes description of the disease process and treatment options available to the cardiac patient.   Infection Prevention: - Provides verbal and written material to individual with discussion of infection control including proper hand washing and proper equipment cleaning during exercise session.   Cardiac Rehab from 05/23/2020 in [facility] Cardiac and Pulmonary Rehab  Date 05/23/20  Educator [facility]  Instruction Review Code 1- Verbalizes Understanding      Falls Prevention: - Provides verbal and written material to individual with discussion of falls prevention and safety.   Cardiac Rehab from 05/23/2020 in [facility] Cardiac and Pulmonary Rehab  Date 05/23/20  Educator [facility]  Instruction Review Code 1- Verbalizes Understanding      Other: -Provides group and verbal instruction on various topics (see comments)   Knowledge Questionnaire Score:  Knowledge Questionnaire  Score - 05/23/20 1054      Knowledge Questionnaire Score   Pre Score 25/26 Education  Focus: Nutrition           Core Components/Risk Factors/Patient Goals at Admission:  Personal Goals and Risk Factors at Admission - 05/23/20 1055      Core Components/Risk Factors/Patient Goals on Admission    Weight Management Yes;Weight Loss    Intervention Weight Management: Develop a combined nutrition and exercise program designed to reach desired caloric intake, while maintaining appropriate intake of nutrient and fiber, sodium and fats, and appropriate energy expenditure required for the weight goal.;Weight Management: Provide education and appropriate resources to help participant work on and attain dietary goals.    Admit Weight 175 lb (79.4 kg)    Goal Weight: Short Term 170 lb (77.1 kg)    Goal Weight: Long Term 165 lb (74.8 kg)    Expected Outcomes Short Term: Continue to assess and modify interventions until short term weight is achieved;Long Term: Adherence to nutrition and physical activity/exercise program aimed toward attainment of established weight goal;Understanding recommendations for meals to include 15-35% energy as protein, 25-35% energy from fat, 35-60% energy from carbohydrates, less than 28m of dietary cholesterol, 20-35 gm of total fiber daily;Understanding of distribution of calorie intake throughout the day with the consumption of 4-5 meals/snacks;Weight Loss: Understanding of general recommendations for a balanced deficit meal plan, which promotes 1-2 lb weight loss per week and includes a negative energy balance of [phone] kcal/d    Hypertension Yes    Intervention Provide education on lifestyle modifcations including regular physical activity/exercise, weight management, moderate sodium restriction and increased consumption of fresh fruit, vegetables, and low fat dairy, alcohol moderation, and smoking cessation.;Monitor prescription use compliance.    Expected Outcomes Short  Term: Continued assessment and intervention until BP is < 140/964mHG in hypertensive participants. < 130/8055mG in hypertensive participants with diabetes, heart failure or chronic kidney disease.;Long Term: Maintenance of blood pressure at goal levels.    Lipids Yes    Intervention Provide education and support for participant on nutrition & aerobic/resistive exercise along with prescribed medications to achieve LDL <14m31mDL >40mg3m Expected Outcomes Short Term: Participant states understanding of desired cholesterol values and is compliant with medications prescribed. Participant is following exercise prescription and nutrition guidelines.;Long Term: Cholesterol controlled with medications as prescribed, with individualized exercise RX and with personalized nutrition plan. Value goals: LDL < 14mg,12m > 40 mg.           Education:Diabetes - Individual verbal and written instruction to review signs/symptoms of diabetes, desired ranges of glucose level fasting, after meals and with exercise. Acknowledge that pre and post exercise glucose checks will be done for 3 sessions at entry of program.   Education: Know Your Numbers and Risk Factors: -Group verbal and written instruction about important numbers in your health.  Discussion of what are risk factors and how they play a role in the disease process.  Review of Cholesterol, Blood Pressure, Diabetes, and BMI and the role they play in your overall health.   Core Components/Risk Factors/Patient Goals Review:    Core Components/Risk Factors/Patient Goals at Discharge (Final Review):    ITP Comments:  ITP Comments    Row Name 05/21/20 1303 05/23/20 1044 06/12/20 0644       ITP Comments Virtual Visit completed. Patient informed on EP and RD appointment and 6 Minute walk test. Patient also informed of patient health questionnaires on My Chart. Patient Verbalizes understanding. Visit diagnosis can be found in [facility]20[facility]. Completed 6MWT  and gym orientation.  Initial  ITP created and sent for review to Dr. Emily Filbert, Medical Director. 30 Day review completed. Medical Director ITP review done, changes made as directed, and signed approval by Medical Director.            Comments: 30 Day review completed. Medical Director ITP review done, changes made as directed, and signed approval by Medical Director.

## 2020-06-12 NOTE — Therapy (Signed)
[place] [facility] MAIN [facility] SERVICES [address] [place], [place], [address] Phone: [phone]   Fax:  [phone]  Speech Language Pathology Treatment  Patient Details  Name: Terry [name] MRN: [id number] Date of Birth: [birth year] Referring Provider (SLP): Dr. Laural Benes   Encounter Date: 06/11/2020   End of Session - 06/12/20 0942    Visit Number 4    Number of Visits 14    Date for SLP Re-Evaluation 07/19/20    Authorization Type Medicare    Authorization Time Period Start 05/22/2020    Authorization - Visit Number 4    Authorization - Number of Visits 10    SLP Start Time 0300    SLP Stop Time  0350    SLP Time Calculation (min) 50 min    Activity Tolerance Patient tolerated treatment well           Past Medical History:  Diagnosis Date  . Diverticulosis   . GERD (gastroesophageal reflux disease)   . Heart murmur   . Hypercholesteremia   . Hypertension   . Mitral valve prolapse     Past Surgical History:  Procedure Laterality Date  . COLONOSCOPY WITH PROPOFOL N/A 03/06/2020   Procedure: COLONOSCOPY WITH PROPOFOL;  Surgeon: Toledo, Boykin Nearing, MD;  Location: [facility] ENDOSCOPY;  Service: Gastroenterology;  Laterality: N/A;  . MITRAL VALVE REPLACEMENT    . RETINAL DETACHMENT SURGERY    . RIGHT/LEFT HEART CATH AND CORONARY ANGIOGRAPHY Bilateral 02/18/2017   Procedure: Right/Left Heart Cath and Coronary Angiography;  Surgeon: Marcina Millard, MD;  Location: [facility] INVASIVE CV LAB;  Service: Cardiovascular;  Laterality: Bilateral;  . RIGHT/LEFT HEART CATH AND CORONARY ANGIOGRAPHY N/A 02/22/2020   Procedure: RIGHT/LEFT HEART CATH AND CORONARY ANGIOGRAPHY;  Surgeon: Marcina Millard, MD;  Location: [facility] INVASIVE CV LAB;  Service: Cardiovascular;  Laterality: N/A;  . TEMPOROMANDIBULAR JOINT SURGERY      There were no vitals filed for this visit.   Subjective Assessment - 06/12/20 0941    Subjective Talkative, determined to improve  speech                 ADULT SLP TREATMENT - 06/12/20 0001      General Information   Behavior/Cognition Alert;Cooperative;Pleasant mood      Treatment Provided   Treatment provided Cognitive-Linquistic      Pain Assessment   Pain Assessment No/denies pain      Cognitive-Linquistic Treatment   Treatment focused on Cognition    Skilled Treatment EXPRESSION: Added item to category (f=3) at 90% accuracy. Named the category at 80% accuracy. Read aloud words, phrases, and sentences that focused on "f" at the beginning of the word. Practiced slowing rate of speech and over articulating. Mentioned concerns with reading out loud.      Assessment / Recommendations / Plan   Plan Continue with current plan of care      Progression Toward Goals   Progression toward goals Progressing toward goals            SLP Education - 06/12/20 0942    Education Details Slow rate of speech, over articulate    Person(s) Educated Patient    Methods Explanation;Demonstration    Comprehension Verbalized understanding              SLP Long Term Goals - 05/23/20 1419      SLP LONG TERM GOAL #1   Title Pt will improve speech intelligibility for sentences and multi-syllabic words by controlling rate of speech,  over-articulation, and increased loudness to achieve 80% intelligibility with min. SLP cues.    Time 8    Period Weeks    Status New    Target Date 07/19/20      SLP LONG TERM GOAL #2   Title Patient will complete high level word finding tasks with 80% accuracy.    Time 8    Period Weeks    Status New    Target Date 07/19/20      SLP LONG TERM GOAL #3   Title Patient will generate grammatical, fluent, and cogent sentences to complete abstract/complex linguistic task with 80% accuracy.    Time 8    Period Weeks    Status New    Target Date 07/19/20            Plan - 06/12/20 0942    Clinical Impression Statement Patient expressed that he has been able to slow his rate  of speech. He is intelligible at the conversational level but still complains of excess saliva and unclear thinking when he speaks. Showed confusion several times between naming a category and adding an item to the category.    Speech Therapy Frequency 2x / week    Duration Other (comment)    Treatment/Interventions Language facilitation;SLP instruction and feedback;Patient/family education    Potential to Achieve Goals Good    Potential Considerations Ability to learn/carryover information;Previous level of function;Co-morbidities;Severity of impairments;Cooperation/participation level;Medical prognosis;Family/community support    Consulted and Agree with Plan of Care Patient           Patient will benefit from skilled therapeutic intervention in order to improve the following deficits and impairments:   Dysarthria and anarthria    Problem List Patient Active Problem List   Diagnosis Date Noted  . Chronic anticoagulation 05/11/2020  . Acute CVA (cerebrovascular accident) ([place]) 05/08/2020  . Hypercholesteremia   . Hypertension   . Stroke ([place])   . History of mitral valve replacement   . CKD (chronic kidney disease), stage III   . Leg pain 09/27/2017  . 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 Venous insufficiency 09/27/2017  . GERD (gastroesophageal reflux disease) 09/27/2017    Maylon Cos, Student Intern 06/12/2020, 9:43 AM  [place] MAIN [facility] SERVICES [address] [place], [place], [address] Phone: [phone]   Fax:  [phone]   Name: Terry [name] MRN: [id number] Date of Birth: November 27, 1951

## 2020-06-12 NOTE — Progress Notes (Signed)
Daily Session Note  Patient Details  Name: Terry [name] MRN: [id number] Date of Birth: 1951-08-15 Referring Provider:     Cardiac Rehab from 05/23/2020 in [facility] Cardiac and Pulmonary Rehab  Referring Provider Isaias Cowman MD      Encounter Date: 06/12/2020  Check In:  Session Check In - 06/12/20 1013      Check-In   Supervising physician immediately available to respond to emergencies See telemetry face sheet for immediately available ER MD    Location [facility]-Cardiac & Pulmonary Rehab    Staff Present Renita Papa, RN BSN;Joseph Foy [place], [place], ACSM CEP, Exercise Physiologist    Virtual Visit No    Medication changes reported     No    Fall or balance concerns reported    No    Warm-up and Cool-down Performed on first and last piece of equipment    Resistance Training Performed Yes    VAD Patient? No    PAD/SET Patient? No      Pain Assessment   Currently in Pain? No/denies              Social History   Tobacco Use  Smoking Status Former Smoker  . Packs/day: 1.50  . Years: 20.00  . Pack years: 30.00  . Types: Cigarettes  . Quit date: 57  . Years since quitting: 36.4  Smokeless Tobacco Never Used    Goals Met:  Independence with exercise equipment Exercise tolerated well No report of cardiac concerns or symptoms Strength training completed today  Goals Unmet:  Not Applicable  Comments: Pt able to follow exercise prescription today without complaint.  Will continue to monitor for progression.    Dr. Emily Filbert is Medical Director for [place] and [facility].

## 2020-06-13 ENCOUNTER — Ambulatory Visit: Payer: Medicare HMO | Admitting: Speech Pathology

## 2020-06-13 ENCOUNTER — Encounter: Payer: Self-pay | Admitting: Speech Pathology

## 2020-06-13 ENCOUNTER — Other Ambulatory Visit: Payer: Self-pay

## 2020-06-13 DIAGNOSIS — R471 Dysarthria and anarthria: Secondary | ICD-10-CM

## 2020-06-13 NOTE — Therapy (Signed)
[place] MAIN [facility] SERVICES [address] [place], [place], [address] Phone: [phone]   Fax:  [phone]  Speech Language Pathology Treatment  Patient Details  Name: Terry [name] MRN: [id number] Date of Birth: Oct 02, 1951 Referring Provider (SLP): Dr. Wynetta Emery   Encounter Date: 06/13/2020   End of Session - 06/13/20 1600    Visit Number 5    Number of Visits 14    Date for SLP Re-Evaluation 07/19/20    Authorization Type Medicare    Authorization Time Period Start 05/22/2020    Authorization - Visit Number 5    Authorization - Number of Visits 10    SLP Start Time 0300    SLP Stop Time  0350    SLP Time Calculation (min) 50 min    Activity Tolerance Patient tolerated treatment well           Past Medical History:  Diagnosis Date  . Diverticulosis   . GERD (gastroesophageal reflux disease)   . Heart murmur   . Hypercholesteremia   . Hypertension   . Mitral valve prolapse     Past Surgical History:  Procedure Laterality Date  . COLONOSCOPY WITH PROPOFOL N/A 03/06/2020   Procedure: COLONOSCOPY WITH PROPOFOL;  Surgeon: Toledo, Benay Pike, MD;  Location: [facility] ENDOSCOPY;  Service: Gastroenterology;  Laterality: N/A;  . MITRAL VALVE REPLACEMENT    . RETINAL DETACHMENT SURGERY    . RIGHT/LEFT HEART CATH AND CORONARY ANGIOGRAPHY Bilateral 02/18/2017   Procedure: Right/Left Heart Cath and Coronary Angiography;  Surgeon: Isaias Cowman, MD;  Location: [place] CV LAB;  Service: Cardiovascular;  Laterality: Bilateral;  . RIGHT/LEFT HEART CATH AND CORONARY ANGIOGRAPHY N/A 02/22/2020   Procedure: RIGHT/LEFT HEART CATH AND CORONARY ANGIOGRAPHY;  Surgeon: Isaias Cowman, MD;  Location: [place] CV LAB;  Service: Cardiovascular;  Laterality: N/A;  . TEMPOROMANDIBULAR JOINT SURGERY      There were no vitals filed for this visit.   Subjective Assessment - 06/13/20 1559    Subjective Attentive, willing to participate                  ADULT SLP TREATMENT - 06/13/20 0001      General Information   Behavior/Cognition Alert;Cooperative;Pleasant mood      Treatment Provided   Treatment provided Cognitive-Linquistic      Pain Assessment   Pain Assessment No/denies pain      Cognitive-Linquistic Treatment   Treatment focused on Cognition    Skilled Treatment EXPRESSION: Came up with rhyming words given clues at 90% accuracy. Read through words, phrases, and sentences involving "l" blends, "s" blends", and "v" at the end of words, focusing on over articulation and decreased rate of speech. Tends to articulate beginning of words more clearly than the ends.      Assessment / Recommendations / Plan   Plan Continue with current plan of care      Progression Toward Goals   Progression toward goals Progressing toward goals            SLP Education - 06/13/20 1559    Education Details Over articulate endings of words    Person(s) Educated Patient    Methods Explanation;Demonstration    Comprehension Verbalized understanding;Returned demonstration              SLP Long Term Goals - 05/23/20 1419      SLP LONG TERM GOAL #1   Title Pt will improve speech intelligibility for sentences and multi-syllabic words by controlling  rate of speech, over-articulation, and increased loudness to achieve 80% intelligibility with min. SLP cues.    Time 8    Period Weeks    Status New    Target Date 07/19/20      SLP LONG TERM GOAL #2   Title Patient will complete high level word finding tasks with 80% accuracy.    Time 8    Period Weeks    Status New    Target Date 07/19/20      SLP LONG TERM GOAL #3   Title Patient will generate grammatical, fluent, and cogent sentences to complete abstract/complex linguistic task with 80% accuracy.    Time 8    Period Weeks    Status New    Target Date 07/19/20            Plan - 06/13/20 1600    Clinical Impression Statement Patient is improving overall  rate of speech, even in conversation when he is not necessarily focusing on it. Sometimes gets stuck and pauses mid-sentence when he cannot find the right words. Will continue to work on word finding strategies.    Speech Therapy Frequency 2x / week    Duration Other (comment)    Treatment/Interventions Language facilitation;SLP instruction and feedback;Patient/family education    Potential to Achieve Goals Good    Potential Considerations Ability to learn/carryover information;Previous level of function;Co-morbidities;Severity of impairments;Cooperation/participation level;Medical prognosis;Family/community support           Patient will benefit from skilled therapeutic intervention in order to improve the following deficits and impairments:   Dysarthria and anarthria    Problem List Patient Active Problem List   Diagnosis Date Noted  . Chronic anticoagulation 05/11/2020  . Acute CVA (cerebrovascular accident) ([facility]) 05/08/2020  . Hypercholesteremia   . Hypertension   . Stroke ([facility])   . History of mitral valve replacement   . CKD (chronic kidney disease), stage III   . Leg pain 09/27/2017  . 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 Venous insufficiency 09/27/2017  . GERD (gastroesophageal reflux disease) 09/27/2017    Cephus Slater, Student Intern 06/13/2020, 4:01 PM  [place] [facility] MAIN [facility] SERVICES [address] [place], [place], [address] Phone: [phone]   Fax:  [phone]   Name: Terry [name] MRN: [id number] Date of Birth: [birth year]

## 2020-06-14 ENCOUNTER — Encounter: Payer: Medicare HMO | Admitting: *Deleted

## 2020-06-14 ENCOUNTER — Other Ambulatory Visit: Payer: Self-pay

## 2020-06-14 DIAGNOSIS — Z952 Presence of prosthetic heart valve: Secondary | ICD-10-CM

## 2020-06-14 NOTE — Progress Notes (Signed)
Daily Session Note  Patient Details  Name: Terry [name] MRN: [id number] Date of Birth: [birth year] Referring Provider:     Cardiac Rehab from 05/23/2020 in [facility] Cardiac and Pulmonary Rehab  Referring Provider Isaias Cowman MD      Encounter Date: 06/14/2020  Check In:  Session Check In - 06/14/20 0957      Check-In   Supervising physician immediately available to respond to emergencies See telemetry face sheet for immediately available ER MD    Location [facility]-Cardiac & Pulmonary Rehab    Staff Present Renita Papa, RN BSN;Joseph [address] [place], [place], RCEP, CCRP, CCET    Virtual Visit No    Medication changes reported     No    Fall or balance concerns reported    No    Warm-up and Cool-down Performed on first and last piece of equipment    Resistance Training Performed Yes    VAD Patient? No    PAD/SET Patient? No      Pain Assessment   Currently in Pain? No/denies              Social History   Tobacco Use  Smoking Status Former Smoker  . Packs/day: 1.50  . Years: 20.00  . Pack years: 30.00  . Types: Cigarettes  . Quit date: 83  . Years since quitting: 36.4  Smokeless Tobacco Never Used    Goals Met:  Independence with exercise equipment Exercise tolerated well No report of cardiac concerns or symptoms Strength training completed today  Goals Unmet:  Not Applicable  Comments: Pt able to follow exercise prescription today without complaint.  Will continue to monitor for progression.    Dr. Emily Filbert is Medical Director for [place] and [facility].

## 2020-06-17 ENCOUNTER — Encounter: Payer: Medicare HMO | Admitting: *Deleted

## 2020-06-17 ENCOUNTER — Other Ambulatory Visit: Payer: Self-pay

## 2020-06-17 DIAGNOSIS — Z952 Presence of prosthetic heart valve: Secondary | ICD-10-CM

## 2020-06-17 NOTE — Progress Notes (Signed)
Daily Session Note  Patient Details  Name: Terry [name] MRN: [id number] Date of Birth: 08-08-1951 Referring Provider:     Cardiac Rehab from 05/23/2020 in [facility] Cardiac and Pulmonary Rehab  Referring Provider Isaias Cowman MD      Encounter Date: 06/17/2020  Check In:  Session Check In - 06/17/20 1020      Check-In   Supervising physician immediately available to respond to emergencies See telemetry face sheet for immediately available ER MD    Location [facility]-Cardiac & Pulmonary Rehab    Staff Present Justin Mend RCP,RRT,BSRT;Chella Chapdelaine Sherryll Burger, RN BSN;Jessica Luan [place], MA, RCEP, CCRP, [place], BS, ACSM CEP, Exercise Physiologist    Virtual Visit No    Medication changes reported     No    Fall or balance concerns reported    No    Warm-up and Cool-down Performed on first and last piece of equipment    Resistance Training Performed Yes    VAD Patient? No    PAD/SET Patient? No      Pain Assessment   Currently in Pain? No/denies              Social History   Tobacco Use  Smoking Status Former Smoker  . Packs/day: 1.50  . Years: 20.00  . Pack years: 30.00  . Types: Cigarettes  . Quit date: 13  . Years since quitting: 36.4  Smokeless Tobacco Never Used    Goals Met:  Independence with exercise equipment Exercise tolerated well No report of cardiac concerns or symptoms Strength training completed today  Goals Unmet:  Not Applicable  Comments: Pt able to follow exercise prescription today without complaint.  Will continue to monitor for progression.  Reviewed home exercise with pt today.  Pt plans to walk at home and go to [employer] for exercise.  Reviewed THR, pulse, RPE, sign and symptoms, pulse oximetery and when to call 911 or MD.  Also discussed weather considerations and indoor options.  Pt voiced understanding.    Dr. Emily Filbert is Medical Director for [place] and [facility].

## 2020-06-18 ENCOUNTER — Encounter: Payer: Self-pay | Admitting: Speech Pathology

## 2020-06-18 ENCOUNTER — Other Ambulatory Visit: Payer: Self-pay

## 2020-06-18 ENCOUNTER — Ambulatory Visit: Payer: Medicare HMO | Admitting: Speech Pathology

## 2020-06-18 ENCOUNTER — Ambulatory Visit: Payer: Medicare HMO

## 2020-06-18 DIAGNOSIS — R471 Dysarthria and anarthria: Secondary | ICD-10-CM

## 2020-06-18 DIAGNOSIS — I69851 Hemiplegia and hemiparesis following other cerebrovascular disease affecting right dominant side: Secondary | ICD-10-CM

## 2020-06-18 NOTE — Therapy (Signed)
[place] MAIN [facility] SERVICES [address] [place], [place], [address] Phone: [phone]   Fax:  [phone]  Physical Therapy Evaluation  Patient Details  Name: Terry [name] MRN: [id number] Date of Birth: 1951/03/15 Referring Provider (PT): Harrel Lemon, MD    Encounter Date: 06/18/2020   PT End of Session - 06/18/20 1426    Visit Number 1    Number of Visits 1    PT Start Time 1350    PT Stop Time 7096    PT Time Calculation (min) 35 min    Activity Tolerance Patient tolerated treatment well;No increased pain    Behavior During Therapy WFL for tasks assessed/performed           Past Medical History:  Diagnosis Date  . Diverticulosis   . GERD (gastroesophageal reflux disease)   . Heart murmur   . Hypercholesteremia   . Hypertension   . Mitral valve prolapse     Past Surgical History:  Procedure Laterality Date  . COLONOSCOPY WITH PROPOFOL N/A 03/06/2020   Procedure: COLONOSCOPY WITH PROPOFOL;  Surgeon: Toledo, Benay Pike, MD;  Location: [facility] ENDOSCOPY;  Service: Gastroenterology;  Laterality: N/A;  . MITRAL VALVE REPLACEMENT    . RETINAL DETACHMENT SURGERY    . RIGHT/LEFT HEART CATH AND CORONARY ANGIOGRAPHY Bilateral 02/18/2017   Procedure: Right/Left Heart Cath and Coronary Angiography;  Surgeon: Isaias Cowman, MD;  Location: [place] CV LAB;  Service: Cardiovascular;  Laterality: Bilateral;  . RIGHT/LEFT HEART CATH AND CORONARY ANGIOGRAPHY N/A 02/22/2020   Procedure: RIGHT/LEFT HEART CATH AND CORONARY ANGIOGRAPHY;  Surgeon: Isaias Cowman, MD;  Location: [place] CV LAB;  Service: Cardiovascular;  Laterality: N/A;  . TEMPOROMANDIBULAR JOINT SURGERY      There were no vitals filed for this visit.    Subjective Assessment - 06/18/20 1356    Subjective Pt presenting to OPPT for evaluation after CVA in May 2021.    Pertinent History Pt reports having a porcine valve replacement x2 back in May 2021, then  a few weeks later started having difficulty with speech and some vague RUE symptoms which progressed to weakness. Pt denies ever having difficulty with paresthesias, visual issues, acute balance changes. Pt is currently in cardiac rehab for 36 weeks. Pt currently working with SLP for mild expressive aphasia, but no difficulty with receptive language.    How long can you sit comfortably? not limited    How long can you stand comfortably? not limited    How long can you walk comfortably? not limited    Currently in Pain? No/denies              [facility] PT Assessment - 06/18/20 0001      Assessment   Medical Diagnosis s/p CVA    Referring Provider (PT) Harrel Lemon, MD     Onset Date/Surgical Date --   mid May 2021   Hand Dominance Right    Next MD Visit non schedule    Prior Therapy acute care only       Precautions   Precautions None      Balance Screen   Has the patient fallen in the past 6 months No    Has the patient had a decrease in activity level because of a fear of falling?  No    Is the patient reluctant to leave their home because of a fear of falling?  No      Prior Function   Level of Independence Independent;Independent  with gait;Independent with transfers    Vocation Retired    Leisure formerly fishing, gardening, golfing, none recently since cardiac surgery      Observation/Other Assessments   Focus on Therapeutic Outcomes (FOTO)  74/100          EXAMINATION:  MMT: -Scapular elevation: 5/5 bilat  -Shoulder flexion 5/5 Left, 4/5 Right painful (old RC injury) -Shoulder extension 5/5 bilat  -Shoulder abduction 5/5 Lt, 4+/5 Right, painful (old RC injury) -Shoulder IR/ER 5/5 Lt, 4/5 Rt and painful (old RC injury) -Elbow flexion/ extension 5/5 bilat, pain free -Wrist Extension/ flexion, pain free    Pt denies any notable gross motor weakness, also denies any fine motor difficult or tool manipulation.      Objective measurements completed on examination:  See above findings.     PT Education - 06/18/20 1424    Education provided Yes    Education Details educated on utility of PT for nonoperative RC pathology should he need help with RUE in future    Person(s) Educated Patient    Methods Explanation    Comprehension Verbalized understanding               PT Long Term Goals - 03/30/18 1130      PT LONG TERM GOAL #1   Title Patient will improve FOTO and MODI scores by 10 points demonstrating improved function with daily tasks with decreased pain    Status New    Target Date 04/20/18      PT LONG TERM GOAL #2   Title Patient will improve FOTO and MODI scores by 20 points demonstrating improved function with daily tasks with decreased pain    Status New    Target Date 05/11/18      PT LONG TERM GOAL #3   Title Patient will be independent with home program for pain control and exercises to allow transition to self management    Baseline patin has limited knowledge of appropriate pain control strategies and exercise progression without guidance, cuing    Status New    Target Date 05/11/18                  Plan - 06/18/20 1426    Clinical Impression Statement Pt presenting for OPPT evaluation of RUE dysfunction s/p CVA. Pt has no appreciable impairment of RUE at this point in time. Pt has mild weakness of Right shoulder and corresponding pain with isometric resistance which is related to chronic rotator cuff injury from 10 years ago, does nto appear neurological in nature. Pt will be DC from PT services, as he has no skilled needs at this time.    Stability/Clinical Decision Making Stable/Uncomplicated    Clinical Decision Making Low    Consulted and Agree with Plan of Care Patient           Patient will benefit from skilled therapeutic intervention in order to improve the following deficits and impairments:  Decreased knowledge of precautions  Visit Diagnosis: Hemiplegia and hemiparesis following other  cerebrovascular disease affecting right dominant side [facility])     Problem List Patient Active Problem List   Diagnosis Date Noted  . Chronic anticoagulation 05/11/2020  . Acute CVA (cerebrovascular accident) ([facility]) 05/08/2020  . Hypercholesteremia   . Hypertension   . Stroke ([facility])   . History of mitral valve replacement   . CKD (chronic kidney disease), stage III   . Leg pain 09/27/2017  . 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 Venous insufficiency  09/27/2017  . GERD (gastroesophageal reflux disease) 09/27/2017   2:31 PM, 06/18/20 Rosamaria Lints, PT, DPT Physical Therapist - [facility]  Outpatient Physical Therapy- Main Campus [phone]     Rosamaria Lints 06/18/2020, 2:30 PM  [place] [facility] MAIN [facility] SERVICES [address] [place], [place], [address] Phone: [phone]   Fax:  [phone]  Name: Terry [name] MRN: [id number] Date of Birth: 09-Jan-1951

## 2020-06-18 NOTE — Therapy (Signed)
[place] MA[place] [facility] SERVICES [address] [place], [place], [address] Phone: [phone]   Fax:  [phone]  Speech Language Pathology Treatment  Patient Details  Name: Terry [name] MRN: [id number] Date of Birth: [birth year] Referring Provider (SLP): Dr. Wynetta Emery   Encounter Date: 06/18/2020   End of Session - 06/18/20 1558    Visit Number 6    Number of Visits 14    Date for SLP Re-Evaluation 07/19/20    Authorization Type Medicare    Authorization Time Period Start 05/22/2020    Authorization - Visit Number 6    Authorization - Number of Visits 10    SLP Start Time 0100    SLP Stop Time  0145    SLP Time Calculation (min) 45 min    Activity Tolerance Patient tolerated treatment well;No increased pain           Past Medical History:  Diagnosis Date  . Diverticulosis   . GERD (gastroesophageal reflux disease)   . Heart murmur   . Hypercholesteremia   . Hypertension   . Mitral valve prolapse     Past Surgical History:  Procedure Laterality Date  . COLONOSCOPY WITH PROPOFOL N/A 03/06/2020   Procedure: COLONOSCOPY WITH PROPOFOL;  Surgeon: Toledo, Benay Pike, MD;  Location: [facility] ENDOSCOPY;  Service: Gastroenterology;  Laterality: N/A;  . MITRAL VALVE REPLACEMENT    . RETINAL DETACHMENT SURGERY    . RIGHT/LEFT HEART CATH AND CORONARY ANGIOGRAPHY Bilateral 02/18/2017   Procedure: Right/Left Heart Cath and Coronary Angiography;  Surgeon: Isaias Cowman, MD;  Location: [place] CV LAB;  Service: Cardiovascular;  Laterality: Bilateral;  . RIGHT/LEFT HEART CATH AND CORONARY ANGIOGRAPHY N/A 02/22/2020   Procedure: RIGHT/LEFT HEART CATH AND CORONARY ANGIOGRAPHY;  Surgeon: Isaias Cowman, MD;  Location: [place] CV LAB;  Service: Cardiovascular;  Laterality: N/A;  . TEMPOROMANDIBULAR JOINT SURGERY      There were no vitals filed for this visit.   Subjective Assessment - 06/18/20 1558    Subjective Determined and  attentive                 ADULT SLP TREATMENT - 06/18/20 0001      General Information   Behavior/Cognition Alert;Cooperative;Pleasant mood      Treatment Provided   Treatment provided Cognitive-Linquistic      Pain Assessment   Pain Assessment No/denies pain      Cognitive-Linquistic Treatment   Treatment focused on Cognition    Skilled Treatment COMPREHENSION: Identified wrong item in category (f=5) at 90% accuracy independently. EXPRESSION: Named category of abstract terms at 85% accuracy. Answered general information questions fluently and coherently at 90% accuracy. Read aloud words, phrases, and sentences ending in "ch" at 85% accuracy.      Assessment / Recommendations / Plan   Plan Continue with current plan of care      Progression Toward Goals   Progression toward goals Progressing toward goals            SLP Education - 06/18/20 1558    Education Details Rate of speech, over articulation    Person(s) Educated Patient    Methods Explanation;Demonstration    Comprehension Verbalized understanding;Returned demonstration              SLP Long Term Goals - 05/23/20 1419      SLP LONG TERM GOAL #1   Title Pt will improve speech intelligibility for sentences and multi-syllabic words by controlling rate of speech, over-articulation, and increased  loudness to achieve 80% intelligibility with min. SLP cues.    Time 8    Period Weeks    Status New    Target Date 07/19/20      SLP LONG TERM GOAL #2   Title Patient will complete high level word finding tasks with 80% accuracy.    Time 8    Period Weeks    Status New    Target Date 07/19/20      SLP LONG TERM GOAL #3   Title Patient will generate grammatical, fluent, and cogent sentences to complete abstract/complex linguistic task with 80% accuracy.    Time 8    Period Weeks    Status New    Target Date 07/19/20            Plan - 06/18/20 1559    Clinical Impression Statement Patient is  demonstrating a decreased rate of speech when communicating. Word retrieval comes easier when there is no pressure, especially when he is telling a story or delivering information he is familiar with. Still complains of excess saliva during speech.    Speech Therapy Frequency 2x / week    Duration Other (comment)    Treatment/Interventions Language facilitation;SLP instruction and feedback;Patient/family education    Potential to Achieve Goals Good    Potential Considerations Ability to learn/carryover information;Previous level of function;Co-morbidities;Severity of impairments;Cooperation/participation level;Medical prognosis;Family/community support           Patient will benefit from skilled therapeutic intervention in order to improve the following deficits and impairments:   Dysarthria and anarthria    Problem List Patient Active Problem List   Diagnosis Date Noted  . Chronic anticoagulation 05/11/2020  . Acute CVA (cerebrovascular accident) (HCC) 05/08/2020  . Hypercholesteremia   . Hypertension   . Stroke (HCC)   . History of mitral valve replacement   . CKD (chronic kidney disease), stage III   . Leg pain 09/27/2017  . 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 Venous insufficiency 09/27/2017  . GERD (gastroesophageal reflux disease) 09/27/2017    Terry [name], Student Intern 06/18/2020, 3:59 PM  [place] [facility] MAIN [facility] SERVICES [address] [place], [place], [address] Phone: [phone]   Fax:  [phone]   Name: Terry [name] MRN: [id number] Date of Birth: 12/22/1951

## 2020-06-19 ENCOUNTER — Other Ambulatory Visit: Payer: Self-pay

## 2020-06-19 ENCOUNTER — Encounter: Payer: Medicare HMO | Admitting: *Deleted

## 2020-06-19 DIAGNOSIS — Z952 Presence of prosthetic heart valve: Secondary | ICD-10-CM

## 2020-06-19 NOTE — Progress Notes (Signed)
Daily Session Note  Patient Details  Name: Terry [name] MRN: [id number] Date of Birth: Jul 25, 1951 Referring Provider:     Cardiac Rehab from 05/23/2020 in [facility] Cardiac and Pulmonary Rehab  Referring Provider Isaias Cowman MD      Encounter Date: 06/19/2020  Check In:  Session Check In - 06/19/20 1028      Check-In   Supervising physician immediately available to respond to emergencies See telemetry face sheet for immediately available ER MD    Location [facility]-Cardiac & Pulmonary Rehab    Staff Present Renita Papa, RN BSN;Melissa Caiola RDN, Rowe Pavy, BA, ACSM CEP, Exercise Physiologist    Virtual Visit No    Medication changes reported     No    Fall or balance concerns reported    No    Warm-up and Cool-down Performed on first and last piece of equipment    Resistance Training Performed Yes    VAD Patient? No    PAD/SET Patient? No      Pain Assessment   Currently in Pain? No/denies              Social History   Tobacco Use  Smoking Status Former Smoker  . Packs/day: 1.50  . Years: 20.00  . Pack years: 30.00  . Types: Cigarettes  . Quit date: 45  . Years since quitting: 36.4  Smokeless Tobacco Never Used    Goals Met:  Independence with exercise equipment Exercise tolerated well No report of cardiac concerns or symptoms Strength training completed today  Goals Unmet:  Not Applicable  Comments: Pt able to follow exercise prescription today without complaint.  Will continue to monitor for progression.    Dr. Emily Filbert is Medical Director for [place] and [facility].

## 2020-06-20 ENCOUNTER — Other Ambulatory Visit: Payer: Self-pay

## 2020-06-20 ENCOUNTER — Ambulatory Visit: Payer: Medicare HMO | Admitting: Speech Pathology

## 2020-06-20 ENCOUNTER — Encounter: Payer: Self-pay | Admitting: Speech Pathology

## 2020-06-20 DIAGNOSIS — R4701 Aphasia: Secondary | ICD-10-CM

## 2020-06-20 DIAGNOSIS — R471 Dysarthria and anarthria: Secondary | ICD-10-CM

## 2020-06-20 NOTE — Therapy (Signed)
[place] MAIN [facility] SERVICES [address] [place], [place], [address] Phone: [phone]   Fax:  [phone]  Speech Language Pathology Treatment  Patient Details  Name: Terry [name] MRN: [id number] Date of Birth: [birth year] Referring Provider (SLP): Terry [name]   Encounter Date: 06/20/2020   End of Session - 06/20/20 1508    Visit Number 7    Number of Visits 14    Date for SLP Re-Evaluation 07/19/20    Authorization Type Medicare    Authorization Time Period Start 05/22/2020    Progress Note Due on Visit 10    SLP Start Time 1300    SLP Stop Time  1350    SLP Time Calculation (min) 50 min    Activity Tolerance Patient tolerated treatment well           Past Medical History:  Diagnosis Date  . Diverticulosis   . GERD (gastroesophageal reflux disease)   . Heart murmur   . Hypercholesteremia   . Hypertension   . Mitral valve prolapse     Past Surgical History:  Procedure Laterality Date  . COLONOSCOPY WITH PROPOFOL N/A 03/06/2020   Procedure: COLONOSCOPY WITH PROPOFOL;  Surgeon: Terry [name], Terry Pike, MD;  Location: [facility] ENDOSCOPY;  Service: Gastroenterology;  Laterality: N/A;  . MITRAL VALVE REPLACEMENT    . RETINAL DETACHMENT SURGERY    . RIGHT/LEFT HEART CATH AND CORONARY ANGIOGRAPHY Bilateral 02/18/2017   Procedure: Right/Left Heart Cath and Coronary Angiography;  Surgeon: Terry Cowman, MD;  Location: [place] CV LAB;  Service: Cardiovascular;  Laterality: Bilateral;  . RIGHT/LEFT HEART CATH AND CORONARY ANGIOGRAPHY N/A 02/22/2020   Procedure: RIGHT/LEFT HEART CATH AND CORONARY ANGIOGRAPHY;  Surgeon: Terry Cowman, MD;  Location: [place] CV LAB;  Service: Cardiovascular;  Laterality: N/A;  . TEMPOROMANDIBULAR JOINT SURGERY      There were no vitals filed for this visit.   Subjective Assessment - 06/20/20 1507    Subjective Determined and attentive                 ADULT SLP TREATMENT -  06/20/20 0001      General Information   Behavior/Cognition Alert;Cooperative;Pleasant mood      Treatment Provided   Treatment provided Cognitive-Linquistic      Pain Assessment   Pain Assessment No/denies pain      Cognitive-Linquistic Treatment   Treatment focused on Cognition    Skilled Treatment COMPREHENSION: Identified wrong item in category (f=5) at 90% accuracy independently. EXPRESSION: Named category of abstract terms at 85% accuracy. Answered general information questions fluently and coherently at 90% accuracy. Read aloud words, phrases, and sentences ending in "ch" at 85% accuracy.      Assessment / Recommendations / Plan   Plan Continue with current plan of care      Progression Toward Goals   Progression toward goals Progressing toward goals            SLP Education - 06/20/20 1507    Education Details Rate of speech, use specific language to express yourself    Person(s) Educated Patient    Methods Explanation    Comprehension Verbalized understanding              SLP Long Term Goals - 05/23/20 1419      SLP LONG TERM GOAL #1   Title Pt will improve speech intelligibility for sentences and multi-syllabic words by controlling rate of speech, over-articulation, and increased loudness to achieve 80% intelligibility with min.  SLP cues.    Time 8    Period Weeks    Status New    Target Date 07/19/20      SLP LONG TERM GOAL #2   Title Patient will complete high level word finding tasks with 80% accuracy.    Time 8    Period Weeks    Status New    Target Date 07/19/20      SLP LONG TERM GOAL #3   Title Patient will generate grammatical, fluent, and cogent sentences to complete abstract/complex linguistic task with 80% accuracy.    Time 8    Period Weeks    Status New    Target Date 07/19/20            Plan - 06/20/20 1509    Clinical Impression Statement The patient is improving in speech intelligibility across tasks with fewer cues.  He is  more aware of vague language and attempts to clarify with fewer reminders given.    Speech Therapy Frequency 2x / week    Duration Other (comment)    Treatment/Interventions Language facilitation;SLP instruction and feedback;Patient/family education    Potential to Achieve Goals Good    Potential Considerations Ability to learn/carryover information;Previous level of function;Co-morbidities;Severity of impairments;Cooperation/participation level;Medical prognosis;Family/community support    Consulted and Agree with Plan of Care Patient           Patient will benefit from skilled therapeutic intervention in order to improve the following deficits and impairments:   Dysarthria and anarthria  Aphasia    Problem List Patient Active Problem List   Diagnosis Date Noted  . Chronic anticoagulation 05/11/2020  . Acute CVA (cerebrovascular accident) (HCC) 05/08/2020  . Hypercholesteremia   . Hypertension   . Stroke (HCC)   . History of mitral valve replacement   . CKD (chronic kidney disease), stage III   . Leg pain 09/27/2017  . 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 Venous insufficiency 09/27/2017  . GERD (gastroesophageal reflux disease) 09/27/2017   Terry Primrose, MS/CCC- SLP  Terry [name] 06/20/2020, 3:09 PM  [name] MAIN [facility] SERVICES [address] [place], [place], [address] Phone: [phone]   Fax:  [phone]   Name: Terry [name] MRN: [id number] Date of Birth: 1951/03/29

## 2020-06-24 ENCOUNTER — Encounter: Payer: Medicare HMO | Admitting: *Deleted

## 2020-06-24 ENCOUNTER — Other Ambulatory Visit: Payer: Self-pay

## 2020-06-24 DIAGNOSIS — Z952 Presence of prosthetic heart valve: Secondary | ICD-10-CM | POA: Diagnosis not present

## 2020-06-24 NOTE — Progress Notes (Signed)
Daily Session Note  Patient Details  Name: Terry [name] MRN: [id number] Date of Birth: Sep 13, 1951 Referring Provider:     Cardiac Rehab from 05/23/2020 in [facility] Cardiac and Pulmonary Rehab  Referring Provider Isaias Cowman MD      Encounter Date: 06/24/2020  Check In:  Session Check In - 06/24/20 1033      Check-In   Supervising physician immediately available to respond to emergencies See telemetry face sheet for immediately available ER MD    Location [facility]-Cardiac & Pulmonary Rehab    Staff Present Renita Papa, RN BSN;Joseph Foy [place], [place], ACSM CEP, Exercise Physiologist;Kara Eliezer Bottom, MS Exercise Physiologist    Virtual Visit No    Medication changes reported     No    Fall or balance concerns reported    No    Warm-up and Cool-down Performed on first and last piece of equipment    Resistance Training Performed Yes    VAD Patient? No    PAD/SET Patient? No      Pain Assessment   Currently in Pain? No/denies              Social History   Tobacco Use  Smoking Status Former Smoker  . Packs/day: 1.50  . Years: 20.00  . Pack years: 30.00  . Types: Cigarettes  . Quit date: 91  . Years since quitting: 36.5  Smokeless Tobacco Never Used    Goals Met:  Independence with exercise equipment Exercise tolerated well No report of cardiac concerns or symptoms Strength training completed today  Goals Unmet:  Not Applicable  Comments: Pt able to follow exercise prescription today without complaint.  Will continue to monitor for progression.    Dr. Emily Filbert is Medical Director for [place] and [facility].

## 2020-06-25 ENCOUNTER — Ambulatory Visit: Payer: Medicare HMO

## 2020-06-26 ENCOUNTER — Other Ambulatory Visit: Payer: Self-pay

## 2020-06-26 DIAGNOSIS — Z952 Presence of prosthetic heart valve: Secondary | ICD-10-CM

## 2020-06-26 NOTE — Progress Notes (Signed)
Daily Session Note  Patient Details  Name: Terry [name] MRN: [id number] Date of Birth: 1951-05-13 Referring Provider:     Cardiac Rehab from 05/23/2020 in [facility] Cardiac and Pulmonary Rehab  Referring Provider Isaias Cowman MD      Encounter Date: 06/26/2020  Check In:      Social History   Tobacco Use  Smoking Status Former Smoker  . Packs/day: 1.50  . Years: 20.00  . Pack years: 30.00  . Types: Cigarettes  . Quit date: 49  . Years since quitting: 36.5  Smokeless Tobacco Never Used    Goals Met:  Independence with exercise equipment Exercise tolerated well No report of cardiac concerns or symptoms Strength training completed today  Goals Unmet:  Not Applicable  Comments: Pt able to follow exercise prescription today without complaint.  Will continue to monitor for progression.   Dr. Emily Filbert is Medical Director for [place] and [facility].

## 2020-06-27 ENCOUNTER — Ambulatory Visit: Payer: Medicare HMO | Attending: Internal Medicine | Admitting: Speech Pathology

## 2020-06-27 ENCOUNTER — Other Ambulatory Visit: Payer: Self-pay

## 2020-06-27 ENCOUNTER — Encounter: Payer: Self-pay | Admitting: Speech Pathology

## 2020-06-27 DIAGNOSIS — R471 Dysarthria and anarthria: Secondary | ICD-10-CM | POA: Insufficient documentation

## 2020-06-27 NOTE — Therapy (Signed)
[place] [facility] MAIN [facility] SERVICES [address] [place], [place], [address] Phone: [phone]   Fax:  [phone]  Speech Language Pathology Treatment  Patient Details  Name: Terry [name] MRN: [id number] Date of Birth: 1951-07-15 Referring Provider (SLP): Dr. Laural Benes   Encounter Date: 06/27/2020   End of Session - 06/27/20 1608    Visit Number 8    Number of Visits 14    Date for SLP Re-Evaluation 07/19/20    Authorization Type Medicare    Authorization Time Period Start 05/22/2020    Authorization - Visit Number 7    Authorization - Number of Visits 10    Progress Note Due on Visit 10    SLP Start Time 0100    SLP Stop Time  0150    SLP Time Calculation (min) 50 min    Activity Tolerance Patient tolerated treatment well           Past Medical History:  Diagnosis Date   Diverticulosis    GERD (gastroesophageal reflux disease)    Heart murmur    Hypercholesteremia    Hypertension    Mitral valve prolapse     Past Surgical History:  Procedure Laterality Date   COLONOSCOPY WITH PROPOFOL N/A 03/06/2020   Procedure: COLONOSCOPY WITH PROPOFOL;  Surgeon: Toledo, Boykin Nearing, MD;  Location: [facility] ENDOSCOPY;  Service: Gastroenterology;  Laterality: N/A;   MITRAL VALVE REPLACEMENT     RETINAL DETACHMENT SURGERY     RIGHT/LEFT HEART CATH AND CORONARY ANGIOGRAPHY Bilateral 02/18/2017   Procedure: Right/Left Heart Cath and Coronary Angiography;  Surgeon: Marcina Millard, MD;  Location: [facility] INVASIVE CV LAB;  Service: Cardiovascular;  Laterality: Bilateral;   RIGHT/LEFT HEART CATH AND CORONARY ANGIOGRAPHY N/A 02/22/2020   Procedure: RIGHT/LEFT HEART CATH AND CORONARY ANGIOGRAPHY;  Surgeon: Marcina Millard, MD;  Location: [facility] INVASIVE CV LAB;  Service: Cardiovascular;  Laterality: N/A;   TEMPOROMANDIBULAR JOINT SURGERY      There were no vitals filed for this visit.   Subjective Assessment - 06/27/20 1608    Subjective  Hard-working, attentive                 ADULT SLP TREATMENT - 06/27/20 0001      General Information   Behavior/Cognition Alert;Cooperative;Pleasant mood      Treatment Provided   Treatment provided Cognitive-Linquistic      Pain Assessment   Pain Assessment No/denies pain      Cognitive-Linquistic Treatment   Treatment focused on Cognition    Skilled Treatment EXPRESSION: Given story starters, patient came up with a few sentences to complete the story at 80% intelligibility. Read aloud phrases, words, and sentences with "p" at the end and "s" blends at the beginning at 85% intelligibility.      Assessment / Recommendations / Plan   Plan Continue with current plan of care      Progression Toward Goals   Progression toward goals Progressing toward goals            SLP Education - 06/27/20 1608    Education Details Rate of speech    Person(s) Educated Patient    Methods Explanation    Comprehension Verbalized understanding              SLP Long Term Goals - 05/23/20 1419      SLP LONG TERM GOAL #1   Title Pt will improve speech intelligibility for sentences and multi-syllabic words by controlling rate of speech, over-articulation, and increased loudness  to achieve 80% intelligibility with min. SLP cues.    Time 8    Period Weeks    Status New    Target Date 07/19/20      SLP LONG TERM GOAL #2   Title Patient will complete high level word finding tasks with 80% accuracy.    Time 8    Period Weeks    Status New    Target Date 07/19/20      SLP LONG TERM GOAL #3   Title Patient will generate grammatical, fluent, and cogent sentences to complete abstract/complex linguistic task with 80% accuracy.    Time 8    Period Weeks    Status New    Target Date 07/19/20            Plan - 06/27/20 1610    Clinical Impression Statement Patient demonstrates a high level of intelligibility when he is relaxed and telling stories in conversation. He notes that he  has more difficulty when people try to talk over him or when he gets frustrated in group settings. Patient is improving rate of speech; still slurs some words.    Speech Therapy Frequency 2x / week    Duration Other (comment)    Treatment/Interventions Language facilitation;SLP instruction and feedback;Patient/family education    Potential to Achieve Goals Good    Potential Considerations Ability to learn/carryover information;Previous level of function;Co-morbidities;Severity of impairments;Cooperation/participation level;Medical prognosis;Family/community support           Patient will benefit from skilled therapeutic intervention in order to improve the following deficits and impairments:   Dysarthria and anarthria    Problem List Patient Active Problem List   Diagnosis Date Noted   Chronic anticoagulation 05/11/2020   Acute CVA (cerebrovascular accident) (HCC) 05/08/2020   Hypercholesteremia    Hypertension    Stroke [facility])    History of mitral valve replacement    CKD (chronic kidney disease), stage III    Leg pain 09/27/2017   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   Venous insufficiency 09/27/2017   GERD (gastroesophageal reflux disease) 09/27/2017    Cephus Slater, Student Intern 06/27/2020, 4:11 PM  [place] [facility] MAIN [facility] SERVICES [address] [place], [place], [address] Phone: [phone]   Fax:  [phone]   Name: Terry [name] MRN: [id number] Date of Birth: 12/13/1951

## 2020-06-28 ENCOUNTER — Encounter: Payer: Medicare HMO | Attending: Cardiology | Admitting: *Deleted

## 2020-06-28 DIAGNOSIS — Z952 Presence of prosthetic heart valve: Secondary | ICD-10-CM | POA: Diagnosis present

## 2020-06-28 NOTE — Progress Notes (Signed)
Daily Session Note  Patient Details  Name: Terry [name] MRN: [id number] Date of Birth: May 06, 1951 Referring Provider:     Cardiac Rehab from 05/23/2020 in [facility] Cardiac and Pulmonary Rehab  Referring Provider Isaias Cowman MD      Encounter Date: 06/28/2020  Check In:  Session Check In - 06/28/20 1017      Check-In   Supervising physician immediately available to respond to emergencies See telemetry face sheet for immediately available ER MD    Location [facility]-Cardiac & Pulmonary Rehab    Staff Present Nyoka Cowden, RN, BSN, MA;Joseph [address] [place], [place], RCEP, CCRP, CCET    Virtual Visit No    Medication changes reported     No    Fall or balance concerns reported    No    Tobacco Cessation No Change    Warm-up and Cool-down Performed on first and last piece of equipment    Resistance Training Performed Yes    VAD Patient? No      Pain Assessment   Currently in Pain? No/denies              Social History   Tobacco Use  Smoking Status Former Smoker  . Packs/day: 1.50  . Years: 20.00  . Pack years: 30.00  . Types: Cigarettes  . Quit date: 54  . Years since quitting: 36.5  Smokeless Tobacco Never Used    Goals Met:  Independence with exercise equipment Exercise tolerated well No report of cardiac concerns or symptoms Strength training completed today  Goals Unmet:  Not Applicable  Comments: Pt able to follow exercise prescription today without complaint.  Will continue to monitor for progression.   Dr. Emily Filbert is Medical Director for [place] and [facility].

## 2020-07-02 ENCOUNTER — Ambulatory Visit: Payer: Medicare HMO | Admitting: Speech Pathology

## 2020-07-02 ENCOUNTER — Other Ambulatory Visit: Payer: Self-pay

## 2020-07-02 ENCOUNTER — Encounter: Payer: Self-pay | Admitting: Speech Pathology

## 2020-07-02 ENCOUNTER — Ambulatory Visit: Payer: Medicare HMO | Admitting: Physical Therapy

## 2020-07-02 DIAGNOSIS — R471 Dysarthria and anarthria: Secondary | ICD-10-CM

## 2020-07-02 NOTE — Therapy (Signed)
[facility] MAIN [facility] SERVICES [address] [place], [place], [address] Phone: [phone]   Fax:  [phone]  Speech Language Pathology Treatment  Patient Details  Name: Terry [name] MRN: [id number] Date of Birth: November 14, 1951 Referring Provider (SLP): Dr. Laural Benes   Encounter Date: 07/02/2020   End of Session - 07/02/20 1602    Visit Number 9    Number of Visits 14    Date for SLP Re-Evaluation 07/19/20    Authorization Type Medicare    Authorization Time Period Start 05/22/2020    Authorization - Visit Number 9    Authorization - Number of Visits 10    Progress Note Due on Visit 10    SLP Start Time 0150    SLP Stop Time  0240    SLP Time Calculation (min) 50 min    Activity Tolerance Patient tolerated treatment well           Past Medical History:  Diagnosis Date   Diverticulosis    GERD (gastroesophageal reflux disease)    Heart murmur    Hypercholesteremia    Hypertension    Mitral valve prolapse     Past Surgical History:  Procedure Laterality Date   COLONOSCOPY WITH PROPOFOL N/A 03/06/2020   Procedure: COLONOSCOPY WITH PROPOFOL;  Surgeon: Toledo, Boykin Nearing, MD;  Location: [facility] ENDOSCOPY;  Service: Gastroenterology;  Laterality: N/A;   MITRAL VALVE REPLACEMENT     RETINAL DETACHMENT SURGERY     RIGHT/LEFT HEART CATH AND CORONARY ANGIOGRAPHY Bilateral 02/18/2017   Procedure: Right/Left Heart Cath and Coronary Angiography;  Surgeon: Marcina Millard, MD;  Location: [facility] INVASIVE CV LAB;  Service: Cardiovascular;  Laterality: Bilateral;   RIGHT/LEFT HEART CATH AND CORONARY ANGIOGRAPHY N/A 02/22/2020   Procedure: RIGHT/LEFT HEART CATH AND CORONARY ANGIOGRAPHY;  Surgeon: Marcina Millard, MD;  Location: [facility] INVASIVE CV LAB;  Service: Cardiovascular;  Laterality: N/A;   TEMPOROMANDIBULAR JOINT SURGERY      There were no vitals filed for this visit.   Subjective Assessment - 07/02/20 1601    Subjective Im  starting to realize that my slower pace is actually normal                   SLP Education - 07/02/20 1602    Education Details Rate of speech    Person(s) Educated Patient    Methods Explanation    Comprehension Verbalized understanding              SLP Long Term Goals - 05/23/20 1419      SLP LONG TERM GOAL #1   Title Pt will improve speech intelligibility for sentences and multi-syllabic words by controlling rate of speech, over-articulation, and increased loudness to achieve 80% intelligibility with min. SLP cues.    Time 8    Period Weeks    Status New    Target Date 07/19/20      SLP LONG TERM GOAL #2   Title Patient will complete high level word finding tasks with 80% accuracy.    Time 8    Period Weeks    Status New    Target Date 07/19/20      SLP LONG TERM GOAL #3   Title Patient will generate grammatical, fluent, and cogent sentences to complete abstract/complex linguistic task with 80% accuracy.    Time 8    Period Weeks    Status New    Target Date 07/19/20  Plan - 07/02/20 1602    Clinical Impression Statement Patient is demonstrating more comfortability in conversation. He is becoming more used to a slowed rate of speech, but still mentions difficulty with some word finding, especially in larger social situations. Will continue to work on strategies for word finding during conversation.    Speech Therapy Frequency 2x / week    Duration Other (comment)    Treatment/Interventions Language facilitation;SLP instruction and feedback;Patient/family education    Potential to Achieve Goals Good    Potential Considerations Ability to learn/carryover information;Previous level of function;Co-morbidities;Severity of impairments;Cooperation/participation level;Medical prognosis;Family/community support           Patient will benefit from skilled therapeutic intervention in order to improve the following deficits and impairments:    Dysarthria and anarthria    Problem List Patient Active Problem List   Diagnosis Date Noted   Chronic anticoagulation 05/11/2020   Acute CVA (cerebrovascular accident) (HCC) 05/08/2020   Hypercholesteremia    Hypertension    Stroke [facility])    History of mitral valve replacement    CKD (chronic kidney disease), stage III    Leg pain 09/27/2017   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   Venous insufficiency 09/27/2017   GERD (gastroesophageal reflux disease) 09/27/2017    Leandrew Koyanagi, Student Intern 07/03/2020, 8:29 AM  [facility] MAIN [facility] SERVICES [address] [place], [place], [address] Phone: [phone]   Fax:  [phone]   Name: Terry [name] MRN: [id number] Date of Birth: August 03, 1951

## 2020-07-03 ENCOUNTER — Other Ambulatory Visit: Payer: Self-pay

## 2020-07-03 ENCOUNTER — Encounter: Payer: Medicare HMO | Admitting: *Deleted

## 2020-07-03 DIAGNOSIS — Z952 Presence of prosthetic heart valve: Secondary | ICD-10-CM | POA: Diagnosis not present

## 2020-07-03 NOTE — Progress Notes (Signed)
Daily Session Note  Patient Details  Name: Terry [name] MRN: [id number] Date of Birth: 18-May-1951 Referring Provider:     Cardiac Rehab from 05/23/2020 in [facility]  Referring Provider Isaias Cowman MD      Encounter Date: 07/03/2020  Check In:  Session Check In - 07/03/20 1007      Check-In   Supervising physician immediately available to respond to emergencies See telemetry face sheet for immediately available ER MD    Location [facility] Pulmonary Rehab    Staff Present Renita Papa, RN BSN;Joseph Foy [place], [place], ACSM CEP, Exercise Physiologist    Virtual Visit No    Medication changes reported     No    Fall or balance concerns reported    No    Warm-up and Cool-down Performed on first and last piece of equipment    Resistance Training Performed Yes    VAD Patient? No    PAD/SET Patient? No      Pain Assessment   Currently in Pain? No/denies              Social History   Tobacco Use  Smoking Status Former Smoker  . Packs/day: 1.50  . Years: 20.00  . Pack years: 30.00  . Types: Cigarettes  . Quit date: 54  . Years since quitting: 36.5  Smokeless Tobacco Never Used    Goals Met:  Independence with exercise equipment Exercise tolerated well No report of cardiac concerns or symptoms Strength training completed today  Goals Unmet:  Not Applicable  Comments: Pt able to follow exercise prescription today without complaint.  Will continue to monitor for progression.    Dr. Emily Filbert is Medical Director for [facility].

## 2020-07-04 ENCOUNTER — Ambulatory Visit: Payer: Medicare HMO | Admitting: Physical Therapy

## 2020-07-05 ENCOUNTER — Other Ambulatory Visit: Payer: Self-pay

## 2020-07-05 ENCOUNTER — Encounter: Payer: Medicare HMO | Admitting: *Deleted

## 2020-07-05 DIAGNOSIS — Z952 Presence of prosthetic heart valve: Secondary | ICD-10-CM

## 2020-07-05 NOTE — Progress Notes (Signed)
Daily Session Note  Patient Details  Name: Terry [name] MRN: [id number] Date of Birth: 06/04/1951 Referring Provider:     Cardiac Rehab from 05/23/2020 in [facility] Cardiac and Pulmonary Rehab  Referring Provider Isaias Cowman MD      Encounter Date: 07/05/2020  Check In:  Session Check In - 07/05/20 0939      Check-In   Supervising physician immediately available to respond to emergencies See telemetry face sheet for immediately available ER MD    Location [facility]    Staff Present Nyoka Cowden, RN, BSN, MA;Joseph [address] [place], [place], RCEP, CCRP, CCET    Virtual Visit No    Fall or balance concerns reported    No    Tobacco Cessation No Change    Warm-up and Cool-down Performed on first and last piece of equipment    PAD/SET Patient? No      Pain Assessment   Currently in Pain? No/denies              Social History   Tobacco Use  Smoking Status Former Smoker  . Packs/day: 1.50  . Years: 20.00  . Pack years: 30.00  . Types: Cigarettes  . Quit date: 35  . Years since quitting: 36.5  Smokeless Tobacco Never Used    Goals Met:  Independence with exercise equipment Exercise tolerated well No report of cardiac concerns or symptoms Strength training completed today  Goals Unmet:  Not Applicable  Comments: Pt able to follow exercise prescription today without complaint.  Will continue to monitor for progression.   Dr. Emily Filbert is Medical Director for [place] [facility].

## 2020-07-09 ENCOUNTER — Encounter: Payer: Medicare HMO | Admitting: Speech Pathology

## 2020-07-10 ENCOUNTER — Encounter: Payer: Self-pay | Admitting: *Deleted

## 2020-07-10 DIAGNOSIS — Z952 Presence of prosthetic heart valve: Secondary | ICD-10-CM

## 2020-07-10 NOTE — Progress Notes (Signed)
Cardiac Individual Treatment Plan  Patient Details  Name: Terry [name] MRN: [id number] Date of Birth: 1951/09/17 Referring Provider:     Cardiac Rehab from 05/23/2020 in [facility] Cardiac and Pulmonary Rehab  Referring Provider Isaias Cowman MD      Initial Encounter Date:    Cardiac Rehab from 05/23/2020 in [facility] Cardiac and Pulmonary Rehab  Date 05/23/20      Visit Diagnosis: S/P mitral valve replacement  Patient's Home Medications on Admission:  Current Outpatient Medications:  .  amiodarone (PACERONE) 200 MG tablet, Take 1 tablet (200 mg total) by mouth daily., Disp: , Rfl:  .  apixaban (ELIQUIS) 5 MG TABS tablet, Take 1 tablet (5 mg total) by mouth 2 (two) times daily., Disp: 60 tablet, Rfl: 0 .  aspirin EC 81 MG EC tablet, Take 1 tablet (81 mg total) by mouth daily., Disp: 30 tablet, Rfl: 0 .  atorvastatin (LIPITOR) 40 MG tablet, Take 1 tablet (40 mg total) by mouth daily., Disp: 30 tablet, Rfl: 0 .  clobetasol ointment (TEMOVATE) 6.22 %, Apply 1 application topically daily as needed (skin irritation). , Disp: , Rfl:  .  Multiple Vitamin (MULTIVITAMIN WITH MINERALS) TABS tablet, Take 1 tablet by mouth at bedtime., Disp: , Rfl:  .  omeprazole (PRILOSEC) 20 MG capsule, Take 20 mg by mouth every evening. , Disp: , Rfl:  .  ondansetron (ZOFRAN) 4 MG tablet, Take 4 mg by mouth every 8 (eight) hours as needed., Disp: , Rfl:  .  triamcinolone cream (KENALOG) 0.1 %, Apply 1 application topically daily as needed (skin irritation.). , Disp: , Rfl:  .  valACYclovir (VALTREX) 500 MG tablet, Take 500 mg by mouth every evening. , Disp: , Rfl:   Past Medical History: Past Medical History:  Diagnosis Date  . Diverticulosis   . GERD (gastroesophageal reflux disease)   . Heart murmur   . Hypercholesteremia   . Hypertension   . Mitral valve prolapse     Tobacco Use: Social History   Tobacco Use  Smoking Status Former Smoker  . Packs/day: 1.50  . Years: 20.00  . Pack years: 30.00   . Types: Cigarettes  . Quit date: 4  . Years since quitting: 36.5  Smokeless Tobacco Never Used    Labs: Recent Review [facility] for [facility] Cardiac and Pulmonary Rehab Latest Ref Rng & Units 05/08/2020   Cholestrol 0 - 200 mg/dL 230(H)   LDLCALC 0 - 99 mg/dL 172(H)   HDL >40 mg/dL 30(L)   Trlycerides <150 mg/dL 142   Hemoglobin A1c 4.8 - 5.6 % 5.4       Exercise Target Goals: Exercise Program Goal: Individual exercise prescription set using results from initial 6 min walk test and THRR while considering  patient's activity barriers and safety.   Exercise Prescription Goal: Initial exercise prescription builds to 30-45 minutes a day of aerobic activity, 2-3 days per week.  Home exercise guidelines will be given to patient during program as part of exercise prescription that the participant will acknowledge.   Education: Aerobic Exercise & Resistance Training: - Gives group verbal and written instruction on the various components of exercise. Focuses on aerobic and resistive training programs and the benefits of this training and how to safely progress through these programs..   Education: Exercise & Equipment Safety: - Individual verbal instruction and demonstration of equipment use and safety with use of the equipment.   Cardiac Rehab from 05/23/2020 in [facility] Cardiac and Pulmonary Rehab  Date 05/23/20  Educator [facility]  Instruction Review Code 1- Verbalizes Understanding      Education: Exercise Physiology & General Exercise Guidelines: - Group verbal and written instruction with models to review the exercise physiology of the cardiovascular system and associated critical values. Provides general exercise guidelines with specific guidelines to those with heart or lung disease.    Education: Flexibility, Balance, Mind/Body Relaxation: Provides group verbal/written instruction on the benefits of flexibility and balance training, including mind/body exercise modes such  as yoga, pilates and tai chi.  Demonstration and skill practice provided.   Activity Barriers & Risk Stratification:  Activity Barriers & Cardiac Risk Stratification - 05/23/20 1046      Activity Barriers & Cardiac Risk Stratification   Activity Barriers Deconditioning;Muscular Weakness;Other (comment);Joint Problems;Balance Concerns;Incisional Pain    Comments recent CVA, minimal residuals, R rotator cuff tear    Cardiac Risk Stratification High           6 Minute Walk:  6 Minute Walk    Row Name 05/23/20 1044         6 Minute Walk   Phase Initial     Distance 1215 feet     Walk Time 6 minutes     # of Rest Breaks 0     MPH 2.3     METS 2.93     RPE 7     VO2 Peak 10.25     Symptoms No     Resting HR 80 bpm     Resting BP 126/64     Resting Oxygen Saturation  97 %     Exercise Oxygen Saturation  during 6 min walk 99 %     Max Ex. HR 98 bpm     Max Ex. BP 134/70     2 Minute Post BP 110/56            Oxygen Initial Assessment:   Oxygen Re-Evaluation:   Oxygen Discharge (Final Oxygen Re-Evaluation):   Initial Exercise Prescription:  Initial Exercise Prescription - 05/23/20 1000      Date of Initial Exercise RX and Referring Provider   Date 05/23/20    Referring Provider Paraschos, Alexander MD      Treadmill   MPH 2.3    Grade 1    Minutes 15    METs 3.08      Recumbant Bike   Level 2    RPM 50    Watts 23    Minutes 15    METs 3      NuStep   Level 3    SPM 80    Minutes 15    METs 3      Prescription Details   Frequency (times per week) 3    Duration Progress to 30 minutes of continuous aerobic without signs/symptoms of physical distress      Intensity   THRR 40-80% of Max Heartrate 108/136    Ratings of Perceived Exertion 11-13    Perceived Dyspnea 0-4      Progression   Progression Continue to progress workloads to maintain intensity without signs/symptoms of physical distress.      Resistance Training   Training  Prescription Yes    Weight 3 lb    Reps 10-15           Perform Capillary Blood Glucose checks as needed.  Exercise Prescription Changes:  Exercise Prescription Changes    Row Name 05/23/20 1000 06/12/20 1200 06/25/20 1500  Response to Exercise   Blood Pressure (Admit) 126/64 118/62 102/64     Blood Pressure (Exercise) 134/70 120/58 98/58     Blood Pressure (Exit) 110/56 110/68 92/58     Heart Rate (Admit) 80 bpm 89 bpm 82 bpm     Heart Rate (Exercise) 98 bpm 92 bpm 95 bpm     Heart Rate (Exit) 88 bpm 85 bpm 81 bpm     Oxygen Saturation (Admit) 97 % -- --     Oxygen Saturation (Exercise) 99 % -- --     Rating of Perceived Exertion (Exercise) 7 12 13      Symptoms none none none     Comments walk test results -- --     Duration -- Continue with 30 min of aerobic exercise without signs/symptoms of physical distress. Continue with 30 min of aerobic exercise without signs/symptoms of physical distress.     Intensity -- THRR unchanged THRR unchanged       Progression   Progression -- Continue to progress workloads to maintain intensity without signs/symptoms of physical distress. Continue to progress workloads to maintain intensity without signs/symptoms of physical distress.     Average METs -- 2.8 2.98       Resistance Training   Training Prescription -- Yes Yes     Weight -- 5 lb 5 lb     Reps -- 10-15 10-15       Interval Training   Interval Training -- -- No       Treadmill   MPH -- -- 2.5     Grade -- -- 1     Minutes -- -- 15     METs -- -- 3.26       Recumbant Bike   Level -- 2 3     RPM -- 50 --     Watts -- -- 25     Minutes -- 15 15     METs -- 2.91 2.98       NuStep   Level -- 3 5     SPM -- 80 --     Minutes -- 15 15     METs -- 2.6 3.2       Elliptical   Level -- -- 1     Speed -- -- 2.5     Minutes -- -- 15     METs -- -- 2.5            Exercise Comments:   Exercise Goals and Review:  Exercise Goals    Row Name 05/23/20 1052              Exercise Goals   Increase Physical Activity Yes       Intervention Provide advice, education, support and counseling about physical activity/exercise needs.;Develop an individualized exercise prescription for aerobic and resistive training based on initial evaluation findings, risk stratification, comorbidities and participant's personal goals.       Expected Outcomes Short Term: Attend rehab on a regular basis to increase amount of physical activity.;Long Term: Add in home exercise to make exercise part of routine and to increase amount of physical activity.;Long Term: Exercising regularly at least 3-5 days a week.       Increase Strength and Stamina Yes       Intervention Provide advice, education, support and counseling about physical activity/exercise needs.;Develop an individualized exercise prescription for aerobic and resistive training based on initial evaluation findings, risk stratification, comorbidities and participant's personal goals.  Expected Outcomes Short Term: Increase workloads from initial exercise prescription for resistance, speed, and METs.;Short Term: Perform resistance training exercises routinely during rehab and add in resistance training at home;Long Term: Improve cardiorespiratory fitness, muscular endurance and strength as measured by increased METs and functional capacity (6MWT)       Able to understand and use rate of perceived exertion (RPE) scale Yes       Intervention Provide education and explanation on how to use RPE scale       Expected Outcomes Short Term: Able to use RPE daily in rehab to express subjective intensity level;Long Term:  Able to use RPE to guide intensity level when exercising independently       Able to understand and use Dyspnea scale Yes       Intervention Provide education and explanation on how to use Dyspnea scale       Expected Outcomes Short Term: Able to use Dyspnea scale daily in rehab to express subjective sense of  shortness of breath during exertion;Long Term: Able to use Dyspnea scale to guide intensity level when exercising independently       Knowledge and understanding of Target Heart Rate Range (THRR) Yes       Intervention Provide education and explanation of THRR including how the numbers were predicted and where they are located for reference       Expected Outcomes Short Term: Able to state/look up THRR;Short Term: Able to use daily as guideline for intensity in rehab;Long Term: Able to use THRR to govern intensity when exercising independently       Able to check pulse independently Yes       Intervention Provide education and demonstration on how to check pulse in carotid and radial arteries.;Review the importance of being able to check your own pulse for safety during independent exercise       Expected Outcomes Short Term: Able to explain why pulse checking is important during independent exercise;Long Term: Able to check pulse independently and accurately       Understanding of Exercise Prescription Yes       Intervention Provide education, explanation, and written materials on patient's individual exercise prescription       Expected Outcomes Short Term: Able to explain program exercise prescription;Long Term: Able to explain home exercise prescription to exercise independently              Exercise Goals Re-Evaluation :  Exercise Goals Re-Evaluation    Row Name 06/12/20 1247 06/17/20 1043 06/25/20 1538         Exercise Goal Re-Evaluation   Exercise Goals Review Increase Physical Activity;Increase Strength and Stamina;Able to understand and use rate of perceived exertion (RPE) scale;Able to understand and use Dyspnea scale;Knowledge and understanding of Target Heart Rate Range (THRR) Increase Physical Activity;Increase Strength and Stamina;Able to understand and use rate of perceived exertion (RPE) scale;Able to understand and use Dyspnea scale;Knowledge and understanding of Target Heart  Rate Range (THRR);Understanding of Exercise Prescription;Able to check pulse independently Increase Physical Activity;Increase Strength and Stamina;Understanding of Exercise Prescription     Comments Terry [name] is doing well so far.  He tried th eelliptical today with no issues and has moved to 5 lb weights.  Staff will monitor progress. Reviewed home exercise with pt today.  Pt plans to walk at home and go to [employer] for exercise.  Reviewed THR, pulse, RPE, sign and symptoms, pulse oximetery and when to call 911 or MD.  Also discussed weather considerations  and indoor options.  Pt voiced understanding. Terry [name] is doing well in rehab.  He is up to 25 watts on the bike and level 5 on the NuStep.  He has even taken on the elliptical.  We will continue to monitor his progress.     Expected Outcomes Short:  attend class consistently Long: improve MET level Short: Continue to add in more exercise Long: Continue to improve stamina. Short: Increase resistance on bike Long: Continue to improve strength            Discharge Exercise Prescription (Final Exercise Prescription Changes):  Exercise Prescription Changes - 06/25/20 1500      Response to Exercise   Blood Pressure (Admit) 102/64    Blood Pressure (Exercise) 98/58    Blood Pressure (Exit) 92/58    Heart Rate (Admit) 82 bpm    Heart Rate (Exercise) 95 bpm    Heart Rate (Exit) 81 bpm    Rating of Perceived Exertion (Exercise) 13    Symptoms none    Duration Continue with 30 min of aerobic exercise without signs/symptoms of physical distress.    Intensity THRR unchanged      Progression   Progression Continue to progress workloads to maintain intensity without signs/symptoms of physical distress.    Average METs 2.98      Resistance Training   Training Prescription Yes    Weight 5 lb    Reps 10-15      Interval Training   Interval Training No      Treadmill   MPH 2.5    Grade 1    Minutes 15    METs 3.26      Recumbant Bike    Level 3    Watts 25    Minutes 15    METs 2.98      NuStep   Level 5    Minutes 15    METs 3.2      Elliptical   Level 1    Speed 2.5    Minutes 15    METs 2.5           Nutrition:  Target Goals: Understanding of nutrition guidelines, daily intake of sodium <1566m, cholesterol <2053m calories 30% from fat and 7% or less from saturated fats, daily to have 5 or more servings of fruits and vegetables.  Education: Controlling Sodium/Reading Food Labels -Group verbal and written material supporting the discussion of sodium use in heart healthy nutrition. Review and explanation with models, verbal and written materials for utilization of the food label.   Education: General Nutrition Guidelines/Fats and Fiber: -Group instruction provided by verbal, written material, models and posters to present the general guidelines for heart healthy nutrition. Gives an explanation and review of dietary fats and fiber.   Biometrics:  Pre Biometrics - 05/23/20 1052      Pre Biometrics   Height 5' 9.6" (1.768 m)    Weight 175 lb (79.4 kg)    BMI (Calculated) 25.39    Single Leg Stand 3.28 seconds            Nutrition Therapy Plan and Nutrition Goals:   Nutrition Assessments:  Nutrition Assessments - 05/23/20 1054      MEDFICTS Scores   Pre Score 93           MEDIFICTS Score Key:          ?70 Need to make dietary changes          40-70 Heart Healthy Diet         ?  40 Therapeutic Level Cholesterol Diet  Nutrition Goals Re-Evaluation:   Nutrition Goals Discharge (Final Nutrition Goals Re-Evaluation):   Psychosocial: Target Goals: Acknowledge presence or absence of significant depression and/or stress, maximize coping skills, provide positive support system. Participant is able to verbalize types and ability to use techniques and skills needed for reducing stress and depression.   Education: Depression - Provides group verbal and written instruction on the correlation  between heart/lung disease and depressed mood, treatment options, and the stigmas associated with seeking treatment.   Education: Sleep Hygiene -Provides group verbal and written instruction about how sleep can affect your health.  Define sleep hygiene, discuss sleep cycles and impact of sleep habits. Review good sleep hygiene tips.     Education: Stress and Anxiety: - Provides group verbal and written instruction about the health risks of elevated stress and causes of high stress.  Discuss the correlation between heart/lung disease and anxiety and treatment options. Review healthy ways to manage with stress and anxiety.    Initial Review & Psychosocial Screening:  Initial Psych Review & Screening - 05/21/20 1254      Initial Review   Current issues with None Identified;Current Stress Concerns    Source of Stress Concerns Chronic Illness    Comments He has had two strokes within a week. He can look to his wife and son for support. He has a positive outlook on his health currenty.      Family Dynamics   Good Support System? Yes      Barriers   Psychosocial barriers to participate in program There are no identifiable barriers or psychosocial needs.;The patient should benefit from training in stress management and relaxation.;Psychosocial barriers identified (see note)      Screening Interventions   Interventions To provide support and resources with identified psychosocial needs;Provide feedback about the scores to participant;Encouraged to exercise    Expected Outcomes Short Term goal: Utilizing psychosocial counselor, staff and physician to assist with identification of specific Stressors or current issues interfering with healing process. Setting desired goal for each stressor or current issue identified.;Long Term Goal: Stressors or current issues are controlled or eliminated.;Short Term goal: Identification and review with participant of any Quality of Life or Depression concerns  found by scoring the questionnaire.;Long Term goal: The participant improves quality of Life and PHQ9 Scores as seen by post scores and/or verbalization of changes           Quality of Life Scores:   Quality of Life - 05/23/20 1054      Quality of Life   Select Quality of Life      Quality of Life Scores   Health/Function Pre 7.4 %    Socioeconomic Pre 14.79 %    Psych/Spiritual Pre 13.79 %    Family Pre 14.4 %    GLOBAL Pre 11.26 %          Scores of 19 and below usually indicate a poorer quality of life in these areas.  A difference of  2-3 points is a clinically meaningful difference.  A difference of 2-3 points in the total score of the Quality of Life Index has been associated with significant improvement in overall quality of life, self-image, physical symptoms, and general health in studies assessing change in quality of life.  PHQ-9: Recent Review Flowsheet Data    Depression screen [facility] 2/9 06/24/2020 05/23/2020   Decreased Interest 0 2   Down, Depressed, Hopeless 1 1   PHQ - 2 Score 1  3   Altered sleeping 0 1   Tired, decreased energy - 1   Change in appetite 1 2   Feeling bad or failure about yourself  0 1   Trouble concentrating 0 1   Moving slowly or fidgety/restless 0 2   Suicidal thoughts 0 0   PHQ-9 Score 2 11   Difficult doing work/chores Not difficult at all Somewhat difficult     Interpretation of Total Score  Total Score Depression Severity:  1-4 = Minimal depression, 5-9 = Mild depression, 10-14 = Moderate depression, 15-19 = Moderately severe depression, 20-27 = Severe depression   Psychosocial Evaluation and Intervention:  Psychosocial Evaluation - 05/21/20 1306      Psychosocial Evaluation & Interventions   Interventions Encouraged to exercise with the program and follow exercise prescription    Comments He has had two strokes within a week. He can look to his wife and son for support. He has a positive outlook on his health currenty.     Expected Outcomes Short: Start [facility] Long: Continue to attend Rehab to help with patients mental state.    Continue Psychosocial Services  Follow up required by staff           Psychosocial Re-Evaluation:  Psychosocial Re-Evaluation    Terry [name] Name 06/17/20 1024 06/24/20 1013           Psychosocial Re-Evaluation   Current issues with None Identified None Identified      Comments Terry [name] is off to a good start in rehab.  He is feeling good metally. He is mostly a positive person.  He usually sleeps well as well.  He falls asleep easily and sleeps well through the night.  He is still recovereing some from his strokes, but good overall. Reviewed patient health questionnaire (PHQ-9) with patient for follow up. Previously, patients score indicated signs/symptoms of depression.  Reviewed to see if patient is improving symptom wise while in program.  Score improved and patient states that it is because he has been able to et out more and workout at rehab. He like to interact and meet new people.      Expected Outcomes Short: Continue to stay positive.  Long: Continue to exercise. Short: Continue to attend [facility] regularly for regular exercise and social engagement. Long: Continue to improve symptoms and manage a positive mental state.      Interventions Encouraged to attend Cardiac Rehabilitation for the exercise Encouraged to attend Cardiac Rehabilitation for the exercise      Continue Psychosocial Services  Follow up required by staff Follow up required by staff             Psychosocial Discharge (Final Psychosocial Re-Evaluation):  Psychosocial Re-Evaluation - 06/24/20 1013      Psychosocial Re-Evaluation   Current issues with None Identified    Comments Reviewed patient health questionnaire (PHQ-9) with patient for follow up. Previously, patients score indicated signs/symptoms of depression.  Reviewed to see if patient is improving symptom wise while in program.  Score improved and  patient states that it is because he has been able to et out more and workout at rehab. He like to interact and meet new people.    Expected Outcomes Short: Continue to attend [facility] regularly for regular exercise and social engagement. Long: Continue to improve symptoms and manage a positive mental state.    Interventions Encouraged to attend Cardiac Rehabilitation for the exercise    Continue Psychosocial Services  Follow up required by staff  Vocational Rehabilitation: Provide vocational rehab assistance to qualifying candidates.   Vocational Rehab Evaluation & Intervention:   Education: Education Goals: Education classes will be provided on a variety of topics geared toward better understanding of heart health and risk factor modification. Participant will state understanding/return demonstration of topics presented as noted by education test scores.  Learning Barriers/Preferences:  Learning Barriers/Preferences - 05/21/20 1258      Learning Barriers/Preferences   Learning Barriers None    Learning Preferences None           General Cardiac Education Topics:  AED/CPR: - Group verbal and written instruction with the use of models to demonstrate the basic use of the AED with the basic ABC's of resuscitation.   Anatomy & Physiology of the Heart: - Group verbal and written instruction and models provide basic cardiac anatomy and physiology, with the coronary electrical and arterial systems. Review of Valvular disease and Heart Failure   Cardiac Procedures: - Group verbal and written instruction to review commonly prescribed medications for heart disease. Reviews the medication, class of the drug, and side effects. Includes the steps to properly store meds and maintain the prescription regimen. (beta blockers and nitrates)   Cardiac Medications I: - Group verbal and written instruction to review commonly prescribed medications for heart disease. Reviews the  medication, class of the drug, and side effects. Includes the steps to properly store meds and maintain the prescription regimen.   Cardiac Medications II: -Group verbal and written instruction to review commonly prescribed medications for heart disease. Reviews the medication, class of the drug, and side effects. (all other drug classes)    Go Sex-Intimacy & Heart Disease, Get SMART - Goal Setting: - Group verbal and written instruction through game format to discuss heart disease and the return to sexual intimacy. Provides group verbal and written material to discuss and apply goal setting through the application of the S.M.A.R.T. Method.   Other Matters of the Heart: - Provides group verbal, written materials and models to describe Stable Angina and Peripheral Artery. Includes description of the disease process and treatment options available to the cardiac patient.   Infection Prevention: - Provides verbal and written material to individual with discussion of infection control including proper hand washing and proper equipment cleaning during exercise session.   Cardiac Rehab from 05/23/2020 in [facility] Cardiac and Pulmonary Rehab  Date 05/23/20  Educator [facility]  Instruction Review Code 1- Verbalizes Understanding      Falls Prevention: - Provides verbal and written material to individual with discussion of falls prevention and safety.   Cardiac Rehab from 05/23/2020 in [facility] Cardiac and Pulmonary Rehab  Date 05/23/20  Educator [facility]  Instruction Review Code 1- Verbalizes Understanding      Other: -Provides group and verbal instruction on various topics (see comments)   Knowledge Questionnaire Score:  Knowledge Questionnaire Score - 05/23/20 1054      Knowledge Questionnaire Score   Pre Score 25/26 Education Focus: Nutrition           Core Components/Risk Factors/Patient Goals at Admission:  Personal Goals and Risk Factors at Admission - 05/23/20 1055      Core  Components/Risk Factors/Patient Goals on Admission    Weight Management Yes;Weight Loss    Intervention Weight Management: Develop a combined nutrition and exercise program designed to reach desired caloric intake, while maintaining appropriate intake of nutrient and fiber, sodium and fats, and appropriate energy expenditure required for the weight goal.;Weight Management: Provide education and appropriate resources  to help participant work on and attain dietary goals.    Admit Weight 175 lb (79.4 kg)    Goal Weight: Short Term 170 lb (77.1 kg)    Goal Weight: Long Term 165 lb (74.8 kg)    Expected Outcomes Short Term: Continue to assess and modify interventions until short term weight is achieved;Long Term: Adherence to nutrition and physical activity/exercise program aimed toward attainment of established weight goal;Understanding recommendations for meals to include 15-35% energy as protein, 25-35% energy from fat, 35-60% energy from carbohydrates, less than 245m of dietary cholesterol, 20-35 gm of total fiber daily;Understanding of distribution of calorie intake throughout the day with the consumption of 4-5 meals/snacks;Weight Loss: Understanding of general recommendations for a balanced deficit meal plan, which promotes 1-2 lb weight loss per week and includes a negative energy balance of [phone] kcal/d    Hypertension Yes    Intervention Provide education on lifestyle modifcations including regular physical activity/exercise, weight management, moderate sodium restriction and increased consumption of fresh fruit, vegetables, and low fat dairy, alcohol moderation, and smoking cessation.;Monitor prescription use compliance.    Expected Outcomes Short Term: Continued assessment and intervention until BP is < 140/933mHG in hypertensive participants. < 130/8023mG in hypertensive participants with diabetes, heart failure or chronic kidney disease.;Long Term: Maintenance of blood pressure at goal  levels.    Lipids Yes    Intervention Provide education and support for participant on nutrition & aerobic/resistive exercise along with prescribed medications to achieve LDL <84m61mDL >40mg65m Expected Outcomes Short Term: Participant states understanding of desired cholesterol values and is compliant with medications prescribed. Participant is following exercise prescription and nutrition guidelines.;Long Term: Cholesterol controlled with medications as prescribed, with individualized exercise RX and with personalized nutrition plan. Value goals: LDL < 84mg,84m > 40 mg.           Education:Diabetes - Individual verbal and written instruction to review signs/symptoms of diabetes, desired ranges of glucose level fasting, after meals and with exercise. Acknowledge that pre and post exercise glucose checks will be done for 3 sessions at entry of program.   Education: Know Your Numbers and Risk Factors: -Group verbal and written instruction about important numbers in your health.  Discussion of what are risk factors and how they play a role in the disease process.  Review of Cholesterol, Blood Pressure, Diabetes, and BMI and the role they play in your overall health.   Core Components/Risk Factors/Patient Goals Review:   Goals and Risk Factor Review    Row Name 06/17/20 1025             Core Components/Risk Factors/Patient Goals Review   Personal Goals Review Weight Management/Obesity;Hypertension;Lipids       Review Ghali Oleing well in rehab.  His weight is still coming down some but he would like to continue to build muscle back up. His pressures have been good in class, but he doesn't check them at home.  He was encouraged to get into routine of checking them.  Overall he is doing well.       Expected Outcomes Short: Start tracking pressures  Long; Continue to monitor risk factors.              Core Components/Risk Factors/Patient Goals at Discharge (Final Review):   Goals  and Risk Factor Review - 06/17/20 1025      Core Components/Risk Factors/Patient Goals Review   Personal Goals Review Weight Management/Obesity;Hypertension;Lipids    Review Terry [name]  is doing well in rehab.  His weight is still coming down some but he would like to continue to build muscle back up. His pressures have been good in class, but he doesn't check them at home.  He was encouraged to get into routine of checking them.  Overall he is doing well.    Expected Outcomes Short: Start tracking pressures  Long; Continue to monitor risk factors.           ITP Comments:  ITP Comments    Row Name 05/21/20 1303 05/23/20 1044 06/12/20 0644 07/10/20 1108     ITP Comments Virtual Visit completed. Patient informed on EP and RD appointment and 6 Minute walk test. Patient also informed of patient health questionnaires on My Chart. Patient Verbalizes understanding. Visit diagnosis can be found in [facility] 04/11/2020. Completed 6MWT and gym orientation.  Initial ITP created and sent for review to Dr. Emily Filbert, Medical Director. 30 Day review completed. Medical Director ITP review done, changes made as directed, and signed approval by Medical Director. 30 Day review completed. Medical Director ITP review done, changes made as directed, and signed approval by Medical Director.           Comments:

## 2020-07-11 ENCOUNTER — Encounter: Payer: Medicare HMO | Admitting: Speech Pathology

## 2020-07-15 ENCOUNTER — Other Ambulatory Visit: Payer: Self-pay

## 2020-07-15 ENCOUNTER — Encounter: Payer: Medicare HMO | Admitting: *Deleted

## 2020-07-15 DIAGNOSIS — Z952 Presence of prosthetic heart valve: Secondary | ICD-10-CM | POA: Diagnosis not present

## 2020-07-15 NOTE — Progress Notes (Signed)
Daily Session Note  Patient Details  Name: Terry [name] MRN: [id number] Date of Birth: September 07, 1951 Referring Provider:     Cardiac Rehab from 05/23/2020 in [facility] Cardiac and Pulmonary Rehab  Referring Provider Isaias Cowman MD      Encounter Date: 07/15/2020  Check In:  Session Check In - 07/15/20 1009      Check-In   Supervising physician immediately available to respond to emergencies See telemetry face sheet for immediately available ER MD    Location [facility]-Cardiac & Pulmonary Rehab    Staff Present Nyoka Cowden, RN, BSN, [address] [place], BS, ACSM CEP, Exercise Physiologist    Virtual Visit No    Medication changes reported     No    Fall or balance concerns reported    No    Tobacco Cessation No Change    Warm-up and Cool-down Performed on first and last piece of equipment    Resistance Training Performed Yes    VAD Patient? No    PAD/SET Patient? No      Pain Assessment   Currently in Pain? No/denies              Social History   Tobacco Use  Smoking Status Former Smoker  . Packs/day: 1.50  . Years: 20.00  . Pack years: 30.00  . Types: Cigarettes  . Quit date: 25  . Years since quitting: 36.5  Smokeless Tobacco Never Used    Goals Met:  Independence with exercise equipment Exercise tolerated well No report of cardiac concerns or symptoms Strength training completed today  Goals Unmet:  Not Applicable  Comments: Pt able to follow exercise prescription today without complaint.  Will continue to monitor for progression.    Dr. Emily Filbert is Medical Director for [place] and [facility].

## 2020-07-15 NOTE — Progress Notes (Signed)
Daily Session Note  Patient Details  Name: Terry [name] MRN: [id number] Date of Birth: 02/27/1951 Referring Provider:     Cardiac Rehab from 05/23/2020 in [facility] Cardiac and Pulmonary Rehab  Referring Provider Paraschos, Alexander MD      Encounter Date: 07/15/2020  Check In:      Social History   Tobacco Use  Smoking Status Former Smoker  . Packs/day: 1.50  . Years: 20.00  . Pack years: 30.00  . Types: Cigarettes  . Quit date: 1985  . Years since quitting: 36.5  Smokeless Tobacco Never Used    Goals Met:  Independence with exercise equipment Exercise tolerated well No report of cardiac concerns or symptoms Strength training completed today  Goals Unmet:  Not Applicable  Comments: Pt able to follow exercise prescription today without complaint.  Will continue to monitor for progression.    Dr. Mark Miller is Medical Director for [facility] and [facility]. 

## 2020-07-16 ENCOUNTER — Ambulatory Visit: Payer: Medicare HMO | Admitting: Speech Pathology

## 2020-07-16 ENCOUNTER — Encounter: Payer: Self-pay | Admitting: Speech Pathology

## 2020-07-16 ENCOUNTER — Other Ambulatory Visit: Payer: Self-pay

## 2020-07-16 ENCOUNTER — Ambulatory Visit: Payer: Medicare HMO | Admitting: Physical Therapy

## 2020-07-16 DIAGNOSIS — R471 Dysarthria and anarthria: Secondary | ICD-10-CM

## 2020-07-16 NOTE — Therapy (Signed)
[place] MAIN [facility] SERVICES [place], [place], [address] Phone: [phone]   Fax:  [phone]  Speech Language Pathology Treatment Speech Therapy Progress Note  Speech Therapy Progress Note   Dates of reporting period  05/22/2020   to   07/16/2020   Patient Details  Name: Terry [name] MRN: [id number] Date of Birth: June 10, 1951 Referring Provider (SLP): Dr. Wynetta Emery   Encounter Date: 07/16/2020   End of Session - 07/16/20 1553    Visit Number 10    Number of Visits 14    Date for SLP Re-Evaluation 07/19/20    Authorization Type Medicare    Authorization Time Period Start 05/22/2020    Authorization - Visit Number 10    Authorization - Number of Visits 10    Progress Note Due on Visit 10    SLP Start Time 0250    SLP Stop Time  0340    SLP Time Calculation (min) 50 min    Activity Tolerance Patient tolerated treatment well           Past Medical History:  Diagnosis Date  . Diverticulosis   . GERD (gastroesophageal reflux disease)   . Heart murmur   . Hypercholesteremia   . Hypertension   . Mitral valve prolapse     Past Surgical History:  Procedure Laterality Date  . COLONOSCOPY WITH PROPOFOL N/A 03/06/2020   Procedure: COLONOSCOPY WITH PROPOFOL;  Surgeon: Toledo, Benay Pike, MD;  Location: [facility] ENDOSCOPY;  Service: Gastroenterology;  Laterality: N/A;  . MITRAL VALVE REPLACEMENT    . RETINAL DETACHMENT SURGERY    . RIGHT/LEFT HEART CATH AND CORONARY ANGIOGRAPHY Bilateral 02/18/2017   Procedure: Right/Left Heart Cath and Coronary Angiography;  Surgeon: Isaias Cowman, MD;  Location: [place] CV LAB;  Service: Cardiovascular;  Laterality: Bilateral;  . RIGHT/LEFT HEART CATH AND CORONARY ANGIOGRAPHY N/A 02/22/2020   Procedure: RIGHT/LEFT HEART CATH AND CORONARY ANGIOGRAPHY;  Surgeon: Isaias Cowman, MD;  Location: [place] CV LAB;  Service: Cardiovascular;  Laterality: N/A;  . TEMPOROMANDIBULAR  JOINT SURGERY      There were no vitals filed for this visit.   Subjective Assessment - 07/16/20 1553    Subjective Talkative, willing to participate                 ADULT SLP TREATMENT - 07/16/20 0001      General Information   Behavior/Cognition Alert;Cooperative;Pleasant mood      Treatment Provided   Treatment provided Cognitive-Linquistic      Pain Assessment   Pain Assessment No/denies pain      Cognitive-Linquistic Treatment   Treatment focused on Cognition    Skilled Treatment EXPRESSION: Patient told stories about his vacation and was 95% intelligible in conversational speech. Read aloud Harvard sentences at 85% accuracy. Answered general information questions with 95% intelligible speech.      Assessment / Recommendations / Plan   Plan Continue with current plan of care      Progression Toward Goals   Progression toward goals Progressing toward goals            SLP Education - 07/16/20 1553    Education Details Clear speech    Person(s) Educated Patient    Methods Explanation    Comprehension Verbalized understanding              SLP Long Term Goals - 07/16/20 1633      SLP LONG TERM GOAL #1   Title Pt will improve  speech intelligibility for sentences and multi-syllabic words by controlling rate of speech, over-articulation, and increased loudness to achieve 80% intelligibility with min. SLP cues.    Status Partially Met    Target Date 07/19/20      SLP LONG TERM GOAL #2   Title Patient will complete high level word finding tasks with 80% accuracy.    Status Partially Met    Target Date 07/19/20      SLP LONG TERM GOAL #3   Title Patient will generate grammatical, fluent, and cogent sentences to complete abstract/complex linguistic task with 80% accuracy.    Status Partially Met    Target Date 07/19/20            Plan - 07/16/20 1553    Clinical Impression Statement Patient is highly intelligible at the conversational level. He  does express more difficulty with word finding and intelligibility when he is in higher pressure situations (e.g. talking to someone he is not as comfortable with, talking about a subject he is less confident in). Also expresses vocal fatigue as the day goes on.    Speech Therapy Frequency 2x / week    Duration Other (comment)    Treatment/Interventions Language facilitation;SLP instruction and feedback;Patient/family education    Potential to Achieve Goals Good    Potential Considerations Ability to learn/carryover information;Previous level of function;Co-morbidities;Severity of impairments;Cooperation/participation level;Medical prognosis;Family/community support           Patient will benefit from skilled therapeutic intervention in order to improve the following deficits and impairments:   Dysarthria and anarthria    Problem List Patient Active Problem List   Diagnosis Date Noted  . Chronic anticoagulation 05/11/2020  . Acute CVA (cerebrovascular accident) ([place]) 05/08/2020  . Hypercholesteremia   . Hypertension   . Stroke ([place])   . History of mitral valve replacement   . CKD (chronic kidney disease), stage III   . Leg pain 09/27/2017  . 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 Venous insufficiency 09/27/2017  . GERD (gastroesophageal reflux disease) 09/27/2017    Terry [name], Student Intern 07/16/2020, 4:34 PM  [place] MAIN [facility] SERVICES [address] [place], [place], [address] Phone: [phone]   Fax:  [phone]   Name: Terry [name] MRN: [id number] Date of Birth: [birth year]

## 2020-07-17 ENCOUNTER — Other Ambulatory Visit: Payer: Self-pay

## 2020-07-17 ENCOUNTER — Encounter: Payer: Medicare HMO | Admitting: *Deleted

## 2020-07-17 DIAGNOSIS — Z952 Presence of prosthetic heart valve: Secondary | ICD-10-CM | POA: Diagnosis not present

## 2020-07-17 NOTE — Progress Notes (Signed)
Daily Session Note  Patient Details  Name: DESEAN HEEMSTRA MRN: [id number] Date of Birth: 10-01-1951 Referring Provider:     Cardiac Rehab from 05/23/2020 in [facility] Cardiac and Pulmonary Rehab  Referring Provider Isaias Cowman MD      Encounter Date: 07/17/2020  Check In:  Session Check In - 07/17/20 1007      Check-In   Supervising physician immediately available to respond to emergencies See telemetry face sheet for immediately available ER MD    Location [facility]-Cardiac & Pulmonary Rehab    Staff Present Renita Papa, RN Margurite Auerbach, MS Exercise Physiologist;Amanda Oletta Darter, BA, ACSM CEP, Exercise Physiologist    Virtual Visit No    Medication changes reported     No    Fall or balance concerns reported    No    Warm-up and Cool-down Performed on first and last piece of equipment    Resistance Training Performed Yes    VAD Patient? No    PAD/SET Patient? No      Pain Assessment   Currently in Pain? No/denies              Social History   Tobacco Use  Smoking Status Former Smoker  . Packs/day: 1.50  . Years: 20.00  . Pack years: 30.00  . Types: Cigarettes  . Quit date: 20  . Years since quitting: 36.5  Smokeless Tobacco Never Used    Goals Met:  Independence with exercise equipment Exercise tolerated well No report of cardiac concerns or symptoms Strength training completed today  Goals Unmet:  Not Applicable  Comments: Pt able to follow exercise prescription today without complaint.  Will continue to monitor for progression.    Dr. Emily Filbert is Medical Director for [place] and [facility].

## 2020-07-18 ENCOUNTER — Ambulatory Visit: Payer: Medicare HMO | Admitting: Speech Pathology

## 2020-07-18 ENCOUNTER — Ambulatory Visit: Payer: Medicare HMO | Admitting: Physical Therapy

## 2020-07-18 ENCOUNTER — Encounter: Payer: Self-pay | Admitting: Speech Pathology

## 2020-07-18 ENCOUNTER — Other Ambulatory Visit: Payer: Self-pay

## 2020-07-18 DIAGNOSIS — R471 Dysarthria and anarthria: Secondary | ICD-10-CM

## 2020-07-18 NOTE — Therapy (Signed)
[place] MAIN [facility] SERVICES [address] [place], [place], [address] Phone: [phone]   Fax:  [phone]  Speech Language Pathology Treatment  Patient Details  Name: Terry [name] MRN: [id number] Date of Birth: 1951/11/06 Referring Provider (SLP): Dr. Wynetta Emery   Encounter Date: 07/18/2020   End of Session - 07/18/20 1609    Visit Number 11    Number of Visits 14    Date for SLP Re-Evaluation 07/19/20    Authorization Type Medicare    Authorization Time Period Start 05/22/2020    Authorization - Visit Number 1    Authorization - Number of Visits 10    Progress Note Due on Visit 10    SLP Start Time 0255    SLP Stop Time  0340    SLP Time Calculation (min) 45 min    Activity Tolerance Patient tolerated treatment well           Past Medical History:  Diagnosis Date   Diverticulosis    GERD (gastroesophageal reflux disease)    Heart murmur    Hypercholesteremia    Hypertension    Mitral valve prolapse     Past Surgical History:  Procedure Laterality Date   COLONOSCOPY WITH PROPOFOL N/A 03/06/2020   Procedure: COLONOSCOPY WITH PROPOFOL;  Surgeon: Toledo, Benay Pike, MD;  Location: [facility] ENDOSCOPY;  Service: Gastroenterology;  Laterality: N/A;   MITRAL VALVE REPLACEMENT     RETINAL DETACHMENT SURGERY     RIGHT/LEFT HEART CATH AND CORONARY ANGIOGRAPHY Bilateral 02/18/2017   Procedure: Right/Left Heart Cath and Coronary Angiography;  Surgeon: Isaias Cowman, MD;  Location: [place] CV LAB;  Service: Cardiovascular;  Laterality: Bilateral;   RIGHT/LEFT HEART CATH AND CORONARY ANGIOGRAPHY N/A 02/22/2020   Procedure: RIGHT/LEFT HEART CATH AND CORONARY ANGIOGRAPHY;  Surgeon: Isaias Cowman, MD;  Location: [place] CV LAB;  Service: Cardiovascular;  Laterality: N/A;   TEMPOROMANDIBULAR JOINT SURGERY      There were no vitals filed for this visit.   Subjective Assessment - 07/18/20 1609    Subjective  Attentive, willing to participate                 ADULT SLP TREATMENT - 07/18/20 0001      General Information   Behavior/Cognition Alert;Cooperative;Pleasant mood      Treatment Provided   Treatment provided Cognitive-Linquistic      Pain Assessment   Pain Assessment No/denies pain      Cognitive-Linquistic Treatment   Treatment focused on Cognition    Skilled Treatment EXPRESSION: Described pictures in detail using clear speech with several reminders to slow rate of speech. Read aloud Harvard sentences. Engaged in casual conversation regarding speech as well as other things. Overall intelligibility: 90%.       Assessment / Recommendations / Plan   Plan Continue with current plan of care      Progression Toward Goals   Progression toward goals Progressing toward goals            SLP Education - 07/18/20 1609    Education Details Rate of speech    Person(s) Educated Patient    Methods Explanation    Comprehension Verbalized understanding              SLP Long Term Goals - 07/16/20 1633      SLP LONG TERM GOAL #1   Title Pt will improve speech intelligibility for sentences and multi-syllabic words by controlling rate of speech, over-articulation, and increased loudness to  achieve 80% intelligibility with min. SLP cues.    Status Partially Met    Target Date 07/19/20      SLP LONG TERM GOAL #2   Title Patient will complete high level word finding tasks with 80% accuracy.    Status Partially Met    Target Date 07/19/20      SLP LONG TERM GOAL #3   Title Patient will generate grammatical, fluent, and cogent sentences to complete abstract/complex linguistic task with 80% accuracy.    Status Partially Met    Target Date 07/19/20            Plan - 07/18/20 1610    Clinical Impression Statement Patient is showing improvement in overall intelligibility. He has also slowed his rate of speech and requires less frequent reminders. Will continue to work on  articulation and clarity of speech.    Speech Therapy Frequency 2x / week    Duration Other (comment)    Treatment/Interventions Language facilitation;SLP instruction and feedback;Patient/family education    Potential to Achieve Goals Good    Potential Considerations Ability to learn/carryover information;Previous level of function;Co-morbidities;Severity of impairments;Cooperation/participation level;Medical prognosis;Family/community support           Patient will benefit from skilled therapeutic intervention in order to improve the following deficits and impairments:   Dysarthria and anarthria    Problem List Patient Active Problem List   Diagnosis Date Noted   Chronic anticoagulation 05/11/2020   Acute CVA (cerebrovascular accident) ([place]) 05/08/2020   Hypercholesteremia    Hypertension    Stroke [facility])    History of mitral valve replacement    CKD (chronic kidney disease), stage III    Leg pain 09/27/2017   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   Venous insufficiency 09/27/2017   GERD (gastroesophageal reflux disease) 09/27/2017    Maylon Cos, Student Intern 07/18/2020, 4:10 PM  [place] [address] [place], [place], [address] Phone: [phone]   Fax:  [phone]   Name: Terry [name] MRN: [id number] Date of Birth: 02-05-1951

## 2020-07-19 ENCOUNTER — Other Ambulatory Visit: Payer: Self-pay

## 2020-07-19 ENCOUNTER — Encounter: Payer: Medicare HMO | Admitting: *Deleted

## 2020-07-19 DIAGNOSIS — Z952 Presence of prosthetic heart valve: Secondary | ICD-10-CM

## 2020-07-19 NOTE — Progress Notes (Signed)
Daily Session Note  Patient Details  Name: Terry [name] MRN: [id number] Date of Birth: [birth year] Referring Provider:     Cardiac Rehab from 05/23/2020 in [facility] Cardiac and Pulmonary Rehab  Referring Provider Isaias Cowman MD      Encounter Date: 07/19/2020  Check In:  Session Check In - 07/19/20 0955      Check-In   Supervising physician immediately available to respond to emergencies See telemetry face sheet for immediately available ER MD    Location [facility]-Cardiac & Pulmonary Rehab    Staff Present Renita Papa, RN BSN;Melissa Caiola RDN, LDN;Jessica Luan Pulling, MA, RCEP, CCRP, CCET    Virtual Visit No    Medication changes reported     No    Fall or balance concerns reported    No    Warm-up and Cool-down Performed on first and last piece of equipment    Resistance Training Performed Yes    VAD Patient? No    PAD/SET Patient? No      Pain Assessment   Currently in Pain? No/denies              Social History   Tobacco Use  Smoking Status Former Smoker  . Packs/day: 1.50  . Years: 20.00  . Pack years: 30.00  . Types: Cigarettes  . Quit date: 39  . Years since quitting: 36.5  Smokeless Tobacco Never Used    Goals Met:  Independence with exercise equipment Exercise tolerated well No report of cardiac concerns or symptoms Strength training completed today  Goals Unmet:  Not Applicable  Comments: Pt able to follow exercise prescription today without complaint.  Will continue to monitor for progression.    Dr. Emily Filbert is Medical Director for [place] and [facility].

## 2020-07-22 ENCOUNTER — Encounter: Payer: Medicare HMO | Admitting: *Deleted

## 2020-07-22 ENCOUNTER — Other Ambulatory Visit: Payer: Self-pay

## 2020-07-22 DIAGNOSIS — Z952 Presence of prosthetic heart valve: Secondary | ICD-10-CM | POA: Diagnosis not present

## 2020-07-22 NOTE — Progress Notes (Signed)
Daily Session Note  Patient Details  Name: Terry [name] MRN: [id number] Date of Birth: June 17, 1951 Referring Provider:     Cardiac Rehab from 05/23/2020 in [facility] Cardiac and Pulmonary Rehab  Referring Provider Isaias Cowman MD      Encounter Date: 07/22/2020  Check In:  Session Check In - 07/22/20 1030      Check-In   Supervising physician immediately available to respond to emergencies See telemetry face sheet for immediately available ER MD    Location [facility]-Cardiac & Pulmonary Rehab    Staff Present Renita Papa, RN BSN;Joseph Foy [place], [place], ACSM CEP, Exercise Physiologist;Kelly Amedeo Plenty, BS, ACSM CEP, Exercise Physiologist    Virtual Visit No    Medication changes reported     No    Fall or balance concerns reported    No    Warm-up and Cool-down Performed on first and last piece of equipment    Resistance Training Performed Yes    VAD Patient? No    PAD/SET Patient? No      Pain Assessment   Currently in Pain? No/denies              Social History   Tobacco Use  Smoking Status Former Smoker  . Packs/day: 1.50  . Years: 20.00  . Pack years: 30.00  . Types: Cigarettes  . Quit date: 38  . Years since quitting: 36.5  Smokeless Tobacco Never Used    Goals Met:  Independence with exercise equipment Exercise tolerated well No report of cardiac concerns or symptoms Strength training completed today  Goals Unmet:  Not Applicable  Comments: First full day of exercise!  Patient was oriented to gym and equipment including functions, settings, policies, and procedures.  Patient's individual exercise prescription and treatment plan were reviewed.  All starting workloads were established based on the results of the 6 minute walk test done at initial orientation visit.  The plan for exercise progression was also introduced and progression will be customized based on patient's performance and goals.    Dr. Emily Filbert is Medical Director  for [place] and [facility].

## 2020-07-23 ENCOUNTER — Other Ambulatory Visit: Payer: Self-pay

## 2020-07-23 ENCOUNTER — Encounter: Payer: Self-pay | Admitting: Speech Pathology

## 2020-07-23 ENCOUNTER — Ambulatory Visit: Payer: Medicare HMO | Admitting: Physical Therapy

## 2020-07-23 ENCOUNTER — Ambulatory Visit: Payer: Medicare HMO | Admitting: Speech Pathology

## 2020-07-23 DIAGNOSIS — R471 Dysarthria and anarthria: Secondary | ICD-10-CM | POA: Diagnosis not present

## 2020-07-23 NOTE — Therapy (Signed)
[place] MAIN [facility] SERVICES [address] [place], [place], [address] Phone: [phone]   Fax:  [phone]  Speech Language Pathology Treatment/Discharge Summary  Patient Details  Name: Terry [name] MRN: [id number] Date of Birth: [birth year] Referring Provider (SLP): Dr. Wynetta Emery   Encounter Date: 07/23/2020   End of Session - 07/23/20 1715    Visit Number 12    Number of Visits 14    Date for SLP Re-Evaluation 07/19/20    Authorization Type Medicare    Authorization Time Period Start 05/22/2020    Authorization - Visit Number 2    Authorization - Number of Visits 10    Progress Note Due on Visit 10    SLP Start Time 0250    SLP Stop Time  0335    SLP Time Calculation (min) 45 min    Activity Tolerance Patient tolerated treatment well           Past Medical History:  Diagnosis Date  . Diverticulosis   . GERD (gastroesophageal reflux disease)   . Heart murmur   . Hypercholesteremia   . Hypertension   . Mitral valve prolapse     Past Surgical History:  Procedure Laterality Date  . COLONOSCOPY WITH PROPOFOL N/A 03/06/2020   Procedure: COLONOSCOPY WITH PROPOFOL;  Surgeon: Toledo, Benay Pike, MD;  Location: [facility] ENDOSCOPY;  Service: Gastroenterology;  Laterality: N/A;  . MITRAL VALVE REPLACEMENT    . RETINAL DETACHMENT SURGERY    . RIGHT/LEFT HEART CATH AND CORONARY ANGIOGRAPHY Bilateral 02/18/2017   Procedure: Right/Left Heart Cath and Coronary Angiography;  Surgeon: Isaias Cowman, MD;  Location: [place] [facility];  Service: Cardiovascular;  Laterality: Bilateral;  . RIGHT/LEFT HEART CATH AND CORONARY ANGIOGRAPHY N/A 02/22/2020   Procedure: RIGHT/LEFT HEART CATH AND CORONARY ANGIOGRAPHY;  Surgeon: Isaias Cowman, MD;  Location: [place] [facility];  Service: Cardiovascular;  Laterality: N/A;  . TEMPOROMANDIBULAR JOINT SURGERY      There were no vitals filed for this visit.   Subjective Assessment - 07/23/20 1715     Subjective "These things I've learned here have been really helpful"                   SLP Education - 07/23/20 1715    Education Details Clear speech    Person(s) Educated Patient    Methods Explanation    Comprehension Verbalized understanding              SLP Long Term Goals - 07/24/20 1103      SLP LONG TERM GOAL #1   Title Pt will improve speech intelligibility for sentences and multi-syllabic words by controlling rate of speech, over-articulation, and increased loudness to achieve 80% intelligibility with min. SLP cues.    Status Achieved      SLP LONG TERM GOAL #2   Title Patient will complete high level word finding tasks with 80% accuracy.    Status Achieved      SLP LONG TERM GOAL #3   Title Patient will generate grammatical, fluent, and cogent sentences to complete abstract/complex linguistic task with 80% accuracy.    Status Achieved            Plan - 07/23/20 1716    Clinical Impression Statement Patient has met goal of increasing intelligibility by slowing rate of speech, over-articulating words, and increasing loudness. He is also generating cogent sentences in conversation and other tasks. Patient feels he has made progress and is ready to address  these goals on his own at home.    Speech Therapy Frequency 2x / week    Duration Other (comment)    Treatment/Interventions Language facilitation;SLP instruction and feedback;Patient/family education    Potential to Achieve Goals Good    Potential Considerations Ability to learn/carryover information;Previous level of function;Co-morbidities;Severity of impairments;Cooperation/participation level;Medical prognosis;Family/community support    Consulted and Agree with Plan of Care Patient           Patient will benefit from skilled therapeutic intervention in order to improve the following deficits and impairments:   Dysarthria and anarthria    Problem List Patient Active Problem List    Diagnosis Date Noted  . Chronic anticoagulation 05/11/2020  . Acute CVA (cerebrovascular accident) ([place]) 05/08/2020  . Hypercholesteremia   . Hypertension   . Stroke ([place])   . History of mitral valve replacement   . CKD (chronic kidney disease), stage III   . Leg pain 09/27/2017  . 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 Venous insufficiency 09/27/2017  . GERD (gastroesophageal reflux disease) 09/27/2017    Lou Miner, Student Intern 07/24/2020, 11:05 AM  [facility] [address] [place], [place], [address] Phone: [phone]   Fax:  [phone]   Name: Terry [name] MRN: [id number] Date of Birth: [name] 28, 1952

## 2020-07-24 ENCOUNTER — Other Ambulatory Visit: Payer: Self-pay

## 2020-07-24 ENCOUNTER — Encounter: Payer: Medicare HMO | Admitting: *Deleted

## 2020-07-24 DIAGNOSIS — Z952 Presence of prosthetic heart valve: Secondary | ICD-10-CM | POA: Diagnosis not present

## 2020-07-24 NOTE — Progress Notes (Signed)
Daily Session Note  Patient Details  Name: Terry [name] MRN: [id number] Date of Birth: 1951/03/04 Referring Provider:     Cardiac Rehab from 05/23/2020 in [facility] Cardiac and Pulmonary Rehab  Referring Provider Isaias Cowman MD      Encounter Date: 07/24/2020  Check In:  Session Check In - 07/24/20 0959      Check-In   Supervising physician immediately available to respond to emergencies See telemetry face sheet for immediately available ER MD    Location [facility]-Cardiac & Pulmonary Rehab    Staff Present Renita Papa, RN BSN;Jessica Luan Pulling, MA, RCEP, CCRP, CCET;Melissa [place] RDN, [place], [place], ACSM CEP, Exercise Physiologist    Virtual Visit No    Medication changes reported     No    Fall or balance concerns reported    No    Warm-up and Cool-down Performed on first and last piece of equipment    Resistance Training Performed Yes    VAD Patient? No    PAD/SET Patient? No      Pain Assessment   Currently in Pain? No/denies              Social History   Tobacco Use  Smoking Status Former Smoker  . Packs/day: 1.50  . Years: 20.00  . Pack years: 30.00  . Types: Cigarettes  . Quit date: 2  . Years since quitting: 36.5  Smokeless Tobacco Never Used    Goals Met:  Independence with exercise equipment Exercise tolerated well No report of cardiac concerns or symptoms Strength training completed today  Goals Unmet:  Not Applicable  Comments: Pt able to follow exercise prescription today without complaint.  Will continue to monitor for progression.    Dr. Emily Filbert is Medical Director for [place] and [facility].

## 2020-07-25 ENCOUNTER — Ambulatory Visit: Payer: Medicare HMO | Admitting: Speech Pathology

## 2020-07-25 ENCOUNTER — Ambulatory Visit: Payer: Medicare HMO | Admitting: Physical Therapy

## 2020-07-26 ENCOUNTER — Other Ambulatory Visit: Payer: Self-pay

## 2020-07-26 ENCOUNTER — Encounter: Payer: Medicare HMO | Admitting: *Deleted

## 2020-07-26 DIAGNOSIS — Z952 Presence of prosthetic heart valve: Secondary | ICD-10-CM | POA: Diagnosis not present

## 2020-07-26 NOTE — Progress Notes (Signed)
Daily Session Note  Patient Details  Name: Baran K Pickar MRN: [id number] Date of Birth: 03/17/1951 Referring Provider:     Cardiac Rehab from 05/23/2020 in [facility] Cardiac and Pulmonary Rehab  Referring Provider Paraschos, Alexander MD      Encounter Date: 07/26/2020  Check In:  Session Check In - 07/26/20 0950      Check-In   Supervising physician immediately available to respond to emergencies See telemetry face sheet for immediately available ER MD    Location [facility]-Cardiac & Pulmonary Rehab    Staff Present Meredith Craven, RN BSN;Joseph Hood RCP,RRT,BSRT;Jessica Hawkins, MA, RCEP, CCRP, CCET    Virtual Visit No    Medication changes reported     No    Fall or balance concerns reported    No    Warm-up and Cool-down Performed on first and last piece of equipment    Resistance Training Performed Yes    VAD Patient? No    PAD/SET Patient? No      Pain Assessment   Currently in Pain? No/denies              Social History   Tobacco Use  Smoking Status Former Smoker  . Packs/day: 1.50  . Years: 20.00  . Pack years: 30.00  . Types: Cigarettes  . Quit date: 1985  . Years since quitting: 36.6  Smokeless Tobacco Never Used    Goals Met:  Independence with exercise equipment Exercise tolerated well No report of cardiac concerns or symptoms Strength training completed today  Goals Unmet:  Not Applicable  Comments: Pt able to follow exercise prescription today without complaint.  Will continue to monitor for progression.    Dr. Mark Miller is Medical Director for [facility] and [facility]. 

## 2020-07-29 ENCOUNTER — Other Ambulatory Visit: Payer: Self-pay

## 2020-07-29 ENCOUNTER — Encounter: Payer: Medicare HMO | Attending: Cardiology | Admitting: *Deleted

## 2020-07-29 DIAGNOSIS — Z952 Presence of prosthetic heart valve: Secondary | ICD-10-CM | POA: Diagnosis present

## 2020-07-29 NOTE — Progress Notes (Signed)
Daily Session Note  Patient Details  Name: Terry [name] MRN: [id number] Date of Birth: 07-29-1951 Referring Provider:     Cardiac Rehab from 05/23/2020 in [facility] Cardiac and Pulmonary Rehab  Referring Provider Isaias Cowman MD      Encounter Date: 07/29/2020  Check In:  Session Check In - 07/29/20 1022      Check-In   Supervising physician immediately available to respond to emergencies See telemetry face sheet for immediately available ER MD    Location [facility]-Cardiac & Pulmonary Rehab    Staff Present Renita Papa, RN BSN;Joseph Hood RCP,RRT,BSRT;Kelly [place], [place], ACSM CEP, Exercise Physiologist    Virtual Visit No    Medication changes reported     No    Warm-up and Cool-down Performed on first and last piece of equipment    Resistance Training Performed Yes    VAD Patient? No    PAD/SET Patient? No      Pain Assessment   Currently in Pain? No/denies              Social History   Tobacco Use  Smoking Status Former Smoker  . Packs/day: 1.50  . Years: 20.00  . Pack years: 30.00  . Types: Cigarettes  . Quit date: 41  . Years since quitting: 36.6  Smokeless Tobacco Never Used    Goals Met:  Independence with exercise equipment Exercise tolerated well No report of cardiac concerns or symptoms Strength training completed today  Goals Unmet:  Not Applicable  Comments: Pt able to follow exercise prescription today without complaint.  Will continue to monitor for progression.'  [place] Name 05/23/20 1044         6 Minute Walk   Phase Initial     Distance 1215 feet     Walk Time 6 minutes     # of Rest Breaks 0     MPH 2.3     METS 2.93     RPE 7     VO2 Peak 10.25     Symptoms No     Resting HR 80 bpm     Resting BP 126/64     Resting Oxygen Saturation  97 %     Exercise Oxygen Saturation  during 6 min walk 99 %     Max Ex. HR 98 bpm     Max Ex. BP 134/70     2 Minute Post BP 110/56             Dr. Emily Filbert is  Medical Director for [place] and [facility].

## 2020-07-30 ENCOUNTER — Encounter: Payer: Medicare HMO | Admitting: Speech Pathology

## 2020-07-30 ENCOUNTER — Ambulatory Visit: Payer: Medicare HMO

## 2020-07-31 ENCOUNTER — Other Ambulatory Visit: Payer: Self-pay

## 2020-07-31 ENCOUNTER — Encounter: Payer: Medicare HMO | Admitting: *Deleted

## 2020-07-31 DIAGNOSIS — Z952 Presence of prosthetic heart valve: Secondary | ICD-10-CM

## 2020-07-31 NOTE — Progress Notes (Signed)
Daily Session Note  Patient Details  Name: Terry [name] MRN: [id number] Date of Birth: 11-08-1951 Referring Provider:     Cardiac Rehab from 05/23/2020 in [facility] Cardiac and Pulmonary Rehab  Referring Provider Isaias Cowman MD      Encounter Date: 07/31/2020  Check In:  Session Check In - 07/31/20 1205      Check-In   Supervising physician immediately available to respond to emergencies See telemetry face sheet for immediately available ER MD    Location [facility]-Cardiac & Pulmonary Rehab    Staff Present Heath Lark, RN, BSN, CCRP;Meredith Sherryll Burger, RN BSN;Joseph Foy [place], [place], ACSM CEP, Exercise Physiologist    Virtual Visit No    Medication changes reported     No    Fall or balance concerns reported    No    Warm-up and Cool-down Performed on first and last piece of equipment    Resistance Training Performed Yes    VAD Patient? No    PAD/SET Patient? No      Pain Assessment   Currently in Pain? No/denies              Social History   Tobacco Use  Smoking Status Former Smoker  . Packs/day: 1.50  . Years: 20.00  . Pack years: 30.00  . Types: Cigarettes  . Quit date: 41  . Years since quitting: 36.6  Smokeless Tobacco Never Used    Goals Met:  Independence with exercise equipment Exercise tolerated well No report of cardiac concerns or symptoms  Goals Unmet:  Not Applicable  Comments: Pt able to follow exercise prescription today without complaint.  Will continue to monitor for progression.    Dr. Emily Filbert is Medical Director for [facility] and [facility].

## 2020-08-01 ENCOUNTER — Encounter: Payer: Medicare HMO | Admitting: Speech Pathology

## 2020-08-01 NOTE — Patient Instructions (Signed)
Discharge Patient Instructions  Patient Details  Name: Terry [name] MRN: [id number] Date of Birth: 24-Jan-1951 Referring Provider:  Isaias Cowman, MD   Number of Visits: 24  Reason for Discharge:  Patient reached a stable level of exercise. Patient independent in their exercise. Patient has met program and personal goals.  Smoking History:  Social History   Tobacco Use  Smoking Status Former Smoker  . Packs/day: 1.50  . Years: 20.00  . Pack years: 30.00  . Types: Cigarettes  . Quit date: 75  . Years since quitting: 36.6  Smokeless Tobacco Never Used    Diagnosis:  S/P mitral valve replacement  Initial Exercise Prescription:  Initial Exercise Prescription - 05/23/20 1000      Date of Initial Exercise RX and Referring Provider   Date 05/23/20    Referring Provider Isaias Cowman MD      Treadmill   MPH 2.3    Grade 1    Minutes 15    METs 3.08      Recumbant Bike   Level 2    RPM 50    Watts 23    Minutes 15    METs 3      NuStep   Level 3    SPM 80    Minutes 15    METs 3      Prescription Details   Frequency (times per week) 3    Duration Progress to 30 minutes of continuous aerobic without signs/symptoms of physical distress      Intensity   THRR 40-80% of Max Heartrate 108/136    Ratings of Perceived Exertion 11-13    Perceived Dyspnea 0-4      Progression   Progression Continue to progress workloads to maintain intensity without signs/symptoms of physical distress.      Resistance Training   Training Prescription Yes    Weight 3 lb    Reps 10-15           Discharge Exercise Prescription (Final Exercise Prescription Changes):  Exercise Prescription Changes - 07/23/20 1400      Response to Exercise   Blood Pressure (Admit) 122/70    Blood Pressure (Exercise) 132/68    Blood Pressure (Exit) 122/60    Heart Rate (Admit) 94 bpm    Heart Rate (Exercise) 117 bpm    Heart Rate (Exit) 86 bpm    Rating of Perceived  Exertion (Exercise) 13    Symptoms none    Duration Continue with 30 min of aerobic exercise without signs/symptoms of physical distress.    Intensity THRR unchanged      Progression   Progression Continue to progress workloads to maintain intensity without signs/symptoms of physical distress.    Average METs 4.16      Resistance Training   Training Prescription Yes    Weight 5 lb    Reps 10-15      Interval Training   Interval Training No      Treadmill   MPH 2.8    Grade 5    Minutes 15    METs 5.07      Recumbant Bike   Level 6    Watts 39    Minutes 15    METs 3.83      NuStep   Level 6    Minutes 15    METs 4.2      Elliptical   Level 6    Speed 2.5    Minutes 15    METs 3.8  Functional Capacity:  6 Minute Walk    Row Name 05/23/20 1044 07/29/20 1036       6 Minute Walk   Phase Initial Discharge    Distance 1215 feet 1565 feet    Distance % Change -- 28.8 %    Distance Feet Change -- 350 ft    Walk Time 6 minutes 6 minutes    # of Rest Breaks 0 0    MPH 2.3 2.96    METS 2.93 3.54    RPE 7 10    Perceived Dyspnea  -- 0    VO2 Peak 10.25 12.4    Symptoms No No    Resting HR 80 bpm 91 bpm    Resting BP 126/64 108/72    Resting Oxygen Saturation  97 % --    Exercise Oxygen Saturation  during 6 min walk 99 % --    Max Ex. HR 98 bpm 114 bpm    Max Ex. BP 134/70 114/74    2 Minute Post BP 110/56 --           Quality of Life:  Quality of Life - 07/26/20 0955      Quality of Life Scores   Health/Function Pre 7.4 %    Health/Function Post 23.17 %    Health/Function % Change 213.11 %    Socioeconomic Pre 14.79 %    Socioeconomic Post 24.93 %    Socioeconomic % Change  68.56 %    Psych/Spiritual Pre 13.79 %    Psych/Spiritual Post 27.21 %    Psych/Spiritual % Change 97.32 %    Family Pre 14.4 %    Family Post 22.8 %    Family % Change 58.33 %    GLOBAL Pre 11.26 %    GLOBAL Post 24.31 %    GLOBAL % Change 115.9 %            Personal Goals: Goals established at orientation with interventions provided to work toward goal.  Personal Goals and Risk Factors at Admission - 05/23/20 1055      Core Components/Risk Factors/Patient Goals on Admission    Weight Management Yes;Weight Loss    Intervention Weight Management: Develop a combined nutrition and exercise program designed to reach desired caloric intake, while maintaining appropriate intake of nutrient and fiber, sodium and fats, and appropriate energy expenditure required for the weight goal.;Weight Management: Provide education and appropriate resources to help participant work on and attain dietary goals.    Admit Weight 175 lb (79.4 kg)    Goal Weight: Short Term 170 lb (77.1 kg)    Goal Weight: Long Term 165 lb (74.8 kg)    Expected Outcomes Short Term: Continue to assess and modify interventions until short term weight is achieved;Long Term: Adherence to nutrition and physical activity/exercise program aimed toward attainment of established weight goal;Understanding recommendations for meals to include 15-35% energy as protein, 25-35% energy from fat, 35-60% energy from carbohydrates, less than 255m of dietary cholesterol, 20-35 gm of total fiber daily;Understanding of distribution of calorie intake throughout the day with the consumption of 4-5 meals/snacks;Weight Loss: Understanding of general recommendations for a balanced deficit meal plan, which promotes 1-2 lb weight loss per week and includes a negative energy balance of [phone] kcal/d    Hypertension Yes    Intervention Provide education on lifestyle modifcations including regular physical activity/exercise, weight management, moderate sodium restriction and increased consumption of fresh fruit, vegetables, and low fat dairy, alcohol moderation, and smoking cessation.;Monitor  prescription use compliance.    Expected Outcomes Short Term: Continued assessment and intervention until BP is < 140/20m HG in  hypertensive participants. < 130/820mHG in hypertensive participants with diabetes, heart failure or chronic kidney disease.;Long Term: Maintenance of blood pressure at goal levels.    Lipids Yes    Intervention Provide education and support for participant on nutrition & aerobic/resistive exercise along with prescribed medications to achieve LDL <7054mHDL >62m104m  Expected Outcomes Short Term: Participant states understanding of desired cholesterol values and is compliant with medications prescribed. Participant is following exercise prescription and nutrition guidelines.;Long Term: Cholesterol controlled with medications as prescribed, with individualized exercise RX and with personalized nutrition plan. Value goals: LDL < 70mg32mL > 40 mg.            Personal Goals Discharge:  Goals and Risk Factor Review - 07/22/20 1001      Core Components/Risk Factors/Patient Goals Review   Personal Goals Review Weight Management/Obesity;Hypertension;Lipids    Review LarryKoletononitoring BP at home - 100s/70s in the morning.  He does walk when not at HT.  [facility] tries to get 7000-9000 steps per day.    Expected Outcomes Short: continue to monitor BP and steps Long:  manage risk factors long term           Exercise Goals and Review:  Exercise Goals    Row Name 05/23/20 1052             Exercise Goals   Increase Physical Activity Yes       Intervention Provide advice, education, support and counseling about physical activity/exercise needs.;Develop an individualized exercise prescription for aerobic and resistive training based on initial evaluation findings, risk stratification, comorbidities and participant's personal goals.       Expected Outcomes Short Term: Attend rehab on a regular basis to increase amount of physical activity.;Long Term: Add in home exercise to make exercise part of routine and to increase amount of physical activity.;Long Term: Exercising regularly at least 3-5 days a week.        Increase Strength and Stamina Yes       Intervention Provide advice, education, support and counseling about physical activity/exercise needs.;Develop an individualized exercise prescription for aerobic and resistive training based on initial evaluation findings, risk stratification, comorbidities and participant's personal goals.       Expected Outcomes Short Term: Increase workloads from initial exercise prescription for resistance, speed, and METs.;Short Term: Perform resistance training exercises routinely during rehab and add in resistance training at home;Long Term: Improve cardiorespiratory fitness, muscular endurance and strength as measured by increased METs and functional capacity (6MWT)       Able to understand and use rate of perceived exertion (RPE) scale Yes       Intervention Provide education and explanation on how to use RPE scale       Expected Outcomes Short Term: Able to use RPE daily in rehab to express subjective intensity level;Long Term:  Able to use RPE to guide intensity level when exercising independently       Able to understand and use Dyspnea scale Yes       Intervention Provide education and explanation on how to use Dyspnea scale       Expected Outcomes Short Term: Able to use Dyspnea scale daily in rehab to express subjective sense of shortness of breath during exertion;Long Term: Able to use Dyspnea scale to guide intensity level when exercising independently  Knowledge and understanding of Target Heart Rate Range (THRR) Yes       Intervention Provide education and explanation of THRR including how the numbers were predicted and where they are located for reference       Expected Outcomes Short Term: Able to state/look up THRR;Short Term: Able to use daily as guideline for intensity in rehab;Long Term: Able to use THRR to govern intensity when exercising independently       Able to check pulse independently Yes       Intervention Provide education and  demonstration on how to check pulse in carotid and radial arteries.;Review the importance of being able to check your own pulse for safety during independent exercise       Expected Outcomes Short Term: Able to explain why pulse checking is important during independent exercise;Long Term: Able to check pulse independently and accurately       Understanding of Exercise Prescription Yes       Intervention Provide education, explanation, and written materials on patient's individual exercise prescription       Expected Outcomes Short Term: Able to explain program exercise prescription;Long Term: Able to explain home exercise prescription to exercise independently              Exercise Goals Re-Evaluation:  Exercise Goals Re-Evaluation    Row Name 06/12/20 1247 06/17/20 1043 06/25/20 1538 07/10/20 1228 07/22/20 1005     Exercise Goal Re-Evaluation   Exercise Goals Review Increase Physical Activity;Increase Strength and Stamina;Able to understand and use rate of perceived exertion (RPE) scale;Able to understand and use Dyspnea scale;Knowledge and understanding of Target Heart Rate Range (THRR) Increase Physical Activity;Increase Strength and Stamina;Able to understand and use rate of perceived exertion (RPE) scale;Able to understand and use Dyspnea scale;Knowledge and understanding of Target Heart Rate Range (THRR);Understanding of Exercise Prescription;Able to check pulse independently Increase Physical Activity;Increase Strength and Stamina;Understanding of Exercise Prescription Increase Physical Activity;Increase Strength and Stamina;Understanding of Exercise Prescription Increase Physical Activity;Increase Strength and Stamina;Understanding of Exercise Prescription   Comments Normal is doing well so far.  He tried th eelliptical today with no issues and has moved to 5 lb weights.  Staff will monitor progress. Reviewed home exercise with pt today.  Pt plans to walk at home and go to [employer] for  exercise.  Reviewed THR, pulse, RPE, sign and symptoms, pulse oximetery and when to call 911 or MD.  Also discussed weather considerations and indoor options.  Pt voiced understanding. Sixto is doing well in rehab.  He is up to 25 watts on the bike and level 5 on the NuStep.  He has even taken on the elliptical.  We will continue to monitor his progress. Larrys average MET level is up to 3.8.  He consistently worksat RPE 12-14. 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  He was able to hike 2 miles and plans to do more hikes with this group.   Expected Outcomes Short:  attend class consistently Long: improve MET level Short: Continue to add in more exercise Long: Continue to improve stamina. Short: Increase resistance on bike Long: Continue to improve strength Short:  attend consistently Long: continue to improve MET level Short:  continue to exercise consistently Long:  maintain exercise on his own   [place] Name 07/23/20 1447             Exercise Goal Re-Evaluation   Exercise Goals Review Increase Physical Activity;Increase Strength and Stamina;Understanding of Exercise Prescription  Comments Jarquavious has been doing well in rehab.  He is now up to 3.8 METs on the ellipitical and doing great.   We will continue to monitor his progress.       Expected Outcomes Short: Continue to hike and improve stamina  Long; Continue to get stronger              Nutrition & Weight - Outcomes:  Pre Biometrics - 05/23/20 1052      Pre Biometrics   Height 5' 9.6" (1.768 m)    Weight 175 lb (79.4 kg)    BMI (Calculated) 25.39    Single Leg Stand 3.28 seconds            Nutrition:  Nutrition Therapy & Goals - 07/31/20 1005      Nutrition Therapy   Diet Heart healthy, low Na    Drug/Food Interactions Statins/Certain Fruits    Protein (specify units) 65-70g    Fiber 30 grams    Whole Grain Foods 3 servings    Saturated Fats 12 max. grams    Fruits and Vegetables 5 servings/day    Sodium 1.5 grams       Personal Nutrition Goals   Nutrition Goal ST:use  whole wheat bread LT: Getting back to ADLs - close, but not quite there.    Comments 2529m sodium 20% B, 30%L, 50%D. B: cheerios with fruit 2% milk. L: sandwich (lettuce or white bread), if out will get a chick-fil-a burger with no fires D: grill salmon, chicken, or beef (red meat 3x/week- some weeks more some less) with a light salad or grilled vegetables (ms dash and olive oil) . Would like to have pasta and bread. He reads labels all the time and monitors his sodium closely. Discussed heart healthy eating. Pt reports that he will continue to make changes and monitor his sodium after leaving the program.      Intervention Plan   Intervention Prescribe, educate and counsel regarding individualized specific dietary modifications aiming towards targeted core components such as weight, hypertension, lipid management, diabetes, heart failure and other comorbidities.;Nutrition handout(s) given to patient.    Expected Outcomes Short Term Goal: Understand basic principles of dietary content, such as calories, fat, sodium, cholesterol and nutrients.;Short Term Goal: A plan has been developed with personal nutrition goals set during dietitian appointment.;Long Term Goal: Adherence to prescribed nutrition plan.           Nutrition Discharge:  Nutrition Assessments - 05/23/20 1054      MEDFICTS Scores   Pre Score 93           Education Questionnaire Score:  Knowledge Questionnaire Score - 07/26/20 0955      Knowledge Questionnaire Score   Post Score 26/26           Goals reviewed with patient; copy given to patient.

## 2020-08-02 ENCOUNTER — Other Ambulatory Visit: Payer: Self-pay

## 2020-08-02 ENCOUNTER — Encounter: Payer: Medicare HMO | Admitting: *Deleted

## 2020-08-02 DIAGNOSIS — Z952 Presence of prosthetic heart valve: Secondary | ICD-10-CM

## 2020-08-02 NOTE — Progress Notes (Signed)
Discharge Progress Report  Patient Details  Name: Terry [name] MRN: [id number] Date of Birth: 03-19-1951 Referring Provider:     Cardiac Rehab from 05/23/2020 in [facility]  Referring Provider Isaias Cowman MD       Number of Visits: 27  Reason for Discharge:  Patient reached a stable level of exercise. Patient independent in their exercise. Patient has met program and personal goals.  Smoking History:  Social History   Tobacco Use  Smoking Status Former Smoker  . Packs/day: 1.50  . Years: 20.00  . Pack years: 30.00  . Types: Cigarettes  . Quit date: 31  . Years since quitting: 36.6  Smokeless Tobacco Never Used    Diagnosis:  S/P mitral valve replacement  ADL UCSD:   Initial Exercise Prescription:  Initial Exercise Prescription - 05/23/20 1000      Date of Initial Exercise RX and Referring Provider   Date 05/23/20    Referring Provider Isaias Cowman MD      Treadmill   MPH 2.3    Grade 1    Minutes 15    METs 3.08      Recumbant Bike   Level 2    RPM 50    Watts 23    Minutes 15    METs 3      NuStep   Level 3    SPM 80    Minutes 15    METs 3      Prescription Details   Frequency (times per week) 3    Duration Progress to 30 minutes of continuous aerobic without signs/symptoms of physical distress      Intensity   THRR 40-80% of Max Heartrate 108/136    Ratings of Perceived Exertion 11-13    Perceived Dyspnea 0-4      Progression   Progression Continue to progress workloads to maintain intensity without signs/symptoms of physical distress.      Resistance Training   Training Prescription Yes    Weight 3 lb    Reps 10-15           Discharge Exercise Prescription (Final Exercise Prescription Changes):  Exercise Prescription Changes - 07/23/20 1400      Response to Exercise   Blood Pressure (Admit) 122/70    Blood Pressure (Exercise) 132/68    Blood Pressure (Exit) 122/60    Heart Rate  (Admit) 94 bpm    Heart Rate (Exercise) 117 bpm    Heart Rate (Exit) 86 bpm    Rating of Perceived Exertion (Exercise) 13    Symptoms none    Duration Continue with 30 min of aerobic exercise without signs/symptoms of physical distress.    Intensity THRR unchanged      Progression   Progression Continue to progress workloads to maintain intensity without signs/symptoms of physical distress.    Average METs 4.16      Resistance Training   Training Prescription Yes    Weight 5 lb    Reps 10-15      Interval Training   Interval Training No      Treadmill   MPH 2.8    Grade 5    Minutes 15    METs 5.07      Recumbant Bike   Level 6    Watts 39    Minutes 15    METs 3.83      NuStep   Level 6    Minutes 15    METs 4.2  Elliptical   Level 6    Speed 2.5    Minutes 15    METs 3.8           Functional Capacity:  6 Minute Walk    Row Name 05/23/20 1044 07/29/20 1036       6 Minute Walk   Phase Initial Discharge    Distance 1215 feet 1565 feet    Distance % Change -- 28.8 %    Distance Feet Change -- 350 ft    Walk Time 6 minutes 6 minutes    # of Rest Breaks 0 0    MPH 2.3 2.96    METS 2.93 3.54    RPE 7 10    Perceived Dyspnea  -- 0    VO2 Peak 10.25 12.4    Symptoms No No    Resting HR 80 bpm 91 bpm    Resting BP 126/64 108/72    Resting Oxygen Saturation  97 % --    Exercise Oxygen Saturation  during 6 min walk 99 % --    Max Ex. HR 98 bpm 114 bpm    Max Ex. BP 134/70 114/74    2 Minute Post BP 110/56 --           Psychological, QOL, Others - Outcomes: PHQ 2/9: Depression screen [facility] 2/9 07/26/2020 06/24/2020 05/23/2020  Decreased Interest 0 0 2  Down, Depressed, Hopeless 1 1 1   PHQ - 2 Score 1 1 3   Altered sleeping 0 0 1  Tired, decreased energy 0 - 1  Change in appetite 1 1 2   Feeling bad or failure about yourself  0 0 1  Trouble concentrating 0 0 1  Moving slowly or fidgety/restless 1 0 2  Suicidal thoughts 0 0 0  PHQ-9 Score 3 2  11   Difficult doing work/chores - Not difficult at all Somewhat difficult    Quality of Life:  Quality of Life - 07/26/20 0955      Quality of Life Scores   Health/Function Pre 7.4 %    Health/Function Post 23.17 %    Health/Function % Change 213.11 %    Socioeconomic Pre 14.79 %    Socioeconomic Post 24.93 %    Socioeconomic % Change  68.56 %    Psych/Spiritual Pre 13.79 %    Psych/Spiritual Post 27.21 %    Psych/Spiritual % Change 97.32 %    Family Pre 14.4 %    Family Post 22.8 %    Family % Change 58.33 %    GLOBAL Pre 11.26 %    GLOBAL Post 24.31 %    GLOBAL % Change 115.9 %           Nutrition & Weight - Outcomes:  Pre Biometrics - 05/23/20 1052      Pre Biometrics   Height 5' 9.6" (1.768 m)    Weight 175 lb (79.4 kg)    BMI (Calculated) 25.39    Single Leg Stand 3.28 seconds            Nutrition:  Nutrition Therapy & Goals - 07/31/20 1005      Nutrition Therapy   Diet Heart healthy, low Na    Drug/Food Interactions Statins/Certain Fruits    Protein (specify units) 65-70g    Fiber 30 grams    Whole Grain Foods 3 servings    Saturated Fats 12 max. grams    Fruits and Vegetables 5 servings/day    Sodium 1.5 grams  Personal Nutrition Goals   Nutrition Goal ST:use  whole wheat bread LT: Getting back to ADLs - close, but not quite there.    Comments 2573m sodium 20% B, 30%L, 50%D. B: cheerios with fruit 2% milk. L: sandwich (lettuce or white bread), if out will get a chick-fil-a burger with no fires D: grill salmon, chicken, or beef (red meat 3x/week- some weeks more some less) with a light salad or grilled vegetables (ms dash and olive oil) . Would like to have pasta and bread. He reads labels all the time and monitors his sodium closely. Discussed heart healthy eating. Pt reports that he will continue to make changes and monitor his sodium after leaving the program.      Intervention Plan   Intervention Prescribe, educate and counsel regarding  individualized specific dietary modifications aiming towards targeted core components such as weight, hypertension, lipid management, diabetes, heart failure and other comorbidities.;Nutrition handout(s) given to patient.    Expected Outcomes Short Term Goal: Understand basic principles of dietary content, such as calories, fat, sodium, cholesterol and nutrients.;Short Term Goal: A plan has been developed with personal nutrition goals set during dietitian appointment.;Long Term Goal: Adherence to prescribed nutrition plan.           Nutrition Discharge:  Nutrition Assessments - 05/23/20 1054      MEDFICTS Scores   Pre Score 93           Education Questionnaire Score:  Knowledge Questionnaire Score - 07/26/20 0955      Knowledge Questionnaire Score   Post Score 26/26           Goals reviewed with patient; copy given to patient.

## 2020-08-02 NOTE — Progress Notes (Signed)
Daily Session Note  Patient Details  Name: Terry [name] MRN: [id number] Date of Birth: September 01, 1951 Referring Provider:     Cardiac Rehab from 05/23/2020 in [facility]  Referring Provider Terry Cowman MD      Encounter Date: 08/02/2020  Check In:  Session Check In - 08/02/20 0944      Check-In   Supervising physician immediately available to respond to emergencies See telemetry face sheet for immediately available ER MD    Location [facility]-Cardiac & Pulmonary Rehab    Staff Present Renita Papa, RN BSN;Joseph Lou Miner, [place] Exercise Physiologist    Virtual Visit No    Medication changes reported     No    Fall or balance concerns reported    No    Warm-up and Cool-down Performed on first and last piece of equipment    Resistance Training Performed Yes    VAD Patient? No      Pain Assessment   Currently in Pain? No/denies              Social History   Tobacco Use  Smoking Status Former Smoker  . Packs/day: 1.50  . Years: 20.00  . Pack years: 30.00  . Types: Cigarettes  . Quit date: 50  . Years since quitting: 36.6  Smokeless Tobacco Never Used    Goals Met:  Independence with exercise equipment Exercise tolerated well No report of cardiac concerns or symptoms Strength training completed today  Goals Unmet:  Not Applicable  Comments:  Terry [name] graduated today from  rehab with 27 sessions completed.  Details of the patient's exercise prescription and what He needs to do in order to continue the prescription and progress were discussed with patient.  Patient was given a copy of prescription and goals.  Patient verbalized understanding.  Terry [name] plans to continue to exercise by attending [employer].    Dr. Emily Filbert is Medical Director for [place] and [facility].

## 2020-08-02 NOTE — Progress Notes (Signed)
Cardiac Individual Treatment Plan  Patient Details  Name: Terry [name] MRN: [id number] Date of Birth: 1951/05/09 Referring Provider:     Cardiac Rehab from 05/23/2020 in [facility] Cardiac and Pulmonary Rehab  Referring Provider Isaias Cowman MD      Initial Encounter Date:    Cardiac Rehab from 05/23/2020 in [facility] Cardiac and Pulmonary Rehab  Date 05/23/20      Visit Diagnosis: S/P mitral valve replacement  Patient's Home Medications on Admission:  Current Outpatient Medications:  .  amiodarone (PACERONE) 200 MG tablet, Take 1 tablet (200 mg total) by mouth daily., Disp: , Rfl:  .  apixaban (ELIQUIS) 5 MG TABS tablet, Take 1 tablet (5 mg total) by mouth 2 (two) times daily., Disp: 60 tablet, Rfl: 0 .  aspirin EC 81 MG EC tablet, Take 1 tablet (81 mg total) by mouth daily., Disp: 30 tablet, Rfl: 0 .  atorvastatin (LIPITOR) 40 MG tablet, Take 1 tablet (40 mg total) by mouth daily., Disp: 30 tablet, Rfl: 0 .  clobetasol ointment (TEMOVATE) 5.73 %, Apply 1 application topically daily as needed (skin irritation). , Disp: , Rfl:  .  Multiple Vitamin (MULTIVITAMIN WITH MINERALS) TABS tablet, Take 1 tablet by mouth at bedtime., Disp: , Rfl:  .  omeprazole (PRILOSEC) 20 MG capsule, Take 20 mg by mouth every evening. , Disp: , Rfl:  .  ondansetron (ZOFRAN) 4 MG tablet, Take 4 mg by mouth every 8 (eight) hours as needed., Disp: , Rfl:  .  triamcinolone cream (KENALOG) 0.1 %, Apply 1 application topically daily as needed (skin irritation.). , Disp: , Rfl:  .  valACYclovir (VALTREX) 500 MG tablet, Take 500 mg by mouth every evening. , Disp: , Rfl:   Past Medical History: Past Medical History:  Diagnosis Date  . Diverticulosis   . GERD (gastroesophageal reflux disease)   . Heart murmur   . Hypercholesteremia   . Hypertension   . Mitral valve prolapse     Tobacco Use: Social History   Tobacco Use  Smoking Status Former Smoker  . Packs/day: 1.50  . Years: 20.00  . Pack years: 30.00   . Types: Cigarettes  . Quit date: 32  . Years since quitting: 36.6  Smokeless Tobacco Never Used    Labs: Recent Review [facility] for [facility] Cardiac and Pulmonary Rehab Latest Ref Rng & Units 05/08/2020   Cholestrol 0 - 200 mg/dL 230(H)   LDLCALC 0 - 99 mg/dL 172(H)   HDL >40 mg/dL 30(L)   Trlycerides <150 mg/dL 142   Hemoglobin A1c 4.8 - 5.6 % 5.4       Exercise Target Goals: Exercise Program Goal: Individual exercise prescription set using results from initial 6 min walk test and THRR while considering  patient's activity barriers and safety.   Exercise Prescription Goal: Initial exercise prescription builds to 30-45 minutes a day of aerobic activity, 2-3 days per week.  Home exercise guidelines will be given to patient during program as part of exercise prescription that the participant will acknowledge.   Education: Aerobic Exercise & Resistance Training: - Gives group verbal and written instruction on the various components of exercise. Focuses on aerobic and resistive training programs and the benefits of this training and how to safely progress through these programs..   Education: Exercise & Equipment Safety: - Individual verbal instruction and demonstration of equipment use and safety with use of the equipment.   Cardiac Rehab from 07/31/2020 in [facility] Cardiac and Pulmonary Rehab  Date 05/23/20  Educator [facility]  Instruction Review Code 1- Verbalizes Understanding      Education: Exercise Physiology & General Exercise Guidelines: - Group verbal and written instruction with models to review the exercise physiology of the cardiovascular system and associated critical values. Provides general exercise guidelines with specific guidelines to those with heart or lung disease.    Education: Flexibility, Balance, Mind/Body Relaxation: Provides group verbal/written instruction on the benefits of flexibility and balance training, including mind/body exercise modes such  as yoga, pilates and tai chi.  Demonstration and skill practice provided.   Activity Barriers & Risk Stratification:  Activity Barriers & Cardiac Risk Stratification - 05/23/20 1046      Activity Barriers & Cardiac Risk Stratification   Activity Barriers Deconditioning;Muscular Weakness;Other (comment);Joint Problems;Balance Concerns;Incisional Pain    Comments recent CVA, minimal residuals, R rotator cuff tear    Cardiac Risk Stratification High           6 Minute Walk:  6 Minute Walk    Row Name 05/23/20 1044 07/29/20 1036       6 Minute Walk   Phase Initial Discharge    Distance 1215 feet 1565 feet    Distance % Change -- 28.8 %    Distance Feet Change -- 350 ft    Walk Time 6 minutes 6 minutes    # of Rest Breaks 0 0    MPH 2.3 2.96    METS 2.93 3.54    RPE 7 10    Perceived Dyspnea  -- 0    VO2 Peak 10.25 12.4    Symptoms No No    Resting HR 80 bpm 91 bpm    Resting BP 126/64 108/72    Resting Oxygen Saturation  97 % --    Exercise Oxygen Saturation  during 6 min walk 99 % --    Max Ex. HR 98 bpm 114 bpm    Max Ex. BP 134/70 114/74    2 Minute Post BP 110/56 --           Oxygen Initial Assessment:   Oxygen Re-Evaluation:   Oxygen Discharge (Final Oxygen Re-Evaluation):   Initial Exercise Prescription:  Initial Exercise Prescription - 05/23/20 1000      Date of Initial Exercise RX and Referring Provider   Date 05/23/20    Referring Provider Paraschos, Alexander MD      Treadmill   MPH 2.3    Grade 1    Minutes 15    METs 3.08      Recumbant Bike   Level 2    RPM 50    Watts 23    Minutes 15    METs 3      NuStep   Level 3    SPM 80    Minutes 15    METs 3      Prescription Details   Frequency (times per week) 3    Duration Progress to 30 minutes of continuous aerobic without signs/symptoms of physical distress      Intensity   THRR 40-80% of Max Heartrate 108/136    Ratings of Perceived Exertion 11-13    Perceived Dyspnea 0-4       Progression   Progression Continue to progress workloads to maintain intensity without signs/symptoms of physical distress.      Resistance Training   Training Prescription Yes    Weight 3 lb    Reps 10-15           Perform  Capillary Blood Glucose checks as needed.  Exercise Prescription Changes:  Exercise Prescription Changes    Row Name 05/23/20 1000 06/12/20 1200 06/25/20 1500 07/10/20 1200 07/23/20 1400     Response to Exercise   Blood Pressure (Admit) 126/64 118/62 102/64 124/70 122/70   Blood Pressure (Exercise) 134/70 120/58 98/58 142/62 132/68   Blood Pressure (Exit) 110/56 110/68 92/58 102/60 122/60   Heart Rate (Admit) 80 bpm 89 bpm 82 bpm 77 bpm 94 bpm   Heart Rate (Exercise) 98 bpm 92 bpm 95 bpm 113 bpm 117 bpm   Heart Rate (Exit) 88 bpm 85 bpm 81 bpm 79 bpm 86 bpm   Oxygen Saturation (Admit) 97 % -- -- -- --   Oxygen Saturation (Exercise) 99 % -- -- -- --   Rating of Perceived Exertion (Exercise) _0 Symptoms _1    Comments walk test results -- -- -- --   Duration -- Continue with 30 min of aerobic exercise without signs/symptoms of physical distress. Continue with 30 min of aerobic exercise without signs/symptoms of physical distress. Continue with 30 min of aerobic exercise without signs/symptoms of physical distress. Continue with 30 min of aerobic exercise without signs/symptoms of physical distress.   Intensity -- THRR unchanged THRR unchanged -- THRR unchanged     Progression   Progression -- Continue to progress workloads to maintain intensity without signs/symptoms of physical distress. Continue to progress workloads to maintain intensity without signs/symptoms of physical distress. Continue to progress workloads to maintain intensity without signs/symptoms of physical distress. Continue to progress workloads to maintain intensity without signs/symptoms of physical distress.   Average METs -- 2.8 2.98 3.8 4.16      Resistance Training   Training Prescription -- Yes Yes Yes Yes   Weight -- 5 lb 5 lb 5 lb 5 lb   Reps -- 10-15 10-15 10-15 10-15     Interval Training   Interval Training -- -- No No No     Treadmill   MPH -- -- 2.5 -- 2.8   Grade -- -- 1 -- 5   Minutes -- -- 15 -- 15   METs -- -- 3.26 -- 5.07     Recumbant Bike   Level -- _2 RPM -- 50 -- -- --   Watts -- -- 25 -- 39   Minutes -- _3 METs -- 2.91 2.98 3.8 3.83     NuStep   Level -- _4 SPM -- 80 -- 80 --   Minutes -- _5 METs -- 2.6 3.2 3.8 4.2     Elliptical   Level -- -- 1 -- 6   Speed -- -- 2.5 -- 2.5   Minutes -- -- 15 -- 15   METs -- -- 2.5 -- 3.8          Exercise Comments:   Exercise Goals and Review:  Exercise Goals    Row Name 05/23/20 1052             Exercise Goals   Increase Physical Activity Yes       Intervention Provide advice, education, support and counseling about physical activity/exercise needs.;Develop an individualized exercise prescription for aerobic and resistive training based on initial evaluation findings, risk stratification, comorbidities and participant's personal goals.       Expected Outcomes Short Term: Attend rehab on a regular  basis to increase amount of physical activity.;Long Term: Add in home exercise to make exercise part of routine and to increase amount of physical activity.;Long Term: Exercising regularly at least 3-5 days a week.       Increase Strength and Stamina Yes       Intervention Provide advice, education, support and counseling about physical activity/exercise needs.;Develop an individualized exercise prescription for aerobic and resistive training based on initial evaluation findings, risk stratification, comorbidities and participant's personal goals.       Expected Outcomes Short Term: Increase workloads from initial exercise prescription for resistance, speed, and METs.;Short Term: Perform resistance training exercises  routinely during rehab and add in resistance training at home;Long Term: Improve cardiorespiratory fitness, muscular endurance and strength as measured by increased METs and functional capacity (6MWT)       Able to understand and use rate of perceived exertion (RPE) scale Yes       Intervention Provide education and explanation on how to use RPE scale       Expected Outcomes Short Term: Able to use RPE daily in rehab to express subjective intensity level;Long Term:  Able to use RPE to guide intensity level when exercising independently       Able to understand and use Dyspnea scale Yes       Intervention Provide education and explanation on how to use Dyspnea scale       Expected Outcomes Short Term: Able to use Dyspnea scale daily in rehab to express subjective sense of shortness of breath during exertion;Long Term: Able to use Dyspnea scale to guide intensity level when exercising independently       Knowledge and understanding of Target Heart Rate Range (THRR) Yes       Intervention Provide education and explanation of THRR including how the numbers were predicted and where they are located for reference       Expected Outcomes Short Term: Able to state/look up THRR;Short Term: Able to use daily as guideline for intensity in rehab;Long Term: Able to use THRR to govern intensity when exercising independently       Able to check pulse independently Yes       Intervention Provide education and demonstration on how to check pulse in carotid and radial arteries.;Review the importance of being able to check your own pulse for safety during independent exercise       Expected Outcomes Short Term: Able to explain why pulse checking is important during independent exercise;Long Term: Able to check pulse independently and accurately       Understanding of Exercise Prescription Yes       Intervention Provide education, explanation, and written materials on patient's individual exercise prescription        Expected Outcomes Short Term: Able to explain program exercise prescription;Long Term: Able to explain home exercise prescription to exercise independently              Exercise Goals Re-Evaluation :  Exercise Goals Re-Evaluation    Row Name 06/12/20 1247 06/17/20 1043 06/25/20 1538 07/10/20 1228 07/22/20 1005     Exercise Goal Re-Evaluation   Exercise Goals Review Increase Physical Activity;Increase Strength and Stamina;Able to understand and use rate of perceived exertion (RPE) scale;Able to understand and use Dyspnea scale;Knowledge and understanding of Target Heart Rate Range (THRR) Increase Physical Activity;Increase Strength and Stamina;Able to understand and use rate of perceived exertion (RPE) scale;Able to understand and use Dyspnea scale;Knowledge and understanding of Target Heart Rate Range (THRR);Understanding of  Exercise Prescription;Able to check pulse independently Increase Physical Activity;Increase Strength and Stamina;Understanding of Exercise Prescription Increase Physical Activity;Increase Strength and Stamina;Understanding of Exercise Prescription Increase Physical Activity;Increase Strength and Stamina;Understanding of Exercise Prescription   Comments Garett is doing well so far.  He tried th eelliptical today with no issues and has moved to 5 lb weights.  Staff will monitor progress. Reviewed home exercise with pt today.  Pt plans to walk at home and go to [employer] for exercise.  Reviewed THR, pulse, RPE, sign and symptoms, pulse oximetery and when to call 911 or MD.  Also discussed weather considerations and indoor options.  Pt voiced understanding. Bane is doing well in rehab.  He is up to 25 watts on the bike and level 5 on the NuStep.  He has even taken on the elliptical.  We will continue to monitor his progress. Larrys average MET level is up to 3.8.  He consistently worksat RPE 12-14. 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  He was able to hike 2 miles and plans to do  more hikes with this group.   Expected Outcomes Short:  attend class consistently Long: improve MET level Short: Continue to add in more exercise Long: Continue to improve stamina. Short: Increase resistance on bike Long: Continue to improve strength Short:  attend consistently Long: continue to improve MET level Short:  continue to exercise consistently Long:  maintain exercise on his own   [place] Name 07/23/20 1447             Exercise Goal Re-Evaluation   Exercise Goals Review Increase Physical Activity;Increase Strength and Stamina;Understanding of Exercise Prescription       Comments Hervey has been doing well in rehab.  He is now up to 3.8 METs on the ellipitical and doing great.   We will continue to monitor his progress.       Expected Outcomes Short: Continue to hike and improve stamina  Long; Continue to get stronger              Discharge Exercise Prescription (Final Exercise Prescription Changes):  Exercise Prescription Changes - 07/23/20 1400      Response to Exercise   Blood Pressure (Admit) 122/70    Blood Pressure (Exercise) 132/68    Blood Pressure (Exit) 122/60    Heart Rate (Admit) 94 bpm    Heart Rate (Exercise) 117 bpm    Heart Rate (Exit) 86 bpm    Rating of Perceived Exertion (Exercise) 13    Symptoms none    Duration Continue with 30 min of aerobic exercise without signs/symptoms of physical distress.    Intensity THRR unchanged      Progression   Progression Continue to progress workloads to maintain intensity without signs/symptoms of physical distress.    Average METs 4.16      Resistance Training   Training Prescription Yes    Weight 5 lb    Reps 10-15      Interval Training   Interval Training No      Treadmill   MPH 2.8    Grade 5    Minutes 15    METs 5.07      Recumbant Bike   Level 6    Watts 39    Minutes 15    METs 3.83      NuStep   Level 6    Minutes 15    METs 4.2      Elliptical   Level 6  Speed 2.5    Minutes 15     METs 3.8           Nutrition:  Target Goals: Understanding of nutrition guidelines, daily intake of sodium <1555m, cholesterol <201m calories 30% from fat and 7% or less from saturated fats, daily to have 5 or more servings of fruits and vegetables.  Education: Controlling Sodium/Reading Food Labels -Group verbal and written material supporting the discussion of sodium use in heart healthy nutrition. Review and explanation with models, verbal and written materials for utilization of the food label.   Education: General Nutrition Guidelines/Fats and Fiber: -Group instruction provided by verbal, written material, models and posters to present the general guidelines for heart healthy nutrition. Gives an explanation and review of dietary fats and fiber.   Biometrics:  Pre Biometrics - 05/23/20 1052      Pre Biometrics   Height 5' 9.6" (1.768 m)    Weight 175 lb (79.4 kg)    BMI (Calculated) 25.39    Single Leg Stand 3.28 seconds            Nutrition Therapy Plan and Nutrition Goals:  Nutrition Therapy & Goals - 07/31/20 1005      Nutrition Therapy   Diet Heart healthy, low Na    Drug/Food Interactions Statins/Certain Fruits    Protein (specify units) 65-70g    Fiber 30 grams    Whole Grain Foods 3 servings    Saturated Fats 12 max. grams    Fruits and Vegetables 5 servings/day    Sodium 1.5 grams      Personal Nutrition Goals   Nutrition Goal ST:use  whole wheat bread LT: Getting back to ADLs - close, but not quite there.    Comments 250068modium 20% B, 30%L, 50%D. B: cheerios with fruit 2% milk. L: sandwich (lettuce or white bread), if out will get a chick-fil-a burger with no fires D: grill salmon, chicken, or beef (red meat 3x/week- some weeks more some less) with a light salad or grilled vegetables (ms dash and olive oil) . Would like to have pasta and bread. He reads labels all the time and monitors his sodium closely. Discussed heart healthy eating. Pt reports that  he will continue to make changes and monitor his sodium after leaving the program.      Intervention Plan   Intervention Prescribe, educate and counsel regarding individualized specific dietary modifications aiming towards targeted core components such as weight, hypertension, lipid management, diabetes, heart failure and other comorbidities.;Nutrition handout(s) given to patient.    Expected Outcomes Short Term Goal: Understand basic principles of dietary content, such as calories, fat, sodium, cholesterol and nutrients.;Short Term Goal: A plan has been developed with personal nutrition goals set during dietitian appointment.;Long Term Goal: Adherence to prescribed nutrition plan.           Nutrition Assessments:  Nutrition Assessments - 05/23/20 1054      MEDFICTS Scores   Pre Score 93           MEDIFICTS Score Key:          ?70 Need to make dietary changes          40-70 Heart Healthy Diet         ? 40 Therapeutic Level Cholesterol Diet  Nutrition Goals Re-Evaluation:   Nutrition Goals Discharge (Final Nutrition Goals Re-Evaluation):   Psychosocial: Target Goals: Acknowledge presence or absence of significant depression and/or stress, maximize coping skills, provide positive support system. Participant is able  to verbalize types and ability to use techniques and skills needed for reducing stress and depression.   Education: Depression - Provides group verbal and written instruction on the correlation between heart/lung disease and depressed mood, treatment options, and the stigmas associated with seeking treatment.   Cardiac Rehab from 07/31/2020 in [facility] Cardiac and Pulmonary Rehab  Date 07/31/20  Educator Billee Cashing  Instruction Review Code 1- Verbalizes Understanding      Education: Sleep Hygiene -Provides group verbal and written instruction about how sleep can affect your health.  Define sleep hygiene, discuss sleep cycles and impact of sleep habits. Review good sleep hygiene  tips.     Education: Stress and Anxiety: - Provides group verbal and written instruction about the health risks of elevated stress and causes of high stress.  Discuss the correlation between heart/lung disease and anxiety and treatment options. Review healthy ways to manage with stress and anxiety.    Initial Review & Psychosocial Screening:  Initial Psych Review & Screening - 05/21/20 1254      Initial Review   Current issues with None Identified;Current Stress Concerns    Source of Stress Concerns Chronic Illness    Comments He has had two strokes within a week. He can look to his wife and son for support. He has a positive outlook on his health currenty.      Family Dynamics   Good Support System? Yes      Barriers   Psychosocial barriers to participate in program There are no identifiable barriers or psychosocial needs.;The patient should benefit from training in stress management and relaxation.;Psychosocial barriers identified (see note)      Screening Interventions   Interventions To provide support and resources with identified psychosocial needs;Provide feedback about the scores to participant;Encouraged to exercise    Expected Outcomes Short Term goal: Utilizing psychosocial counselor, staff and physician to assist with identification of specific Stressors or current issues interfering with healing process. Setting desired goal for each stressor or current issue identified.;Long Term Goal: Stressors or current issues are controlled or eliminated.;Short Term goal: Identification and review with participant of any Quality of Life or Depression concerns found by scoring the questionnaire.;Long Term goal: The participant improves quality of Life and PHQ9 Scores as seen by post scores and/or verbalization of changes           Quality of Life Scores:   Quality of Life - 07/26/20 0955      Quality of Life Scores   Health/Function Pre 7.4 %    Health/Function Post 23.17 %     Health/Function % Change 213.11 %    Socioeconomic Pre 14.79 %    Socioeconomic Post 24.93 %    Socioeconomic % Change  68.56 %    Psych/Spiritual Pre 13.79 %    Psych/Spiritual Post 27.21 %    Psych/Spiritual % Change 97.32 %    Family Pre 14.4 %    Family Post 22.8 %    Family % Change 58.33 %    GLOBAL Pre 11.26 %    GLOBAL Post 24.31 %    GLOBAL % Change 115.9 %          Scores of 19 and below usually indicate a poorer quality of life in these areas.  A difference of  2-3 points is a clinically meaningful difference.  A difference of 2-3 points in the total score of the Quality of Life Index has been associated with significant improvement in overall quality of life, self-image,  physical symptoms, and general health in studies assessing change in quality of life.  PHQ-9: Recent Review Flowsheet Data    Depression screen [facility] 2/9 07/26/2020 06/24/2020 05/23/2020   Decreased Interest 0 0 2   Down, Depressed, Hopeless _0 PHQ - 2 Score _1 Altered sleeping 0 0 1   Tired, decreased energy 0 - 1   Change in appetite _2 Feeling bad or failure about yourself  0 0 1   Trouble concentrating 0 0 1   Moving slowly or fidgety/restless 1 0 2   Suicidal thoughts 0 0 0   PHQ-9 Score _3 Difficult doing work/chores - Not difficult at all Somewhat difficult     Interpretation of Total Score  Total Score Depression Severity:  1-4 = Minimal depression, 5-9 = Mild depression, 10-14 = Moderate depression, 15-19 = Moderately severe depression, 20-27 = Severe depression   Psychosocial Evaluation and Intervention:  Psychosocial Evaluation - 05/21/20 1306      Psychosocial Evaluation & Interventions   Interventions Encouraged to exercise with the program and follow exercise prescription    Comments He has had two strokes within a week. He can look to his wife and son for support. He has a positive outlook on his health currenty.    Expected Outcomes Short: Start Heart Track  Long: Continue to attend Rehab to help with patients mental state.    Continue Psychosocial Services  Follow up required by staff           Psychosocial Re-Evaluation:  Psychosocial Re-Evaluation    [place] Name 06/17/20 1024 06/24/20 1013 07/22/20 1003         Psychosocial Re-Evaluation   Current issues with None Identified None Identified --     Comments Jordi is off to a good start in rehab.  He is feeling good metally. He is mostly a positive person.  He usually sleeps well as well.  He falls asleep easily and sleeps well through the night.  He is still recovereing some from his strokes, but good overall. Reviewed patient health questionnaire (PHQ-9) with patient for follow up. Previously, patients score indicated signs/symptoms of depression.  Reviewed to see if patient is improving symptom wise while in program.  Score improved and patient states that it is because he has been able to et out more and workout at rehab. He like to interact and meet new people. Christoph states he is good mentally.  He is monitoring steps walked at home.  He could only walk with a walker when he got out of hospital.  He can now walk and do what he wants.     Expected Outcomes Short: Continue to stay positive.  Long: Continue to exercise. Short: Continue to attend [facility] regularly for regular exercise and social engagement. Long: Continue to improve symptoms and manage a positive mental state. Short: continue to exercise abd be active Long: maintain positive outlook     Interventions Encouraged to attend Cardiac Rehabilitation for the exercise Encouraged to attend Cardiac Rehabilitation for the exercise --     Continue Psychosocial Services  Follow up required by staff Follow up required by staff --            Psychosocial Discharge (Final Psychosocial Re-Evaluation):  Psychosocial Re-Evaluation - 07/22/20 1003      Psychosocial Re-Evaluation   Comments Malachy states he is good mentally.  He is monitoring  steps walked at  home.  He could only walk with a walker when he got out of hospital.  He can now walk and do what he wants.    Expected Outcomes Short: continue to exercise abd be active Long: maintain positive outlook           Vocational Rehabilitation: Provide vocational rehab assistance to qualifying candidates.   Vocational Rehab Evaluation & Intervention:   Education: Education Goals: Education classes will be provided on a variety of topics geared toward better understanding of heart health and risk factor modification. Participant will state understanding/return demonstration of topics presented as noted by education test scores.  Learning Barriers/Preferences:  Learning Barriers/Preferences - 05/21/20 1258      Learning Barriers/Preferences   Learning Barriers None    Learning Preferences None           General Cardiac Education Topics:  AED/CPR: - Group verbal and written instruction with the use of models to demonstrate the basic use of the AED with the basic ABC's of resuscitation.   Anatomy & Physiology of the Heart: - Group verbal and written instruction and models provide basic cardiac anatomy and physiology, with the coronary electrical and arterial systems. Review of Valvular disease and Heart Failure   Cardiac Procedures: - Group verbal and written instruction to review commonly prescribed medications for heart disease. Reviews the medication, class of the drug, and side effects. Includes the steps to properly store meds and maintain the prescription regimen. (beta blockers and nitrates)   Cardiac Medications I: - Group verbal and written instruction to review commonly prescribed medications for heart disease. Reviews the medication, class of the drug, and side effects. Includes the steps to properly store meds and maintain the prescription regimen.   Cardiac Rehab from 07/31/2020 in [facility] Cardiac and Pulmonary Rehab  Date 07/24/20  Educator SB   Instruction Review Code 1- Verbalizes Understanding      Cardiac Medications II: -Group verbal and written instruction to review commonly prescribed medications for heart disease. Reviews the medication, class of the drug, and side effects. (all other drug classes)    Go Sex-Intimacy & Heart Disease, Get SMART - Goal Setting: - Group verbal and written instruction through game format to discuss heart disease and the return to sexual intimacy. Provides group verbal and written material to discuss and apply goal setting through the application of the S.M.A.R.T. Method.   Other Matters of the Heart: - Provides group verbal, written materials and models to describe Stable Angina and Peripheral Artery. Includes description of the disease process and treatment options available to the cardiac patient.   Infection Prevention: - Provides verbal and written material to individual with discussion of infection control including proper hand washing and proper equipment cleaning during exercise session.   Cardiac Rehab from 07/31/2020 in [facility] Cardiac and Pulmonary Rehab  Date 05/23/20  Educator [facility]  Instruction Review Code 1- Verbalizes Understanding      Falls Prevention: - Provides verbal and written material to individual with discussion of falls prevention and safety.   Cardiac Rehab from 07/31/2020 in [facility] Cardiac and Pulmonary Rehab  Date 05/23/20  Educator [facility]  Instruction Review Code 1- Verbalizes Understanding      Other: -Provides group and verbal instruction on various topics (see comments)   Knowledge Questionnaire Score:  Knowledge Questionnaire Score - 07/26/20 0955      Knowledge Questionnaire Score   Post Score 26/26           Core Components/Risk Factors/Patient Goals at  Admission:  Personal Goals and Risk Factors at Admission - 05/23/20 1055      Core Components/Risk Factors/Patient Goals on Admission    Weight Management Yes;Weight Loss    Intervention Weight  Management: Develop a combined nutrition and exercise program designed to reach desired caloric intake, while maintaining appropriate intake of nutrient and fiber, sodium and fats, and appropriate energy expenditure required for the weight goal.;Weight Management: Provide education and appropriate resources to help participant work on and attain dietary goals.    Admit Weight 175 lb (79.4 kg)    Goal Weight: Short Term 170 lb (77.1 kg)    Goal Weight: Long Term 165 lb (74.8 kg)    Expected Outcomes Short Term: Continue to assess and modify interventions until short term weight is achieved;Long Term: Adherence to nutrition and physical activity/exercise program aimed toward attainment of established weight goal;Understanding recommendations for meals to include 15-35% energy as protein, 25-35% energy from fat, 35-60% energy from carbohydrates, less than 224m of dietary cholesterol, 20-35 gm of total fiber daily;Understanding of distribution of calorie intake throughout the day with the consumption of 4-5 meals/snacks;Weight Loss: Understanding of general recommendations for a balanced deficit meal plan, which promotes 1-2 lb weight loss per week and includes a negative energy balance of [phone] kcal/d    Hypertension Yes    Intervention Provide education on lifestyle modifcations including regular physical activity/exercise, weight management, moderate sodium restriction and increased consumption of fresh fruit, vegetables, and low fat dairy, alcohol moderation, and smoking cessation.;Monitor prescription use compliance.    Expected Outcomes Short Term: Continued assessment and intervention until BP is < 140/975mHG in hypertensive participants. < 130/8058mG in hypertensive participants with diabetes, heart failure or chronic kidney disease.;Long Term: Maintenance of blood pressure at goal levels.    Lipids Yes    Intervention Provide education and support for participant on nutrition & aerobic/resistive  exercise along with prescribed medications to achieve LDL <30m51mDL >40mg63m Expected Outcomes Short Term: Participant states understanding of desired cholesterol values and is compliant with medications prescribed. Participant is following exercise prescription and nutrition guidelines.;Long Term: Cholesterol controlled with medications as prescribed, with individualized exercise RX and with personalized nutrition plan. Value goals: LDL < 30mg,49m > 40 mg.           Education:Diabetes - Individual verbal and written instruction to review signs/symptoms of diabetes, desired ranges of glucose level fasting, after meals and with exercise. Acknowledge that pre and post exercise glucose checks will be done for 3 sessions at entry of program.   Education: Know Your Numbers and Risk Factors: -Group verbal and written instruction about important numbers in your health.  Discussion of what are risk factors and how they play a role in the disease process.  Review of Cholesterol, Blood Pressure, Diabetes, and BMI and the role they play in your overall health.   Core Components/Risk Factors/Patient Goals Review:   Goals and Risk Factor Review    Row Name 06/17/20 1025 07/22/20 1001           Core Components/Risk Factors/Patient Goals Review   Personal Goals Review Weight Management/Obesity;Hypertension;Lipids Weight Management/Obesity;Hypertension;Lipids      Review Khyson Garalding well in rehab.  His weight is still coming down some but he would like to continue to build muscle back up. His pressures have been good in class, but he doesn't check them at home.  He was encouraged to get into routine of checking them.  Overall  he is doing well. [place] is monitoring BP at home - 100s/70s in the morning.  He does walk when not at [facility].  He tries to get 7000-9000 steps per day.      Expected Outcomes Short: Start tracking pressures  Long; Continue to monitor risk factors. Short: continue to monitor BP and  steps Long:  manage risk factors long term             Core Components/Risk Factors/Patient Goals at Discharge (Final Review):   Goals and Risk Factor Review - 07/22/20 1001      Core Components/Risk Factors/Patient Goals Review   Personal Goals Review Weight Management/Obesity;Hypertension;Lipids    Review Eschol is monitoring BP at home - 100s/70s in the morning.  He does walk when not at [facility].  He tries to get 7000-9000 steps per day.    Expected Outcomes Short: continue to monitor BP and steps Long:  manage risk factors long term           ITP Comments:  ITP Comments    Row Name 05/21/20 1303 05/23/20 1044 06/12/20 0644 07/10/20 1108 08/02/20 0946   ITP Comments Virtual Visit completed. Patient informed on EP and RD appointment and 6 Minute walk test. Patient also informed of patient health questionnaires on My Chart. Patient Verbalizes understanding. Visit diagnosis can be found in [facility] 04/11/2020. Completed 6MWT and gym orientation.  Initial ITP created and sent for review to Dr. Emily Filbert, Medical Director. 30 Day review completed. Medical Director ITP review done, changes made as directed, and signed approval by Medical Director. 30 Day review completed. Medical Director ITP review done, changes made as directed, and signed approval by Medical Director. Maher graduated today from  rehab with 27 sessions completed.  Details of the patient's exercise prescription and what He needs to do in order to continue the prescription and progress were discussed with patient.  Patient was given a copy of prescription and goals.  Patient verbalized understanding.  Itzel plans to continue to exercise by attending [employer].          Comments: Discharge ITP

## 2020-08-06 ENCOUNTER — Ambulatory Visit: Payer: Medicare HMO

## 2020-08-06 ENCOUNTER — Encounter: Payer: Medicare HMO | Admitting: Speech Pathology

## 2020-08-08 ENCOUNTER — Encounter: Payer: Medicare HMO | Admitting: Speech Pathology

## 2020-08-08 ENCOUNTER — Ambulatory Visit: Payer: Medicare HMO | Admitting: Physical Therapy

## 2020-08-13 ENCOUNTER — Encounter: Payer: Medicare HMO | Admitting: Speech Pathology

## 2020-08-13 ENCOUNTER — Ambulatory Visit: Payer: Medicare HMO

## 2020-08-15 ENCOUNTER — Encounter: Payer: Medicare HMO | Admitting: Speech Pathology

## 2020-08-15 ENCOUNTER — Ambulatory Visit: Payer: Medicare HMO | Admitting: Physical Therapy

## 2020-08-20 ENCOUNTER — Ambulatory Visit: Payer: Medicare HMO

## 2020-08-20 ENCOUNTER — Encounter: Payer: Medicare HMO | Admitting: Speech Pathology

## 2020-08-22 ENCOUNTER — Encounter: Payer: Medicare HMO | Admitting: Speech Pathology

## 2020-08-22 ENCOUNTER — Ambulatory Visit: Payer: Medicare HMO

## 2020-08-27 ENCOUNTER — Ambulatory Visit: Payer: Medicare HMO | Admitting: Speech Pathology

## 2020-08-27 ENCOUNTER — Ambulatory Visit: Payer: Medicare HMO

## 2020-08-29 ENCOUNTER — Ambulatory Visit: Payer: Medicare HMO

## 2020-08-29 ENCOUNTER — Ambulatory Visit: Payer: Medicare HMO | Admitting: Speech Pathology

## 2021-03-12 ENCOUNTER — Other Ambulatory Visit: Payer: Self-pay | Admitting: Gastroenterology

## 2021-03-12 DIAGNOSIS — R1312 Dysphagia, oropharyngeal phase: Secondary | ICD-10-CM

## 2021-03-17 ENCOUNTER — Other Ambulatory Visit: Payer: Self-pay

## 2021-03-17 ENCOUNTER — Ambulatory Visit
Admission: RE | Admit: 2021-03-17 | Discharge: 2021-03-17 | Disposition: A | Discharge status: Home / Self Care | Payer: Medicare HMO | Source: Ambulatory Visit | Attending: Gastroenterology | Admitting: Gastroenterology

## 2021-03-17 DIAGNOSIS — R1312 Dysphagia, oropharyngeal phase: Secondary | ICD-10-CM | POA: Diagnosis not present

## 2021-03-17 NOTE — Therapy (Signed)
[facility] DIAGNOSTIC RADIOLOGY [address] [place], [place], [address] Phone: [phone]   Fax:     Modified Barium Swallow  Patient Details  Name: Terry [name] MRN: [id number] Date of Birth: [birth year] Referring Provider (SLP): Dr. Laural Benes   Encounter Date: 03/17/2021   End of Session - 03/17/21 1340    Visit Number 1    Number of Visits 1    Date for SLP Re-Evaluation 03/17/21    SLP Start Time 1245    SLP Stop Time  1300    SLP Time Calculation (min) 15 min    Activity Tolerance Patient tolerated treatment well               Objective Swallowing Evaluation: Type of Study: MBS-Modified Barium Swallow Study   Patient Details  Name: Terry [name] MRN: [id number] Date of Birth: 13-Jun-1951  Today's Date: 03/17/2021 Time: SLP Start Time (ACUTE ONLY): 1245 -SLP Stop Time (ACUTE ONLY): 1300  SLP Time Calculation (min) (ACUTE ONLY): 15 min   Past Medical History:  Past Medical History:  Diagnosis Date  . Diverticulosis   . GERD (gastroesophageal reflux disease)   . Heart murmur   . Hypercholesteremia   . Hypertension   . Mitral valve prolapse    Past Surgical History:  Past Surgical History:  Procedure Laterality Date  . COLONOSCOPY WITH PROPOFOL N/A 03/06/2020   Procedure: COLONOSCOPY WITH PROPOFOL;  Surgeon: Toledo, Boykin Nearing, MD;  Location: [facility] ENDOSCOPY;  Service: Gastroenterology;  Laterality: N/A;  . MITRAL VALVE REPLACEMENT    . RETINAL DETACHMENT SURGERY    . RIGHT/LEFT HEART CATH AND CORONARY ANGIOGRAPHY Bilateral 02/18/2017   Procedure: Right/Left Heart Cath and Coronary Angiography;  Surgeon: Marcina Millard, MD;  Location: [facility] INVASIVE CV LAB;  Service: Cardiovascular;  Laterality: Bilateral;  . RIGHT/LEFT HEART CATH AND CORONARY ANGIOGRAPHY N/A 02/22/2020   Procedure: RIGHT/LEFT HEART CATH AND CORONARY ANGIOGRAPHY;  Surgeon: Marcina Millard, MD;  Location: [facility] INVASIVE CV LAB;  Service:  Cardiovascular;  Laterality: N/A;  . TEMPOROMANDIBULAR JOINT SURGERY     HPI: Terry [name] is a 70 y.o. male with past medical history noted for GERD, CHF, HTN, CVA, aphasia referred by GI for MBS to assess oropharyngeal swallow function. Pt is known to SLP services from prior course of outpatient therapy for aphasia. 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 Patient is reporting globus sensation in mid to upper neck with solids, liquids.   Subjective: "It feels like there's something there right now"    Assessment / Plan / Recommendation  CHL IP CLINICAL IMPRESSIONS 03/17/2021  Clinical Impression Patient presents with oropharyngeal swallowing appearing grossly within functional limits. Oral stage is characterized by adequate bolus formation, containment, rotary mastication and anterior to posterior transfer. Pharyngeal swallow initiation occurs at the level of the valleculae for solids, with delay to pyriform sinuses with larger sips of liquids. Pharyngeal stage is noted for adequate tongue base retraction, hyolaryngeal excursion, and pharyngeal constriction. Epiglottic deflection is complete. With larger/consecutive sips of thin liquid, there is trace, transient penetration x 2 during the swallow due to delayed closure of the laryngeal vestibule. There is no aspiration. There is no abnormal pharyngeal residue. Cervical esophageal phase of swallowing appears within normal limits, with adequate amplitude/duration of cricopharyngeal opening and clearance of bolus through the cervical esophagus. Pt reported globus sensation prior to the assessment as well as during, with delayed throat clear x1 that was not associated with laryngeal penetration or residue. Pt  reported that he found out he has been taking his omeprazole incorrectly and plans to adjust timing of dosage based on GI's recommendation. Encouraged pt to follow up with GI; may benefit from esophageal assessment. Provided handout with dietary/behavioral  modifications for GERD should this aid symptoms. Recommend regular diet/thin liquids, meds whole with liquid. Advised pt to slow rate when eating, take small bites and sips, alternate solids and liquids. At this time no further skilled ST is indicated.  SLP Visit Diagnosis Dysphagia, oropharyngeal phase (R13.12)  Attention and concentration deficit following --  Frontal lobe and executive function deficit following --  Impact on safety and function Mild aspiration risk      CHL IP TREATMENT RECOMMENDATION 03/17/2021  Treatment Recommendations No treatment recommended at this time     No flowsheet data found.  CHL IP DIET RECOMMENDATION 03/17/2021  SLP Diet Recommendations Regular solids;Thin liquid  Liquid Administration via Cup  Medication Administration Whole meds with liquid  Compensations Slow rate;Small sips/bites;Multiple dry swallows after each bite/sip  Postural Changes Remain semi-upright after after feeds/meals (Comment);Seated upright at 90 degrees      CHL IP OTHER RECOMMENDATIONS 03/17/2021  Recommended Consults Consider esophageal assessment  Oral Care Recommendations Oral care BID  Other Recommendations --      CHL IP FOLLOW UP RECOMMENDATIONS 03/17/2021  Follow up Recommendations None      No flowsheet data found.         CHL IP ORAL PHASE 03/17/2021  Oral Phase WFL  Oral - Pudding Teaspoon --  Oral - Pudding Cup --  Oral - Honey Teaspoon --  Oral - Honey Cup --  Oral - Nectar Teaspoon --  Oral - Nectar Cup --  Oral - Nectar Straw --  Oral - Thin Teaspoon --  Oral - Thin Cup --  Oral - Thin Straw --  Oral - Puree --  Oral - Mech Soft --  Oral - Regular --  Oral - Multi-Consistency --  Oral - Pill --  Oral Phase - Comment --    CHL IP PHARYNGEAL PHASE 03/17/2021  Pharyngeal Phase WFL  Pharyngeal- Pudding Teaspoon --  Pharyngeal --  Pharyngeal- Pudding Cup --  Pharyngeal --  Pharyngeal- Honey Teaspoon --  Pharyngeal --  Pharyngeal- Honey Cup --   Pharyngeal --  Pharyngeal- Nectar Teaspoon --  Pharyngeal --  Pharyngeal- Nectar Cup [facility]  Pharyngeal Material does not enter airway  Pharyngeal- Nectar Straw --  Pharyngeal --  Pharyngeal- Thin Teaspoon --  Pharyngeal --  Pharyngeal- Thin Cup WFL;Delayed swallow initiation-pyriform sinuses;Penetration/Aspiration during swallow  Pharyngeal Material does not enter airway;Material enters airway, remains ABOVE vocal cords then ejected out  Pharyngeal- Thin Straw --  Pharyngeal --  Pharyngeal- Puree WFL  Pharyngeal Material does not enter airway  Pharyngeal- Mechanical Soft WFL  Pharyngeal Material does not enter airway  Pharyngeal- Regular --  Pharyngeal --  Pharyngeal- Multi-consistency --  Pharyngeal --  Pharyngeal- Pill NT  Pharyngeal --  Pharyngeal Comment --     CHL IP CERVICAL ESOPHAGEAL PHASE 03/17/2021  Cervical Esophageal Phase WFL  Pudding Teaspoon --  Pudding Cup --  Honey Teaspoon --  Honey Cup --  Nectar Teaspoon --  Nectar Cup --  Nectar Straw --  Thin Teaspoon --  Thin Cup --  Thin Straw --  Puree --  Mechanical Soft --  Regular --  Multi-consistency --  Pill --  Cervical Esophageal Comment --    Rondel Baton, MS, CCC-SLP Speech-Language Pathologist  [facility]  E Andric Kerce 03/17/2021, 1:41 PM   Oropharyngeal dysphagia - Plan: DG SWALLOW FUNC OP MEDICARE SPEECH PATH, DG SWALLOW FUNC OP MEDICARE SPEECH PATH        Problem List Patient Active Problem List   Diagnosis Date Noted  . Chronic anticoagulation 05/11/2020  . Acute CVA (cerebrovascular accident) (HCC) 05/08/2020  . Hypercholesteremia   . Hypertension   . Stroke (HCC)   . History of mitral valve replacement   . CKD (chronic kidney disease), stage III (HCC)   . Leg pain 09/27/2017  . 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 Venous insufficiency 09/27/2017  . GERD (gastroesophageal reflux disease) 09/27/2017    Arlana Lindau 03/17/2021, 1:40 PM  [facility] DIAGNOSTIC RADIOLOGY [address] [place], [place], [address] Phone: [phone]   Fax:     Name: Terry [name] MRN: [id number] Date of Birth: [birth year]

## 2021-03-20 ENCOUNTER — Other Ambulatory Visit: Payer: Self-pay | Admitting: Gastroenterology

## 2021-03-20 DIAGNOSIS — R1312 Dysphagia, oropharyngeal phase: Secondary | ICD-10-CM

## 2021-03-28 ENCOUNTER — Other Ambulatory Visit: Payer: Self-pay

## 2021-03-28 ENCOUNTER — Ambulatory Visit
Admission: RE | Admit: 2021-03-28 | Discharge: 2021-03-28 | Disposition: A | Discharge status: Home / Self Care | Payer: Medicare HMO | Source: Ambulatory Visit | Attending: Gastroenterology | Admitting: Gastroenterology

## 2021-03-28 DIAGNOSIS — R1312 Dysphagia, oropharyngeal phase: Secondary | ICD-10-CM | POA: Insufficient documentation

## 2021-04-01 ENCOUNTER — Emergency Department: Payer: Medicare HMO

## 2021-04-01 ENCOUNTER — Other Ambulatory Visit: Payer: Self-pay

## 2021-04-01 ENCOUNTER — Inpatient Hospital Stay: Payer: Medicare HMO

## 2021-04-01 ENCOUNTER — Inpatient Hospital Stay
Admission: EM | Admit: 2021-04-01 | Discharge: 2021-04-03 | DRG: 065 | Disposition: A | Discharge status: Home / Self Care | Payer: Medicare HMO | Attending: Internal Medicine | Admitting: Internal Medicine

## 2021-04-01 DIAGNOSIS — R531 Weakness: Secondary | ICD-10-CM

## 2021-04-01 DIAGNOSIS — Z880 Allergy status to penicillin: Secondary | ICD-10-CM

## 2021-04-01 DIAGNOSIS — Z87891 Personal history of nicotine dependence: Secondary | ICD-10-CM | POA: Diagnosis not present

## 2021-04-01 DIAGNOSIS — I129 Hypertensive chronic kidney disease with stage 1 through stage 4 chronic kidney disease, or unspecified chronic kidney disease: Secondary | ICD-10-CM | POA: Diagnosis present

## 2021-04-01 DIAGNOSIS — R29703 NIHSS score 3: Secondary | ICD-10-CM | POA: Diagnosis present

## 2021-04-01 DIAGNOSIS — I10 Essential (primary) hypertension: Secondary | ICD-10-CM | POA: Diagnosis not present

## 2021-04-01 DIAGNOSIS — E785 Hyperlipidemia, unspecified: Secondary | ICD-10-CM | POA: Diagnosis present

## 2021-04-01 DIAGNOSIS — I480 Paroxysmal atrial fibrillation: Secondary | ICD-10-CM | POA: Diagnosis present

## 2021-04-01 DIAGNOSIS — Z79899 Other long term (current) drug therapy: Secondary | ICD-10-CM | POA: Diagnosis not present

## 2021-04-01 DIAGNOSIS — Z7982 Long term (current) use of aspirin: Secondary | ICD-10-CM

## 2021-04-01 DIAGNOSIS — K219 Gastro-esophageal reflux disease without esophagitis: Secondary | ICD-10-CM | POA: Diagnosis present

## 2021-04-01 DIAGNOSIS — I6389 Other cerebral infarction: Secondary | ICD-10-CM

## 2021-04-01 DIAGNOSIS — Z91041 Radiographic dye allergy status: Secondary | ICD-10-CM | POA: Diagnosis not present

## 2021-04-01 DIAGNOSIS — N1831 Chronic kidney disease, stage 3a: Secondary | ICD-10-CM | POA: Diagnosis not present

## 2021-04-01 DIAGNOSIS — R4701 Aphasia: Secondary | ICD-10-CM | POA: Diagnosis present

## 2021-04-01 DIAGNOSIS — I63532 Cerebral infarction due to unspecified occlusion or stenosis of left posterior cerebral artery: Secondary | ICD-10-CM | POA: Diagnosis not present

## 2021-04-01 DIAGNOSIS — Z20822 Contact with and (suspected) exposure to covid-19: Secondary | ICD-10-CM | POA: Diagnosis present

## 2021-04-01 DIAGNOSIS — Z7901 Long term (current) use of anticoagulants: Secondary | ICD-10-CM

## 2021-04-01 DIAGNOSIS — I69351 Hemiplegia and hemiparesis following cerebral infarction affecting right dominant side: Secondary | ICD-10-CM | POA: Diagnosis not present

## 2021-04-01 DIAGNOSIS — E7800 Pure hypercholesterolemia, unspecified: Secondary | ICD-10-CM | POA: Diagnosis present

## 2021-04-01 DIAGNOSIS — N183 Chronic kidney disease, stage 3 unspecified: Secondary | ICD-10-CM | POA: Diagnosis present

## 2021-04-01 DIAGNOSIS — Z952 Presence of prosthetic heart valve: Secondary | ICD-10-CM

## 2021-04-01 DIAGNOSIS — I639 Cerebral infarction, unspecified: Secondary | ICD-10-CM | POA: Diagnosis present

## 2021-04-01 DIAGNOSIS — Z881 Allergy status to other antibiotic agents status: Secondary | ICD-10-CM

## 2021-04-01 LAB — DIFFERENTIAL
Abs Immature Granulocytes: 0.11 10*3/uL — ABNORMAL HIGH (ref 0.00–0.07)
Basophils Absolute: 0 10*3/uL (ref 0.0–0.1)
Basophils Relative: 0 %
Eosinophils Absolute: 0 10*3/uL (ref 0.0–0.5)
Eosinophils Relative: 0 %
Immature Granulocytes: 1 %
Lymphocytes Relative: 8 %
Lymphs Abs: 0.6 10*3/uL — ABNORMAL LOW (ref 0.7–4.0)
Monocytes Absolute: 0.3 10*3/uL (ref 0.1–1.0)
Monocytes Relative: 5 %
Neutro Abs: 6.6 10*3/uL (ref 1.7–7.7)
Neutrophils Relative %: 86 %

## 2021-04-01 LAB — COMPREHENSIVE METABOLIC PANEL
ALT: 42 U/L (ref 0–44)
AST: 33 U/L (ref 15–41)
Albumin: 4.5 g/dL (ref 3.5–5.0)
Alkaline Phosphatase: 57 U/L (ref 38–126)
Anion gap: 12 (ref 5–15)
BUN: 21 mg/dL (ref 8–23)
CO2: 22 mmol/L (ref 22–32)
Calcium: 9.5 mg/dL (ref 8.9–10.3)
Chloride: 103 mmol/L (ref 98–111)
Creatinine, Ser: 0.95 mg/dL (ref 0.61–1.24)
GFR, Estimated: >60 mL/min (ref >60)
Glucose, Bld: 143 mg/dL — ABNORMAL HIGH (ref 70–99)
Potassium: 4.1 mmol/L (ref 3.5–5.1)
Sodium: 137 mmol/L (ref 135–145)
Total Bilirubin: 1.2 mg/dL (ref 0.3–1.2)
Total Protein: 7.1 g/dL (ref 6.5–8.1)

## 2021-04-01 LAB — CBC
HCT: 45.9 % (ref 39.0–52.0)
Hemoglobin: 16.1 g/dL (ref 13.0–17.0)
MCH: 35.4 pg — ABNORMAL HIGH (ref 26.0–34.0)
MCHC: 35.1 g/dL (ref 30.0–36.0)
MCV: 100.9 fL — ABNORMAL HIGH (ref 80.0–100.0)
Platelets: 151 10*3/uL (ref 150–400)
RBC: 4.55 MIL/uL (ref 4.22–5.81)
RDW: 12.2 % (ref 11.5–15.5)
WBC: 7.6 10*3/uL (ref 4.0–10.5)
nRBC: 0 % (ref 0.0–0.2)

## 2021-04-01 LAB — APTT: aPTT: 28 seconds (ref 24–36)

## 2021-04-01 LAB — CBG MONITORING, ED: Glucose-Capillary: 125 mg/dL — ABNORMAL HIGH (ref 70–99)

## 2021-04-01 LAB — PROTIME-INR
INR: 1.1 (ref 0.8–1.2)
Prothrombin Time: 13.7 seconds (ref 11.4–15.2)

## 2021-04-01 MED ORDER — ACETAMINOPHEN 325 MG PO TABS: 650.0000 mg | ORAL_TABLET | ORAL | Status: DC | PRN

## 2021-04-01 MED ORDER — AMIODARONE HCL 200 MG PO TABS
200.0000 mg | ORAL_TABLET | Freq: Every day | ORAL | Status: DC
  Administered 2021-04-02 – 2021-04-03 (×2): 200 mg via ORAL
  Filled 2021-04-01 (×2): qty 1

## 2021-04-01 MED ORDER — HYDROCORTISONE NA SUCCINATE PF 250 MG IJ SOLR
200.0000 mg | Freq: Once | INTRAMUSCULAR | Status: AC
  Administered 2021-04-01: 200 mg via INTRAVENOUS
  Filled 2021-04-01: qty 200

## 2021-04-01 MED ORDER — LORAZEPAM 1 MG PO TABS
1.0000 mg | ORAL_TABLET | ORAL | Status: DC | PRN
  Administered 2021-04-01: 1 mg via ORAL
  Filled 2021-04-01: qty 1

## 2021-04-01 MED ORDER — ACETAMINOPHEN 160 MG/5ML PO SOLN
650.0000 mg | ORAL | Status: DC | PRN
  Filled 2021-04-01: qty 20.3

## 2021-04-01 MED ORDER — VALACYCLOVIR HCL 500 MG PO TABS
500.0000 mg | ORAL_TABLET | Freq: Every evening | ORAL | Status: DC
  Administered 2021-04-02: 500 mg via ORAL
  Filled 2021-04-01 (×2): qty 1

## 2021-04-01 MED ORDER — STROKE: EARLY STAGES OF RECOVERY BOOK: Freq: Once | Status: AC

## 2021-04-01 MED ORDER — ATORVASTATIN CALCIUM 20 MG PO TABS
40.0000 mg | ORAL_TABLET | Freq: Every day | ORAL | Status: DC
  Administered 2021-04-02 – 2021-04-03 (×2): 40 mg via ORAL
  Filled 2021-04-01 (×3): qty 2

## 2021-04-01 MED ORDER — SODIUM CHLORIDE 0.9 % IV SOLN: INTRAVENOUS | Status: DC

## 2021-04-01 MED ORDER — DIPHENHYDRAMINE HCL 50 MG/ML IJ SOLN
50.0000 mg | Freq: Once | INTRAMUSCULAR | Status: AC
  Administered 2021-04-01: 50 mg via INTRAVENOUS
  Filled 2021-04-01: qty 1

## 2021-04-01 MED ORDER — ACETAMINOPHEN 650 MG RE SUPP: 650.0000 mg | RECTAL | Status: DC | PRN

## 2021-04-01 MED ORDER — IOHEXOL 350 MG/ML SOLN
75.0000 mL | Freq: Once | INTRAVENOUS | Status: AC | PRN
  Administered 2021-04-01: 75 mL via INTRAVENOUS

## 2021-04-01 MED ORDER — ASPIRIN 325 MG PO TABS
325.0000 mg | ORAL_TABLET | Freq: Every day | ORAL | Status: DC
  Filled 2021-04-01 (×2): qty 1

## 2021-04-01 MED ORDER — DIPHENHYDRAMINE HCL 25 MG PO CAPS: 50.0000 mg | ORAL_CAPSULE | Freq: Once | ORAL | Status: AC

## 2021-04-01 MED ORDER — SENNOSIDES-DOCUSATE SODIUM 8.6-50 MG PO TABS: 1.0000 | ORAL_TABLET | Freq: Every evening | ORAL | Status: DC | PRN

## 2021-04-01 MED ORDER — APIXABAN 5 MG PO TABS
5.0000 mg | ORAL_TABLET | Freq: Two times a day (BID) | ORAL | Status: DC
  Administered 2021-04-01 – 2021-04-03 (×4): 5 mg via ORAL
  Filled 2021-04-01 (×4): qty 1

## 2021-04-01 MED ORDER — ASPIRIN EC 81 MG PO TBEC
81.0000 mg | DELAYED_RELEASE_TABLET | Freq: Every day | ORAL | Status: DC
  Administered 2021-04-02 – 2021-04-03 (×2): 81 mg via ORAL
  Filled 2021-04-01 (×2): qty 1

## 2021-04-01 MED ORDER — SODIUM CHLORIDE 0.9% FLUSH: 3.0000 mL | Freq: Once | INTRAVENOUS | Status: DC

## 2021-04-01 NOTE — ED Provider Notes (Signed)
[facility] Department Provider Note ____________________________________________   Event Date/Time   First MD Initiated Contact with Patient 04/01/21 1605     (approximate)  I have reviewed the triage vital signs and the nursing notes.  HISTORY  Chief Complaint Weakness   HPI Terry [name] is a 70 y.o. malewho presents to the ED for evaluation of right sided weakness.   Chart review indicates hx HTN, HLD, CVA, pAF on eliquis. Mitral regurg s/p MVR. 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  Patient presents to the ED via POV from home for evaluation of acute right-sided weakness and gait change since about 1030 this morning.  He reports that he typically ambulates independently without assistance device, lives at home with his wife, and has no significant residual deficits after physical therapy from his stroke last year.  He reports sitting in the front porch awaiting contractors to arrive at his house when he developed a sensation of rapid change of sensation on the right side of his body with associated weakness, paresthesias and numbness.  He reports a stumbling gait as well in the same timeframe.  He denies any headache, trauma, falls or syncopal episodes.  Denies recent illnesses or fevers.  He reports continued right-sided weakness despite taking a nap, so he presents to the ED for evaluation of stroke.    Last known normal was about 1030a this morning.   Past Medical History:  Diagnosis Date  . Diverticulosis   . GERD (gastroesophageal reflux disease)   . Heart murmur   . Hypercholesteremia   . Hypertension   . Mitral valve prolapse     Patient Active Problem List   Diagnosis Date Noted  . CVA (cerebral vascular accident) (HCC) 04/01/2021  . Chronic anticoagulation 05/11/2020  . Acute CVA (cerebrovascular accident) (HCC) 05/08/2020  . Hypercholesteremia   . Hypertension    . Stroke (HCC)   . History of mitral valve replacement   . CKD (chronic kidney disease), stage III (HCC)   . Leg pain 09/27/2017  . 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 Venous insufficiency 09/27/2017  . GERD (gastroesophageal reflux disease) 09/27/2017    Past Surgical History:  Procedure Laterality Date  . COLONOSCOPY WITH PROPOFOL N/A 03/06/2020   Procedure: COLONOSCOPY WITH PROPOFOL;  Surgeon: Toledo, Boykin Nearing, MD;  Location: [facility] ENDOSCOPY;  Service: Gastroenterology;  Laterality: N/A;  . MITRAL VALVE REPLACEMENT    . RETINAL DETACHMENT SURGERY    . RIGHT/LEFT HEART CATH AND CORONARY ANGIOGRAPHY Bilateral 02/18/2017   Procedure: Right/Left Heart Cath and Coronary Angiography;  Surgeon: Marcina Millard, MD;  Location: [facility] INVASIVE CV LAB;  Service: Cardiovascular;  Laterality: Bilateral;  . RIGHT/LEFT HEART CATH AND CORONARY ANGIOGRAPHY N/A 02/22/2020   Procedure: RIGHT/LEFT HEART CATH AND CORONARY ANGIOGRAPHY;  Surgeon: Marcina Millard, MD;  Location: [facility] INVASIVE CV LAB;  Service: Cardiovascular;  Laterality: N/A;  . TEMPOROMANDIBULAR JOINT SURGERY      Prior to Admission medications   Medication Sig Start Date End Date Taking? Authorizing Provider  amiodarone (PACERONE) 200 MG tablet Take 1 tablet (200 mg total) by mouth daily. 05/09/20   Delfino Lovett, MD  apixaban (ELIQUIS) 5 MG TABS tablet Take 1 tablet (5 mg total) by mouth 2 (two) times daily. 05/09/20   Delfino Lovett, MD  aspirin EC 81 MG EC tablet Take 1 tablet (81 mg total) by mouth daily. 05/11/20   Delfino Lovett, MD  atorvastatin (LIPITOR) 40  MG tablet Take 1 tablet (40 mg total) by mouth daily. 05/09/20   Delfino Lovett, MD  clobetasol ointment (TEMOVATE) 0.05 % Apply 1 application topically daily as needed (skin irritation).  01/07/17   [provider]  Multiple Vitamin (MULTIVITAMIN WITH MINERALS) TABS tablet Take 1 tablet by mouth at bedtime.    [provider]  omeprazole (PRILOSEC) 20 MG capsule Take  20 mg by mouth every evening.  01/11/17   [provider]  ondansetron (ZOFRAN) 4 MG tablet Take 4 mg by mouth every 8 (eight) hours as needed. 04/18/20   [provider]  triamcinolone cream (KENALOG) 0.1 % Apply 1 application topically daily as needed (skin irritation.).  01/11/17   [provider]  valACYclovir (VALTREX) 500 MG tablet Take 500 mg by mouth every evening.  01/11/17   [provider]    Allergies Penicillins, Ciprofloxacin, and Ivp dye [iodinated diagnostic agents]  Family History  Problem Relation Age of Onset  . Varicose Veins Mother   . Cancer Father     Social History Social History   Tobacco Use  . Smoking status: Former Smoker    Packs/day: 1.50    Years: 20.00    Pack years: 30.00    Types: Cigarettes    Quit date: 1985    Years since quitting: 37.2  . Smokeless tobacco: Never Used  Vaping Use  . Vaping Use: Never used  Substance Use Topics  . Alcohol use: Yes    Comment: ocassionally  . Drug use: No    Review of Systems  Constitutional: No fever/chills Eyes: No visual changes. ENT: No sore throat. Cardiovascular: Denies chest pain. Respiratory: Denies shortness of breath. Gastrointestinal: No abdominal pain.  No nausea, no vomiting.  No diarrhea.  No constipation. Genitourinary: Negative for dysuria. Musculoskeletal: Negative for back pain. Skin: Negative for rash. Neurological: Negative for headaches.  Positive for right-sided weakness and gait change acutely  ____________________________________________   PHYSICAL EXAM:  VITAL SIGNS: Vitals:   04/01/21 1638 04/01/21 1730  BP: 130/87 (!) 156/101  Pulse: 77 77  Resp: 18 18  Temp:    SpO2: 97% 97%     Constitutional: Alert and oriented. Well appearing and in no acute distress.  Pleasant and conversational in full sentences.  Able to get up from the stretcher independently and ambulate, though with a stumbling gait.  He reports this is different.  No  falls and I assisted pt back to the bed. Eyes: Conjunctivae are normal. PERRL. EOMI. Head: Atraumatic. Nose: No congestion/rhinnorhea. Mouth/Throat: Mucous membranes are moist.  Oropharynx non-erythematous. Neck: No stridor. No cervical spine tenderness to palpation. Cardiovascular: Normal rate, regular rhythm. Grossly normal heart sounds.  Good peripheral circulation. Respiratory: Normal respiratory effort.  No retractions. Lungs CTAB. Gastrointestinal: Soft , nondistended, nontender to palpation. No CVA tenderness. Musculoskeletal: No lower extremity tenderness nor edema.  No joint effusions. No signs of acute trauma. Neurologic:   Right-sided pronator drift (4/5 strength to RUE) and dysmetria to the right upper extremity with finger-nose-finger are noted.  Gait instability as well, which patient reports is new. Otherwise, patient is neurologically intact Skin:  Skin is warm, dry and intact. No rash noted. Psychiatric: Mood and affect are normal. Speech and behavior are normal.  ____________________________________________   LABS (all labs ordered are listed, but only abnormal results are displayed)  Labs Reviewed  CBC - Abnormal; Notable for the following components:      Result Value   MCV 100.9 (*)  MCH 35.4 (*)    All other components within normal limits  DIFFERENTIAL - Abnormal; Notable for the following components:   Lymphs Abs 0.6 (*)    Abs Immature Granulocytes 0.11 (*)    All other components within normal limits  COMPREHENSIVE METABOLIC PANEL - Abnormal; Notable for the following components:   Glucose, Bld 143 (*)    All other components within normal limits  CBG MONITORING, ED - Abnormal; Notable for the following components:   Glucose-Capillary 125 (*)    All other components within normal limits  SARS CORONAVIRUS 2 (TAT 6-24 HRS)  PROTIME-INR  APTT  CBG MONITORING, ED   ____________________________________________  12 Lead EKG  Sinus rhythm, rate of 76  bpm.  Normal axis and intervals.  1 PVC.  No evidence of acute ischemia. ____________________________________________  RADIOLOGY  ED MD interpretation: CT head reviewed by me without evidence of acute intracranial pathology.  Official radiology report(s): CT HEAD WO CONTRAST  Result Date: 04/01/2021 CLINICAL DATA:  Mental status change. EXAM: CT HEAD WITHOUT CONTRAST TECHNIQUE: Contiguous axial images were obtained from the base of the skull through the vertex without intravenous contrast. COMPARISON:  MRI of the brain May 10, 2020. FINDINGS: Brain: No evidence of acute infarction, hemorrhage, hydrocephalus, extra-axial collection or mass lesion/mass effect. Area of encephalomalacia and gliosis in the left frontal lobe and insula corresponding to infarct seen on prior MRI. Vascular: No hyperdense vessel. Calcified plaques in the bilateral carotid siphons. Skull: Normal. Negative for fracture or focal lesion. Sinuses/Orbits: No acute finding. Other: None. IMPRESSION: 1. No acute intracranial findings. 2. Area of encephalomalacia and gliosis in the left frontal lobe and insula corresponding to infarct seen on prior MRI. Electronically Signed   By: Baldemar Lenis M.D.   On: 04/01/2021 16:00   MR BRAIN WO CONTRAST  Result Date: 04/01/2021 CLINICAL DATA:  Acute neuro deficit. Worsening right-sided weakness. EXAM: MRI HEAD WITHOUT CONTRAST TECHNIQUE: Multiplanar, multiecho pulse sequences of the brain and surrounding structures were obtained without intravenous contrast. COMPARISON:  CT head 04/01/2021.  MRI head 03/10/2020 FINDINGS: Brain: Acute infarct in the left hippocampus and left thalamus involving the left PCA territory. Small area of restricted diffusion in the high right frontal cortex measuring under 1 cm which may be subacute to acute infarct. Chronic infarct involving the left operculum and frontal lobe on the left. Few small white matter hyperintensities. Negative for hemorrhage,  mass, hydrocephalus Vascular: Negative for hyperdense vessel Skull and upper cervical spine: Negative Sinuses/Orbits: Mild mucosal edema paranasal sinuses. Right-sided cataract extraction Other: None IMPRESSION: Acute infarct left PCA territory involving the left hippocampus and left thalamus Probable small acute infarct in the high right parietal lobe. This suggests emboli. Chronic infarct left frontal lobe Electronically Signed   By: Marlan Palau M.D.   On: 04/01/2021 19:36    ____________________________________________   PROCEDURES and INTERVENTIONS  Procedure(s) performed (including Critical Care):  .1-3 Lead EKG Interpretation Performed by: Delton Prairie, MD Authorized by: Delton Prairie, MD     Interpretation: normal     ECG rate:  70   ECG rate assessment: normal     Rhythm: sinus rhythm     Ectopy: none     Conduction: normal      Medications  sodium chloride flush (NS) 0.9 % injection 3 mL (0 mLs Intravenous Hold 04/01/21 1619)  LORazepam (ATIVAN) tablet 1 mg (1 mg Oral Given 04/01/21 1702)  diphenhydrAMINE (BENADRYL) capsule 50 mg (has no administration in time range)  Or  diphenhydrAMINE (BENADRYL) injection 50 mg (has no administration in time range)  hydrocortisone sodium succinate (SOLU-CORTEF) injection 200 mg (200 mg Intravenous Given 04/01/21 1801)    ____________________________________________   MDM / ED COURSE   70 year old male with history of stroke and no residual deficits, presents with acute right-sided weakness, sensation changes and gait change, with evidence of acute CVA not amenable to intervention at this time, and requiring medical admission.  Normal vitals on room air.  Exam with NIH of about 3 due to dysmetria, right upper extremity weakness and pronator drift, as well as his gait disturbance.  No evidence of LVO and patient is not a TPA candidate due to his Eliquis usage.  CT head demonstrates no ICH or signs of acute CVA.  His EKG is nonischemic and  blood work is unremarkable.  I discussed the case with neurology, who recommends CTA head/neck and MRI of the brain.  Patient requires premedication protocol due to his contrast allergy documentation for CTA.  MRI obtained after I discussed the case with hospitalist for admission, and does demonstrate acute stroke.  We will admit to hospitalist medicine for stroke work-up and management.  Clinical Course as of 04/01/21 2026  Tue Apr 01, 2021  1630 Neurology paged [DS]  1635 I discuss the case with neurology on call, Dr. Otelia LimesLindzen, who recommends CTA head/neck and MRI brain  [DS]  1824 Reassessed.  Awaiting premedications due to his contrast allergy.  Reexamination reveals persistent dysmetria and pronator drift on the right.  We discussed my recommendations for medical admission.  Patient is agreeable. [DS]    Clinical Course User Index [DS] Delton PrairieSmith, Dalayla Aldredge, MD    ____________________________________________   FINAL CLINICAL IMPRESSION(S) / ED DIAGNOSES  Final diagnoses:  Cerebrovascular accident (CVA) due to other mechanism [facility](HCC)  Right sided weakness     ED Discharge Orders    None       Sha Burling   Note:  This document was prepared using Dragon voice recognition software and may include unintentional dictation errors.   Delton PrairieSmith, Kinga Cassar, MD 04/01/21 2119

## 2021-04-01 NOTE — ED Notes (Signed)
Patient transported to CT 

## 2021-04-01 NOTE — ED Notes (Signed)
Pt to CT at this time.

## 2021-04-01 NOTE — ED Notes (Signed)
Pt to MRI at this time.

## 2021-04-01 NOTE — ED Triage Notes (Signed)
Pt states he began having right sided numbness to the face, arm and leg since around 1030-11am today,. States he had a small HA this morning, pt states he is having difficulty walking, states he is drifting to the right . States he has a hx of stroke

## 2021-04-01 NOTE — H&P (Signed)
History and Physical   Terry [name]:[id number] DOB: 02/24/1951 DOA: 04/01/2021  Referring MD/NP/PA: Dr. Delton Prairie  PCP: Gracelyn Nurse, MD   Outpatient Specialists: [facility]  Patient coming from: Home  Chief Complaint: Right-sided weakness  HPI: Terry [name] is a 70 y.o. 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  He is supposed to be on statin and aspirin.  He has paroxysmal atrial fibrillation on Eliquis and he has had mitral valve replacement.  Dron his last CVA he had left MCA area had weakness and slurred speech.  Today he presents with another acute right-sided weakness and gait change.  He started about 10:30 in the morning.  Patient came to the ER outside the window for TPA administration.  He has been moving around independently without assistance.  He lives with his wife at home.  He has fully recovered from the previous stroke but today he became weak on the right side unable to move.  He was sitting down waiting for some contractors in the house when he developed a sensation of rapid change on the right side.  Then the weakness and paresthesias started.  He started stumbling.  He was seen in the ER evaluated MRI confirmed acute CVA.  Patient being admitted to the hospital for further work-up.  Neurology to see patient..  ED Course: Temperature 97.9, blood pressure 156/101, pulse 77, respiratory 18 oxygen sat 96% on room air.  CBC and chemistry all within normal.  COVID-19 screen is negative.  Head CT without contrast showed no acute findings.  CT angiogram of the head and neck showed no large vessel occlusion.  MRI of the brain showed acute infarct in the left PCA territory involving the left hippocampus and left thalamus.  There is probable small acute infarct in the right parietal lobe  probably embolization.  Patient will be admitted for further evaluation and treatment.  Review of Systems: As per HPI otherwise 10 point review of systems negative.    Past Medical History:  Diagnosis Date  . Diverticulosis   . GERD (gastroesophageal reflux disease)   . Heart murmur   . Hypercholesteremia   . Hypertension   . Mitral valve prolapse     Past Surgical History:  Procedure Laterality Date  . COLONOSCOPY WITH PROPOFOL N/A 03/06/2020   Procedure: COLONOSCOPY WITH PROPOFOL;  Surgeon: Toledo, Boykin Nearing, MD;  Location: [facility] ENDOSCOPY;  Service: Gastroenterology;  Laterality: N/A;  . MITRAL VALVE REPLACEMENT    . RETINAL DETACHMENT SURGERY    . RIGHT/LEFT HEART CATH AND CORONARY ANGIOGRAPHY Bilateral 02/18/2017   Procedure: Right/Left Heart Cath and Coronary Angiography;  Surgeon: Marcina Millard, MD;  Location: [facility] INVASIVE CV LAB;  Service: Cardiovascular;  Laterality: Bilateral;  . RIGHT/LEFT HEART CATH AND CORONARY ANGIOGRAPHY N/A 02/22/2020   Procedure: RIGHT/LEFT HEART CATH AND CORONARY ANGIOGRAPHY;  Surgeon: Marcina Millard, MD;  Location: [facility] INVASIVE CV LAB;  Service: Cardiovascular;  Laterality: N/A;  . TEMPOROMANDIBULAR JOINT SURGERY       reports that he quit smoking about 37 years ago. His smoking use included cigarettes. He has a 30.00 pack-year smoking history. He has never used smokeless tobacco. He reports current alcohol use. He reports that he does not use drugs.  Allergies  Allergen Reactions  . Penicillins Shortness Of Breath    Did  it involve swelling of the face/tongue/throat, SOB, or low BP? Yes Did it involve sudden or severe rash/hives, skin peeling, or any reaction on the inside of your mouth or nose? Unknown Did you need to seek medical attention at a hospital or doctor's office? Was at a clinic when reaction occurred When did it last happen?More than 50 years ago If all above answers are "NO", may proceed with cephalosporin use.   .  Ciprofloxacin Swelling and Rash  . Ivp Dye [Iodinated Diagnostic Agents] Rash    Family History  Problem Relation Age of Onset  . Varicose Veins Mother   . Cancer Father      Prior to Admission medications   Medication Sig Start Date End Date Taking? Authorizing Provider  amiodarone (PACERONE) 200 MG tablet Take 1 tablet (200 mg total) by mouth daily. 05/09/20   Delfino Lovett, MD  apixaban (ELIQUIS) 5 MG TABS tablet Take 1 tablet (5 mg total) by mouth 2 (two) times daily. 05/09/20   Delfino Lovett, MD  aspirin EC 81 MG EC tablet Take 1 tablet (81 mg total) by mouth daily. 05/11/20   Delfino Lovett, MD  atorvastatin (LIPITOR) 40 MG tablet Take 1 tablet (40 mg total) by mouth daily. 05/09/20   Delfino Lovett, MD  clobetasol ointment (TEMOVATE) 0.05 % Apply 1 application topically daily as needed (skin irritation).  01/07/17   [provider]  Multiple Vitamin (MULTIVITAMIN WITH MINERALS) TABS tablet Take 1 tablet by mouth at bedtime.    [provider]  omeprazole (PRILOSEC) 20 MG capsule Take 20 mg by mouth every evening.  01/11/17   [provider]  ondansetron (ZOFRAN) 4 MG tablet Take 4 mg by mouth every 8 (eight) hours as needed. 04/18/20   [provider]  triamcinolone cream (KENALOG) 0.1 % Apply 1 application topically daily as needed (skin irritation.).  01/11/17   [provider]  valACYclovir (VALTREX) 500 MG tablet Take 500 mg by mouth every evening.  01/11/17   [provider]    Physical Exam: Vitals:   04/01/21 1730 04/01/21 2030 04/01/21 2100 04/01/21 2252  BP: (!) 156/101 140/86 (!) 130/97 (!) 131/97  Pulse: 77 72 69 76  Resp: Temp:    97.9 F (36.6 C)  TempSrc:    Oral  SpO2: 97% 99% 96% 97%  Weight:      Height:          Constitutional: Acutely ill looking, no distress Vitals:   04/01/21 1730 04/01/21 2030 04/01/21 2100 04/01/21 2252  BP: (!) 156/101 140/86 (!) 130/97 (!) 131/97  Pulse: 77 72 69 76  Resp: Temp:    97.9 F (36.6 C)  TempSrc:    Oral  SpO2: 97% 99% 96% 97%  Weight:      Height:       Eyes: PERRL, lids and conjunctivae normal ENMT: Mucous membranes are moist. Posterior pharynx clear of any exudate or lesions.Normal dentition.  Neck: normal, supple, no masses, no thyromegaly Respiratory: clear to auscultation bilaterally, no wheezing, no crackles. Normal respiratory effort. No accessory muscle use.  Cardiovascular: Regular rate and rhythm, no murmurs / rubs / gallops. No extremity edema. 2+ pedal pulses. No carotid bruits.  Abdomen: no tenderness, no masses palpated. No hepatosplenomegaly. Bowel sounds positive.  Musculoskeletal: no clubbing / cyanosis. No joint deformity upper and lower extremities. Good ROM, no contractures. Normal muscle tone.  Skin: no rashes, lesions, ulcers. No induration  Neurologic: CN 2-12 grossly intact. Sensation intact, DTR normal. Strength 5/5 in all 4.  Abnormal gait and disconjugate gaze Psychiatric: Normal judgment and insight. Alert and oriented x 3. Normal mood.     Labs on Admission: I have personally reviewed following labs and imaging studies  CBC: Recent Labs  Lab 04/01/21 1529  WBC 7.6  NEUTROABS 6.6  HGB 16.1  HCT 45.9  MCV 100.9*  PLT 151   Basic Metabolic Panel: Recent Labs  Lab 04/01/21 1529  NA 137  K 4.1  CL 103  CO2 22  GLUCOSE 143*  BUN 21  CREATININE 0.95  CALCIUM 9.5   GFR: Estimated Creatinine Clearance: 72.5 mL/min (by C-G formula based on SCr of 0.95 mg/dL). Liver Function Tests: Recent Labs  Lab 04/01/21 1529  AST 33  ALT 42  ALKPHOS 57  BILITOT 1.2  PROT 7.1  ALBUMIN 4.5   No results for input(s): LIPASE, AMYLASE in the last 168 hours. No results for input(s): AMMONIA in the last 168 hours. Coagulation Profile: Recent Labs  Lab 04/01/21 1529  INR 1.1   Cardiac Enzymes: No results for input(s): CKTOTAL, CKMB, CKMBINDEX, TROPONINI in the last 168 hours. BNP (last 3  results) No results for input(s): PROBNP in the last 8760 hours. HbA1C: No results for input(s): HGBA1C in the last 72 hours. CBG: Recent Labs  Lab 04/01/21 1517  GLUCAP 125*   Lipid Profile: No results for input(s): CHOL, HDL, LDLCALC, TRIG, CHOLHDL, LDLDIRECT in the last 72 hours. Thyroid Function Tests: No results for input(s): TSH, T4TOTAL, FREET4, T3FREE, THYROIDAB in the last 72 hours. Anemia Panel: No results for input(s): VITAMINB12, FOLATE, FERRITIN, TIBC, IRON, RETICCTPCT in the last 72 hours. Urine analysis: No results found for: COLORURINE, APPEARANCEUR, LABSPEC, PHURINE, GLUCOSEU, HGBUR, BILIRUBINUR, KETONESUR, PROTEINUR, UROBILINOGEN, NITRITE, LEUKOCYTESUR Sepsis Labs: @LABRCNTIP (procalcitonin:4,lacticidven:4) )No results found for this or any previous visit (from the past 240 hour(s)).   Radiological Exams on Admission: CT Angio Head W or Wo Contrast  Result Date: 04/01/2021 CLINICAL DATA:  Follow-up examination for acute stroke. EXAM: CT ANGIOGRAPHY HEAD AND NECK TECHNIQUE: Multidetector CT imaging of the head and neck was performed using the standard protocol during bolus administration of intravenous contrast. Multiplanar CT image reconstructions and MIPs were obtained to evaluate the vascular anatomy. Carotid stenosis measurements (when applicable) are obtained utilizing NASCET criteria, using the distal internal carotid diameter as the denominator. CONTRAST:  24mL OMNIPAQUE IOHEXOL 350 MG/ML SOLN COMPARISON:  Prior CT and MRI from earlier the same day. FINDINGS: CTA NECK FINDINGS Aortic arch: Visualized aortic arch normal in caliber with normal branch pattern. Mild atheromatous change about the arch and origin of the great vessels without hemodynamically significant stenosis. Right carotid system: Right CCA patent from its origin to the bifurcation without stenosis. Eccentric calcified plaque about the right carotid bulb/proximal right ICA without significant stenosis.  Right ICA patent distally without stenosis, dissection or occlusion. Left carotid system: Left CCA patent from its origin to the bifurcation without stenosis. Scattered calcified plaque about the left carotid bulb/proximal left ICA without significant stenosis. Left ICA patent distally without stenosis, dissection or occlusion. Vertebral arteries: Both vertebral arteries arise from the subclavian arteries. No proximal subclavian artery stenosis. Vertebral arteries patent within the neck without stenosis, dissection or occlusion. Skeleton: No visible acute osseous finding. No discrete or worrisome osseous lesions. Patient is largely edentulous. Other neck: No other acute soft tissue abnormality within the neck. No mass or adenopathy. Upper chest: Visualized upper chest demonstrates no  acute finding. Review of the MIP images confirms the above findings CTA HEAD FINDINGS Anterior circulation: Petrous segments widely patent. Mild for age atheromatous change within the carotid siphons without stenosis. A1 segments widely patent. Normal anterior communicating artery complex. Anterior cerebral arteries widely patent to their distal aspects. No M1 stenosis or occlusion. Normal MCA bifurcations. Distal MCA branches well perfused and symmetric. Posterior circulation: Both V4 segments widely patent to the vertebrobasilar junction without stenosis. Both PICA origins patent and normal. Basilar widely patent to its distal aspect without stenosis. Superior cerebellar arteries patent bilaterally. Both PCAs primarily supplied via the basilar. Right PCA widely patent to its distal aspect. There is abrupt occlusion of the left PCA at the left P1/P2 junction (series 5, image 265). Venous sinuses: Grossly patent allowing for timing the contrast bolus. Anatomic variants: None significant.  No aneurysm. Review of the MIP images confirms the above findings IMPRESSION: 1. 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 2. Mild  atheromatous disease elsewhere about the major arterial vasculature of the head and neck as above. No other hemodynamically significant or correctable stenosis. Electronically Signed   By: Rise MuBenjamin  McClintock M.D.   On: 04/01/2021 22:48   CT HEAD WO CONTRAST  Result Date: 04/01/2021 CLINICAL DATA:  Mental status change. EXAM: CT HEAD WITHOUT CONTRAST TECHNIQUE: Contiguous axial images were obtained from the base of the skull through the vertex without intravenous contrast. COMPARISON:  MRI of the brain May 10, 2020. FINDINGS: Brain: No evidence of acute infarction, hemorrhage, hydrocephalus, extra-axial collection or mass lesion/mass effect. Area of encephalomalacia and gliosis in the left frontal lobe and insula corresponding to infarct seen on prior MRI. Vascular: No hyperdense vessel. Calcified plaques in the bilateral carotid siphons. Skull: Normal. Negative for fracture or focal lesion. Sinuses/Orbits: No acute finding. Other: None. IMPRESSION: 1. No acute intracranial findings. 2. Area of encephalomalacia and gliosis in the left frontal lobe and insula corresponding to infarct seen on prior MRI. Electronically Signed   By: Baldemar LenisKatyucia  De Macedo Rodrigues M.D.   On: 04/01/2021 16:00   CT Angio Neck W and/or Wo Contrast  Result Date: 04/01/2021 CLINICAL DATA:  Follow-up examination for acute stroke. EXAM: CT ANGIOGRAPHY HEAD AND NECK TECHNIQUE: Multidetector CT imaging of the head and neck was performed using the standard protocol during bolus administration of intravenous contrast. Multiplanar CT image reconstructions and MIPs were obtained to evaluate the vascular anatomy. Carotid stenosis measurements (when applicable) are obtained utilizing NASCET criteria, using the distal internal carotid diameter as the denominator. CONTRAST:  75mL OMNIPAQUE IOHEXOL 350 MG/ML SOLN COMPARISON:  Prior CT and MRI from earlier the same day. FINDINGS: CTA NECK FINDINGS Aortic arch: Visualized aortic arch normal in caliber  with normal branch pattern. Mild atheromatous change about the arch and origin of the great vessels without hemodynamically significant stenosis. Right carotid system: Right CCA patent from its origin to the bifurcation without stenosis. Eccentric calcified plaque about the right carotid bulb/proximal right ICA without significant stenosis. Right ICA patent distally without stenosis, dissection or occlusion. Left carotid system: Left CCA patent from its origin to the bifurcation without stenosis. Scattered calcified plaque about the left carotid bulb/proximal left ICA without significant stenosis. Left ICA patent distally without stenosis, dissection or occlusion. Vertebral arteries: Both vertebral arteries arise from the subclavian arteries. No proximal subclavian artery stenosis. Vertebral arteries patent within the neck without stenosis, dissection or occlusion. Skeleton: No visible acute osseous finding. No discrete or worrisome osseous lesions. Patient is  largely edentulous. Other neck: No other acute soft tissue abnormality within the neck. No mass or adenopathy. Upper chest: Visualized upper chest demonstrates no acute finding. Review of the MIP images confirms the above findings CTA HEAD FINDINGS Anterior circulation: Petrous segments widely patent. Mild for age atheromatous change within the carotid siphons without stenosis. A1 segments widely patent. Normal anterior communicating artery complex. Anterior cerebral arteries widely patent to their distal aspects. No M1 stenosis or occlusion. Normal MCA bifurcations. Distal MCA branches well perfused and symmetric. Posterior circulation: Both V4 segments widely patent to the vertebrobasilar junction without stenosis. Both PICA origins patent and normal. Basilar widely patent to its distal aspect without stenosis. Superior cerebellar arteries patent bilaterally. Both PCAs primarily supplied via the basilar. Right PCA widely patent to its distal aspect. There is  abrupt occlusion of the left PCA at the left P1/P2 junction (series 5, image 265). Venous sinuses: Grossly patent allowing for timing the contrast bolus. Anatomic variants: None significant.  No aneurysm. Review of the MIP images confirms the above findings IMPRESSION: 1. 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 2. Mild atheromatous disease elsewhere about the major arterial vasculature of the head and neck as above. No other hemodynamically significant or correctable stenosis. Electronically Signed   By: Rise Mu M.D.   On: 04/01/2021 22:48   MR BRAIN WO CONTRAST  Result Date: 04/01/2021 CLINICAL DATA:  Acute neuro deficit. Worsening right-sided weakness. EXAM: MRI HEAD WITHOUT CONTRAST TECHNIQUE: Multiplanar, multiecho pulse sequences of the brain and surrounding structures were obtained without intravenous contrast. COMPARISON:  CT head 04/01/2021.  MRI head 03/10/2020 FINDINGS: Brain: Acute infarct in the left hippocampus and left thalamus involving the left PCA territory. Small area of restricted diffusion in the high right frontal cortex measuring under 1 cm which may be subacute to acute infarct. Chronic infarct involving the left operculum and frontal lobe on the left. Few small white matter hyperintensities. Negative for hemorrhage, mass, hydrocephalus Vascular: Negative for hyperdense vessel Skull and upper cervical spine: Negative Sinuses/Orbits: Mild mucosal edema paranasal sinuses. Right-sided cataract extraction Other: None IMPRESSION: Acute infarct left PCA territory involving the left hippocampus and left thalamus Probable small acute infarct in the high right parietal lobe. This suggests emboli. Chronic infarct left frontal lobe Electronically Signed   By: Marlan Palau M.D.   On: 04/01/2021 19:36    EKG: Independently reviewed.  It shows normal sinus rhythm with a rate of 76.  No significant ST changes.  Assessment/Plan Principal Problem:   CVA (cerebral  vascular accident) (HCC) Active Problems:   GERD (gastroesophageal reflux disease)   Hypercholesteremia   Hypertension   History of mitral valve replacement   CKD (chronic kidney disease), stage III (HCC)     #1 acute left PCA territory infarct: This is new.  Same territory with last year.  Patient has been on statin and aspirin.  Will get neurology consult.  Appears to be embolic.  Has paroxysmal A. fib supposed to be on chronic anticoagulation.  Patient said he has been taking his anticoagulation.  This may need to be reviewed.  In the meantime admit the patient on follow protocol.  Repeat echocardiogram as well as carotid Dopplers.  Follow neurology recommendations.  #2 GERD: Continue PPIs  #3 essential hypertension: Continue home regimen.  #4 chronic kidney disease stage III: Appears to be at baseline.  #5 paroxysmal atrial fibrillation: Currently in sinus rhythm.  Continue Eliquis as well as rate control.  #6  hyperlipidemia: On Lipitor.  Currently at 40 mg.  Dose may need to be increased to 80 mg.  Will defer to neurology.   DVT prophylaxis: Eliquis Code Status: Full code Family Communication: No family at bedside Disposition Plan: Home Consults called: None but Dr. Otelia Limes contacted by the ER.  Neurology to see in the morning Admission status: Inpatient  Severity of Illness: The appropriate patient status for this patient is INPATIENT. Inpatient status is judged to be reasonable and necessary in order to provide the required intensity of service to ensure the patient's safety. The patient's presenting symptoms, physical exam findings, and initial radiographic and laboratory data in the context of their chronic comorbidities is felt to place them at high risk for further clinical deterioration. Furthermore, it is not anticipated that the patient will be medically stable for discharge from the hospital within 2 midnights of admission. The following factors support the patient  status of inpatient.   " The patient's presenting symptoms include right-sided weakness. " The worrisome physical exam findings include right hemiparesis. " The initial radiographic and laboratory data are worrisome because of MRI positive for acute CVA. " The chronic co-morbidities include previous CVA hypertension A. fib.   * I certify that at the point of admission it is my clinical judgment that the patient will require inpatient hospital care spanning beyond 2 midnights from the point of admission due to high intensity of service, high risk for further deterioration and high frequency of surveillance required.Lonia Blood MD [facility] Pager [phone]  If 7PM-7AM, please contact night-coverage [email or web address] Password [email or web address]  04/01/2021, 11:10 PM

## 2021-04-02 ENCOUNTER — Inpatient Hospital Stay
Admit: 2021-04-02 | Discharge: 2021-04-02 | Disposition: A | Discharge status: Home / Self Care | Payer: Medicare HMO | Attending: Internal Medicine | Admitting: Internal Medicine

## 2021-04-02 DIAGNOSIS — I639 Cerebral infarction, unspecified: Secondary | ICD-10-CM

## 2021-04-02 DIAGNOSIS — I6389 Other cerebral infarction: Secondary | ICD-10-CM

## 2021-04-02 LAB — COMPREHENSIVE METABOLIC PANEL
ALT: 32 U/L (ref 0–44)
AST: 23 U/L (ref 15–41)
Albumin: 3.6 g/dL (ref 3.5–5.0)
Alkaline Phosphatase: 44 U/L (ref 38–126)
Anion gap: 9 (ref 5–15)
BUN: 22 mg/dL (ref 8–23)
CO2: 23 mmol/L (ref 22–32)
Calcium: 9.2 mg/dL (ref 8.9–10.3)
Chloride: 106 mmol/L (ref 98–111)
Creatinine, Ser: 0.97 mg/dL (ref 0.61–1.24)
GFR, Estimated: >60 mL/min (ref >60)
Glucose, Bld: 113 mg/dL — ABNORMAL HIGH (ref 70–99)
Potassium: 3.6 mmol/L (ref 3.5–5.1)
Sodium: 138 mmol/L (ref 135–145)
Total Bilirubin: 1.1 mg/dL (ref 0.3–1.2)
Total Protein: 6.2 g/dL — ABNORMAL LOW (ref 6.5–8.1)

## 2021-04-02 LAB — CBC
HCT: 43.4 % (ref 39.0–52.0)
Hemoglobin: 15.6 g/dL (ref 13.0–17.0)
MCH: 35.9 pg — ABNORMAL HIGH (ref 26.0–34.0)
MCHC: 35.9 g/dL (ref 30.0–36.0)
MCV: 100 fL (ref 80.0–100.0)
Platelets: 153 10*3/uL (ref 150–400)
RBC: 4.34 MIL/uL (ref 4.22–5.81)
RDW: 12.2 % (ref 11.5–15.5)
WBC: 11.9 10*3/uL — ABNORMAL HIGH (ref 4.0–10.5)
nRBC: 0 % (ref 0.0–0.2)

## 2021-04-02 LAB — LIPID PANEL
Cholesterol: 186 mg/dL (ref 0–200)
HDL: 53 mg/dL (ref >40)
LDL Cholesterol: 110 mg/dL — ABNORMAL HIGH (ref 0–99)
Total CHOL/HDL Ratio: 3.5 RATIO
Triglycerides: 115 mg/dL (ref <150)
VLDL: 23 mg/dL (ref 0–40)

## 2021-04-02 LAB — ECHOCARDIOGRAM COMPLETE
AR max vel: 2.7 cm2
AV Area VTI: 2.86 cm2
AV Area mean vel: 2.79 cm2
AV Mean grad: 9 mmHg
AV Peak grad: 18.3 mmHg
Ao pk vel: 2.14 m/s
Area-P 1/2: 2.47 cm2
Calc EF: 47.1 %
Height: 67 in
MV VTI: 2.87 cm2
Single Plane A2C EF: 50.7 %
Single Plane A4C EF: 45.2 %
Weight: 2848 oz

## 2021-04-02 LAB — HEMOGLOBIN A1C
Hgb A1c MFr Bld: 5.6 % (ref 4.8–5.6)
Mean Plasma Glucose: 114.02 mg/dL

## 2021-04-02 LAB — SARS CORONAVIRUS 2 (TAT 6-24 HRS): SARS Coronavirus 2: NEGATIVE

## 2021-04-02 MED ORDER — PANTOPRAZOLE SODIUM 40 MG PO TBEC
40.0000 mg | DELAYED_RELEASE_TABLET | Freq: Every day | ORAL | Status: DC
  Administered 2021-04-02 – 2021-04-03 (×2): 40 mg via ORAL
  Filled 2021-04-02 (×2): qty 1

## 2021-04-02 NOTE — Progress Notes (Signed)
*  PRELIMINARY RESULTS* Echocardiogram 2D Echocardiogram has been performed.  Terry [name] 04/02/2021, 9:41 AM

## 2021-04-02 NOTE — Evaluation (Signed)
Physical Therapy Evaluation Patient Details Name: Terry [name] MRN: [id number] DOB: 02/19/1951 Today's Date: 04/02/2021   History of Present Illness  presented to ER secondary to acute onset of R-sided weakness, paresthesia; admitted for TIA/CVA work up.  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  Clinical Impression  Upon evaluation, patient alert and oriented; follows commands and agreeable to participation with session.  Eager for mobility efforts and progression towards discharge home.  Does endorse some continued numbness in fingertips of R hand and R face, as well as mild coordination/fine motor control deficits, but feels symptoms have significantly improved since presentation to the hospital.  Able to complete sit/stand, basic transfers and gait (200') without assist device, cga/min assist.  Demonstrates reciprocal stepping pattern with good step height/length bilat; R lateral LOB x1 with change of direction, self-corrected with LE step strategy.  Mild deviations (increase sway, veers R) with dynamic gait components (modified DGI 8/12), but able to self-correct without physical assist from therapist Would benefit from skilled PT to address above deficits and promote optimal return to PLOF.; recommend transition to home with outpatient PT/OT follow up as appropriate.    Follow Up Recommendations Outpatient PT    Equipment Recommendations       Recommendations for Other Services       Precautions / Restrictions Precautions Precautions: Fall Restrictions Weight Bearing Restrictions: No      Mobility  Bed Mobility               General bed mobility comments: seated in recliner beginning/end of treatment session    Transfers Overall transfer level: Needs assistance   Transfers: Sit to/from Stand Sit to Stand: Modified independent (Device/Increase time)         General transfer comment: good LE  strength/stability  Ambulation/Gait Ambulation/Gait assistance: Min guard Gait Distance (Feet): 200 Feet Assistive device: None       General Gait Details: reciprocal stepping pattern with good step height/length bilat; R lateral LOB x1 with change of direction, self-corrected with LE step strategy.  Mild deviations (increase sway, veers R) with dynamic gait components (modified DGI 8/12), but able to self-correct without physical assist from therapist  Stairs            Wheelchair Mobility    Modified Rankin (Stroke Patients Only)       Balance Overall balance assessment: Needs assistance Sitting-balance support: No upper extremity supported;Feet supported Sitting balance-Leahy Scale: Good     Standing balance support: No upper extremity supported Standing balance-Leahy Scale: Fair                               Pertinent Vitals/Pain Pain Assessment: No/denies pain    Home Living Family/patient expects to be discharged to:: Private residence Living Arrangements: Spouse/significant other Available Help at Discharge: Family Type of Home: House Home Access: Stairs to enter Entrance Stairs-Rails: Right;Left;Can reach both Entrance Stairs-Number of Steps: 4 Home Layout: One level Home Equipment: Cane - single point;Walker - 4 wheels      Prior Function Level of Independence: Independent         Comments: Indep with ADLs, household and community mobilization without assist device. + driving.     Hand Dominance   Dominant Hand: Right    Extremity/Trunk Assessment   Upper Extremity Assessment Upper Extremity Assessment: Overall WFL for tasks assessed (mild paresthesia fingertips of R  hand, mild fine motor/coordination deficits to R hand; L UE grossly WFL)    Lower Extremity Assessment Lower Extremity Assessment: Overall WFL for tasks assessed (intermittent neuropathy mid-calf distally, unchanged with this admission; strength grossly 5/5  throughout)       Communication   Communication: No difficulties  Cognition Arousal/Alertness: Awake/alert Behavior During Therapy: WFL for tasks assessed/performed Overall Cognitive Status: Within Functional Limits for tasks assessed                                        General Comments      Exercises     Assessment/Plan    PT Assessment Patient needs continued PT services  PT Problem List Decreased activity tolerance;Decreased balance;Decreased strength;Decreased mobility;Decreased coordination       PT Treatment Interventions DME instruction;Gait training;Stair training;Functional mobility training;Therapeutic activities;Balance training;Therapeutic exercise;Patient/family education;Neuromuscular re-education    PT Goals (Current goals can be found in the Care Plan section)  Acute Rehab PT Goals Patient Stated Goal: to go home! PT Goal Formulation: With patient Time For Goal Achievement: 04/16/21 Potential to Achieve Goals: Good    Frequency Min 2X/week   Barriers to discharge        Co-evaluation               AM-PAC PT "6 Clicks" Mobility  Outcome Measure Help needed turning from your back to your side while in a flat bed without using bedrails?: None Help needed moving from lying on your back to sitting on the side of a flat bed without using bedrails?: None Help needed moving to and from a bed to a chair (including a wheelchair)?: None Help needed standing up from a chair using your arms (e.g., wheelchair or bedside chair)?: None Help needed to walk in hospital room?: A Little Help needed climbing 3-5 steps with a railing? : A Little 6 Click Score: 22    End of Session Equipment Utilized During Treatment: Gait belt Activity Tolerance: Patient tolerated treatment well Patient left: in chair;with call bell/phone within reach (fall risk score 6, alarm not required)   PT Visit Diagnosis: Difficulty in walking, not elsewhere  classified (R26.2)    Time: 8119-1478 PT Time Calculation (min) (ACUTE ONLY): 14 min   Charges:   PT Evaluation $PT Eval Moderate Complexity: 1 Mod          Berdina Cheever H. Manson Passey, PT, DPT, NCS 04/02/21, 2:51 PM [phone]

## 2021-04-02 NOTE — Progress Notes (Signed)
SLP Cancellation Note  Patient Details Name: Terry [name] MRN: [id number] DOB: 02-23-1951   Cancelled treatment:       Reason Eval/Treat Not Completed: SLP screened, no needs identified, will sign off (chart reviewed; consulted NSG, CM then pt). Pt denied any difficulty swallowing and is currently on a regular diet; tolerates swallowing pills w/ water per NSG. Pt conversed at conversational level w/ this SLP w/out gross deficits noted; pt was fully intelligible. Pt described previous Outpatient ST services for speech-language issues d/t previous CVA -- he endorsed following strategies of slowing his rate of speech and pausing intermittently to address any word-finding issues. Discussed w/ pt that using these strategies can aid intelligibility during conversation, and he agreed. He denied need for any f/u w/ ST services post d/c.   No further skilled ST services indicated as pt appears at his baseline. Pt agreed. NSG to reconsult if any change in status.      Jerilynn Som, MS, CCC-SLP Speech Language Pathologist Rehab Services [phone] [facility] 04/02/2021, 10:53 AM

## 2021-04-02 NOTE — Evaluation (Addendum)
Occupational Therapy Evaluation Patient Details Name: PAULA ZIETZ MRN: [id number] DOB: 03-07-1951 Today's Date: 04/02/2021    History of Present Illness presented to ER secondary to acute onset of R-sided weakness, paresthesia; admitted for TIA/CVA work up.  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   Clinical Impression   Mr. Heidelberg presents today with numbness in R face and mildly reduced sensation in fingertips of R hand. Strength and ROM in UE and LE WFL and grossly symmetrical bilaterally. Despite a slight feeling of numbness in R fingers, pt is able to use his R hand to open food and toiletry containers, hold utensils, self feed, tie shoes, manipulate snaps, and fold clothing. He is Mod IND in bed mobility and transfers and reports that he believes his fxl mobility today is at his baseline level. He is alert and oriented x 4, talkative, appropriate, eager to engage in therapy. He reports participating in a group hike every weekend, working out in a gym several days a week, and walking several miles on days he does not go to the gym. Pt and therapist agree that no further OT services are required at this time.     Follow Up Recommendations  No OT follow up    Equipment Recommendations       Recommendations for Other Services       Precautions / Restrictions Precautions Precautions: Fall Restrictions Weight Bearing Restrictions: No      Mobility Bed Mobility Overal bed mobility: Modified Independent             General bed mobility comments: increased time    Transfers Overall transfer level: Needs assistance Equipment used: Rolling walker (2 wheeled) Transfers: Sit to/from [employer] Transfers Sit to Stand: Modified independent (Device/Increase time) Stand pivot transfers: Modified independent (Device/Increase time)       General transfer comment: good LE strength/stability    Balance Overall  balance assessment: Needs assistance Sitting-balance support: No upper extremity supported;Feet supported Sitting balance-Leahy Scale: Good     Standing balance support: Bilateral upper extremity supported Standing balance-Leahy Scale: Good                             ADL either performed or assessed with clinical judgement   ADL Overall ADL's : Modified independent                                             Vision Baseline Vision/History:  (Hx of detached retina, R eye) Patient Visual Report: No change from baseline       Perception     Praxis      Pertinent Vitals/Pain Pain Assessment: No/denies pain     Hand Dominance Right   Extremity/Trunk Assessment Upper Extremity Assessment Upper Extremity Assessment: Overall WFL for tasks assessed;RUE deficits/detail RUE Sensation: decreased light touch (mild) RUE Coordination: decreased fine motor (mild)   Lower Extremity Assessment Lower Extremity Assessment: Overall WFL for tasks assessed   Cervical / Trunk Assessment Cervical / Trunk Assessment: Normal   Communication Communication Communication: No difficulties   Cognition Arousal/Alertness: Awake/alert Behavior During Therapy: WFL for tasks assessed/performed Overall Cognitive Status: Within Functional Limits for tasks assessed  General Comments       Exercises     Shoulder Instructions      Home Living Family/patient expects to be discharged to:: Private residence Living Arrangements: Spouse/significant other Available Help at Discharge: Family Type of Home: House Home Access: Stairs to enter [employer] of Steps: 4 Entrance Stairs-Rails: Right;Left;Can reach both Home Layout: One level     Bathroom Shower/Tub: Chief Strategy Officer: Standard     Home Equipment: Environmental consultant - 2 wheels;Cane - single point   Additional Comments: Pt has SPC and RW  at home but reports ambulating w/o AD      Prior Functioning/Environment Level of Independence: Independent        Comments: Indep with ADLs, household and community mobilization without assist device. + driving.        OT Problem List: Decreased strength;Decreased activity tolerance;Impaired balance (sitting and/or standing);Impaired sensation      OT Treatment/Interventions:      OT Goals(Current goals can be found in the care plan section) Acute Rehab OT Goals Patient Stated Goal: to go home! OT Goal Formulation: With patient Time For Goal Achievement: 04/16/21 Potential to Achieve Goals: Good  OT Frequency:     Barriers to D/C:            Co-evaluation              AM-PAC OT "6 Clicks" Daily Activity     Outcome Measure Help from another person eating meals?: None Help from another person taking care of personal grooming?: None Help from another person toileting, which includes using toliet, bedpan, or urinal?: A Little Help from another person bathing (including washing, rinsing, drying)?: None Help from another person to put on and taking off regular upper body clothing?: None Help from another person to put on and taking off regular lower body clothing?: A Little 6 Click Score: 22   End of Session Equipment Utilized During Treatment: Rolling walker  Activity Tolerance: Patient tolerated treatment well Patient left: in chair;with call bell/phone within reach  OT Visit Diagnosis: Unsteadiness on feet (R26.81);Muscle weakness (generalized) (M62.81)                Time: 1010-1037 OT Time Calculation (min): 27 min Charges:  OT General Charges $OT Visit: 1 Visit OT Evaluation $OT Eval Low Complexity: 1 Low OT Treatments $Self Care/Home Management : 8-22 mins  Latina Craver, PhD, MS, OTR/L 04/02/21, 4:34 PM

## 2021-04-02 NOTE — Progress Notes (Signed)
PROGRESS NOTE    Terry HarrisonLarry K [name]  ZOX:[id number] DOB: 05-08-1951 DOA: 04/01/2021 PCP: Gracelyn NurseJohnston, [name], MD     Brief Narrative:  Terry [name] is a 70 y.o. 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  He is on statin and baby aspirin.  He has paroxysmal atrial fibrillation on Eliquis and he has had mitral valve replacement. He now presents with new onset of right-sided weakness. He was sitting down waiting for some contractors in the house when he developed a sensation of rapid change on the right side.  Then the weakness and paresthesias started.  He started stumbling.  He was seen in the ER evaluated MRI confirmed acute CVA.  New events last 24 hours / Subjective: Patient admits that he continues to have right facial numbness.  His symptoms of right-sided weakness has improved.  Overall feeling better since admission.  He also admits to feeling like he was a little confused prior to admission.  Assessment & Plan:   Principal Problem:   CVA (cerebral vascular accident) (HCC) Active Problems:   GERD (gastroesophageal reflux disease)   Hypercholesteremia   Hypertension   History of mitral valve replacement   CKD (chronic kidney disease), stage III (HCC)   Acute CVA -MRI brain: Acute infarct left PCA territory involving the left hippocampus and left thalamus. Probable small acute infarct in the high right parietal lobe. This suggests emboli. Chronic infarct left frontal lobe -Has been on Eliquis, baby aspirin, Lipitor -Neurology consulted -Echo, carotid Doppler pending -PT OT SLP   Paroxysmal atrial fibrillation -Normal sinus rhythm this morning -Continue amiodarone, Eliquis  GERD -PPI     DVT prophylaxis:   apixaban (ELIQUIS) tablet 5 mg  Code Status: Full code Family  Communication: No family at bedside Disposition Plan:  Status is: Inpatient  Remains inpatient appropriate because:Ongoing diagnostic testing needed not appropriate for outpatient work up   Dispo: The patient is from: Home              Anticipated d/c is to: Home              Patient currently is not medically stable to d/c.  Pending neurology consultation, further stroke work-up   Difficult to place patient No   Consultants:   Neurology  Antimicrobials:  Anti-infectives (From admission, onward)   Start     Dose/Rate Route Frequency Ordered Stop   04/02/21 1800  valACYclovir (VALTREX) tablet 500 mg        500 mg Oral Every evening 04/01/21 2304          Objective: Vitals:   04/01/21 2100 04/01/21 2252 04/02/21 0446 04/02/21 0750  BP: (!) 130/97 (!) 131/97 111/89 122/86  Pulse: 69 76 77 68  Resp: 18  17 16   Temp:  97.9 F (36.6 C) (!) 97.4 F (36.3 C) (!) 97.5 F (36.4 C)  TempSrc:  Oral Oral Oral  SpO2: 96% 97% 95% 98%  Weight:      Height:       No intake or output data in the 24 hours ending 04/02/21 1013 Filed Weights   04/01/21 1512  Weight: 80.7 kg    Examination:  General exam: Appears calm and comfortable  Respiratory system: Clear to auscultation. Respiratory effort normal. No respiratory distress. No conversational dyspnea.  Cardiovascular system:  S1 & S2 heard, RRR. No murmurs. No pedal edema. Gastrointestinal system: Abdomen is nondistended, soft and nontender. Normal bowel sounds heard. Central nervous system: Alert and oriented. No focal neurological deficits. Speech clear. Moves all extremities with equal strength, +Right lower facial numbness  Extremities: Symmetric in appearance  Skin: No rashes, lesions or ulcers on exposed skin  Psychiatry: Judgement and insight appear normal. Mood & affect appropriate.   Data Reviewed: I have personally reviewed following labs and imaging studies  CBC: Recent Labs  Lab 04/01/21 1529 04/02/21 0432   WBC 7.6 11.9*  NEUTROABS 6.6  --   HGB 16.1 15.6  HCT 45.9 43.4  MCV 100.9* 100.0  PLT 151 153   Basic Metabolic Panel: Recent Labs  Lab 04/01/21 1529 04/02/21 0432  NA 137 138  K 4.1 3.6  CL 103 106  CO2 22 23  GLUCOSE 143* 113*  BUN 21 22  CREATININE 0.95 0.97  CALCIUM 9.5 9.2   GFR: Estimated Creatinine Clearance: 71 mL/min (by C-G formula based on SCr of 0.97 mg/dL). Liver Function Tests: Recent Labs  Lab 04/01/21 1529 04/02/21 0432  AST 33 23  ALT 42 32  ALKPHOS 57 44  BILITOT 1.2 1.1  PROT 7.1 6.2*  ALBUMIN 4.5 3.6   No results for input(s): LIPASE, AMYLASE in the last 168 hours. No results for input(s): AMMONIA in the last 168 hours. Coagulation Profile: Recent Labs  Lab 04/01/21 1529  INR 1.1   Cardiac Enzymes: No results for input(s): CKTOTAL, CKMB, CKMBINDEX, TROPONINI in the last 168 hours. BNP (last 3 results) No results for input(s): PROBNP in the last 8760 hours. HbA1C: Recent Labs    04/02/21 0432  HGBA1C 5.6   CBG: Recent Labs  Lab 04/01/21 1517  GLUCAP 125*   Lipid Profile: Recent Labs    04/02/21 0432  CHOL 186  HDL 53  LDLCALC 110*  TRIG 115  CHOLHDL 3.5   Thyroid Function Tests: No results for input(s): TSH, T4TOTAL, FREET4, T3FREE, THYROIDAB in the last 72 hours. Anemia Panel: No results for input(s): VITAMINB12, FOLATE, FERRITIN, TIBC, IRON, RETICCTPCT in the last 72 hours. Sepsis Labs: No results for input(s): PROCALCITON, LATICACIDVEN in the last 168 hours.  Recent Results (from the past 240 hour(s))  SARS CORONAVIRUS 2 (TAT 6-24 HRS) Nasopharyngeal Nasopharyngeal Swab     Status: None   Collection Time: 04/01/21  4:55 PM   Specimen: Nasopharyngeal Swab  Result Value Ref Range Status   SARS Coronavirus 2 NEGATIVE NEGATIVE Final    Comment: (NOTE) SARS-CoV-2 target nucleic acids are NOT DETECTED.  The SARS-CoV-2 RNA is generally detectable in upper and lower respiratory specimens during the acute phase of  infection. Negative results do not preclude SARS-CoV-2 infection, do not rule out co-infections with other pathogens, and should not be used as the sole basis for treatment or other patient management decisions. Negative results must be combined with clinical observations, patient history, and epidemiological information. The expected result is Negative.  Fact Sheet for Patients: HairSlick.no  Fact Sheet for Healthcare Providers: quierodirigir.com  This test is not yet approved or cleared by the [place] FDA and  has been authorized for detection and/or diagnosis of SARS-CoV-2 by FDA under an Emergency Use Authorization (EUA). This EUA will remain  in effect (meaning this test can be used) for the duration of the COVID-19 declaration under Se ction 564(b)(1) of the Act, 21 U.S.C. section 360bbb-3(b)(1), unless the authorization is terminated or revoked sooner.  Performed at  [facility], [address] [place], [place] [address]       Radiology Studies: CT Angio Head W or Wo Contrast  Result Date: 04/01/2021 CLINICAL DATA:  Follow-up examination for acute stroke. EXAM: CT ANGIOGRAPHY HEAD AND NECK TECHNIQUE: Multidetector CT imaging of the head and neck was performed using the standard protocol during bolus administration of intravenous contrast. Multiplanar CT image reconstructions and MIPs were obtained to evaluate the vascular anatomy. Carotid stenosis measurements (when applicable) are obtained utilizing NASCET criteria, using the distal internal carotid diameter as the denominator. CONTRAST:  75mL OMNIPAQUE IOHEXOL 350 MG/ML SOLN COMPARISON:  Prior CT and MRI from earlier the same day. FINDINGS: CTA NECK FINDINGS Aortic arch: Visualized aortic arch normal in caliber with normal branch pattern. Mild atheromatous change about the arch and origin of the great vessels without hemodynamically significant stenosis. Right  carotid system: Right CCA patent from its origin to the bifurcation without stenosis. Eccentric calcified plaque about the right carotid bulb/proximal right ICA without significant stenosis. Right ICA patent distally without stenosis, dissection or occlusion. Left carotid system: Left CCA patent from its origin to the bifurcation without stenosis. Scattered calcified plaque about the left carotid bulb/proximal left ICA without significant stenosis. Left ICA patent distally without stenosis, dissection or occlusion. Vertebral arteries: Both vertebral arteries arise from the subclavian arteries. No proximal subclavian artery stenosis. Vertebral arteries patent within the neck without stenosis, dissection or occlusion. Skeleton: No visible acute osseous finding. No discrete or worrisome osseous lesions. Patient is largely edentulous. Other neck: No other acute soft tissue abnormality within the neck. No mass or adenopathy. Upper chest: Visualized upper chest demonstrates no acute finding. Review of the MIP images confirms the above findings CTA HEAD FINDINGS Anterior circulation: Petrous segments widely patent. Mild for age atheromatous change within the carotid siphons without stenosis. A1 segments widely patent. Normal anterior communicating artery complex. Anterior cerebral arteries widely patent to their distal aspects. No M1 stenosis or occlusion. Normal MCA bifurcations. Distal MCA branches well perfused and symmetric. Posterior circulation: Both V4 segments widely patent to the vertebrobasilar junction without stenosis. Both PICA origins patent and normal. Basilar widely patent to its distal aspect without stenosis. Superior cerebellar arteries patent bilaterally. Both PCAs primarily supplied via the basilar. Right PCA widely patent to its distal aspect. There is abrupt occlusion of the left PCA at the left P1/P2 junction (series 5, image 265). Venous sinuses: Grossly patent allowing for timing the contrast  bolus. Anatomic variants: None significant.  No aneurysm. Review of the MIP images confirms the above findings IMPRESSION: 1. 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 2. Mild atheromatous disease elsewhere about the major arterial vasculature of the head and neck as above. No other hemodynamically significant or correctable stenosis. Electronically Signed   By: Rise Mu M.D.   On: 04/01/2021 22:48   CT HEAD WO CONTRAST  Result Date: 04/01/2021 CLINICAL DATA:  Mental status change. EXAM: CT HEAD WITHOUT CONTRAST TECHNIQUE: Contiguous axial images were obtained from the base of the skull through the vertex without intravenous contrast. COMPARISON:  MRI of the brain May 10, 2020. FINDINGS: Brain: No evidence of acute infarction, hemorrhage, hydrocephalus, extra-axial collection or mass lesion/mass effect. Area of encephalomalacia and gliosis in the left frontal lobe and insula corresponding to infarct seen on prior MRI. Vascular: No hyperdense vessel. Calcified plaques in the bilateral carotid siphons. Skull: Normal. Negative for fracture or focal lesion. Sinuses/Orbits: No acute finding. Other: None. IMPRESSION:  1. No acute intracranial findings. 2. Area of encephalomalacia and gliosis in the left frontal lobe and insula corresponding to infarct seen on prior MRI. Electronically Signed   By: Baldemar Lenis M.D.   On: 04/01/2021 16:00   CT Angio Neck W and/or Wo Contrast  Result Date: 04/01/2021 CLINICAL DATA:  Follow-up examination for acute stroke. EXAM: CT ANGIOGRAPHY HEAD AND NECK TECHNIQUE: Multidetector CT imaging of the head and neck was performed using the standard protocol during bolus administration of intravenous contrast. Multiplanar CT image reconstructions and MIPs were obtained to evaluate the vascular anatomy. Carotid stenosis measurements (when applicable) are obtained utilizing NASCET criteria, using the distal internal carotid diameter as the  denominator. CONTRAST:  19mL OMNIPAQUE IOHEXOL 350 MG/ML SOLN COMPARISON:  Prior CT and MRI from earlier the same day. FINDINGS: CTA NECK FINDINGS Aortic arch: Visualized aortic arch normal in caliber with normal branch pattern. Mild atheromatous change about the arch and origin of the great vessels without hemodynamically significant stenosis. Right carotid system: Right CCA patent from its origin to the bifurcation without stenosis. Eccentric calcified plaque about the right carotid bulb/proximal right ICA without significant stenosis. Right ICA patent distally without stenosis, dissection or occlusion. Left carotid system: Left CCA patent from its origin to the bifurcation without stenosis. Scattered calcified plaque about the left carotid bulb/proximal left ICA without significant stenosis. Left ICA patent distally without stenosis, dissection or occlusion. Vertebral arteries: Both vertebral arteries arise from the subclavian arteries. No proximal subclavian artery stenosis. Vertebral arteries patent within the neck without stenosis, dissection or occlusion. Skeleton: No visible acute osseous finding. No discrete or worrisome osseous lesions. Patient is largely edentulous. Other neck: No other acute soft tissue abnormality within the neck. No mass or adenopathy. Upper chest: Visualized upper chest demonstrates no acute finding. Review of the MIP images confirms the above findings CTA HEAD FINDINGS Anterior circulation: Petrous segments widely patent. Mild for age atheromatous change within the carotid siphons without stenosis. A1 segments widely patent. Normal anterior communicating artery complex. Anterior cerebral arteries widely patent to their distal aspects. No M1 stenosis or occlusion. Normal MCA bifurcations. Distal MCA branches well perfused and symmetric. Posterior circulation: Both V4 segments widely patent to the vertebrobasilar junction without stenosis. Both PICA origins patent and normal. Basilar  widely patent to its distal aspect without stenosis. Superior cerebellar arteries patent bilaterally. Both PCAs primarily supplied via the basilar. Right PCA widely patent to its distal aspect. There is abrupt occlusion of the left PCA at the left P1/P2 junction (series 5, image 265). Venous sinuses: Grossly patent allowing for timing the contrast bolus. Anatomic variants: None significant.  No aneurysm. Review of the MIP images confirms the above findings IMPRESSION: 1. 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 2. Mild atheromatous disease elsewhere about the major arterial vasculature of the head and neck as above. No other hemodynamically significant or correctable stenosis. Electronically Signed   By: Rise Mu M.D.   On: 04/01/2021 22:48   MR BRAIN WO CONTRAST  Result Date: 04/01/2021 CLINICAL DATA:  Acute neuro deficit. Worsening right-sided weakness. EXAM: MRI HEAD WITHOUT CONTRAST TECHNIQUE: Multiplanar, multiecho pulse sequences of the brain and surrounding structures were obtained without intravenous contrast. COMPARISON:  CT head 04/01/2021.  MRI head 03/10/2020 FINDINGS: Brain: Acute infarct in the left hippocampus and left thalamus involving the left PCA territory. Small area of restricted diffusion in the high right frontal cortex measuring under 1 cm which may be subacute  to acute infarct. Chronic infarct involving the left operculum and frontal lobe on the left. Few small white matter hyperintensities. Negative for hemorrhage, mass, hydrocephalus Vascular: Negative for hyperdense vessel Skull and upper cervical spine: Negative Sinuses/Orbits: Mild mucosal edema paranasal sinuses. Right-sided cataract extraction Other: None IMPRESSION: Acute infarct left PCA territory involving the left hippocampus and left thalamus Probable small acute infarct in the high right parietal lobe. This suggests emboli. Chronic infarct left frontal lobe Electronically Signed   By: Marlan Palau M.D.   On: 04/01/2021 19:36      Scheduled Meds: . amiodarone  200 mg Oral Daily  . apixaban  5 mg Oral BID  . aspirin EC  81 mg Oral Daily  . atorvastatin  40 mg Oral Daily  . pantoprazole  40 mg Oral Daily  . sodium chloride flush  3 mL Intravenous Once  . valACYclovir  500 mg Oral QPM   Continuous Infusions:   LOS: 1 day      Time spent: 30 minutes   Noralee Stain, DO [facility] 04/02/2021, 10:13 AM   Available via Epic secure chat 7am-7pm After these hours, please refer to coverage provider listed on [email or web address]

## 2021-04-02 NOTE — Consult Note (Signed)
Referring Physician: Jarvis Newcomer    Chief Complaint: New onset of right sided weakness  HPI: Terry [name] is an 70 y.o. 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 He initially had noticed a small headache in the morning, then began having difficulty walking with drifting to the right.   He was sitting on his front porch awaiting contractors to arrive at his house when he developed a rapid change of sensation on the right side of his body with associated weakness, paresthesias and numbness. He also acutely noticed a stumbling gait in the same timeframe. He took a nap, but the symptoms were still present on awakening, so he decided to present to the ED via POV.   EDP exam revealed dysmetria, RUE weakness, pronator drift and gait disturbance, with an NIHSS score of 3. His presentation was not consistent with LVO.   At baseline, the patient ambulates independently without an assistive device. He had no significant residual neurological deficits from his prior stroke after completing PT.   CT head in the ED revealed no acute intracranial findings. Noted was an area of encephalomalacia and gliosis in the left frontal lobe and insula corresponding to infarct seen on prior MRI.  EKG:  Sinus rhythm with occasional Premature ventricular complexes Minimal voltage criteria for LVH, may be normal variant ( R in aVL ) Borderline ECG  CTA of head and neck: 1. 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 2. Mild atheromatous disease elsewhere about the major arterial vasculature of the head and neck as above. No other hemodynamically significant or correctable stenosis.  MRI brain: - Acute infarct left PCA  territory involving the left hippocampus and left thalamus - Probable small acute infarct in the high right parietal lobe. This suggests emboli. - Chronic infarct left frontal lobe  TTE completed with results pending.   LSN: 10:30 AM TOSO: 10:30 AM tPA Given: No: On Eliquis.    Past Medical History:  Diagnosis Date  . Diverticulosis   . GERD (gastroesophageal reflux disease)   . Heart murmur   . Hypercholesteremia   . Hypertension   . Mitral valve prolapse   Atrial fibrillation  Past Surgical History:  Procedure Laterality Date  . COLONOSCOPY WITH PROPOFOL N/A 03/06/2020   Procedure: COLONOSCOPY WITH PROPOFOL;  Surgeon: Toledo, Boykin Nearing, MD;  Location: [facility] ENDOSCOPY;  Service: Gastroenterology;  Laterality: N/A;  . MITRAL VALVE REPLACEMENT    . RETINAL DETACHMENT SURGERY    . RIGHT/LEFT HEART CATH AND CORONARY ANGIOGRAPHY Bilateral 02/18/2017   Procedure: Right/Left Heart Cath and Coronary Angiography;  Surgeon: Marcina Millard, MD;  Location: [facility] INVASIVE CV LAB;  Service: Cardiovascular;  Laterality: Bilateral;  . RIGHT/LEFT HEART CATH AND CORONARY ANGIOGRAPHY N/A 02/22/2020   Procedure: RIGHT/LEFT HEART CATH AND CORONARY ANGIOGRAPHY;  Surgeon: Marcina Millard, MD;  Location: [facility] INVASIVE CV LAB;  Service: Cardiovascular;  Laterality: N/A;  . TEMPOROMANDIBULAR JOINT SURGERY      Family History  Problem Relation Age of Onset  . Varicose Veins Mother   . Cancer Father    Social History:  reports that he quit smoking about 37 years ago. His smoking use included cigarettes. He has a 30.00 pack-year smoking history. He  has never used smokeless tobacco. He reports current alcohol use. He reports that he does not use drugs.  Allergies:  Allergies  Allergen Reactions  . Penicillins Shortness Of Breath    Did it involve swelling of the face/tongue/throat, SOB, or low BP? Yes Did it involve sudden or severe rash/hives, skin peeling, or any reaction on the inside of  your mouth or nose? Unknown Did you need to seek medical attention at a hospital or doctor's office? Was at a clinic when reaction occurred When did it last happen?More than 50 years ago If all above answers are "NO", may proceed with cephalosporin use.   . Ciprofloxacin Swelling and Rash  . Ivp Dye [Iodinated Diagnostic Agents] Rash    Medications:  Prior to Admission:  Medications Prior to Admission  Medication Sig Dispense Refill Last Dose  . apixaban (ELIQUIS) 5 MG TABS tablet Take 1 tablet (5 mg total) by mouth 2 (two) times daily. 60 tablet 0 04/01/2021 at Unknown time  . aspirin EC 81 MG EC tablet Take 1 tablet (81 mg total) by mouth daily. 30 tablet 0 Past Week at Unknown time  . atorvastatin (LIPITOR) 40 MG tablet Take 1 tablet (40 mg total) by mouth daily. 30 tablet 0 Past Week at Unknown time  . cholecalciferol (VITAMIN D3) 25 MCG (1000 UNIT) tablet Take 1,000 Units by mouth daily.   04/01/2021 at Unknown time  . clobetasol ointment (TEMOVATE) 0.05 % Apply 1 application topically daily as needed (skin irritation).    prn at prn  . Multiple Vitamin (MULTIVITAMIN WITH MINERALS) TABS tablet Take 1 tablet by mouth at bedtime.   Past Week at Unknown time  . omeprazole (PRILOSEC) 20 MG capsule Take 20 mg by mouth every evening.    Past Week at Unknown time  . triamcinolone cream (KENALOG) 0.1 % Apply 1 application topically daily as needed (skin irritation.).    prn at prn  . vitamin C (ASCORBIC ACID) 250 MG tablet Take 250 mg by mouth daily.   04/01/2021 at Unknown time   Scheduled: . amiodarone  200 mg Oral Daily  . apixaban  5 mg Oral BID  . aspirin EC  81 mg Oral Daily  . atorvastatin  40 mg Oral Daily  . pantoprazole  40 mg Oral Daily  . sodium chloride flush  3 mL Intravenous Once  . valACYclovir  500 mg Oral QPM    ROS: No recent illness or fever. No headache, recent trauma, falls or syncope.    Physical Examination: Blood pressure 122/86, pulse 68, temperature (!) 97.5 F  (36.4 C), temperature source Oral, resp. rate 16, height 5\' 7"  (1.702 m), weight 80.7 kg, SpO2 98 %.  HEENT: [place]/AT Lungs: Respirations unlabored  Ext: No edema  Neurologic Examination: Mental Status: Awake, alert and fully oriented. Speech fluent without evidence of aphasia.  Able to follow all commands without difficulty. No dysarthria.  Cranial Nerves: II:  Visual fields intact in all 4 quadrants of each eye. No extinction to DSS. Fixates and tracks normally.  III,IV, VI: No ptosis. EOMI. No nystagmus.  V,VII: Smile symmetric, facial temp sensation equal bilaterally VIII: hearing intact to voice IX,X: No hypophonia or hoarseness XI: Symmetric XII: Midline tongue extension  Motor: Right : Upper extremity   5/5    Left:     Upper extremity   5/5  Lower extremity   5/5     Lower extremity   5/5 No pronator drift Sensory: Temp and light touch intact  in all 4 extremities proximally. No extinction. 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  Deep Tendon Reflexes:  2+ bilateral brachioradialis, biceps and patellae.  Cerebellar: Left FNF normal. Subtle dysmetria with right FNF.  Gait: Deferred   Results for orders placed or performed during the hospital encounter of 04/01/21 (from the past 48 hour(s))  CBG monitoring, ED     Status: Abnormal   Collection Time: 04/01/21  3:17 PM  Result Value Ref Range   Glucose-Capillary 125 (H) 70 - 99 mg/dL    Comment: Glucose reference range applies only to samples taken after fasting for at least 8 hours.  Protime-INR     Status: None   Collection Time: 04/01/21  3:29 PM  Result Value Ref Range   Prothrombin Time 13.7 11.4 - 15.2 seconds   INR 1.1 0.8 - 1.2    Comment: (NOTE) INR goal varies based on device and disease states. Performed at [facility], [address] [place], [place] [address]   APTT     Status: None   Collection Time: 04/01/21  3:29 PM  Result Value Ref Range   aPTT 28 24 - 36 seconds    Comment: Performed  at [facility], [address] [place], [place] [address]  CBC     Status: Abnormal   Collection Time: 04/01/21  3:29 PM  Result Value Ref Range   WBC 7.6 4.0 - 10.5 K/uL   RBC 4.55 4.22 - 5.81 MIL/uL   Hemoglobin 16.1 13.0 - 17.0 g/dL   HCT 09.8 11.9 - 14.7 %   MCV 100.9 (H) 80.0 - 100.0 fL   MCH 35.4 (H) 26.0 - 34.0 pg   MCHC 35.1 30.0 - 36.0 g/dL   RDW 82.9 56.2 - 13.0 %   Platelets 151 150 - 400 K/uL   nRBC 0.0 0.0 - 0.2 %    Comment: Performed at [facility], [address] [place], [place] [address]  Differential     Status: Abnormal   Collection Time: 04/01/21  3:29 PM  Result Value Ref Range   Neutrophils Relative % 86 %   Neutro Abs 6.6 1.7 - 7.7 K/uL   Lymphocytes Relative 8 %   Lymphs Abs 0.6 (L) 0.7 - 4.0 K/uL   Monocytes Relative 5 %   Monocytes Absolute 0.3 0.1 - 1.0 K/uL   Eosinophils Relative 0 %   Eosinophils Absolute 0.0 0.0 - 0.5 K/uL   Basophils Relative 0 %   Basophils Absolute 0.0 0.0 - 0.1 K/uL   Immature Granulocytes 1 %   Abs Immature Granulocytes 0.11 (H) 0.00 - 0.07 K/uL    Comment: Performed at [facility], [address] [place], [place] [address]  Comprehensive metabolic panel     Status: Abnormal   Collection Time: 04/01/21  3:29 PM  Result Value Ref Range   Sodium 137 135 - 145 mmol/L   Potassium 4.1 3.5 - 5.1 mmol/L   Chloride 103 98 - 111 mmol/L   CO2 22 22 - 32 mmol/L   Glucose, Bld 143 (H) 70 - 99 mg/dL    Comment: Glucose reference range applies only to samples taken after fasting for at least 8 hours.   BUN 21 8 - 23 mg/dL   Creatinine, Ser 9.52 0.61 - 1.24 mg/dL   Calcium 9.5 8.9 - 84.1 mg/dL   Total Protein 7.1 6.5 - 8.1 g/dL   Albumin 4.5 3.5 - 5.0 g/dL   AST 33 15 - 41  U/L   ALT 42 0 - 44 U/L   Alkaline Phosphatase 57 38 - 126 U/L   Total Bilirubin 1.2 0.3 - 1.2 mg/dL   GFR, Estimated >60 >10 mL/mi>16n    Comment: (NOTE) Calculated using the CKD-EPI Creatinine Equation (2021)    Anion gap  12 5 - 15    Comment: Performed at [facility], [address] [place], [place] [address]  SARS CORONAVIRUS 2 (TAT 6-24 HRS) Nasopharyngeal Nasopharyngeal Swab     Status: None   Collection Time: 04/01/21  4:55 PM   Specimen: Nasopharyngeal Swab  Result Value Ref Range   SARS Coronavirus 2 NEGATIVE NEGATIVE    Comment: (NOTE) SARS-CoV-2 target nucleic acids are NOT DETECTED.  The SARS-CoV-2 RNA is generally detectable in upper and lower respiratory specimens during the acute phase of infection. Negative results do not preclude SARS-CoV-2 infection, do not rule out co-infections with other pathogens, and should not be used as the sole basis for treatment or other patient management decisions. Negative results must be combined with clinical observations, patient history, and epidemiological information. The expected result is Negative.  Fact Sheet for Patients: HairSlick.no  Fact Sheet for Healthcare Providers: quierodirigir.com  This test is not yet approved or cleared by the [place] FDA and  has been authorized for detection and/or diagnosis of SARS-CoV-2 by FDA under an Emergency Use Authorization (EUA). This EUA will remain  in effect (meaning this test can be used) for the duration of the COVID-19 declaration under Se ction 564(b)(1) of the Act, 21 U.S.C. section 360bbb-3(b)(1), unless the authorization is terminated or revoked sooner.  Performed at [facility], [address], [place], [place] [address]   Hemoglobin A1c     Status: None   Collection Time: 04/02/21  4:32 AM  Result Value Ref Range   Hgb A1c MFr Bld 5.6 4.8 - 5.6 %    Comment: (NOTE) Pre diabetes:          5.7%-6.4%  Diabetes:              >6.4%  Glycemic control for   <7.0% adults with diabetes    Mean Plasma Glucose 114.02 mg/dL    Comment: Performed at [facility], [address] [place], [place] [address]   Lipid panel     Status: Abnormal   Collection Time: 04/02/21  4:32 AM  Result Value Ref Range   Cholesterol 186 0 - 200 mg/dL   Triglycerides 829 <562 mg/dL   HDL 53 >13 mg/dL   Total CHOL/HDL Ratio 3.5 RATIO   VLDL 23 0 - 40 mg/dL   LDL Cholesterol 086 (H) 0 - 99 mg/dL    Comment:        Total Cholesterol/HDL:CHD Risk Coronary Heart Disease Risk Table                     Men   Women  1/2 Average Risk   3.4   3.3  Average Risk       5.0   4.4  2 X Average Risk   9.6   7.1  3 X Average Risk  23.4   11.0        Use the calculated Patient Ratio above and the CHD Risk Table to determine the patient's CHD Risk.        ATP III CLASSIFICATION (LDL):  <100     mg/dL   Optimal  578-469  mg/dL  Near or Above                    Optimal  130-159  mg/dL   Borderline  497-026  mg/dL   High  >378     mg/dL   Very High Performed at [facility], [address] [place], [place] [address]   CBC     Status: Abnormal   Collection Time: 04/02/21  4:32 AM  Result Value Ref Range   WBC 11.9 (H) 4.0 - 10.5 K/uL   RBC 4.34 4.22 - 5.81 MIL/uL   Hemoglobin 15.6 13.0 - 17.0 g/dL   HCT 27.7 41.2 - 87.8 %   MCV 100.0 80.0 - 100.0 fL   MCH 35.9 (H) 26.0 - 34.0 pg   MCHC 35.9 30.0 - 36.0 g/dL   RDW 67.6 72.0 - 94.7 %   Platelets 153 150 - 400 K/uL   nRBC 0.0 0.0 - 0.2 %    Comment: Performed at [facility], [address] [place], [place] [address]  Comprehensive metabolic panel     Status: Abnormal   Collection Time: 04/02/21  4:32 AM  Result Value Ref Range   Sodium 138 135 - 145 mmol/L   Potassium 3.6 3.5 - 5.1 mmol/L   Chloride 106 98 - 111 mmol/L   CO2 23 22 - 32 mmol/L   Glucose, Bld 113 (H) 70 - 99 mg/dL    Comment: Glucose reference range applies only to samples taken after fasting for at least 8 hours.   BUN 22 8 - 23 mg/dL   Creatinine, Ser 3.66 0.61 - 1.24 mg/dL   Calcium 9.2 8.9 - 29.4 mg/dL   Total Protein 6.2 (L) 6.5 - 8.1 g/dL   Albumin 3.6 3.5 - 5.0  g/dL   AST 23 15 - 41 U/L   ALT 32 0 - 44 U/L   Alkaline Phosphatase 44 38 - 126 U/L   Total Bilirubin 1.1 0.3 - 1.2 mg/dL   GFR, Estimated >76 >54 mL/min    Comment: (NOTE) Calculated using the CKD-EPI Creatinine Equation (2021)    Anion gap 9 5 - 15    Comment: Performed at [facility], [address] [place], [place] [address]   CT Angio Head W or Wo Contrast  Result Date: 04/01/2021 CLINICAL DATA:  Follow-up examination for acute stroke. EXAM: CT ANGIOGRAPHY HEAD AND NECK TECHNIQUE: Multidetector CT imaging of the head and neck was performed using the standard protocol during bolus administration of intravenous contrast. Multiplanar CT image reconstructions and MIPs were obtained to evaluate the vascular anatomy. Carotid stenosis measurements (when applicable) are obtained utilizing NASCET criteria, using the distal internal carotid diameter as the denominator. CONTRAST:  64mL OMNIPAQUE IOHEXOL 350 MG/ML SOLN COMPARISON:  Prior CT and MRI from earlier the same day. FINDINGS: CTA NECK FINDINGS Aortic arch: Visualized aortic arch normal in caliber with normal branch pattern. Mild atheromatous change about the arch and origin of the great vessels without hemodynamically significant stenosis. Right carotid system: Right CCA patent from its origin to the bifurcation without stenosis. Eccentric calcified plaque about the right carotid bulb/proximal right ICA without significant stenosis. Right ICA patent distally without stenosis, dissection or occlusion. Left carotid system: Left CCA patent from its origin to the bifurcation without stenosis. Scattered calcified plaque about the left carotid bulb/proximal left ICA without significant stenosis. Left ICA patent distally without stenosis, dissection or occlusion. Vertebral arteries: Both vertebral arteries arise from the subclavian arteries. No  proximal subclavian artery stenosis. Vertebral arteries patent within the neck without stenosis,  dissection or occlusion. Skeleton: No visible acute osseous finding. No discrete or worrisome osseous lesions. Patient is largely edentulous. Other neck: No other acute soft tissue abnormality within the neck. No mass or adenopathy. Upper chest: Visualized upper chest demonstrates no acute finding. Review of the MIP images confirms the above findings CTA HEAD FINDINGS Anterior circulation: Petrous segments widely patent. Mild for age atheromatous change within the carotid siphons without stenosis. A1 segments widely patent. Normal anterior communicating artery complex. Anterior cerebral arteries widely patent to their distal aspects. No M1 stenosis or occlusion. Normal MCA bifurcations. Distal MCA branches well perfused and symmetric. Posterior circulation: Both V4 segments widely patent to the vertebrobasilar junction without stenosis. Both PICA origins patent and normal. Basilar widely patent to its distal aspect without stenosis. Superior cerebellar arteries patent bilaterally. Both PCAs primarily supplied via the basilar. Right PCA widely patent to its distal aspect. There is abrupt occlusion of the left PCA at the left P1/P2 junction (series 5, image 265). Venous sinuses: Grossly patent allowing for timing the contrast bolus. Anatomic variants: None significant.  No aneurysm. Review of the MIP images confirms the above findings IMPRESSION: 1. 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 2. Mild atheromatous disease elsewhere about the major arterial vasculature of the head and neck as above. No other hemodynamically significant or correctable stenosis. Electronically Signed   By: Rise Mu M.D.   On: 04/01/2021 22:48   CT HEAD WO CONTRAST  Result Date: 04/01/2021 CLINICAL DATA:  Mental status change. EXAM: CT HEAD WITHOUT CONTRAST TECHNIQUE: Contiguous axial images were obtained from the base of the skull through the vertex without intravenous contrast. COMPARISON:  MRI of the brain  May 10, 2020. FINDINGS: Brain: No evidence of acute infarction, hemorrhage, hydrocephalus, extra-axial collection or mass lesion/mass effect. Area of encephalomalacia and gliosis in the left frontal lobe and insula corresponding to infarct seen on prior MRI. Vascular: No hyperdense vessel. Calcified plaques in the bilateral carotid siphons. Skull: Normal. Negative for fracture or focal lesion. Sinuses/Orbits: No acute finding. Other: None. IMPRESSION: 1. No acute intracranial findings. 2. Area of encephalomalacia and gliosis in the left frontal lobe and insula corresponding to infarct seen on prior MRI. Electronically Signed   By: Baldemar Lenis M.D.   On: 04/01/2021 16:00   CT Angio Neck W and/or Wo Contrast  Result Date: 04/01/2021 CLINICAL DATA:  Follow-up examination for acute stroke. EXAM: CT ANGIOGRAPHY HEAD AND NECK TECHNIQUE: Multidetector CT imaging of the head and neck was performed using the standard protocol during bolus administration of intravenous contrast. Multiplanar CT image reconstructions and MIPs were obtained to evaluate the vascular anatomy. Carotid stenosis measurements (when applicable) are obtained utilizing NASCET criteria, using the distal internal carotid diameter as the denominator. CONTRAST:  75mL OMNIPAQUE IOHEXOL 350 MG/ML SOLN COMPARISON:  Prior CT and MRI from earlier the same day. FINDINGS: CTA NECK FINDINGS Aortic arch: Visualized aortic arch normal in caliber with normal branch pattern. Mild atheromatous change about the arch and origin of the great vessels without hemodynamically significant stenosis. Right carotid system: Right CCA patent from its origin to the bifurcation without stenosis. Eccentric calcified plaque about the right carotid bulb/proximal right ICA without significant stenosis. Right ICA patent distally without stenosis, dissection or occlusion. Left carotid system: Left CCA patent from its origin to the bifurcation without stenosis. Scattered  calcified plaque about the left carotid bulb/proximal  left ICA without significant stenosis. Left ICA patent distally without stenosis, dissection or occlusion. Vertebral arteries: Both vertebral arteries arise from the subclavian arteries. No proximal subclavian artery stenosis. Vertebral arteries patent within the neck without stenosis, dissection or occlusion. Skeleton: No visible acute osseous finding. No discrete or worrisome osseous lesions. Patient is largely edentulous. Other neck: No other acute soft tissue abnormality within the neck. No mass or adenopathy. Upper chest: Visualized upper chest demonstrates no acute finding. Review of the MIP images confirms the above findings CTA HEAD FINDINGS Anterior circulation: Petrous segments widely patent. Mild for age atheromatous change within the carotid siphons without stenosis. A1 segments widely patent. Normal anterior communicating artery complex. Anterior cerebral arteries widely patent to their distal aspects. No M1 stenosis or occlusion. Normal MCA bifurcations. Distal MCA branches well perfused and symmetric. Posterior circulation: Both V4 segments widely patent to the vertebrobasilar junction without stenosis. Both PICA origins patent and normal. Basilar widely patent to its distal aspect without stenosis. Superior cerebellar arteries patent bilaterally. Both PCAs primarily supplied via the basilar. Right PCA widely patent to its distal aspect. There is abrupt occlusion of the left PCA at the left P1/P2 junction (series 5, image 265). Venous sinuses: Grossly patent allowing for timing the contrast bolus. Anatomic variants: None significant.  No aneurysm. Review of the MIP images confirms the above findings IMPRESSION: 1. 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 2. Mild atheromatous disease elsewhere about the major arterial vasculature of the head and neck as above. No other hemodynamically significant or correctable stenosis.  Electronically Signed   By: Rise MuBenjamin  McClintock M.D.   On: 04/01/2021 22:48   MR BRAIN WO CONTRAST  Result Date: 04/01/2021 CLINICAL DATA:  Acute neuro deficit. Worsening right-sided weakness. EXAM: MRI HEAD WITHOUT CONTRAST TECHNIQUE: Multiplanar, multiecho pulse sequences of the brain and surrounding structures were obtained without intravenous contrast. COMPARISON:  CT head 04/01/2021.  MRI head 03/10/2020 FINDINGS: Brain: Acute infarct in the left hippocampus and left thalamus involving the left PCA territory. Small area of restricted diffusion in the high right frontal cortex measuring under 1 cm which may be subacute to acute infarct. Chronic infarct involving the left operculum and frontal lobe on the left. Few small white matter hyperintensities. Negative for hemorrhage, mass, hydrocephalus Vascular: Negative for hyperdense vessel Skull and upper cervical spine: Negative Sinuses/Orbits: Mild mucosal edema paranasal sinuses. Right-sided cataract extraction Other: None IMPRESSION: Acute infarct left PCA territory involving the left hippocampus and left thalamus Probable small acute infarct in the high right parietal lobe. This suggests emboli. Chronic infarct left frontal lobe Electronically Signed   By: Marlan Palauharles  Clark M.D.   On: 04/01/2021 19:36    Assessment: 70 y.o. 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 He was not a tPA candidate due to being on oral anticoagulation.  - CT head in the ED revealed no acute intracranial findings. Noted was an area of encephalomalacia and gliosis in the left frontal lobe and insula corresponding to infarct seen on prior MRI. - Symptoms and exam findings on arrival were not consistent with an LVO and patient was therefore not an  endovascular candidate on initial assessment.  - MRI was obtained, which showed a small left thalamic acute ischemic infarction and left hippocampal  acute infarction. Also noted was a probable small acute infarct in the high right parietal lobe, suggestive of emboli. Chronic infarct in the left frontal lobe was again seen. - CTA was then obtained, which revealed an acute occlusion of the left PCA at the left P1/P2 junction.  Due to completed infarcts on MRI and low NIHSS of 3, risks of proceeding to acute thrombectomy pathway, including 10% subarachnoid hemorrhage risk and possible intubation complications, significantly outweighed benefits.  - TTE reveals LVEF of 55-60% with no regional wall motion abnormalities, but with grade II diastolic dysfunction. No mural thrombus or valvular vegetation mentioned in the report. Evidence for prior mitral valve repair/replacement was noted.  - EKG: Sinus rhythm with occasional Premature ventricular complexes. Minimal voltage criteria for LVH, may be normal variant ( R in aVL ) - Exam eveals subtle right sided dysmetria. No sensory loss or motor weakness appreciated.   - Stroke Risk Factors - atrial fibrillation, prior stroke, HTN and hypercholesterolemia    Recommendations: 1. PT/OT/Speech 2. Continue apixaban and atorvastatin.  3. He is also on ASA for CAD 4. BP management. Out of the permissive HTN time window 5. Telemetry monitoring 6. Frequent neuro checks 7. Outpatient Neurology follow up at the [facility] clinic   @Electronically  signed: Dr.  04/02/2021, 10:13 AM

## 2021-04-03 MED ORDER — ATORVASTATIN CALCIUM 80 MG PO TABS: 80.0000 mg | ORAL_TABLET | Freq: Every day | ORAL | 11 refills | Status: DC

## 2021-04-03 NOTE — TOC Initial Note (Signed)
Transition of Care [facility]) - Initial/Assessment Note    Patient Details  Name: Terry [name] MRN: [id number] Date of Birth: 07-27-1951  Transition [facility]) CM/SW Contact:    Shelbie Hutching, RN Phone Number: 04/03/2021, 11:12 AM  Clinical Narrative:                 Patient admitted to the hospital with stroke, he is now medically cleared for discharge home today.  PT is recommending outpatient therapy.  RNCM met with patient at the bedside, patient if from home with his wife.  Patient is independent at home.  Patient agrees with OP therapy, RNCM will fax referral.  Patient is current with PCP Dr. Edwina Barth, pharmacy is [facility] [place].  Patient reports that his son will pick him up today.   Expected Discharge Plan: OP Rehab Barriers to Discharge: Barriers Resolved   Patient Goals and CMS Choice Patient states their goals for this hospitalization and ongoing recovery are:: Patient glad to be going home and agrees to OP therapy CMS Medicare.gov Compare Post Acute Care list provided to:: Patient Choice offered to / list presented to : Patient  Expected Discharge Plan and Services Expected Discharge Plan: OP Rehab   Discharge Planning Services: CM Consult   Living arrangements for the past 2 months: Single Family Home Expected Discharge Date: 04/03/21               DME Arranged: N/A DME Agency: NA       HH Arranged: NA HH Agency: NA        Prior Living Arrangements/Services Living arrangements for the past 2 months: Single Family Home Lives with:: Spouse Patient language and need for interpreter reviewed:: Yes Do you feel safe going back to the place where you live?: Yes      Need for Family Participation in Patient Care: Yes (Comment) (stroke) Care giver support system in place?: Yes (comment) (wife and son)   Criminal Activity/Legal Involvement Pertinent to Current Situation/Hospitalization: No - Comment as needed  Activities of Daily Living Home Assistive  Devices/Equipment: None ADL Screening (condition at time of admission) Patient's cognitive ability adequate to safely complete daily activities?: Yes Is the patient deaf or have difficulty hearing?: No Does the patient have difficulty seeing, even when wearing glasses/contacts?: No Does the patient have difficulty concentrating, remembering, or making decisions?: No Patient able to express need for assistance with ADLs?: Yes Does the patient have difficulty dressing or bathing?: No Independently performs ADLs?: Yes (appropriate for developmental age) Does the patient have difficulty walking or climbing stairs?: No Weakness of Legs: Right Weakness of Arms/Hands: None  Permission Sought/Granted Permission sought to share information with : Case Manager,Facility Sport and exercise psychologist Permission granted to share information with : Yes, Verbal Permission Granted     Permission granted to share info w AGENCY: [facility] OP Therapy        Emotional Assessment Appearance:: Appears stated age Attitude/Demeanor/Rapport: Engaged Affect (typically observed): Accepting Orientation: : Oriented to Self,Oriented to Place,Oriented to  Time,Oriented to Situation Alcohol / Substance Use: Not Applicable Psych Involvement: No (comment)  Admission diagnosis:  CVA (cerebral vascular accident) ([place]) [I63.9] Right sided weakness [R53.1] Cerebrovascular accident (CVA) due to other mechanism [facility]) [I63.89] Patient Active Problem List   Diagnosis Date Noted  . Chronic anticoagulation 05/11/2020  . Acute CVA (cerebrovascular accident) ([place]) 05/08/2020  . Hypercholesteremia   . Hypertension   . Stroke ([place])   . History of mitral valve replacement   .  Leg pain 09/27/2017  . 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 Venous insufficiency 09/27/2017  . GERD (gastroesophageal reflux disease) 09/27/2017   PCP:  Baxter Hire, MD Pharmacy:   [id number] [id number]: [phone]: [phone]    Social Determinants of Health (SDOH) Interventions    Readmission Risk Interventions No flowsheet data found.

## 2021-04-03 NOTE — Discharge Summary (Signed)
Physician Discharge Summary  Terry [name] AVW:[id number] DOB: February 20, 1951 DOA: 04/01/2021  PCP: Gracelyn Nurse, MD  Admit date: 04/01/2021 Discharge date: 04/03/2021  Admitted From: Home Disposition:  Home  Recommendations for Outpatient Follow-up:  1. Follow up with PCP in 1 week 2. Follow up with Neurology  Discharge Condition: Stable CODE STATUS: Full  Diet recommendation: Heart healthy   Brief/Interim Summary: Terry [name] a 70 y.o.malewith medical history significant ofprevious CVA in May 2021 involving the right side, hypertension, hyperlipidemia, history of mitral valve prolapse s/p mitral valve replacement, GERD who has been doing well with his rehab since the last stroke. He is on statin and baby aspirin. He has paroxysmal atrial fibrillation on Eliquis and he has had mitral valve replacement. He now presents with new onset of right-sided weakness.He was sitting down waiting for some contractors in the house when he developed a sensation of rapid change on the right side. Then the weakness and paresthesias started. He started stumbling. He was seen in the ER evaluated MRI confirmed acute CVA. He was not a tPA candidate due to being on oral anticoagulation. Neurology consulted. MRI showed a small left thalamic acute ischemic infarction and left hippocampal acute infarction. Also noted was a probable small acute infarct in the high right parietal lobe, suggestive of emboli. Chronic infarct in the left frontal lobe was again seen. CTA was then obtained, which revealed an acute occlusion of the left PCA at the left P1/P2 junction.  Due to completed infarcts on MRI and low NIHSS of 3, risks of proceeding to acute thrombectomy pathway, including 10% subarachnoid hemorrhage risk and possible intubation complications, significantly outweighed benefits. TTE reveals LVEF of 55-60% with no regional wall motion abnormalities, but with grade II diastolic dysfunction. No mural thrombus or  valvular vegetation mentioned in the report. Evidence for prior mitral valve repair/replacement was noted. He was recommended for outpatient Neurology follow up.   Discharge Diagnoses:  Principal Problem:   Acute CVA (cerebrovascular accident) (HCC) Active Problems:   GERD (gastroesophageal reflux disease)   Hypercholesteremia   Hypertension   History of mitral valve replacement   Chronic anticoagulation   Acute CVA -MRI brain: Acute infarct left PCA territory involving the left hippocampus and left thalamus. Probable small acute infarct in the high right parietal lobe. This suggests emboli. Chronic infarct left frontal lobe -Has been on Eliquis, baby aspirin, Lipitor -Neurology consulted -PT OT SLP, recommended for outpatient PT   Paroxysmal atrial fibrillation -Normal sinus rhythm this morning -Continue Eliquis  GERD -PPI   Discharge Instructions  Discharge Instructions    Ambulatory referral to Physical Therapy   Complete by: As directed    Call MD for:  difficulty breathing, headache or visual disturbances   Complete by: As directed    Call MD for:  extreme fatigue   Complete by: As directed    Call MD for:  persistant dizziness or light-headedness   Complete by: As directed    Call MD for:  persistant nausea and vomiting   Complete by: As directed    Call MD for:  severe uncontrolled pain   Complete by: As directed    Call MD for:  temperature >100.4   Complete by: As directed    Discharge instructions   Complete by: As directed    You were cared for by a hospitalist during your hospital stay. If you have any questions about your discharge medications or the care you received while you were in the hospital  after you are discharged, you can call the unit and ask to speak with the hospitalist on call if the hospitalist that took care of you is not available. Once you are discharged, your primary care physician will handle any further medical issues. Please note that  NO REFILLS for any discharge medications will be authorized once you are discharged, as it is imperative that you return to your primary care physician (or establish a relationship with a primary care physician if you do not have one) for your aftercare needs so that they can reassess your need for medications and monitor your lab values.   Increase activity slowly   Complete by: As directed      Allergies as of 04/03/2021      Reactions   Penicillins Shortness Of Breath   Did it involve swelling of the face/tongue/throat, SOB, or low BP? Yes Did it involve sudden or severe rash/hives, skin peeling, or any reaction on the inside of your mouth or nose? Unknown Did you need to seek medical attention at a hospital or doctor's office? Was at a clinic when reaction occurred When did it last happen?More than 50 years ago If all above answers are "NO", may proceed with cephalosporin use.   Ciprofloxacin Swelling, Rash   Ivp Dye [iodinated Diagnostic Agents] Rash      Medication List    TAKE these medications   apixaban 5 MG Tabs tablet Commonly known as: ELIQUIS Take 1 tablet (5 mg total) by mouth 2 (two) times daily.   aspirin 81 MG EC tablet Take 1 tablet (81 mg total) by mouth daily.   atorvastatin 80 MG tablet Commonly known as: Lipitor Take 1 tablet (80 mg total) by mouth daily. What changed:   medication strength  how much to take   cholecalciferol 25 MCG (1000 UNIT) tablet Commonly known as: VITAMIN D3 Take 1,000 Units by mouth daily.   clobetasol ointment 0.05 % Commonly known as: TEMOVATE Apply 1 application topically daily as needed (skin irritation).   multivitamin with minerals Tabs tablet Take 1 tablet by mouth at bedtime.   omeprazole 20 MG capsule Commonly known as: PRILOSEC Take 20 mg by mouth every evening.   triamcinolone 0.1 % Commonly known as: KENALOG Apply 1 application topically daily as needed (skin irritation.).   vitamin C 250 MG  tablet Commonly known as: ASCORBIC ACID Take 250 mg by mouth daily.       Follow-up Information    Gracelyn Nurse, MD. Schedule an appointment as soon as possible for a visit in 1 week(s).   Specialty: Internal Medicine Contact information: [address] [place] [address] [phone]        Morene Crocker, MD. Schedule an appointment as soon as possible for a visit in 6 week(s).   Specialty: Neurology Contact information: [address] [place] [address] [phone]              Allergies  Allergen Reactions  . Penicillins Shortness Of Breath    Did it involve swelling of the face/tongue/throat, SOB, or low BP? Yes Did it involve sudden or severe rash/hives, skin peeling, or any reaction on the inside of your mouth or nose? Unknown Did you need to seek medical attention at a hospital or doctor's office? Was at a clinic when reaction occurred When did it last happen?More than 50 years ago If all above answers are "NO", may proceed with cephalosporin use.   . Ciprofloxacin Swelling and Rash  .  Ivp Dye [Iodinated Diagnostic Agents] Rash    Consultations:  Neurology    Procedures/Studies: CT Angio Head W or Wo Contrast  Result Date: 04/01/2021 CLINICAL DATA:  Follow-up examination for acute stroke. EXAM: CT ANGIOGRAPHY HEAD AND NECK TECHNIQUE: Multidetector CT imaging of the head and neck was performed using the standard protocol during bolus administration of intravenous contrast. Multiplanar CT image reconstructions and MIPs were obtained to evaluate the vascular anatomy. Carotid stenosis measurements (when applicable) are obtained utilizing NASCET criteria, using the distal internal carotid diameter as the denominator. CONTRAST:  75mL OMNIPAQUE IOHEXOL 350 MG/ML SOLN COMPARISON:  Prior CT and MRI from earlier the same day. FINDINGS: CTA NECK FINDINGS Aortic arch: Visualized aortic arch normal in caliber with normal branch pattern. Mild  atheromatous change about the arch and origin of the great vessels without hemodynamically significant stenosis. Right carotid system: Right CCA patent from its origin to the bifurcation without stenosis. Eccentric calcified plaque about the right carotid bulb/proximal right ICA without significant stenosis. Right ICA patent distally without stenosis, dissection or occlusion. Left carotid system: Left CCA patent from its origin to the bifurcation without stenosis. Scattered calcified plaque about the left carotid bulb/proximal left ICA without significant stenosis. Left ICA patent distally without stenosis, dissection or occlusion. Vertebral arteries: Both vertebral arteries arise from the subclavian arteries. No proximal subclavian artery stenosis. Vertebral arteries patent within the neck without stenosis, dissection or occlusion. Skeleton: No visible acute osseous finding. No discrete or worrisome osseous lesions. Patient is largely edentulous. Other neck: No other acute soft tissue abnormality within the neck. No mass or adenopathy. Upper chest: Visualized upper chest demonstrates no acute finding. Review of the MIP images confirms the above findings CTA HEAD FINDINGS Anterior circulation: Petrous segments widely patent. Mild for age atheromatous change within the carotid siphons without stenosis. A1 segments widely patent. Normal anterior communicating artery complex. Anterior cerebral arteries widely patent to their distal aspects. No M1 stenosis or occlusion. Normal MCA bifurcations. Distal MCA branches well perfused and symmetric. Posterior circulation: Both V4 segments widely patent to the vertebrobasilar junction without stenosis. Both PICA origins patent and normal. Basilar widely patent to its distal aspect without stenosis. Superior cerebellar arteries patent bilaterally. Both PCAs primarily supplied via the basilar. Right PCA widely patent to its distal aspect. There is abrupt occlusion of the left PCA  at the left P1/P2 junction (series 5, image 265). Venous sinuses: Grossly patent allowing for timing the contrast bolus. Anatomic variants: None significant.  No aneurysm. Review of the MIP images confirms the above findings IMPRESSION: 1. 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 2. Mild atheromatous disease elsewhere about the major arterial vasculature of the head and neck as above. No other hemodynamically significant or correctable stenosis. Electronically Signed   By: Rise Mu M.D.   On: 04/01/2021 22:48   CT HEAD WO CONTRAST  Result Date: 04/01/2021 CLINICAL DATA:  Mental status change. EXAM: CT HEAD WITHOUT CONTRAST TECHNIQUE: Contiguous axial images were obtained from the base of the skull through the vertex without intravenous contrast. COMPARISON:  MRI of the brain May 10, 2020. FINDINGS: Brain: No evidence of acute infarction, hemorrhage, hydrocephalus, extra-axial collection or mass lesion/mass effect. Area of encephalomalacia and gliosis in the left frontal lobe and insula corresponding to infarct seen on prior MRI. Vascular: No hyperdense vessel. Calcified plaques in the bilateral carotid siphons. Skull: Normal. Negative for fracture or focal lesion. Sinuses/Orbits: No acute finding. Other: None. IMPRESSION: 1. No acute  intracranial findings. 2. Area of encephalomalacia and gliosis in the left frontal lobe and insula corresponding to infarct seen on prior MRI. Electronically Signed   By: Baldemar Lenis M.D.   On: 04/01/2021 16:00   CT Angio Neck W and/or Wo Contrast  Result Date: 04/01/2021 CLINICAL DATA:  Follow-up examination for acute stroke. EXAM: CT ANGIOGRAPHY HEAD AND NECK TECHNIQUE: Multidetector CT imaging of the head and neck was performed using the standard protocol during bolus administration of intravenous contrast. Multiplanar CT image reconstructions and MIPs were obtained to evaluate the vascular anatomy. Carotid stenosis measurements  (when applicable) are obtained utilizing NASCET criteria, using the distal internal carotid diameter as the denominator. CONTRAST:  75mL OMNIPAQUE IOHEXOL 350 MG/ML SOLN COMPARISON:  Prior CT and MRI from earlier the same day. FINDINGS: CTA NECK FINDINGS Aortic arch: Visualized aortic arch normal in caliber with normal branch pattern. Mild atheromatous change about the arch and origin of the great vessels without hemodynamically significant stenosis. Right carotid system: Right CCA patent from its origin to the bifurcation without stenosis. Eccentric calcified plaque about the right carotid bulb/proximal right ICA without significant stenosis. Right ICA patent distally without stenosis, dissection or occlusion. Left carotid system: Left CCA patent from its origin to the bifurcation without stenosis. Scattered calcified plaque about the left carotid bulb/proximal left ICA without significant stenosis. Left ICA patent distally without stenosis, dissection or occlusion. Vertebral arteries: Both vertebral arteries arise from the subclavian arteries. No proximal subclavian artery stenosis. Vertebral arteries patent within the neck without stenosis, dissection or occlusion. Skeleton: No visible acute osseous finding. No discrete or worrisome osseous lesions. Patient is largely edentulous. Other neck: No other acute soft tissue abnormality within the neck. No mass or adenopathy. Upper chest: Visualized upper chest demonstrates no acute finding. Review of the MIP images confirms the above findings CTA HEAD FINDINGS Anterior circulation: Petrous segments widely patent. Mild for age atheromatous change within the carotid siphons without stenosis. A1 segments widely patent. Normal anterior communicating artery complex. Anterior cerebral arteries widely patent to their distal aspects. No M1 stenosis or occlusion. Normal MCA bifurcations. Distal MCA branches well perfused and symmetric. Posterior circulation: Both V4 segments  widely patent to the vertebrobasilar junction without stenosis. Both PICA origins patent and normal. Basilar widely patent to its distal aspect without stenosis. Superior cerebellar arteries patent bilaterally. Both PCAs primarily supplied via the basilar. Right PCA widely patent to its distal aspect. There is abrupt occlusion of the left PCA at the left P1/P2 junction (series 5, image 265). Venous sinuses: Grossly patent allowing for timing the contrast bolus. Anatomic variants: None significant.  No aneurysm. Review of the MIP images confirms the above findings IMPRESSION: 1. 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 2. Mild atheromatous disease elsewhere about the major arterial vasculature of the head and neck as above. No other hemodynamically significant or correctable stenosis. Electronically Signed   By: Rise Mu M.D.   On: 04/01/2021 22:48   MR BRAIN WO CONTRAST  Result Date: 04/01/2021 CLINICAL DATA:  Acute neuro deficit. Worsening right-sided weakness. EXAM: MRI HEAD WITHOUT CONTRAST TECHNIQUE: Multiplanar, multiecho pulse sequences of the brain and surrounding structures were obtained without intravenous contrast. COMPARISON:  CT head 04/01/2021.  MRI head 03/10/2020 FINDINGS: Brain: Acute infarct in the left hippocampus and left thalamus involving the left PCA territory. Small area of restricted diffusion in the high right frontal cortex measuring under 1 cm which may be subacute to acute infarct.  Chronic infarct involving the left operculum and frontal lobe on the left. Few small white matter hyperintensities. Negative for hemorrhage, mass, hydrocephalus Vascular: Negative for hyperdense vessel Skull and upper cervical spine: Negative Sinuses/Orbits: Mild mucosal edema paranasal sinuses. Right-sided cataract extraction Other: None IMPRESSION: Acute infarct left PCA territory involving the left hippocampus and left thalamus Probable small acute infarct in the high right  parietal lobe. This suggests emboli. Chronic infarct left frontal lobe Electronically Signed   By: Marlan Palau M.D.   On: 04/01/2021 19:36   DG SWALLOW FUNC OP MEDICARE SPEECH PATH  Result Date: 03/17/2021 CLINICAL DATA:  Dysphagia. EXAM: MODIFIED BARIUM SWALLOW TECHNIQUE: Different consistencies of barium were administered orally to the patient by the Speech Pathologist. Imaging of the pharynx was performed in the lateral projection. The radiologist was present in the fluoroscopy room for this study, providing personal supervision. FLUOROSCOPY TIME:  Fluoroscopy Time:  0.7 minute Radiation Exposure Index (if provided by the fluoroscopic device): 1.1 mGy Number of Acquired Spot Images: 0 COMPARISON:  None. FINDINGS: Real-time fluoroscopy of the swallowing function was performed with a speech pathologist present. Multiple consistencies of barium were administered which included water, nectar, applesauce, and Graham cracker. Two episodes of flash laryngeal penetration without aspiration during rapid back to back swallowing of water consistency. Otherwise no laryngeal penetration or tracheal aspiration. IMPRESSION: Modified barium swallow as detailed above. Please refer to the Speech Pathologists report for complete details and recommendations. Electronically Signed   By: Elige Ko   On: 03/17/2021 13:39 Objective Swallowing Evaluation: Type of Study: MBS-Modified Barium Swallow Study  Patient Details Name: MARKEY DEADY MRN: [id number] Date of Birth: 22-Oct-1951 Today's Date: 03/17/2021 Time: SLP Start Time (ACUTE ONLY): 1245 -SLP Stop Time (ACUTE ONLY): 1300 SLP Time Calculation (min) (ACUTE ONLY): 15 min Past Medical History: Past Medical History: Diagnosis Date . Diverticulosis  . GERD (gastroesophageal reflux disease)  . Heart murmur  . Hypercholesteremia  . Hypertension  . Mitral valve prolapse  Past Surgical History: Past Surgical History: Procedure Laterality Date . COLONOSCOPY WITH PROPOFOL N/A 03/06/2020   Procedure: COLONOSCOPY WITH PROPOFOL;  Surgeon: Toledo, Boykin Nearing, MD;  Location: [facility] ENDOSCOPY;  Service: Gastroenterology;  Laterality: N/A; . MITRAL VALVE REPLACEMENT   . RETINAL DETACHMENT SURGERY   . RIGHT/LEFT HEART CATH AND CORONARY ANGIOGRAPHY Bilateral 02/18/2017  Procedure: Right/Left Heart Cath and Coronary Angiography;  Surgeon: Marcina Millard, MD;  Location: [facility] INVASIVE CV LAB;  Service: Cardiovascular;  Laterality: Bilateral; . RIGHT/LEFT HEART CATH AND CORONARY ANGIOGRAPHY N/A 02/22/2020  Procedure: RIGHT/LEFT HEART CATH AND CORONARY ANGIOGRAPHY;  Surgeon: Marcina Millard, MD;  Location: [facility] INVASIVE CV LAB;  Service: Cardiovascular;  Laterality: N/A; . TEMPOROMANDIBULAR JOINT SURGERY   HPI: Grace Haggart is a 70 y.o. male with past medical history noted for GERD, CHF, HTN, CVA, aphasia referred by GI for MBS to assess oropharyngeal swallow function. Pt is known to SLP services from prior course of outpatient therapy for aphasia. 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 Patient is reporting globus sensation in mid to upper neck with solids, liquids.  Subjective: "It feels like there's something there right now" Assessment / Plan / Recommendation CHL IP CLINICAL IMPRESSIONS 03/17/2021 Clinical Impression Patient presents with oropharyngeal swallowing appearing grossly within functional limits. Oral stage is characterized by adequate bolus formation, containment, rotary mastication and anterior to posterior transfer. Pharyngeal swallow initiation occurs at the level of the valleculae for solids, with delay to pyriform sinuses with larger sips of liquids. Pharyngeal stage  is noted for adequate tongue base retraction, hyolaryngeal excursion, and pharyngeal constriction. Epiglottic deflection is complete. With larger/consecutive sips of thin liquid, there is trace, transient penetration x 2 during the swallow due to delayed closure of the laryngeal vestibule. There is no aspiration. There is no abnormal  pharyngeal residue. Cervical esophageal phase of swallowing appears within normal limits, with adequate amplitude/duration of cricopharyngeal opening and clearance of bolus through the cervical esophagus. Pt reported globus sensation prior to the assessment as well as during, with delayed throat clear x1 that was not associated with laryngeal penetration or residue. Pt reported that he found out he has been taking his omeprazole incorrectly and plans to adjust timing of dosage based on GI's recommendation. Encouraged pt to follow up with GI; may benefit from esophageal assessment. Provided handout with dietary/behavioral modifications for GERD should this aid symptoms. Recommend regular diet/thin liquids, meds whole with liquid. Advised pt to slow rate when eating, take small bites and sips, alternate solids and liquids. At this time no further skilled ST is indicated. SLP Visit Diagnosis Dysphagia, oropharyngeal phase (R13.12) Attention and concentration deficit following -- Frontal lobe and executive function deficit following -- Impact on safety and function Mild aspiration risk   CHL IP TREATMENT RECOMMENDATION 03/17/2021 Treatment Recommendations No treatment recommended at this time   No flowsheet data found. CHL IP DIET RECOMMENDATION 03/17/2021 SLP Diet Recommendations Regular solids;Thin liquid Liquid Administration via Cup Medication Administration Whole meds with liquid Compensations Slow rate;Small sips/bites;Multiple dry swallows after each bite/sip Postural Changes Remain semi-upright after after feeds/meals (Comment);Seated upright at 90 degrees   CHL IP OTHER RECOMMENDATIONS 03/17/2021 Recommended Consults Consider esophageal assessment Oral Care Recommendations Oral care BID Other Recommendations --   CHL IP FOLLOW UP RECOMMENDATIONS 03/17/2021 Follow up Recommendations None   No flowsheet data found.     CHL IP ORAL PHASE 03/17/2021 Oral Phase WFL Oral - Pudding Teaspoon -- Oral - Pudding Cup -- Oral -  Honey Teaspoon -- Oral - Honey Cup -- Oral - Nectar Teaspoon -- Oral - Nectar Cup -- Oral - Nectar Straw -- Oral - Thin Teaspoon -- Oral - Thin Cup -- Oral - Thin Straw -- Oral - Puree -- Oral - Mech Soft -- Oral - Regular -- Oral - Multi-Consistency -- Oral - Pill -- Oral Phase - Comment --  CHL IP PHARYNGEAL PHASE 03/17/2021 Pharyngeal Phase WFL Pharyngeal- Pudding Teaspoon -- Pharyngeal -- Pharyngeal- Pudding Cup -- Pharyngeal -- Pharyngeal- Honey Teaspoon -- Pharyngeal -- Pharyngeal- Honey Cup -- Pharyngeal -- Pharyngeal- Nectar Teaspoon -- Pharyngeal -- Pharyngeal- [facility] Cup [facility] Material does not enter airway Pharyngeal- Nectar Straw -- Pharyngeal -- Pharyngeal- Thin Teaspoon -- Pharyngeal -- Pharyngeal- Thin Cup WFL;Delayed swallow initiation-pyriform sinuses;Penetration/Aspiration during swallow Pharyngeal Material does not enter airway;Material enters airway, remains ABOVE vocal cords then ejected out Pharyngeal- Thin Straw -- Pharyngeal -- Pharyngeal- Puree WFL Pharyngeal Material does not enter airway Pharyngeal- Mechanical Soft WFL Pharyngeal Material does not enter airway Pharyngeal- Regular -- Pharyngeal -- Pharyngeal- Multi-consistency -- Pharyngeal -- Pharyngeal- Pill NT Pharyngeal -- Pharyngeal Comment --  CHL IP CERVICAL ESOPHAGEAL PHASE 03/17/2021 Cervical Esophageal Phase WFL Pudding Teaspoon -- Pudding Cup -- Honey Teaspoon -- Honey Cup -- Nectar Teaspoon -- Nectar Cup -- Nectar Straw -- Thin Teaspoon -- Thin Cup -- Thin Straw -- Puree -- Mechanical Soft -- Regular -- Multi-consistency -- Pill -- Cervical Esophageal Comment -- Rondel BatonMary Beth Bardin, MS, CCC-SLP Speech-Language Pathologist Arlana LindauMary E Bardin 03/17/2021, 1:44 PM  ECHOCARDIOGRAM COMPLETE  Result Date: 04/02/2021    ECHOCARDIOGRAM REPORT   Patient Name:   Terry [name] Date of Exam: 04/02/2021 Medical Rec #:  [id number]    Height:       67.0 in Accession #:    [id number]   Weight:       178.0 lb Date of Birth:  Oct 28, 1951    BSA:          1.925 m Patient Age:    69 years     BP:           122/86 mmHg Patient Gender: M            HR:           61 bpm. Exam Location:  [facility] Procedure: 2D Echo, Color Doppler and Cardiac Doppler Indications:     I63.9 Stroke  History:         Patient has prior history of Echocardiogram examinations, most                  recent 05/08/2020. Mitral Valve Disease; Risk                  Factors:Hypertension and HCL.  Sonographer:     Humphrey Rolls RDCS (AE) Referring Phys:  [id number] Rometta Emery Diagnosing Phys: Alwyn Pea MD  Sonographer Comments: No parasternal window and no subcostal window. IMPRESSIONS  1. Left ventricular ejection fraction, by estimation, is 55 to 60%. The left ventricle has normal function. The left ventricle has no regional wall motion abnormalities. Left ventricular diastolic parameters are consistent with Grade II diastolic dysfunction (pseudonormalization).  2. Right ventricular systolic function is normal. The right ventricular size is normal.  3. The mitral valve has been repaired/replaced. No evidence of mitral valve regurgitation. Echo findings are consistent with normal structure and function of the mitral valve prosthesis.  4. The aortic valve is normal in structure. Aortic valve regurgitation is not visualized. FINDINGS  Left Ventricle: Left ventricular ejection fraction, by estimation, is 55 to 60%. The left ventricle has normal function. The left ventricle has no regional wall motion abnormalities. The left ventricular internal cavity size was normal in size. There is  no left ventricular hypertrophy. Left ventricular diastolic parameters are consistent with Grade II diastolic dysfunction (pseudonormalization). Right Ventricle: The right ventricular size is normal. No increase in right ventricular wall thickness. Right ventricular systolic function is normal. Left Atrium: Left atrial size was normal in size. Right Atrium: Right atrial size was normal in size. Pericardium:  There is no evidence of pericardial effusion. Mitral Valve: The mitral valve has been repaired/replaced. No evidence of mitral valve regurgitation. There is a unknown size present in the mitral position. Echo findings are consistent with normal structure and function of the mitral valve prosthesis. MV peak gradient, 11.0 mmHg. The mean mitral valve gradient is 3.0 mmHg. Tricuspid Valve: The tricuspid valve is normal in structure. Tricuspid valve regurgitation is not demonstrated. Aortic Valve: The aortic valve is normal in structure. Aortic valve regurgitation is not visualized. Aortic valve mean gradient measures 9.0 mmHg. Aortic valve peak gradient measures 18.3 mmHg. Aortic valve area, by VTI measures 2.86 cm. Pulmonic Valve: The pulmonic valve was normal in structure. Pulmonic valve regurgitation is not visualized. Aorta: The ascending aorta was not well visualized. IAS/Shunts: No atrial level shunt detected by color flow Doppler.  LEFT VENTRICLE PLAX 2D LVOT diam:     2.70 cm      Diastology  LV SV:         109          LV e' medial:    4.90 cm/s LV SV Index:   57           LV E/e' medial:  24.1 LVOT Area:     5.73 cm     LV e' lateral:   5.00 cm/s                             LV E/e' lateral: 23.6  LV Volumes (MOD) LV vol d, MOD A2C: 52.9 ml LV vol d, MOD A4C: 104.0 ml LV vol s, MOD A2C: 26.1 ml LV vol s, MOD A4C: 57.0 ml LV SV MOD A2C:     26.8 ml LV SV MOD A4C:     104.0 ml LV SV MOD BP:      36.2 ml RIGHT VENTRICLE RV Basal diam:  3.20 cm LEFT ATRIUM             Index       RIGHT ATRIUM           Index LA Vol (A2C):   50.3 ml 26.14 ml/m RA Area:     12.10 cm LA Vol (A4C):   55.2 ml 28.68 ml/m RA Volume:   31.10 ml  16.16 ml/m LA Biplane Vol: 55.9 ml 29.05 ml/m  AORTIC VALVE                    PULMONIC VALVE AV Area (Vmax):    2.70 cm     PV Vmax:       1.39 m/s AV Area (Vmean):   2.79 cm     PV Vmean:      88.600 cm/s AV Area (VTI):     2.86 cm     PV VTI:        0.253 m AV Vmax:           214.00  cm/s  PV Peak grad:  7.7 mmHg AV Vmean:          139.000 cm/s PV Mean grad:  4.0 mmHg AV VTI:            0.383 m AV Peak Grad:      18.3 mmHg AV Mean Grad:      9.0 mmHg LVOT Vmax:         101.00 cm/s LVOT Vmean:        67.700 cm/s LVOT VTI:          0.191 m LVOT/AV VTI ratio: 0.50  AORTA Ao Root diam: 3.90 cm MITRAL VALVE MV Area (PHT): 2.47 cm     SHUNTS MV Area VTI:   2.87 cm     Systemic VTI:  0.19 m MV Peak grad:  11.0 mmHg    Systemic Diam: 2.70 cm MV Mean grad:  3.0 mmHg MV Vmax:       1.66 m/s MV Vmean:      86.2 cm/s MV Decel Time: 307 msec MV E velocity: 118.00 cm/s MV A velocity: 170.00 cm/s MV E/A ratio:  0.69 Dwayne D Callwood MD Electronically signed by Alwyn Pea MD Signature Date/Time: 04/02/2021/2:59:33 PM    Final    DG ESOPHAGUS W DOUBLE CM (HD)  Result Date: 03/28/2021 CLINICAL DATA:  Productive cough, choking on liquids and solids EXAM: ESOPHOGRAM / BARIUM SWALLOW / BARIUM TABLET STUDY TECHNIQUE: Combined double  contrast and single contrast examination performed using effervescent crystals, thick barium liquid, and thin barium liquid. The patient was observed with fluoroscopy swallowing a 13 mm barium sulphate tablet. FLUOROSCOPY TIME:  Fluoroscopy Time:  0.8 minute Radiation Exposure Index (if provided by the fluoroscopic device): 3.9 mGy Number of Acquired Spot Images: 0 COMPARISON:  None. FINDINGS: Normal pharyngeal anatomy and motility. Contrast flowed freely through the esophagus without evidence of a stricture or mass. Normal esophageal mucosa without evidence of irregularity or ulceration. Esophageal motility was normal. No evidence of reflux. Small transient hiatal hernia. At the end of the examination a 13 mm barium tablet was administered which transited through the esophagus and esophagogastric junction without delay. IMPRESSION: Small transient hiatal hernia.  Otherwise normal barium swallow. Electronically Signed   By: Elige Ko   On: 03/28/2021 14:24        Discharge Exam: Vitals:   04/03/21 0331 04/03/21 0749  BP: 113/81 121/83  Pulse: 67 71  Resp: 16 15  Temp: (!) 97.4 F (36.3 C) 97.9 F (36.6 C)  SpO2: 96% 96%    General: Pt is alert, awake, not in acute distress Cardiovascular: RRR, S1/S2 +, no edema Respiratory: CTA bilaterally, no wheezing, no rhonchi, no respiratory distress, no conversational dyspnea  Abdominal: Soft, NT, ND, bowel sounds + Extremities: no edema, no cyanosis Psych: Normal mood and affect, stable judgement and insight     The results of significant diagnostics from this hospitalization (including imaging, microbiology, ancillary and laboratory) are listed below for reference.     Microbiology: Recent Results (from the past 240 hour(s))  SARS CORONAVIRUS 2 (TAT 6-24 HRS) Nasopharyngeal Nasopharyngeal Swab     Status: None   Collection Time: 04/01/21  4:55 PM   Specimen: Nasopharyngeal Swab  Result Value Ref Range Status   SARS Coronavirus 2 NEGATIVE NEGATIVE Final    Comment: (NOTE) SARS-CoV-2 target nucleic acids are NOT DETECTED.  The SARS-CoV-2 RNA is generally detectable in upper and lower respiratory specimens during the acute phase of infection. Negative results do not preclude SARS-CoV-2 infection, do not rule out co-infections with other pathogens, and should not be used as the sole basis for treatment or other patient management decisions. Negative results must be combined with clinical observations, patient history, and epidemiological information. The expected result is Negative.  Fact Sheet for Patients: HairSlick.no  Fact Sheet for Healthcare Providers: quierodirigir.com  This test is not yet approved or cleared by the [place] FDA and  has been authorized for detection and/or diagnosis of SARS-CoV-2 by FDA under an Emergency Use Authorization (EUA). This EUA will remain  in effect (meaning this test can be used) for  the duration of the COVID-19 declaration under Se ction 564(b)(1) of the Act, 21 U.S.C. section 360bbb-3(b)(1), unless the authorization is terminated or revoked sooner.  Performed at [facility], [address], [place], [place] [address]      Labs: BNP (last 3 results) No results for input(s): BNP in the last 8760 hours. Basic Metabolic Panel: Recent Labs  Lab 04/01/21 1529 04/02/21 0432  NA 137 138  K 4.1 3.6  CL 103 106  CO2 22 23  GLUCOSE 143* 113*  BUN 21 22  CREATININE 0.95 0.97  CALCIUM 9.5 9.2   Liver Function Tests: Recent Labs  Lab 04/01/21 1529 04/02/21 0432  AST 33 23  ALT 42 32  ALKPHOS 57 44  BILITOT 1.2 1.1  PROT 7.1 6.2*  ALBUMIN 4.5 3.6   No  results for input(s): LIPASE, AMYLASE in the last 168 hours. No results for input(s): AMMONIA in the last 168 hours. CBC: Recent Labs  Lab 04/01/21 1529 04/02/21 0432  WBC 7.6 11.9*  NEUTROABS 6.6  --   HGB 16.1 15.6  HCT 45.9 43.4  MCV 100.9* 100.0  PLT 151 153   Cardiac Enzymes: No results for input(s): CKTOTAL, CKMB, CKMBINDEX, TROPONINI in the last 168 hours. BNP: Invalid input(s): POCBNP CBG: Recent Labs  Lab 04/01/21 1517  GLUCAP 125*   D-Dimer No results for input(s): DDIMER in the last 72 hours. Hgb A1c Recent Labs    04/02/21 0432  HGBA1C 5.6   Lipid Profile Recent Labs    04/02/21 0432  CHOL 186  HDL 53  LDLCALC 110*  TRIG 115  CHOLHDL 3.5   Thyroid function studies No results for input(s): TSH, T4TOTAL, T3FREE, THYROIDAB in the last 72 hours.  Invalid input(s): FREET3 Anemia work up No results for input(s): VITAMINB12, FOLATE, FERRITIN, TIBC, IRON, RETICCTPCT in the last 72 hours. Urinalysis No results found for: COLORURINE, APPEARANCEUR, LABSPEC, PHURINE, GLUCOSEU, HGBUR, BILIRUBINUR, KETONESUR, PROTEINUR, UROBILINOGEN, NITRITE, LEUKOCYTESUR Sepsis Labs Invalid input(s): PROCALCITONIN,  WBC,  LACTICIDVEN Microbiology Recent Results (from the past 240  hour(s))  SARS CORONAVIRUS 2 (TAT 6-24 HRS) Nasopharyngeal Nasopharyngeal Swab     Status: None   Collection Time: 04/01/21  4:55 PM   Specimen: Nasopharyngeal Swab  Result Value Ref Range Status   SARS Coronavirus 2 NEGATIVE NEGATIVE Final    Comment: (NOTE) SARS-CoV-2 target nucleic acids are NOT DETECTED.  The SARS-CoV-2 RNA is generally detectable in upper and lower respiratory specimens during the acute phase of infection. Negative results do not preclude SARS-CoV-2 infection, do not rule out co-infections with other pathogens, and should not be used as the sole basis for treatment or other patient management decisions. Negative results must be combined with clinical observations, patient history, and epidemiological information. The expected result is Negative.  Fact Sheet for Patients: HairSlick.no  Fact Sheet for Healthcare Providers: quierodirigir.com  This test is not yet approved or cleared by the [place] FDA and  has been authorized for detection and/or diagnosis of SARS-CoV-2 by FDA under an Emergency Use Authorization (EUA). This EUA will remain  in effect (meaning this test can be used) for the duration of the COVID-19 declaration under Se ction 564(b)(1) of the Act, 21 U.S.C. section 360bbb-3(b)(1), unless the authorization is terminated or revoked sooner.  Performed at [facility], [name] [place], [place] [address]      Patient was seen and examined on the day of discharge and was found to be in stable condition. Time coordinating discharge: 25 minutes including assessment and coordination of care, as well as examination of the patient.   SIGNED:  Noralee Stain, DO [facility] 04/03/2021, 8:27 AM

## 2021-06-10 ENCOUNTER — Other Ambulatory Visit: Payer: Self-pay

## 2021-06-10 ENCOUNTER — Ambulatory Visit: Payer: Medicare HMO | Attending: Neurology | Admitting: Speech Pathology

## 2021-06-10 DIAGNOSIS — R471 Dysarthria and anarthria: Secondary | ICD-10-CM | POA: Diagnosis present

## 2021-06-10 DIAGNOSIS — R4701 Aphasia: Secondary | ICD-10-CM | POA: Insufficient documentation

## 2021-06-10 DIAGNOSIS — R482 Apraxia: Secondary | ICD-10-CM | POA: Diagnosis present

## 2021-06-10 DIAGNOSIS — I639 Cerebral infarction, unspecified: Secondary | ICD-10-CM | POA: Insufficient documentation

## 2021-06-12 NOTE — Therapy (Signed)
[facility] MAIN [facility] SERVICES [address] [place], [place], [address] Phone: [phone]   Fax:  [phone]  Speech Language Pathology Evaluation  Patient Details  Name: Terry [name] MRN: [id number] Date of Birth: 30-Sep-1951 Referring Provider (SLP): Dr Theora Master   Encounter Date: 06/10/2021   End of Session - 06/12/21 1159     Visit Number 1    Number of Visits 25    Date for SLP Re-Evaluation 09/02/21    Authorization Type [employer]    Authorization Time Period 06/10/2021 thru 09/02/2021    Authorization - Visit Number 1    Progress Note Due on Visit 10    SLP Start Time 1440    SLP Stop Time  1540    SLP Time Calculation (min) 60 min    Activity Tolerance Patient tolerated treatment well             Past Medical History:  Diagnosis Date   Diverticulosis    GERD (gastroesophageal reflux disease)    Heart murmur    Hypercholesteremia    Hypertension    Mitral valve prolapse     Past Surgical History:  Procedure Laterality Date   COLONOSCOPY WITH PROPOFOL N/A 03/06/2020   Procedure: COLONOSCOPY WITH PROPOFOL;  Surgeon: Toledo, Boykin Nearing, MD;  Location: [facility] ENDOSCOPY;  Service: Gastroenterology;  Laterality: N/A;   MITRAL VALVE REPLACEMENT     RETINAL DETACHMENT SURGERY     RIGHT/LEFT HEART CATH AND CORONARY ANGIOGRAPHY Bilateral 02/18/2017   Procedure: Right/Left Heart Cath and Coronary Angiography;  Surgeon: Marcina Millard, MD;  Location: [facility] INVASIVE CV LAB;  Service: Cardiovascular;  Laterality: Bilateral;   RIGHT/LEFT HEART CATH AND CORONARY ANGIOGRAPHY N/A 02/22/2020   Procedure: RIGHT/LEFT HEART CATH AND CORONARY ANGIOGRAPHY;  Surgeon: Marcina Millard, MD;  Location: [facility] INVASIVE CV LAB;  Service: Cardiovascular;  Laterality: N/A;   TEMPOROMANDIBULAR JOINT SURGERY      There were no vitals filed for this visit.   Subjective Assessment - 06/12/21 1153     Subjective pleasant, appears to be  a good historian    Currently in Pain? No/denies                SLP Evaluation [facility] - 06/12/21 1153       SLP Visit Information   SLP Received On 06/10/21    Referring Provider (SLP) Dr Theora Master    Onset Date 05/19/2021    Medical Diagnosis CVA      Subjective   Patient/Family Stated Goal To communicate effectively with all listeners      General Information   HPI Terry [name] is a 70 y.o. male with medical history significant of previous  hypertension, hyperlipidemia, history of mitral valve prolapse s/p mitral valve replacement, GERD. 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 More recent Cva occurred on 04/01/2021 with MRI revealing acute infarct left PCA territory involving the left hippocampus and left thalamus. Probable small acute infarct in the high right parietal lobe. This suggests emboli. Chronic infarct left frontal lobe.    Behavioral/Cognition appropriate    Mobility Status ambulatory      Balance Screen   Has the patient fallen in the past 6 months No      Prior Functional Status   Cognitive/Linguistic Baseline Within functional limits    Type of Home House     Lives  With Spouse    Available Support Family    Vocation Retired      IT consultant   Overall Cognitive Status Within Functional Limits for tasks assessed      Auditory Comprehension   Overall Auditory Comprehension Appears within functional limits for tasks assessed      Visual Recognition/Discrimination   Discrimination Within Function Limits      Reading Comprehension   Reading Status Within funtional limits      Expression   Primary Mode of Expression Verbal      Verbal Expression   Overall Verbal Expression Impaired    Initiation No impairment    Automatic Speech Name;Social Response    Level of Generative/Spontaneous Verbalization Conversation     Repetition No impairment    Naming No impairment    Pragmatics No impairment    Interfering Components Other (comment)   HOH   Non-Verbal Means of Communication Not applicable    Other Verbal Expression Comments high level wrod finding deficits      Written Expression   Dominant Hand Right      Oral Motor/Sensory Function   Overall Oral Motor/Sensory Function Impaired    Labial ROM Reduced right    Labial Symmetry Abnormal symmetry right    Labial Strength Reduced Right    Labial Sensation Reduced Right    Labial Coordination WFL    Lingual ROM Within Functional Limits    Lingual Symmetry Within Functional Limits    Lingual Strength Within Functional Limits    Lingual Sensation Within Functional Limits    Lingual Coordination WFL    Facial ROM Within Functional Limits    Facial Symmetry Within Functional Limits    Facial Strength Within Functional Limits    Facial Sensation Reduced    Facial Coordination WFL    Velum Within Functional Limits    Mandible Within Functional Limits      Motor Speech   Overall Motor Speech Impaired    Respiration Within functional limits    Phonation Normal    Resonance Within functional limits    Articulation Within functional limitis    Intelligibility Intelligible    Motor Planning Impaired    Level of Impairment Conversation    Motor Speech Errors Aware;Inconsistent    Interfering Components Hearing loss    Effective Techniques Slow rate    Phonation [facility]                             SLP Education - 06/12/21 1159     Education Details ST POC    Person(s) Educated Patient    Methods Explanation;Verbal cues    Comprehension Verbalized understanding              SLP Short Term Goals - 06/12/21 1201       SLP SHORT TERM GOAL #1   Title Pt will utilize speech intelligibility strategies at the paragraph level to achieve > 95% speech intelligibility.    Baseline new goal    Time 10    Period --   sessions    Status New      SLP SHORT TERM GOAL #2   Title Given an abstract concept, pt will provide at least 3 organized cogent statements in 7 out of 10 opportunities.    Baseline new goal    Time 10    Period --   sessions   Status New      SLP SHORT  TERM GOAL #3   Title Pt will self-monitor and self- correct errors in speech intelligibility in 7 out of 10 opportunities.    Time 10    Period --   sessions   Status New              SLP Long Term Goals - 06/12/21 1205       SLP LONG TERM GOAL #1   Title Pt will utilize speech intelligibility strategies to achieve > 95% intelligibility at the complex conversation level.    Baseline new goal    Time 12    Period Weeks    Status New    Target Date 09/02/21      SLP LONG TERM GOAL #2   Title Pt will use word finding strategies to abstract and complex information with modified independence.    Baseline new goal    Time 12    Period Weeks    Status New    Target Date 09/02/21              Plan - 06/12/21 1200     Clinical Impression Statement Pt referred on 05/19/2021 by his neurologist, Dr Malvin Johns, for speech-language. Pt presents with higher level word finding deficits that are severely exacerbated by distractions- such as communication partners interjecting. When these difficulties arise, pt becomes very dysfluent with phonemic perseveration. He reports "I cannot get the words out, if the stuttering and frustration starts, I will shut down and shut up." He further reports that after stroke a year ago, pt had returned to ~ 95% in his ability to communicate effectively, currently he reports being at ~ 50% effective. He also reports that his lips are acutely numb with his right side being more impacted. This doesn't appear to affect his speech intelligibility and he notes that he is careful when eating or taking pills. Pt is also hard of hearing and benefits from lip reading. Skilled ST intervention is required to target the above  mentioned deficits to increase pt's functional independence, return to leisure and community activities.    Speech Therapy Frequency 2x / week    Duration 12 weeks    Treatment/Interventions SLP instruction and feedback;Patient/family education;Compensatory techniques;Language facilitation    Potential to Achieve Goals Good    Consulted and Agree with Plan of Care Patient             Patient will benefit from skilled therapeutic intervention in order to improve the following deficits and impairments:   Aphasia  Dysarthria and anarthria  Apraxia  Acute CVA (cerebrovascular accident) [facility])    Problem List Patient Active Problem List   Diagnosis Date Noted   Chronic anticoagulation 05/11/2020   Acute CVA (cerebrovascular accident) (HCC) 05/08/2020   Hypercholesteremia    Hypertension    Stroke [facility])    History of mitral valve replacement    Leg pain 09/27/2017   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 Dreama Saa M.S., CCC-SLP, [facility] Speech-Language Pathologist Rehabilitation Services Office [phone]  Reuel Derby 06/12/2021, 12:07 PM  [facility] MAIN [facility] SERVICES [address] [place], [place], [address] Phone: [phone]   Fax:  [phone]  Name: KHIEM GARGIS MRN: [id number] Date of Birth: 1951-05-30

## 2021-06-13 ENCOUNTER — Other Ambulatory Visit: Payer: Self-pay

## 2021-06-13 ENCOUNTER — Ambulatory Visit: Payer: Medicare HMO | Admitting: Speech Pathology

## 2021-06-13 DIAGNOSIS — R4701 Aphasia: Secondary | ICD-10-CM | POA: Diagnosis not present

## 2021-06-13 DIAGNOSIS — I639 Cerebral infarction, unspecified: Secondary | ICD-10-CM

## 2021-06-13 NOTE — Therapy (Signed)
[place] [facility] MAIN [facility] SERVICES [address] [place], [place], [address] Phone: [phone]   Fax:  [phone]  Speech Language Pathology Treatment  Patient Details  Name: Terry [name] MRN: [id number] Date of Birth: 03-17-1951 Referring Provider (SLP): Dr Theora Master   Encounter Date: 06/13/2021   End of Session - 06/13/21 1039     Visit Number 2    Number of Visits 25    Date for SLP Re-Evaluation 09/02/21    Authorization Type [employer] Medicare    Authorization Time Period 06/10/2021 thru 09/02/2021    Authorization - Visit Number 2    Progress Note Due on Visit 10    SLP Start Time 0900    SLP Stop Time  1000    SLP Time Calculation (min) 60 min    Activity Tolerance Patient tolerated treatment well             Past Medical History:  Diagnosis Date   Diverticulosis    GERD (gastroesophageal reflux disease)    Heart murmur    Hypercholesteremia    Hypertension    Mitral valve prolapse     Past Surgical History:  Procedure Laterality Date   COLONOSCOPY WITH PROPOFOL N/A 03/06/2020   Procedure: COLONOSCOPY WITH PROPOFOL;  Surgeon: Toledo, Boykin Nearing, MD;  Location: [facility] ENDOSCOPY;  Service: Gastroenterology;  Laterality: N/A;   MITRAL VALVE REPLACEMENT     RETINAL DETACHMENT SURGERY     RIGHT/LEFT HEART CATH AND CORONARY ANGIOGRAPHY Bilateral 02/18/2017   Procedure: Right/Left Heart Cath and Coronary Angiography;  Surgeon: Marcina Millard, MD;  Location: [facility] INVASIVE CV LAB;  Service: Cardiovascular;  Laterality: Bilateral;   RIGHT/LEFT HEART CATH AND CORONARY ANGIOGRAPHY N/A 02/22/2020   Procedure: RIGHT/LEFT HEART CATH AND CORONARY ANGIOGRAPHY;  Surgeon: Marcina Millard, MD;  Location: [facility] INVASIVE CV LAB;  Service: Cardiovascular;  Laterality: N/A;   TEMPOROMANDIBULAR JOINT SURGERY      There were no vitals filed for this visit.   Subjective Assessment - 06/13/21 1035     Subjective pleasant, talking about  thunderstorm from previous evening    Currently in Pain? No/denies                   ADULT SLP TREATMENT - 06/13/21 0001       Cognitive-Linquistic Treatment   Skilled Treatment WORD FINDING: Minimal assistance to formulate descriptive sentence within set parameters SPEECH INTELLIGIBILITY: Independently achieve 100% when conveying unknown sentence level utterances              SLP Education - 06/13/21 1039     Education Details word finding strategies    Person(s) Educated Patient    Methods Explanation;Demonstration    Comprehension Returned demonstration;Verbalized understanding;Need further instruction              SLP Short Term Goals - 06/12/21 1201       SLP SHORT TERM GOAL #1   Title Pt will utilize speech intelligibility strategies at the paragraph level to achieve > 95% speech intelligibility.    Baseline new goal    Time 10    Period --   sessions   Status New      SLP SHORT TERM GOAL #2   Title Given an abstract concept, pt will provide at least 3 organized cogent statements in 7 out of 10 opportunities.    Baseline new goal    Time 10    Period --   sessions   Status New  SLP SHORT TERM GOAL #3   Title Pt will self-monitor and self- correct errors in speech intelligibility in 7 out of 10 opportunities.    Time 10    Period --   sessions   Status New              SLP Long Term Goals - 06/12/21 1205       SLP LONG TERM GOAL #1   Title Pt will utilize speech intelligibility strategies to achieve > 95% intelligibility at the complex conversation level.    Baseline new goal    Time 12    Period Weeks    Status New    Target Date 09/02/21      SLP LONG TERM GOAL #2   Title Pt will use word finding strategies to abstract and complex information with modified independence.    Baseline new goal    Time 12    Period Weeks    Status New    Target Date 09/02/21              Plan - 06/13/21 1040     Clinical Impression  Statement Pt presents iwth mild higher level word finding deficits as well as occasionally dysfluent speech when conveying complex or abastract information. He responded well to formulating descriptive sentences but he did require more than a reasonable amount of time before responding. As such, pt requires skilled ST intervention to developing word finding and speech intelligibility strategies to effectively communication complex information thus increasing his functional independence.    Speech Therapy Frequency 2x / week    Duration 12 weeks    Treatment/Interventions SLP instruction and feedback;Patient/family education;Compensatory techniques;Language facilitation    Potential to Achieve Goals Good    Consulted and Agree with Plan of Care Patient             Patient will benefit from skilled therapeutic intervention in order to improve the following deficits and impairments:   Aphasia  Acute CVA (cerebrovascular accident) [facility])    Problem List Patient Active Problem List   Diagnosis Date Noted   Chronic anticoagulation 05/11/2020   Acute CVA (cerebrovascular accident) (HCC) 05/08/2020   Hypercholesteremia    Hypertension    Stroke [facility])    History of mitral valve replacement    Leg pain 09/27/2017   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   Venous insufficiency 09/27/2017   GERD (gastroesophageal reflux disease) 09/27/2017   [name] M.S., CCC-SLP, [facility] Speech-Language Pathologist Rehabilitation Services Office [phone]  Reuel Derby 06/13/2021, 10:43 AM  [place] [facility] MAIN [facility] SERVICES [address] [place], [place], [address] Phone: [phone]   Fax:  [phone]   Name: Terry [name] MRN: [id number] Date of Birth: Aug 25, 1951

## 2021-06-16 ENCOUNTER — Ambulatory Visit: Payer: Medicare HMO | Admitting: Speech Pathology

## 2021-06-16 ENCOUNTER — Other Ambulatory Visit: Payer: Self-pay

## 2021-06-16 DIAGNOSIS — R4701 Aphasia: Secondary | ICD-10-CM

## 2021-06-16 DIAGNOSIS — R482 Apraxia: Secondary | ICD-10-CM

## 2021-06-16 DIAGNOSIS — I639 Cerebral infarction, unspecified: Secondary | ICD-10-CM

## 2021-06-16 NOTE — Patient Instructions (Signed)
Begin controlling frustration, attempt different strategies to halt interjections

## 2021-06-16 NOTE — Therapy (Signed)
[place] [facility] MAIN [facility] SERVICES [address] [place], [place], [address] Phone: [phone]   Fax:  [phone]  Speech Language Pathology Treatment  Patient Details  Name: KEYDEN PAVLOV MRN: [id number] Date of Birth: 02/09/1951 Referring Provider (SLP): Dr Theora Master   Encounter Date: 06/16/2021   End of Session - 06/16/21 1600     Visit Number 3    Number of Visits 25    Date for SLP Re-Evaluation 09/02/21    Authorization Type [employer] Medicare    Authorization Time Period 06/10/2021 thru 09/02/2021    Authorization - Visit Number 3    Progress Note Due on Visit 10    SLP Start Time 1000    SLP Stop Time  1100    SLP Time Calculation (min) 60 min    Activity Tolerance Patient tolerated treatment well             Past Medical History:  Diagnosis Date   Diverticulosis    GERD (gastroesophageal reflux disease)    Heart murmur    Hypercholesteremia    Hypertension    Mitral valve prolapse     Past Surgical History:  Procedure Laterality Date   COLONOSCOPY WITH PROPOFOL N/A 03/06/2020   Procedure: COLONOSCOPY WITH PROPOFOL;  Surgeon: Toledo, Boykin Nearing, MD;  Location: [facility] ENDOSCOPY;  Service: Gastroenterology;  Laterality: N/A;   MITRAL VALVE REPLACEMENT     RETINAL DETACHMENT SURGERY     RIGHT/LEFT HEART CATH AND CORONARY ANGIOGRAPHY Bilateral 02/18/2017   Procedure: Right/Left Heart Cath and Coronary Angiography;  Surgeon: Marcina Millard, MD;  Location: [facility] INVASIVE CV LAB;  Service: Cardiovascular;  Laterality: Bilateral;   RIGHT/LEFT HEART CATH AND CORONARY ANGIOGRAPHY N/A 02/22/2020   Procedure: RIGHT/LEFT HEART CATH AND CORONARY ANGIOGRAPHY;  Surgeon: Marcina Millard, MD;  Location: [facility] INVASIVE CV LAB;  Service: Cardiovascular;  Laterality: N/A;   TEMPOROMANDIBULAR JOINT SURGERY      There were no vitals filed for this visit.   Subjective Assessment - 06/16/21 1558     Subjective "I start stuttering, I  get frustrated and I shut down," "I can't slow my speech because I will never get a word in edge wise"    Currently in Pain? No/denies                   ADULT SLP TREATMENT - 06/16/21 0001       Cognitive-Linquistic Treatment   Skilled Treatment WORD FINDING: Able to decrease thought organization time from 10 seconds to 5 seconds while maintaining accurate description of random word (100% accuracy); accuracy declined to ~ 70% when utilizing a 5 second prep time and competing noise/distractions              SLP Education - 06/16/21 1559     Education Details strategies to increase fluency of speech    Person(s) Educated Patient    Methods Explanation;Demonstration    Comprehension Need further instruction              SLP Short Term Goals - 06/12/21 1201       SLP SHORT TERM GOAL #1   Title Pt will utilize speech intelligibility strategies at the paragraph level to achieve > 95% speech intelligibility.    Baseline new goal    Time 10    Period --   sessions   Status New      SLP SHORT TERM GOAL #2   Title Given an abstract concept, pt will provide  at least 3 organized cogent statements in 7 out of 10 opportunities.    Baseline new goal    Time 10    Period --   sessions   Status New      SLP SHORT TERM GOAL #3   Title Pt will self-monitor and self- correct errors in speech intelligibility in 7 out of 10 opportunities.    Time 10    Period --   sessions   Status New              SLP Long Term Goals - 06/12/21 1205       SLP LONG TERM GOAL #1   Title Pt will utilize speech intelligibility strategies to achieve > 95% intelligibility at the complex conversation level.    Baseline new goal    Time 12    Period Weeks    Status New    Target Date 09/02/21      SLP LONG TERM GOAL #2   Title Pt will use word finding strategies to abstract and complex information with modified independence.    Baseline new goal    Time 12    Period Weeks     Status New    Target Date 09/02/21              Plan - 06/16/21 1602     Clinical Impression Statement Pt presents with mild higher level word finding deficits as well as occasionally dysfluent speech when conveying complex or abstract information. This appears further complicated by difficult pragmatics between he and his wife. Pt responds well to therapy task but is less than hopeful about carrying over strategies of slow rate and inhibiting negative emotional responses. When faced with time constraint and competing noise, he accuracy did decline from 100% accuracy to ~ 70% accuracy. As such, pt requires skilled ST intervention to developing word finding and speech intelligibility strategies to effectively communication complex information thus increasing his functional independence.    Speech Therapy Frequency 2x / week    Duration 12 weeks    Treatment/Interventions SLP instruction and feedback;Patient/family education;Compensatory techniques;Language facilitation    Potential to Achieve Goals Good    Potential Considerations Other (comment)   emotional responses/shutting down as a response   SLP Home Exercise Plan provided    Consulted and Agree with Plan of Care Patient             Patient will benefit from skilled therapeutic intervention in order to improve the following deficits and impairments:   Aphasia  Apraxia  Acute CVA (cerebrovascular accident) [facility])    Problem List Patient Active Problem List   Diagnosis Date Noted   Chronic anticoagulation 05/11/2020   Acute CVA (cerebrovascular accident) (HCC) 05/08/2020   Hypercholesteremia    Hypertension    Stroke [facility])    History of mitral valve replacement    Leg pain 09/27/2017   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   Venous insufficiency 09/27/2017   GERD (gastroesophageal reflux disease) 09/27/2017   Kyvon Hu B. Dreama Saa M.S., CCC-SLP, [facility] Speech-Language Pathologist Rehabilitation Services Office  [phone]  Reuel Derby 06/16/2021, 4:06 PM  [place] [facility] MAIN [facility] SERVICES [address] [place], [place], [address] Phone: [phone]   Fax:  [phone]   Name: SALEEM COCCIA MRN: [id number] Date of Birth: [birth year]

## 2021-06-20 ENCOUNTER — Other Ambulatory Visit: Payer: Self-pay

## 2021-06-20 ENCOUNTER — Ambulatory Visit: Payer: Medicare HMO | Admitting: Speech Pathology

## 2021-06-20 DIAGNOSIS — I639 Cerebral infarction, unspecified: Secondary | ICD-10-CM

## 2021-06-20 DIAGNOSIS — R4701 Aphasia: Secondary | ICD-10-CM | POA: Diagnosis not present

## 2021-06-20 DIAGNOSIS — R482 Apraxia: Secondary | ICD-10-CM

## 2021-06-20 NOTE — Therapy (Addendum)
[place] [facility] MAIN [facility] SERVICES [address] [place], [place], [address] Phone: [phone]   Fax:  [phone]  Speech Language Pathology Treatment  Patient Details  Name: CALE BETHARD MRN: [id number] Date of Birth: 1951-02-10 Referring Provider (SLP): Dr Theora Master   Encounter Date: 06/20/2021   End of Session - 06/20/21 1306     Visit Number 4    Number of Visits 25    Date for SLP Re-Evaluation 09/02/21    Authorization Type [employer] Medicare    Authorization Time Period 06/10/2021 thru 09/02/2021    Authorization - Visit Number 4    Progress Note Due on Visit 10    SLP Start Time 1000    SLP Stop Time  1100    SLP Time Calculation (min) 60 min    Activity Tolerance Patient tolerated treatment well             Past Medical History:  Diagnosis Date   Diverticulosis    GERD (gastroesophageal reflux disease)    Heart murmur    Hypercholesteremia    Hypertension    Mitral valve prolapse     Past Surgical History:  Procedure Laterality Date   COLONOSCOPY WITH PROPOFOL N/A 03/06/2020   Procedure: COLONOSCOPY WITH PROPOFOL;  Surgeon: Toledo, Boykin Nearing, MD;  Location: [facility] ENDOSCOPY;  Service: Gastroenterology;  Laterality: N/A;   MITRAL VALVE REPLACEMENT     RETINAL DETACHMENT SURGERY     RIGHT/LEFT HEART CATH AND CORONARY ANGIOGRAPHY Bilateral 02/18/2017   Procedure: Right/Left Heart Cath and Coronary Angiography;  Surgeon: Marcina Millard, MD;  Location: [facility] INVASIVE CV LAB;  Service: Cardiovascular;  Laterality: Bilateral;   RIGHT/LEFT HEART CATH AND CORONARY ANGIOGRAPHY N/A 02/22/2020   Procedure: RIGHT/LEFT HEART CATH AND CORONARY ANGIOGRAPHY;  Surgeon: Marcina Millard, MD;  Location: [facility] INVASIVE CV LAB;  Service: Cardiovascular;  Laterality: N/A;   TEMPOROMANDIBULAR JOINT SURGERY      There were no vitals filed for this visit.   Subjective Assessment - 06/20/21 1306     Subjective pleasant, conversant     Currently in Pain? No/denies                   ADULT SLP TREATMENT - 06/20/21 0001       Cognitive-Linquistic Treatment   Skilled Treatment WORD FINDING: Maximal assistance to describe semi-abstract words according to properties, function, location, group, action, and association             SLP Education - 06/20/21 1306     Education Details provided    [employer]) Educated Patient    Methods Explanation;Demonstration;Handout    Comprehension Verbalized understanding;Need further instruction              SLP Short Term Goals - 06/12/21 1201       SLP SHORT TERM GOAL #1   Title Pt will utilize speech intelligibility strategies at the paragraph level to achieve > 95% speech intelligibility.    Baseline new goal    Time 10    Period --   sessions   Status New      SLP SHORT TERM GOAL #2   Title Given an abstract concept, pt will provide at least 3 organized cogent statements in 7 out of 10 opportunities.    Baseline new goal    Time 10    Period --   sessions   Status New      SLP SHORT TERM GOAL #3   Title Pt  will self-monitor and self- correct errors in speech intelligibility in 7 out of 10 opportunities.    Time 10    Period --   sessions   Status New              SLP Long Term Goals - 06/12/21 1205       SLP LONG TERM GOAL #1   Title Pt will utilize speech intelligibility strategies to achieve > 95% intelligibility at the complex conversation level.    Baseline new goal    Time 12    Period Weeks    Status New    Target Date 09/02/21      SLP LONG TERM GOAL #2   Title Pt will use word finding strategies to abstract and complex information with modified independence.    Baseline new goal    Time 12    Period Weeks    Status New    Target Date 09/02/21              Plan - 06/20/21 1307     Clinical Impression Statement Pt presents with mild higher level word finding deficits as well as occasionally dysfluent speech when  conveying complex or abstract information. This appears further complicated by pt's decreased mental flexibility in utilizing compensatory word finding strategies during moments of anomia.  Word finding strategy of describing anomic word was introduced. Pt had immense difficulty shifting from a one-word description to including semantic features. When offering a 1-2-word description, listener was not able to figure out word being described d/t limited information and the information provided was also based on pt's knowledge base, not the listener's. As such, pt requires skilled ST intervention to developing word finding and speech intelligibility strategies to effectively communication complex information thus increasing his functional independence.    Speech Therapy Frequency 2x / week    Duration 12 weeks    Treatment/Interventions SLP instruction and feedback;Patient/family education;Compensatory techniques;Language facilitation    Potential to Achieve Goals Good    SLP Home Exercise Plan provided    Consulted and Agree with Plan of Care Patient             Patient will benefit from skilled therapeutic intervention in order to improve the following deficits and impairments:   Aphasia  Apraxia  Acute CVA (cerebrovascular accident) [facility])    Problem List Patient Active Problem List   Diagnosis Date Noted   Chronic anticoagulation 05/11/2020   Acute CVA (cerebrovascular accident) (HCC) 05/08/2020   Hypercholesteremia    Hypertension    Stroke [facility])    History of mitral valve replacement    Leg pain 09/27/2017   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   Venous insufficiency 09/27/2017   GERD (gastroesophageal reflux disease) 09/27/2017   Amour Cutrone B. Dreama Saa M.S., CCC-SLP, [facility] Speech-Language Pathologist Rehabilitation Services Office [phone]  Reuel Derby 06/20/2021, 1:07 PM  [place] [facility] MAIN [facility] SERVICES [address] [place],  [place], [address] Phone: [phone]   Fax:  [phone]   Name: AASIM RESTIVO MRN: [id number] Date of Birth: [birth year]

## 2021-06-24 ENCOUNTER — Ambulatory Visit: Payer: Medicare HMO | Admitting: Speech Pathology

## 2021-06-24 ENCOUNTER — Other Ambulatory Visit: Payer: Self-pay

## 2021-06-24 DIAGNOSIS — R4701 Aphasia: Secondary | ICD-10-CM | POA: Diagnosis not present

## 2021-06-24 DIAGNOSIS — I639 Cerebral infarction, unspecified: Secondary | ICD-10-CM

## 2021-06-25 ENCOUNTER — Encounter: Payer: Self-pay | Admitting: Speech Pathology

## 2021-06-25 NOTE — Therapy (Signed)
[place] [facility] MAIN [facility] SERVICES [address] [place], [place], [address] Phone: [phone]   Fax:  [phone]  Speech Language Pathology Treatment  Patient Details  Name: Terry [name] MRN: [id number] Date of Birth: [birth year] Referring Provider (SLP): Dr Theora Master   Encounter Date: 06/24/2021   End of Session - 06/25/21 2018     Visit Number 5    Number of Visits 25    Date for SLP Re-Evaluation 09/02/21    Authorization Type [employer] Medicare    Authorization Time Period 06/10/2021 thru 09/02/2021    Authorization - Visit Number 5    Progress Note Due on Visit 10    SLP Start Time 0900    SLP Stop Time  1000    SLP Time Calculation (min) 60 min    Activity Tolerance Patient tolerated treatment well             Past Medical History:  Diagnosis Date   Diverticulosis    GERD (gastroesophageal reflux disease)    Heart murmur    Hypercholesteremia    Hypertension    Mitral valve prolapse      H&P Terry [name] is a 70 y.o. male with medical history significant of previous  hypertension, hyperlipidemia, history of mitral valve prolapse s/p mitral valve replacement, GERD. 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 More recent CVA occurred on 04/01/2021 with MRI revealing acute infarct left PCA territory involving the left hippocampus and left thalamus. Probable small acute infarct in the high right parietal lobe. This suggests emboli. Chronic infarct left frontal lobe.       Subjective Assessment - 06/25/21 2012     Subjective pleasant, joking, conversant    Currently in Pain? No/denies                   ADULT SLP TREATMENT - 06/25/21 0001       Cognitive-Linquistic Treatment   Skilled Treatment WORD FINDING: Maximal assistance to describe semi-abstract words according to semantic  features; improved to requiring moderate multi-modal assistance to describe using semantic to achieve 75% accuracy in barrier tasks              SLP Education - 06/25/21 2018     Education Details semantic feature description    Person(s) Educated Patient    Methods Explanation;Demonstration;Verbal cues;Handout    Comprehension Need further instruction              SLP Short Term Goals - 06/12/21 1201       SLP SHORT TERM GOAL #1   Title Pt will utilize speech intelligibility strategies at the paragraph level to achieve > 95% speech intelligibility.    Baseline new goal    Time 10    Period --   sessions   Status New      SLP SHORT TERM GOAL #2   Title Given an abstract concept, pt will provide at least 3 organized cogent statements in 7 out of 10 opportunities.    Baseline new goal    Time 10    Period --   sessions   Status New      SLP SHORT TERM GOAL #3   Title Pt will self-monitor and self- correct errors in speech intelligibility in 7 out of 10 opportunities.    Time 10    Period --  sessions   Status New              SLP Long Term Goals - 06/12/21 1205       SLP LONG TERM GOAL #1   Title Pt will utilize speech intelligibility strategies to achieve > 95% intelligibility at the complex conversation level.    Baseline new goal    Time 12    Period Weeks    Status New    Target Date 09/02/21      SLP LONG TERM GOAL #2   Title Pt will use word finding strategies to abstract and complex information with modified independence.    Baseline new goal    Time 12    Period Weeks    Status New    Target Date 09/02/21              Plan - 06/25/21 2020     Clinical Impression Statement Pt presents with mild higher level word finding deficits as well as occasionally dysfluent speech when conveying complex or abstract information. This appears further complicated by pt's decreased mental flexibility in utilizing compensatory word finding strategies  during moments of anomia. Pt continued offering 1-2-word description based on his knowledge base to describe a given abstract word. In an effort to help pt see efficiency of semantic feature description, SLP student visit session and was not able to guess word being described by pt, but she was able to guess word based on SLP's use of semantic features. Pt voiced understanding and began using semantic features with moderate support. Recommend continued skilled ST intervention to aid pt in becoming proficient in use of word finding strategies.    Speech Therapy Frequency 2x / week    Duration 12 weeks    Treatment/Interventions SLP instruction and feedback;Patient/family education;Compensatory techniques;Language facilitation    Potential to Achieve Goals Good    SLP Home Exercise Plan provided    Consulted and Agree with Plan of Care Patient             Patient will benefit from skilled therapeutic intervention in order to improve the following deficits and impairments:   Aphasia  Acute CVA (cerebrovascular accident) [facility])    Problem List Patient Active Problem List   Diagnosis Date Noted   Chronic anticoagulation 05/11/2020   Acute CVA (cerebrovascular accident) (HCC) 05/08/2020   Hypercholesteremia    Hypertension    Stroke [facility])    History of mitral valve replacement    Leg pain 09/27/2017   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   Venous insufficiency 09/27/2017   GERD (gastroesophageal reflux disease) 09/27/2017   Carson Bogden B. Dreama Saa M.S., CCC-SLP, [facility] Speech-Language Pathologist Rehabilitation Services Office [phone]  Terry [name] 06/25/2021, 8:26 PM  [place] [facility] MAIN [facility] SERVICES [address] [place], [place], [address] Phone: [phone]   Fax:  [phone]   Name: Terry [name] MRN: [id number] Date of Birth: Apr 02, 1951

## 2021-06-26 ENCOUNTER — Other Ambulatory Visit: Payer: Self-pay

## 2021-06-26 ENCOUNTER — Ambulatory Visit: Payer: Medicare HMO | Admitting: Speech Pathology

## 2021-06-26 DIAGNOSIS — R4701 Aphasia: Secondary | ICD-10-CM

## 2021-06-26 DIAGNOSIS — I639 Cerebral infarction, unspecified: Secondary | ICD-10-CM

## 2021-06-26 NOTE — Patient Instructions (Signed)
Using semantic feature analysis, read down description of 5 preselected words (unknown to SLP) to describe to SLP on next visit

## 2021-06-26 NOTE — Therapy (Signed)
[facility] MAIN [facility] SERVICES [address] [place], [place], [address] Phone: [phone]   Fax:  [phone]  Speech Language Pathology Treatment  Patient Details  Name: Terry [name] MRN: [id number] Date of Birth: Feb 09, 1951 Referring Provider (SLP): Dr Theora Master   Encounter Date: 06/26/2021   End of Session - 06/26/21 1353     Visit Number 6    Number of Visits 25    Date for SLP Re-Evaluation 09/02/21    Authorization Type [employer]    Authorization Time Period 06/10/2021 thru 09/02/2021    Authorization - Visit Number 6    Progress Note Due on Visit 10    SLP Start Time 0900    SLP Stop Time  1000    SLP Time Calculation (min) 60 min    Activity Tolerance Patient tolerated treatment well             Past Medical History:  Diagnosis Date   Diverticulosis    GERD (gastroesophageal reflux disease)    Heart murmur    Hypercholesteremia    Hypertension    Mitral valve prolapse     Past Surgical History:  Procedure Laterality Date   COLONOSCOPY WITH PROPOFOL N/A 03/06/2020   Procedure: COLONOSCOPY WITH PROPOFOL;  Surgeon: Toledo, Boykin Nearing, MD;  Location: [facility] ENDOSCOPY;  Service: Gastroenterology;  Laterality: N/A;   MITRAL VALVE REPLACEMENT     RETINAL DETACHMENT SURGERY     RIGHT/LEFT HEART CATH AND CORONARY ANGIOGRAPHY Bilateral 02/18/2017   Procedure: Right/Left Heart Cath and Coronary Angiography;  Surgeon: Marcina Millard, MD;  Location: [facility] INVASIVE CV LAB;  Service: Cardiovascular;  Laterality: Bilateral;   RIGHT/LEFT HEART CATH AND CORONARY ANGIOGRAPHY N/A 02/22/2020   Procedure: RIGHT/LEFT HEART CATH AND CORONARY ANGIOGRAPHY;  Surgeon: Marcina Millard, MD;  Location: [facility] INVASIVE CV LAB;  Service: Cardiovascular;  Laterality: N/A;   TEMPOROMANDIBULAR JOINT SURGERY      There were no vitals filed for this visit.   Subjective Assessment - 06/26/21 1351     Subjective "Sometimes I can't get  the words out because I am flustered, sometimes if I go outside and walk around, I am good"    Currently in Pain? No/denies                   ADULT SLP TREATMENT - 06/26/21 0001       Cognitive-Linquistic Treatment   Skilled Treatment LANGUAGE ORGANIZATION: Semantic Feature Analysis gird provided to help with effective descriptions of abstract words; after example provided in conjunction with SLP; pt required moderate assistance to generate appropriate responses to GROUP, USE, ACTION, PROPERTIES, LOCATION and ASSOCIATION - for example - pt described CHART as Environmental consultant"              SLP Education - 06/26/21 1352     Education Details semantic feature analysis    Person(s) Educated Patient    Methods Explanation;Demonstration;Verbal cues;Handout    Comprehension Verbalized understanding;Need further instruction              SLP Short Term Goals - 06/12/21 1201       SLP SHORT TERM GOAL #1   Title Pt will utilize speech intelligibility strategies at the paragraph level to achieve > 95% speech intelligibility.    Baseline new goal    Time 10    Period --   sessions   Status New      SLP SHORT TERM GOAL #2   Title Given  an abstract concept, pt will provide at least 3 organized cogent statements in 7 out of 10 opportunities.    Baseline new goal    Time 10    Period --   sessions   Status New      SLP SHORT TERM GOAL #3   Title Pt will self-monitor and self- correct errors in speech intelligibility in 7 out of 10 opportunities.    Time 10    Period --   sessions   Status New              SLP Long Term Goals - 06/12/21 1205       SLP LONG TERM GOAL #1   Title Pt will utilize speech intelligibility strategies to achieve > 95% intelligibility at the complex conversation level.    Baseline new goal    Time 12    Period Weeks    Status New    Target Date 09/02/21      SLP LONG TERM GOAL #2   Title Pt will use word finding strategies to  abstract and complex information with modified independence.    Baseline new goal    Time 12    Period Weeks    Status New    Target Date 09/02/21              Plan - 06/26/21 1353     Clinical Impression Statement Pt presents with mild higher level word finding deficits as well as occasionally dysfluent speech when conveying complex or abstract information. Pt can be difficult to keep on task d/t talking after each word presented as he has an interesting fact or personal story to tell about the word. Pt's expressive abilities during conversation and story-telling don't appear impacted by any expressive difficulties. Pt is perseverative on his relationship with his wife, frustration with their communication style as well as various marital issues. He reports trying to reduce his frustration level and "just keep the peace." It is unclear if pt's expressive difficulties when communicating with her are related to frustration level or deficits from acute stroke. As such, pt requires skilled ST intervention to developing word finding and speech intelligibility strategies to effectively communication complex information thus increasing his functional independence.    Potential Considerations Other (comment)   strained relationship with wife   SLP Home Exercise Plan provided    Consulted and Agree with Plan of Care Patient             Patient will benefit from skilled therapeutic intervention in order to improve the following deficits and impairments:   Aphasia  Acute CVA (cerebrovascular accident) [facility])    Problem List Patient Active Problem List   Diagnosis Date Noted   Chronic anticoagulation 05/11/2020   Acute CVA (cerebrovascular accident) (HCC) 05/08/2020   Hypercholesteremia    Hypertension    Stroke [facility])    History of mitral valve replacement    Leg pain 09/27/2017   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   Venous insufficiency 09/27/2017   GERD (gastroesophageal reflux  disease) 09/27/2017   Anwyn Kriegel B. Dreama Saa M.S., CCC-SLP, [facility] Speech-Language Pathologist Rehabilitation Services Office [phone]  Reuel Derby 06/26/2021, 1:55 PM  [place] [facility] MAIN [facility] SERVICES [address] [place], [place], [address] Phone: [phone]   Fax:  [phone]   Name: JAZON JIPSON MRN: [id number] Date of Birth: April 25, 1951

## 2021-07-02 ENCOUNTER — Other Ambulatory Visit: Payer: Self-pay

## 2021-07-02 ENCOUNTER — Ambulatory Visit: Payer: Medicare HMO | Attending: Neurology | Admitting: Speech Pathology

## 2021-07-02 DIAGNOSIS — I639 Cerebral infarction, unspecified: Secondary | ICD-10-CM | POA: Insufficient documentation

## 2021-07-02 DIAGNOSIS — R4701 Aphasia: Secondary | ICD-10-CM | POA: Diagnosis not present

## 2021-07-03 NOTE — Therapy (Signed)
[place] [facility] MAIN [facility] SERVICES [address] [place], [place], [address] Phone: [phone]   Fax:  [phone]  Speech Language Pathology Treatment  Patient Details  Name: Terry [name] MRN: [id number] Date of Birth: [birth year] Referring Provider (SLP): Dr Theora Master   Encounter Date: 07/02/2021   End of Session - 07/03/21 0934     Visit Number 7    Number of Visits 25    Date for SLP Re-Evaluation 09/02/21    Authorization Type [employer] Medicare    Authorization Time Period 06/10/2021 thru 09/02/2021    Authorization - Visit Number 7    Progress Note Due on Visit 10    SLP Start Time 1000    SLP Stop Time  1100    SLP Time Calculation (min) 60 min    Activity Tolerance Patient tolerated treatment well   frequently has alternate reasons/excuses            Past Medical History:  Diagnosis Date   Diverticulosis    GERD (gastroesophageal reflux disease)    Heart murmur    Hypercholesteremia    Hypertension    Mitral valve prolapse     Past Surgical History:  Procedure Laterality Date   COLONOSCOPY WITH PROPOFOL N/A 03/06/2020   Procedure: COLONOSCOPY WITH PROPOFOL;  Surgeon: Toledo, Boykin Nearing, MD;  Location: [facility] ENDOSCOPY;  Service: Gastroenterology;  Laterality: N/A;   MITRAL VALVE REPLACEMENT     RETINAL DETACHMENT SURGERY     RIGHT/LEFT HEART CATH AND CORONARY ANGIOGRAPHY Bilateral 02/18/2017   Procedure: Right/Left Heart Cath and Coronary Angiography;  Surgeon: Marcina Millard, MD;  Location: [facility] INVASIVE CV LAB;  Service: Cardiovascular;  Laterality: Bilateral;   RIGHT/LEFT HEART CATH AND CORONARY ANGIOGRAPHY N/A 02/22/2020   Procedure: RIGHT/LEFT HEART CATH AND CORONARY ANGIOGRAPHY;  Surgeon: Marcina Millard, MD;  Location: [facility] INVASIVE CV LAB;  Service: Cardiovascular;  Laterality: N/A;   TEMPOROMANDIBULAR JOINT SURGERY      There were no vitals filed for this visit.   Subjective Assessment - 07/03/21 0933      Subjective "I recorded my homework, since this is speech, I thought you could listen to me"    Currently in Pain? No/denies                   ADULT SLP TREATMENT - 07/03/21 0001       Cognitive-Linquistic Treatment   Skilled Treatment LANGUAGE ORGANIZATION: Pt brought in recording of himself describing unknown words using semantic feature analysis - [place], brew, fine, cry and floppy - of these words, one description resulted in the corrected guess of targeted word; specifically pt's descriptions displayed linear descriptions that lacked specificity              SLP Education - 07/03/21 0933     Education Details contrast between quote that pt refers to and reality of current ability    Person(s) Educated Patient    Methods Explanation;Demonstration;Verbal cues;Handout    Comprehension Need further instruction              SLP Short Term Goals - 06/12/21 1201       SLP SHORT TERM GOAL #1   Title Pt will utilize speech intelligibility strategies at the paragraph level to achieve > 95% speech intelligibility.    Baseline new goal    Time 10    Period --   sessions   Status New      SLP SHORT TERM GOAL #2  Title Given an abstract concept, pt will provide at least 3 organized cogent statements in 7 out of 10 opportunities.    Baseline new goal    Time 10    Period --   sessions   Status New      SLP SHORT TERM GOAL #3   Title Pt will self-monitor and self- correct errors in speech intelligibility in 7 out of 10 opportunities.    Time 10    Period --   sessions   Status New              SLP Long Term Goals - 06/12/21 1205       SLP LONG TERM GOAL #1   Title Pt will utilize speech intelligibility strategies to achieve > 95% intelligibility at the complex conversation level.    Baseline new goal    Time 12    Period Weeks    Status New    Target Date 09/02/21      SLP LONG TERM GOAL #2   Title Pt will use word finding strategies  to abstract and complex information with modified independence.    Baseline new goal    Time 12    Period Weeks    Status New    Target Date 09/02/21              Plan - 07/03/21 0935     Clinical Impression Statement Pt reports mild higher level word finding deficits during social conversations. These deficits have not be witnessed in therapy sessions but pt expresses great frustration as well as decreased desire to engage in social conversation. Pt has been struggling with Semantic Feature Analysis. Specifically, pt continually refers to his "life quote" by Olen Cordial; "you never complicate a sentence by using words people don't understand." Pt further explains that "we get into a pattern of not knowing who you are talking too." Despite extensive education, pt continues to refer to this quote. That coupled with pt's inability to inhibit talking excessively create a guarded prognosis for use of word finding strategies. Continue to recommend skilled ST for an additional week of therapy, if progress is not made, then discharge will be considered.    Speech Therapy Frequency 2x / week    Duration 12 weeks    Treatment/Interventions SLP instruction and feedback;Patient/family education;Compensatory techniques;Language facilitation    Potential to Achieve Goals Fair    Potential Considerations Other (comment)   see impression statement   Consulted and Agree with Plan of Care Patient             Patient will benefit from skilled therapeutic intervention in order to improve the following deficits and impairments:   Aphasia  Acute CVA (cerebrovascular accident) [facility])    Problem List Patient Active Problem List   Diagnosis Date Noted   Chronic anticoagulation 05/11/2020   Acute CVA (cerebrovascular accident) (HCC) 05/08/2020   Hypercholesteremia    Hypertension    Stroke [facility])    History of mitral valve replacement    Leg pain 09/27/2017   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    Venous insufficiency 09/27/2017   GERD (gastroesophageal reflux disease) 09/27/2017   Victory Dresden B. Dreama Saa M.S., CCC-SLP, [facility] Speech-Language Pathologist Rehabilitation Services Office [phone]  Reuel Derby 07/03/2021, 9:36 AM  [place] [facility] MAIN [facility] SERVICES [address] [place], [place], [address] Phone: [phone]   Fax:  [phone]   Name: CARLESS SLATTEN MRN: [id number] Date of Birth: [birth year]

## 2021-07-04 ENCOUNTER — Other Ambulatory Visit: Payer: Self-pay

## 2021-07-04 ENCOUNTER — Ambulatory Visit: Payer: Medicare HMO | Admitting: Speech Pathology

## 2021-07-04 DIAGNOSIS — R4701 Aphasia: Secondary | ICD-10-CM

## 2021-07-04 DIAGNOSIS — I639 Cerebral infarction, unspecified: Secondary | ICD-10-CM

## 2021-07-04 NOTE — Therapy (Signed)
[place] [facility] MAIN [facility] SERVICES [address] [place], [place], [address] Phone: [phone]   Fax:  [phone]  Speech Language Pathology Treatment  Patient Details  Name: Terry [name] MRN: [id number] Date of Birth: 1951/11/04 Referring Provider (SLP): Dr Theora Master   Encounter Date: 07/04/2021   End of Session - 07/04/21 1250     Visit Number 8    Number of Visits 25    Date for SLP Re-Evaluation 09/02/21    Authorization Type [employer] Medicare    Authorization Time Period 06/10/2021 thru 09/02/2021    Authorization - Visit Number 8    Progress Note Due on Visit 10    SLP Start Time 0900    SLP Stop Time  1000    SLP Time Calculation (min) 60 min    Activity Tolerance Patient tolerated treatment well             Past Medical History:  Diagnosis Date   Diverticulosis    GERD (gastroesophageal reflux disease)    Heart murmur    Hypercholesteremia    Hypertension    Mitral valve prolapse     Past Surgical History:  Procedure Laterality Date   COLONOSCOPY WITH PROPOFOL N/A 03/06/2020   Procedure: COLONOSCOPY WITH PROPOFOL;  Surgeon: Toledo, Boykin Nearing, MD;  Location: [facility] ENDOSCOPY;  Service: Gastroenterology;  Laterality: N/A;   MITRAL VALVE REPLACEMENT     RETINAL DETACHMENT SURGERY     RIGHT/LEFT HEART CATH AND CORONARY ANGIOGRAPHY Bilateral 02/18/2017   Procedure: Right/Left Heart Cath and Coronary Angiography;  Surgeon: Marcina Millard, MD;  Location: [facility] INVASIVE CV LAB;  Service: Cardiovascular;  Laterality: Bilateral;   RIGHT/LEFT HEART CATH AND CORONARY ANGIOGRAPHY N/A 02/22/2020   Procedure: RIGHT/LEFT HEART CATH AND CORONARY ANGIOGRAPHY;  Surgeon: Marcina Millard, MD;  Location: [facility] INVASIVE CV LAB;  Service: Cardiovascular;  Laterality: N/A;   TEMPOROMANDIBULAR JOINT SURGERY      There were no vitals filed for this visit.   Subjective Assessment - 07/04/21 1249     Subjective "I have been thinking  about what you said in therapy on Wednesday"    Currently in Pain? No/denies                   ADULT SLP TREATMENT - 07/04/21 0001       Treatment Provided   Treatment provided Cognitive-Linquistic      Cognitive-Linquistic Treatment   Treatment focused on Aphasia;Patient/family/caregiver education    Skilled Treatment LANGUAGE ORGANIZATION: When provided with a functional scenario of word finding difficulty, pt able to describe basic object with > 90% accuracy - great improvement              SLP Education - 07/04/21 1250     Education Details word finding strategies    Person(s) Educated Patient    Methods Explanation;Demonstration;Verbal cues    Comprehension Verbalized understanding;Returned demonstration;Need further instruction              SLP Short Term Goals - 06/12/21 1201       SLP SHORT TERM GOAL #1   Title Pt will utilize speech intelligibility strategies at the paragraph level to achieve > 95% speech intelligibility.    Baseline new goal    Time 10    Period --   sessions   Status New      SLP SHORT TERM GOAL #2   Title Given an abstract concept, pt will provide at least 3 organized cogent statements  in 7 out of 10 opportunities.    Baseline new goal    Time 10    Period --   sessions   Status New      SLP SHORT TERM GOAL #3   Title Pt will self-monitor and self- correct errors in speech intelligibility in 7 out of 10 opportunities.    Time 10    Period --   sessions   Status New              SLP Long Term Goals - 06/12/21 1205       SLP LONG TERM GOAL #1   Title Pt will utilize speech intelligibility strategies to achieve > 95% intelligibility at the complex conversation level.    Baseline new goal    Time 12    Period Weeks    Status New    Target Date 09/02/21      SLP LONG TERM GOAL #2   Title Pt will use word finding strategies to abstract and complex information with modified independence.    Baseline new goal     Time 12    Period Weeks    Status New    Target Date 09/02/21              Plan - 07/04/21 1251     Clinical Impression Statement Pt reports mild higher level word finding deficits during social conversations. These deficits have not be witnessed in therapy sessions but pt expresses great frustration as well as decreased desire to engage in social conversation. Pt has been struggling with Semantic Feature Analysis. Specifically, pt continually refers to his "life quote" by Olen Cordial; "you never complicate a sentence by using words people don't understand." Pt further explains that "we get into a pattern of not knowing who you are talking too." Despite extensive education, pt continues to refer to this quote. That coupled with pt's inability to inhibit talking excessively create a guarded prognosis for use of word finding strategies.    Speech Therapy Frequency 2x / week    Duration 12 weeks    Treatment/Interventions SLP instruction and feedback;Patient/family education;Compensatory techniques;Language facilitation    Potential to Achieve Goals Good    SLP Home Exercise Plan provided    Consulted and Agree with Plan of Care Patient             Patient will benefit from skilled therapeutic intervention in order to improve the following deficits and impairments:   Aphasia  Acute CVA (cerebrovascular accident) [facility])    Problem List Patient Active Problem List   Diagnosis Date Noted   Chronic anticoagulation 05/11/2020   Acute CVA (cerebrovascular accident) (HCC) 05/08/2020   Hypercholesteremia    Hypertension    Stroke [facility])    History of mitral valve replacement    Leg pain 09/27/2017   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   Venous insufficiency 09/27/2017   GERD (gastroesophageal reflux disease) 09/27/2017   Rakiyah Esch B. Dreama Saa M.S., CCC-SLP, [facility] Speech-Language Pathologist Rehabilitation Services Office [phone]  Reuel Derby 07/04/2021, 12:51 PM  [facility] MAIN [facility] SERVICES [address] [place], [place], [address] Phone: [phone]   Fax:  [phone]   Name: Terry [name] MRN: [id number] Date of Birth: 1951-11-11

## 2021-07-07 ENCOUNTER — Ambulatory Visit: Payer: Medicare HMO | Admitting: Speech Pathology

## 2021-07-09 ENCOUNTER — Encounter: Payer: Medicare HMO | Admitting: Speech Pathology

## 2021-07-14 ENCOUNTER — Other Ambulatory Visit: Payer: Self-pay

## 2021-07-14 ENCOUNTER — Ambulatory Visit: Payer: Medicare HMO | Admitting: Speech Pathology

## 2021-07-14 DIAGNOSIS — R4701 Aphasia: Secondary | ICD-10-CM | POA: Diagnosis not present

## 2021-07-14 DIAGNOSIS — I639 Cerebral infarction, unspecified: Secondary | ICD-10-CM

## 2021-07-14 NOTE — Patient Instructions (Signed)
Continue using semantic feature analysis, manage frustration level

## 2021-07-14 NOTE — Therapy (Signed)
[place] MAIN [facility] SERVICES [address] [place], [place], [address] Phone: [phone]   Fax:  [phone]  Speech Language Pathology Treatment DISCHARGE SUMMARY  Patient Details  Name: Terry [name] MRN: [id number] Date of Birth: 05-18-1951 Referring Provider (SLP): Dr Gurney Maxin   Encounter Date: 07/14/2021   End of Session - 07/14/21 1020     Visit Number 9    Number of Visits 25    Date for SLP Re-Evaluation 09/02/21    Authorization Type [employer]    Authorization Time Period 06/10/2021 thru 09/02/2021    Authorization - Visit Number 9    Progress Note Due on Visit 10    SLP Start Time 1000    SLP Stop Time  1100    SLP Time Calculation (min) 60 min    Activity Tolerance Patient tolerated treatment well             Past Medical History:  Diagnosis Date   Diverticulosis    GERD (gastroesophageal reflux disease)    Heart murmur    Hypercholesteremia    Hypertension    Mitral valve prolapse     Past Surgical History:  Procedure Laterality Date   COLONOSCOPY WITH PROPOFOL N/A 03/06/2020   Procedure: COLONOSCOPY WITH PROPOFOL;  Surgeon: Toledo, Benay Pike, MD;  Location: [facility] ENDOSCOPY;  Service: Gastroenterology;  Laterality: N/A;   MITRAL VALVE REPLACEMENT     RETINAL DETACHMENT SURGERY     RIGHT/LEFT HEART CATH AND CORONARY ANGIOGRAPHY Bilateral 02/18/2017   Procedure: Right/Left Heart Cath and Coronary Angiography;  Surgeon: Isaias Cowman, MD;  Location: [place] [facility];  Service: Cardiovascular;  Laterality: Bilateral;   RIGHT/LEFT HEART CATH AND CORONARY ANGIOGRAPHY N/A 02/22/2020   Procedure: RIGHT/LEFT HEART CATH AND CORONARY ANGIOGRAPHY;  Surgeon: Isaias Cowman, MD;  Location: [place] [facility] LAB;  Service: Cardiovascular;  Laterality: N/A;   TEMPOROMANDIBULAR JOINT SURGERY      There were no vitals filed for this visit.   Subjective Assessment - 07/14/21 1018     Subjective "I am  really out of breath, I have a doctor's appt tomorrow"    Currently in Pain? No/denies                   ADULT SLP TREATMENT - 07/14/21 0001       Treatment Provided   Treatment provided Cognitive-Linquistic      Cognitive-Linquistic Treatment   Treatment focused on Aphasia;Patient/family/caregiver education    Skilled Treatment LANGUAGE ORGANIZATION: Pt Mod I with semantic feature analysis, able to use in functional scenerios with Mod I              SLP Education - 07/14/21 1020     Education Details progress towards goals    Person(s) Educated Patient    Methods Explanation;Demonstration;Verbal cues    Comprehension Verbalized understanding;Returned demonstration              SLP Short Term Goals - 07/14/21 1026       SLP SHORT TERM GOAL #1   Title Pt will utilize speech intelligibility strategies at the paragraph level to achieve > 95% speech intelligibility.    Status Achieved      SLP SHORT TERM GOAL #2   Title Given an abstract concept, pt will provide at least 3 organized cogent statements in 7 out of 10 opportunities.    Status Achieved      SLP SHORT TERM GOAL #3   Title Pt  will self-monitor and self- correct errors in speech intelligibility in 7 out of 10 opportunities.    Status Achieved              SLP Long Term Goals - 07/14/21 1026       SLP LONG TERM GOAL #1   Title Pt will utilize speech intelligibility strategies to achieve > 95% intelligibility at the complex conversation level.    Status Achieved      SLP LONG TERM GOAL #2   Title Pt will use word finding strategies to abstract and complex information with modified independence.    Status Achieved      SLP LONG TERM GOAL #3   Title Patient will generate grammatical, fluent, and cogent sentences to complete abstract/complex linguistic task with 80% accuracy.    Status Achieved              Plan - 07/14/21 1021     Clinical Impression Statement Pt reports that he  is making progress with his frustration level as well as increased use of word finding strategies during social situations, including stressful situation.  He also reports that he is focused on using a slower rate to increase his speech intelligibility. He also reports that he has been recording himself talking and playing it back to keep his speech calibrated. At this time, pt requests discharge from [facility] as he has met all STG and LTGs. All education has been completed.    SLP Home Exercise Plan provided    Consulted and Agree with Plan of Care Patient             Patient will benefit from skilled therapeutic intervention in order to improve the following deficits and impairments:   Aphasia  Acute CVA (cerebrovascular accident) [facility])    Problem List Patient Active Problem List   Diagnosis Date Noted   Chronic anticoagulation 05/11/2020   Acute CVA (cerebrovascular accident) ([place]) 05/08/2020   Hypercholesteremia    Hypertension    Stroke [facility])    History of mitral valve replacement    Leg pain 09/27/2017   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   Venous insufficiency 09/27/2017   GERD (gastroesophageal reflux disease) 09/27/2017   Darran Gabay B. Rutherford Nail M.S., CCC-SLP, [place] Pathologist Rehabilitation Services Office [phone]  Stormy Fabian 07/14/2021, 10:36 AM  [place] MAIN [facility] SERVICES [address] [place], [place], [address] Phone: [phone]   Fax:  [phone]   Name: Terry [name] MRN: [id number] Date of Birth: 07-15-1951

## 2021-07-16 ENCOUNTER — Ambulatory Visit: Payer: Medicare HMO | Admitting: Speech Pathology

## 2021-07-17 IMAGING — MR MR HEAD W/O CM
11 series · 44 of 48 positions shown · non-contrast
Comparison: CT head 05/07/2020

CLINICAL DATA: Stroke. History of mitral valve replacement
04/12/2020

EXAM:
MRI HEAD WITHOUT CONTRAST
TECHNIQUE: Multiplanar, multiecho pulse sequences of the brain and surrounding
structures were obtained without intravenous contrast.

[Series 5: ax dwi_tracew · axial · 3.0mm · 0.60mm/px · z∈[-98,+63]mm · 4 of 50 slices shown]
[im 1/50]
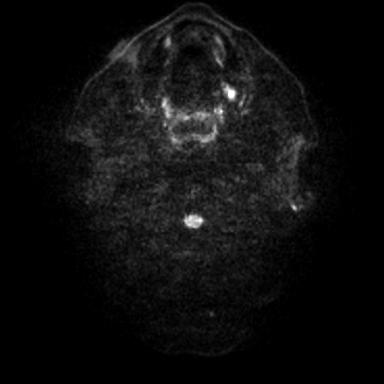
[im 17/50]
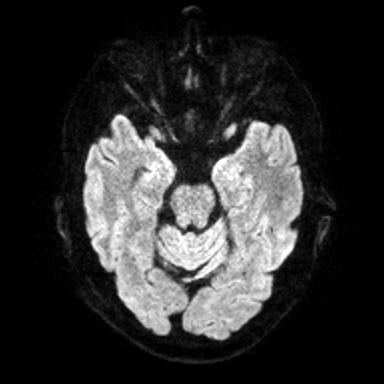
[im 33/50]
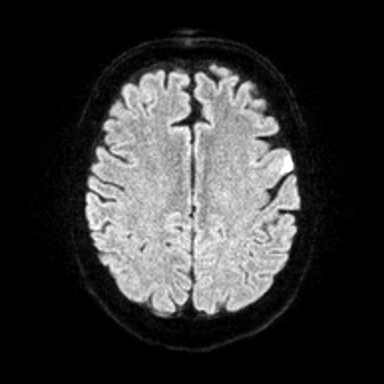
[im 50/50]
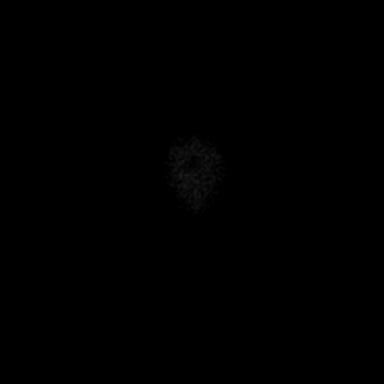

[Series 6: ax dwi_adc · axial · 3.0mm · 0.60mm/px · z∈[-98,+57]mm · 4 of 48 slices shown]
[im 1/48]
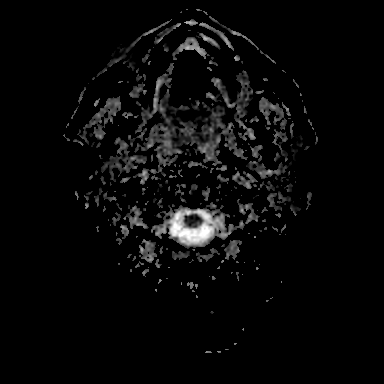
[im 16/48]
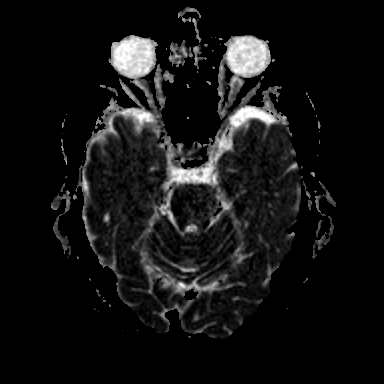
[im 32/48]
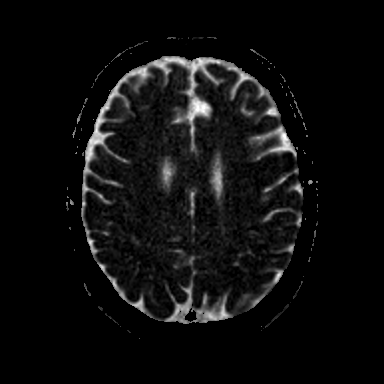
[im 48/48]
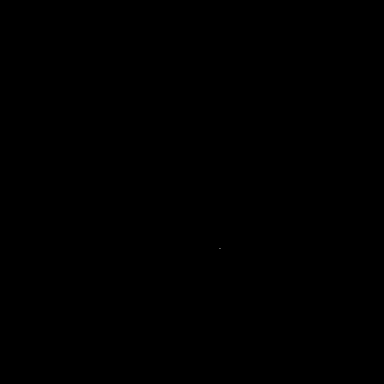

[Series 7: cor dwi_tracew · coronal · 5.0mm · 0.60mm/px · 3 of 40 slices shown]
[im 1/40]
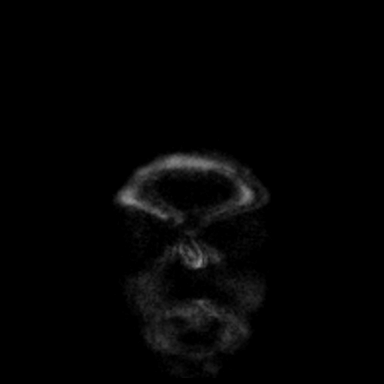
[im 20/40]
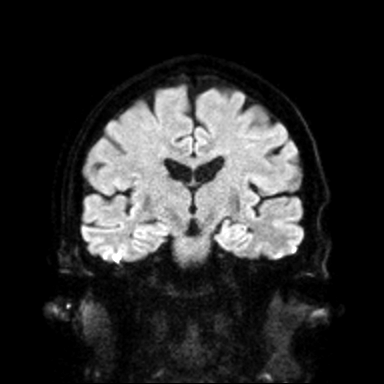
[im 40/40]
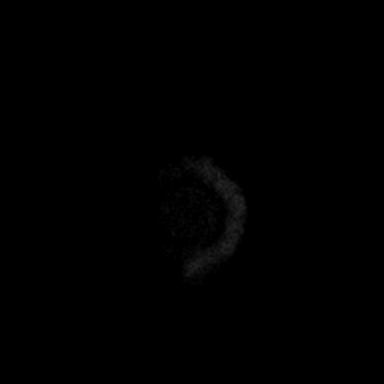

[Series 8: cor dwi_adc · coronal · 5.0mm · 0.60mm/px · 3 of 38 slices shown]
[im 1/38]
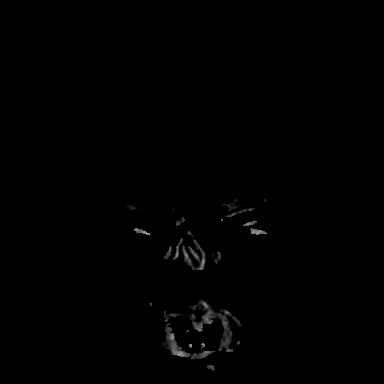
[im 19/38]
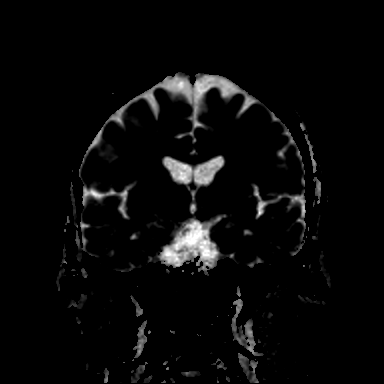
[im 38/38]
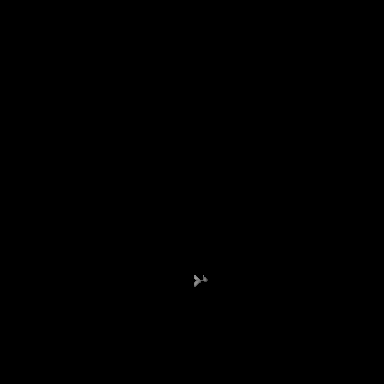

[Series 9: T1 · sagittal · 5.0mm · 0.62mm/px · 2 of 24 slices shown (1 of 2)]
[im 1/24]
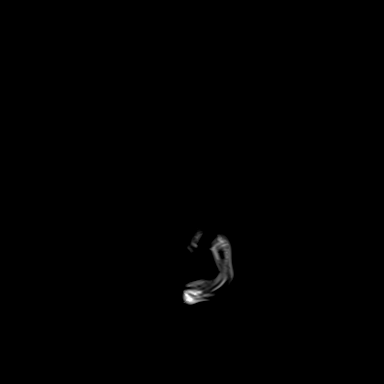
[im 24/24]
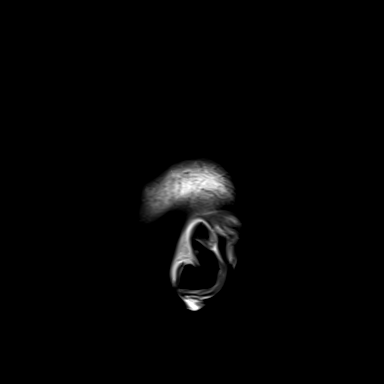

[Series 10: T2 · axial · 5.0mm · 0.53mm/px · z∈[-95,+61]mm · 2 of 27 slices shown (1 of 2)]
[im 1/27]
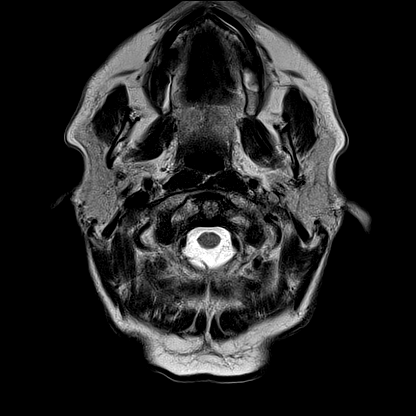
[im 27/27]
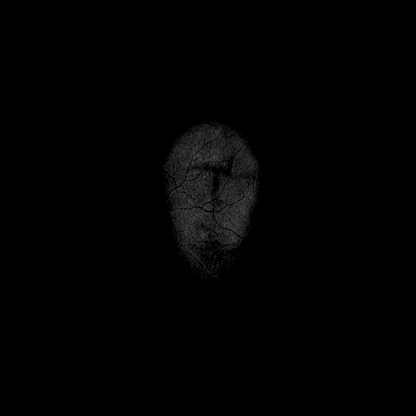

[Series 12: pha_images · axial · 3.0mm · 0.90mm/px · z∈[-105,+68]mm · 5 of 59 slices shown]
[im 1/59]
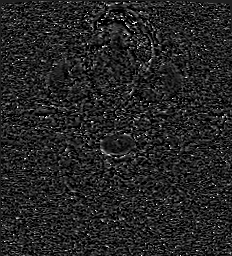
[im 15/59]
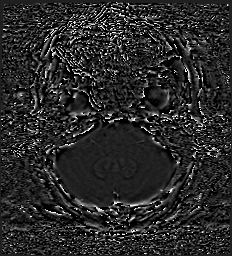
[im 30/59]
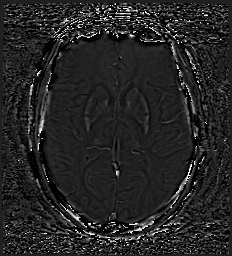
[im 44/59]
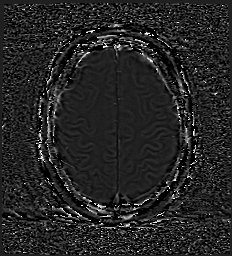
[im 59/59]
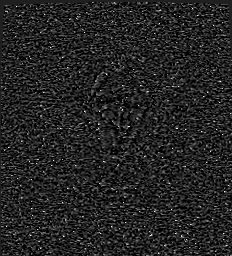

[Series 13: swi_images · axial · 3.0mm · 0.90mm/px · z∈[-105,+71]mm · 5 of 60 slices shown]
[im 1/60]
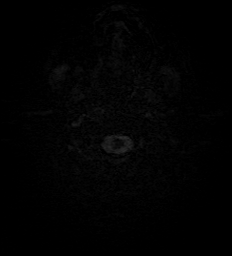
[im 15/60]
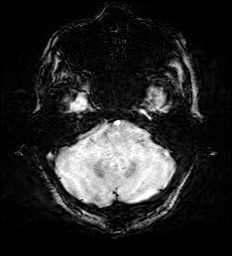
[im 30/60]
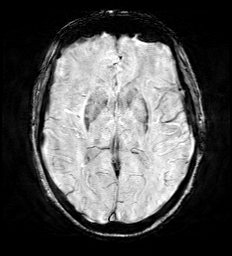
[im 45/60]
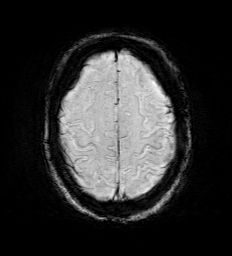
[im 60/60]
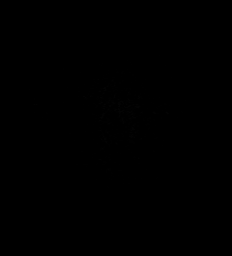

[Series 15: FLAIR · axial · 3.0mm · 0.53mm/px · z∈[-98,+64]mm · 4 of 55 slices shown]
[im 1/55]
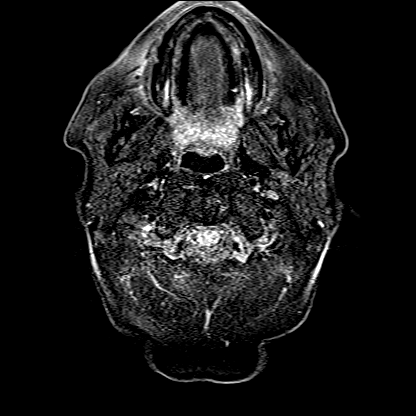
[im 19/55]
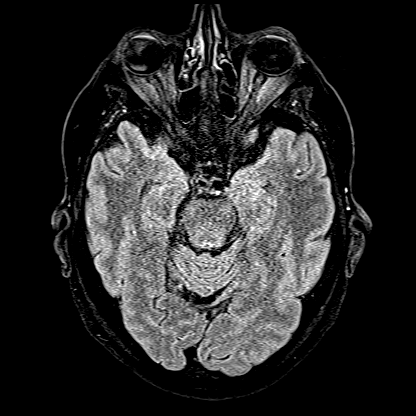
[im 37/55]
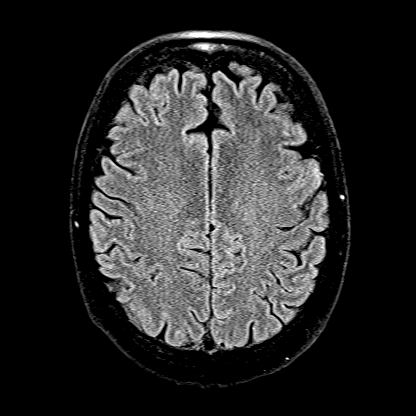
[im 55/55]
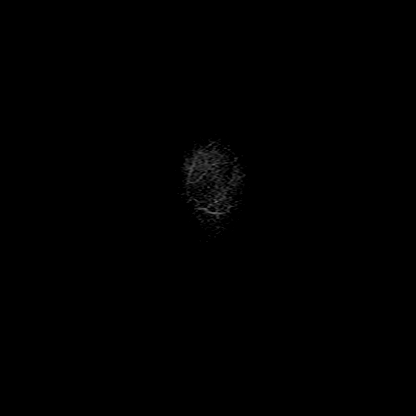

[Series 16: T1 · axial · 1.0mm · 0.98mm/px · z∈[-105,+69]mm · 10 of 176 slices shown (2 of 2)]
[im 1/176]
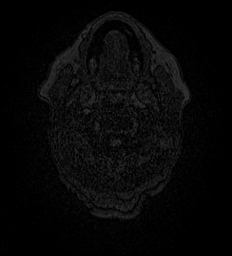
[im 14/176]
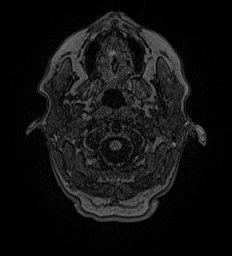
[im 27/176]
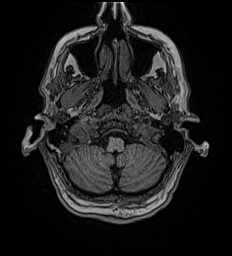
[im 41/176]
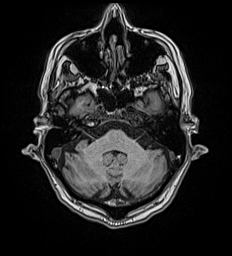
[im 54/176]
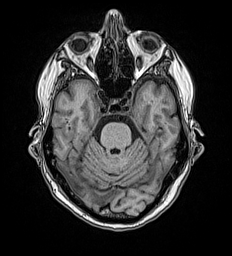
[im 81/176]
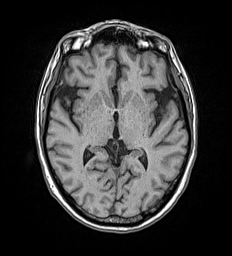
[im 95/176]
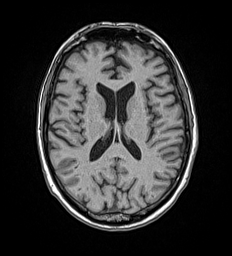
[im 122/176]
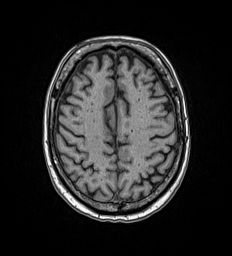
[im 149/176]
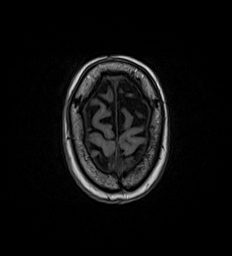
[im 176/176]
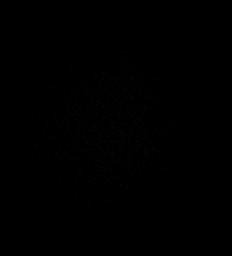

[Series 17: T2 · coronal · 5.0mm · 0.57mm/px · 2 of 30 slices shown (2 of 2)]
[im 1/30]
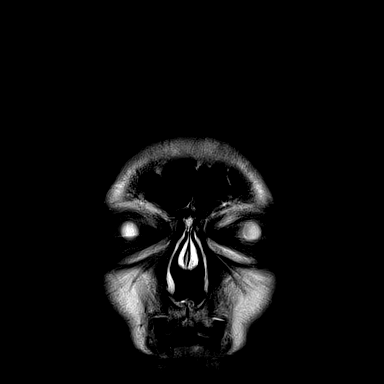
[im 30/30]
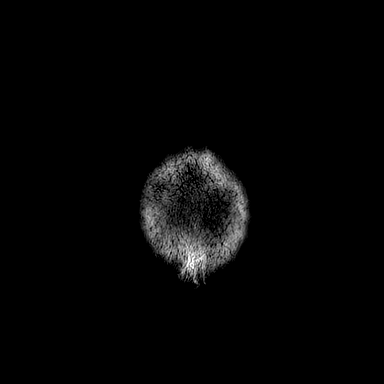

[44 of 48 positions shown; findings below may reference images not displayed]

FINDINGS: Brain: Multiple areas of restricted diffusion compatible with acute
infarct. Small acute infarcts in the cerebellum bilaterally. Small
acute cortical infarcts in the right parietal lobe and in the right
parietal white matter. Acute infarct in the left operculum and
insula. Negative for hemorrhage or mass. Ventricle size and cerebral
volume normal for age.

Vascular: Normal arterial flow voids

Skull and upper cervical spine: No focal skeletal lesion.

Sinuses/Orbits: Mild mucosal edema paranasal sinuses. Right cataract
extraction. No orbital lesion

Other: None
IMPRESSION: Multiple areas of acute infarct including both cerebral hemispheres
and in the cerebellum bilaterally. Findings compatible with emboli.
No hemorrhage or mass.

## 2021-07-21 ENCOUNTER — Ambulatory Visit: Payer: Medicare HMO | Admitting: Speech Pathology

## 2021-07-23 ENCOUNTER — Encounter: Payer: Medicare HMO | Admitting: Speech Pathology

## 2021-07-28 ENCOUNTER — Encounter: Payer: Medicare HMO | Admitting: Speech Pathology

## 2021-07-30 ENCOUNTER — Encounter: Payer: Medicare HMO | Admitting: Speech Pathology

## 2021-08-06 ENCOUNTER — Encounter: Payer: Medicare HMO | Admitting: Speech Pathology

## 2021-08-11 ENCOUNTER — Encounter: Payer: Medicare HMO | Admitting: Speech Pathology

## 2021-08-13 ENCOUNTER — Encounter: Payer: Medicare HMO | Admitting: Speech Pathology

## 2021-08-18 ENCOUNTER — Encounter: Payer: Medicare HMO | Admitting: Speech Pathology

## 2021-08-20 ENCOUNTER — Encounter: Payer: Medicare HMO | Admitting: Speech Pathology

## 2021-08-25 ENCOUNTER — Encounter: Payer: Medicare HMO | Admitting: Speech Pathology

## 2021-08-27 ENCOUNTER — Encounter: Payer: Medicare HMO | Admitting: Speech Pathology

## 2022-10-20 ENCOUNTER — Other Ambulatory Visit: Payer: Self-pay | Admitting: Gastroenterology

## 2022-10-20 DIAGNOSIS — K219 Gastro-esophageal reflux disease without esophagitis: Secondary | ICD-10-CM

## 2022-10-26 ENCOUNTER — Ambulatory Visit
Admission: RE | Admit: 2022-10-26 | Discharge: 2022-10-26 | Disposition: A | Discharge status: Home / Self Care | Payer: Medicare HMO | Source: Ambulatory Visit | Attending: Gastroenterology | Admitting: Gastroenterology

## 2022-10-26 DIAGNOSIS — K219 Gastro-esophageal reflux disease without esophagitis: Secondary | ICD-10-CM | POA: Insufficient documentation

## 2023-01-25 ENCOUNTER — Ambulatory Visit: Admit: 2023-01-25 | Payer: Medicare HMO

## 2023-01-25 SURGERY — ESOPHAGOGASTRODUODENOSCOPY (EGD) WITH PROPOFOL
Anesthesia: General

## 2023-10-01 ENCOUNTER — Other Ambulatory Visit: Payer: Self-pay | Admitting: Nephrology

## 2023-10-01 DIAGNOSIS — N1831 Chronic kidney disease, stage 3a: Secondary | ICD-10-CM

## 2023-10-05 ENCOUNTER — Ambulatory Visit
Admission: RE | Admit: 2023-10-05 | Discharge: 2023-10-05 | Disposition: A | Discharge status: Home / Self Care | Payer: Medicare HMO | Source: Ambulatory Visit | Attending: Nephrology | Admitting: Nephrology

## 2023-10-05 DIAGNOSIS — N1831 Chronic kidney disease, stage 3a: Secondary | ICD-10-CM

## 2023-12-15 NOTE — Progress Notes (Signed)
 Today the history is gathered from: 100% - patient  0% - alone  REFERRING PHYSICIAN: Rudolpho Norleen Lenis, MD PRIMARY CARE PHYSICIAN:  Rudolpho Norleen Lenis, MD  IMPRESSION/PLAN  Terry [name] is a 72 y.o. male presenting for evaluation of  NUMBNESS/ TINGLING / NEUROPATHY/ IMBALANCE - Improving. - Patient with improving neuropathy in bilateral feet. Ongoing numbness about tongue, right side of lips, and digits 1-3 of right hand. Taking Nortriptyline 40 mg nightly. - Continue taking Nortriptyline 40 mg nightly for neuropathy. Refill 10 mg pills. - Can consider increasing Nortriptyline to 50 mg at next visit. Can call if interested in increasing the dose before next visit.  HISTORY OF STROKE/ EXPRESSIVE APHASIA/ MEMORY CONCERNS - Unchanged. - Patient with ongoing word finding difficulty and expressive aphasia. Repeating questions and conversations. Taking Aspirin  81 mg daily, Eliquis  5 mg BID.  - Continue taking Aspirin  81 mg daily. - Continue taking Eliquis  5 mg twice daily as prescribed. - Can consider starting medication for anxiety at future visit if ongoing frustration. - Memory evaluation at last visit is 27/30, will repeat at next visit.  Follow up with Dr. Lane in 6 months.   MEDICATIONS PREVIOUSLY TRIED Gabapentin (vivid dreams and dizzy)  CHIEF COMPLAINT & HPI  Terry [name] is a 72 y.o. male presenting for evaluation of: Chief Complaint  Patient presents with  . NUMBNESS/ TINGLING / NEUROPATHY/ IMBALANCE  . HISTORY OF STROKE/ EXPRESSIVE APHASIA/ MEMORY CONCERNS    NUMBNESS/ TINGLING / NEUROPATHY/ IMBALANCE Patient with improving neuropathy in bilateral feet, worse at night. Does not radiate up legs. Notes ongoing numbness about tongue and right side of lips and digits 1-3 of right hand. Occasional imbalance. No falls. Sleep is improving. Taking Nortriptyline 40 mg nightly.  HISTORY OF STROKE/ EXPRESSIVE APHASIA/ MEMORY CONCERNS Patient with recent episode of dizziness with  prolonged standing and dehydration. Resolved with hydration. Reports ongoing word finding difficulty and expressive aphasia. Repeating questions and conversations. Frustration surrounding expressive aphasia. Continues to stay physically active. Sleep is good, ongoing daytime fatigue. Taking Aspirin  81 mg daily, Eliquis  5 mg BID.   DATA SUMMARY: 04/01/21 MRI BRAIN WO CONTRAST IMPRESSION:  Acute infarct left PCA territory involving the left hippocampus and  left thalamus  Probable small acute infarct in the high right parietal lobe. This  suggests emboli.  Chronic infarct left frontal lobe   04/01/21 CT HEAD WO CONTRAST IMPRESSION:  1. No acute intracranial findings.  2. Area of encephalomalacia and gliosis in the left frontal lobe and  insula corresponding to infarct seen on prior MRI.  04/01/21 CT ANGIO HEAD AND NECK W WO CONTRAST IMPRESSION:  1. 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  2. Mild atheromatous disease elsewhere about the major arterial  vasculature of the head and neck as above. No other hemodynamically  significant or correctable stenosis.   05/10/2020 MRI IMPRESSION:  1. Mild enlargement/superior extension of an acute to early subacute  left MCA infarct in the frontal lobe.  2. Evolving punctate infarcts elsewhere in the cerebrum and  cerebellum as seen on the recent prior MRI.  3. No large vessel occlusion.  4. 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   05/07/2020 CAROTID US  IMPRESSION:  Color duplex indicates moderate heterogeneous plaque with no  hemodynamically significant stenosis by duplex criteria in the  extracranial cerebrovascular circulation.  05/07/2020 IMPRESSION:  Multiple areas of acute infarct including both cerebral hemispheres  and in the cerebellum bilaterally. Findings  compatible with emboli.  No hemorrhage or mass.     02/05/2020 EMG Impression: Abnormal study.  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     MEMORY EVALUATION: 09/15/2023 - 27/30 06/23/2022 - 30/30  VISIT SUMMARIES: 05/19/21: ongoing. Referral to speech therapy. Talk with dermatologist about alcohol interactions with medications. Continue Eliquis  5mg  BID. Continue aspirin  81mg  daily.Monitor blood pressure at home. If consistently elevated after recheck, please go to urgent care or ED to be evaluated.Continue atorvastatin  80mg  daily.  11/18/2020: Symptoms ongoing. Continue taking Eliquis  5 mg twice daily for stroke prevention. Continue taking aspirin  81 mg daily for stroke prevention.   05/21/20: Patient with numbness and tingling, ongoing, and history of stroke, new to me. Continue Eliquis  5 mg as prescribed for secondary stroke prevention. Continue aspirin  81 mg daily for secondary stroke prevention.  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  Recommend he start a baby aspirin  81 mg Monday, Wednesday, Friday. Continue to stay active by swimming.  Follow Dr. Ammon for cardiac work up.  Call our office if symptoms worsen. Will consider starting Cymbalta or Lyrica.  If balance worsens or if he has falls will consider referral to PT. Encouraged patient to use compression stockings.   01/03/2020 Patient with neuropathy.- New, to me. Patient with burning, tingling and numbness located in the bottom of his feet. Worse in the last month. EMG of lowers to evaluate severity of symptoms. Encouraged patient to keep walking daily. Start Gabapentin 100 mg at night for 3 nights, then increase to 200 mg at night for 3 nights, then increase to 300 mg at night. Call our office in 2-3 weeks and let us  know how symptoms are. Will consider increasing Gabapentin.Decrease wine at night while taking Gabapentin. Labs today  Vitamin B12, Thiamine, Vitamin D, Ferritin, Folate, A1C,  TSH.   MEDICATIONS Current Outpatient Medications  Medication Sig Dispense Refill  . apoaequorin (PREVAGEN) capsule Take by mouth once daily    . aspirin  81 MG EC tablet Take 81 mg by mouth once daily    . atorvastatin  (LIPITOR) 80 MG tablet TAKE 1 TABLET (80 MG TOTAL) BY MOUTH ONCE DAILY. 90 tablet 1  . camphor-menthoL (SARNA ORIGINAL) lotion Apply topically as needed for Itching    . cetirizine (ZYRTEC) 10 MG tablet Take 10 mg by mouth once daily    . ELIQUIS  5 mg tablet TAKE 1 TABLET BY MOUTH EVERY 12 HOURS 60 tablet 11  . nortriptyline (PAMELOR) 10 MG capsule Take 30 mg at night for two weeks then if needed can increase to 40 mg at night and continue that dose 120 capsule 2  . triamcinolone 0.1 % cream APPLY TWICE DAILY TO RAISED ITCHY AREAS ON THE BODY UNTIL FLAT. MAX 2 WEEKS AT A TIME ON THE BODY. REPEAT AS NEEDED 454 g 1  . valACYclovir  (VALTREX ) 500 MG tablet TAKE ONE TABLET BY MOUTH DAILY AT BEDTIME 90 tablet 0  . nortriptyline (PAMELOR) 10 MG capsule TAKE 2 CAPSULES BY MOUTH AT NIGHT (Patient not taking: Reported on 12/15/2023) 180 capsule 3  . omeprazole (PRILOSEC) 20 MG DR capsule TAKE 1 CAPSULE BY MOUTH TWICE A DAY BEFORE A MEAL. (Patient not taking: Reported on 12/15/2023) 180 capsule 1  . predniSONE (DELTASONE) 20 MG tablet TAKE 3 TABS IN AM FOR 5 DAYS,2 TABS IN AM FOR 5 DAYS THEN TAKE 1 TAB IN AM FOR 5 DAYS (Patient  not taking: Reported on 12/15/2023) 30 tablet 0   No current facility-administered medications for this visit.    ALLERGIES Allergies  Allergen Reactions  . Ciprofloxacin Swelling and Other (See Comments)  . Penicillins Shortness Of Breath  . Lyrica [Pregabalin] Swelling  . Penicillin G Other (See Comments)  . Iodinated Contrast Media Rash and Other (See Comments)  . Retin-A [Tretinoin] Unknown and Rash  . Vioxx [Rofecoxib] Rash  . Zyrtec [Cetirizine] Unknown and Rash     EXAM   Vitals:   12/15/23 0925  BP: 106/77  Pulse: 82  Weight: 85.4 kg (188  lb 3.2 oz)  Height: 167.6 cm (5' 6)  PainSc: 0-No pain     Body mass index is 30.38 kg/m.   GENERAL: Pleasant male, in no acute distress. Normocephalic and atraumatic.  The baseline comprehensive neurological exam obtained by Dr. Lane on 05/22/2020 is included below.  Significant changes from today's visit are shown in bold.   MUSCULOSKELETAL: Bulk - Normal Tone - Normal Pronator Drift - Absent bilaterally. Ambulation - Gait and station are generally steady Romberg - negative  R/L 5/5    Shoulder abduction (deltoid/supraspinatus, axillary/suprascapular n, C5) 5/5    Elbow flexion (biceps brachii, musculoskeletal n, C5-6) 5/5    Elbow extension (triceps, radial n, C7) 5/5    Finger adduction (interossei, ulnar n, T1)  5/5    Hip flexion (iliopsoas, L1/L2) 5/5    Knee flexion (hamstrings, sciatic n, L5/S1)  5/5    Knee extension (quadriceps, femoral n, L3/4) 5/5    Ankle dorsiflexion (tibialis anterior, deep fibular n, L4/5) 5/5    Ankle plantarflexion (gastroc, tibial n, S1)   NEUROLOGICAL: MENTAL STATUS: Patient is oriented to person, place and time.   Short-term memory is intact Long-term memory is intact.   Attention span and concentration are intact.   Naming and repetition are intact. Comprehension is intact.   Expressive speech is intact.   Patient's fund of knowledge is within normal limits for educational level.  CRANIAL NERVES: Visual acuity and visual fields are intact         Extraocular muscles are intact                        Facial sensation is intact bilaterally                Facial strength is intact bilaterally                   Hearing is intact bilaterally                              Palate elevates midline, normal phonation      COORDINATION/CEREBELLAR: Finger to nose testing is deferred      PAST MEDICAL HISTORY Past Medical History:  Diagnosis Date  . Arthritis    OA  . Bursitis of left hip   . Cataract   . CHF (congestive  heart failure), NYHA class II, chronic, systolic (CMS/HHS-HCC) 04/05/2020  . Diverticulosis   . Epistaxis   . Essential hypertension, benign   . GERD (gastroesophageal reflux disease)   . Hard of hearing   . Herpes simplex without mention of complication   . History of cardiac monitoring   . History of stroke   . Mitral valve disorders(424.0)   . Neuropathy    bilateral feet   . Neuropathy   . Nonrheumatic mitral  valve insufficiency 04/01/2015  . Other and unspecified hyperlipidemia   . Other psoriasis   . Retinal detachment   . Right rotator cuff tear   . Seasonal allergies   . Severe mitral regurgitation 02/04/2017  . Shingles   . Stroke (CMS/HHS-HCC)   . Testicular pain   . TMJ syndrome   . Tubular adenoma 06/01/14  . Varicose vein of leg     PAST SURGICAL HISTORY Past Surgical History:  Procedure Laterality Date  . COLONOSCOPY  06/01/2014  . COLONOSCOPY  03/06/2020   PH Adenomatous Polyp; Diverticulosis: CBF 02/2024  . REPLACEMENT MITRAL VALVE VIA HEART PORT N/A 04/12/2020   Procedure: REPLACEMENT, MITRAL VALVE, VIA HEARTPORT, WITH CARDIOPULMONARY BYPASS,;  Surgeon: Alford Nancyann BIRCH, MD;  Location: [facility];  Service: Cardiothoracic;  Laterality: N/A;  . COLONOSCOPY    . ENDOVENOUS ABLATION LEG VEIN Right   . REPAIR RETINAL DETACHMENT BY AIR/GAS    . TEMPOROMANDIBULAR JOINT ARTHROPLASTY      FAMILY HISTORY Family History  Problem Relation Name Age of Onset  . Cirrhosis Father    . Lung cancer Father    . Alcohol abuse Father    . Stroke Mother    . Dementia Mother    . Stroke Brother    . Coronary Artery Disease (Blocked arteries around heart) Brother    . Coronary Artery Disease (Blocked arteries around heart) Half-Brother    . Stroke Half-Brother    . Hyperlipidemia (Elevated cholesterol) Half-Sister      SOCIAL HISTORY  Social History   Tobacco Use  . Smoking status: Former    Types: Cigarettes    Passive exposure: Never  . Smokeless tobacco:  Never  Vaping Use  . Vaping status: Never Used  Substance Use Topics  . Alcohol use: Not Currently  . Drug use: No     REVIEW OF SYSTEMS:  13 system ROS form was given to the patient to complete and I have reviewed it.  The form was sent for scan to the patient's EHR.  Pertinent positives and negatives are mentioned above in the HPI and all other systems are negative.   DATA  I have personally reviewed all of the data outlined below both prior to the appointment and during the appointment with the patient as appropriate.  Appointment on 06/30/2023  Component Date Value Ref Range Status  . Glucose 06/30/2023 119 (H)  70 - 110 mg/dL Final  . Sodium 92/96/7975 141  136 - 145 mmol/L Final  . Potassium 06/30/2023 4.4  3.6 - 5.1 mmol/L Final  . Chloride 06/30/2023 107  97 - 109 mmol/L Final  . Carbon Dioxide (CO2) 06/30/2023 26.9  22.0 - 32.0 mmol/L Final  . Urea Nitrogen (BUN) 06/30/2023 19  7 - 25 mg/dL Final  . Creatinine 92/96/7975 1.6 (H)  0.7 - 1.3 mg/dL Final  . Glomerular Filtration Rate (eGFR) 06/30/2023 45 (L)  >60 mL/min/1.73sq m Final  . Calcium  06/30/2023 9.3  8.7 - 10.3 mg/dL Final  . AST  92/96/7975 25  8 - 39 U/L Final  . ALT  06/30/2023 22  6 - 57 U/L Final  . Alk Phos (alkaline Phosphatase) 06/30/2023 75  34 - 104 U/L Final  . Albumin 06/30/2023 4.1  3.5 - 4.8 g/dL Final  . Bilirubin, Total 06/30/2023 0.6  0.3 - 1.2 mg/dL Final  . Protein, Total 06/30/2023 6.4  6.1 - 7.9 g/dL Final  . A/G Ratio 92/96/7975 1.8  1.0 - 5.0 gm/dL Final  .  WBC (White Blood Cell Count) 06/30/2023 5.8  4.1 - 10.2 10^3/uL Final  . RBC (Red Blood Cell Count) 06/30/2023 4.21 (L)  4.69 - 6.13 10^6/uL Final  . Hemoglobin 06/30/2023 14.5  14.1 - 18.1 gm/dL Final  . Hematocrit 92/96/7975 41.1  40.0 - 52.0 % Final  . MCV (Mean Corpuscular Volume) 06/30/2023 97.6  80.0 - 100.0 fl Final  . MCH (Mean Corpuscular Hemoglobin) 06/30/2023 34.4 (H)  27.0 - 31.2 pg Final  . MCHC (Mean Corpuscular  Hemoglobin * 06/30/2023 35.3  32.0 - 36.0 gm/dL Final  . Platelet Count 06/30/2023 173  150 - 450 10^3/uL Final  . RDW-CV (Red Cell Distribution Widt* 06/30/2023 12.1  11.6 - 14.8 % Final  . MPV (Mean Platelet Volume) 06/30/2023 10.2  9.4 - 12.4 fl Final  . Neutrophils 06/30/2023 3.86  1.50 - 7.80 10^3/uL Final  . Lymphocytes 06/30/2023 0.90 (L)  1.00 - 3.60 10^3/uL Final  . Monocytes 06/30/2023 0.55  0.00 - 1.50 10^3/uL Final  . Eosinophils 06/30/2023 0.48  0.00 - 0.55 10^3/uL Final  . Basophils 06/30/2023 0.03  0.00 - 0.09 10^3/uL Final  . Neutrophil % 06/30/2023 66.2  32.0 - 70.0 % Final  . Lymphocyte % 06/30/2023 15.4  10.0 - 50.0 % Final  . Monocyte % 06/30/2023 9.4  4.0 - 13.0 % Final  . Eosinophil % 06/30/2023 8.2 (H)  1.0 - 5.0 % Final  . Basophil% 06/30/2023 0.5  0.0 - 2.0 % Final  . Immature Granulocyte % 06/30/2023 0.3  <=0.7 % Final  . Immature Granulocyte Count 06/30/2023 0.02  <=0.06 10^3/L Final  . Hemoglobin A1C 06/30/2023 5.9 (H)  4.2 - 5.6 % Final  . Average Blood Glucose (Calc) 06/30/2023 123  mg/dL Final  . Cholesterol, Total 06/30/2023 135  100 - 200 mg/dL Final  . Triglyceride 92/96/7975 135  35 - 199 mg/dL Final  . HDL (High Density Lipoprotein) Cho* 06/30/2023 30.6  29.0 - 71.0 mg/dL Final  . LDL Calculated 06/30/2023 77  0 - 130 mg/dL Final  . VLDL Cholesterol 06/30/2023 27  mg/dL Final  . Cholesterol/HDL Ratio 06/30/2023 4.4   Final  . Color 06/30/2023 Yellow  Colorless, Straw, Light Yellow, Yellow, Dark Yellow Final  . Clarity 06/30/2023 Clear  Clear Final  . Specific Gravity 06/30/2023 1.020  1.005 - 1.030 Final  . pH, Urine 06/30/2023 5.5  5.0 - 8.0 Final  . Protein, Urinalysis 06/30/2023 Negative  Negative mg/dL Final  . Glucose, Urinalysis 06/30/2023 Negative  Negative mg/dL Final  . Ketones, Urinalysis 06/30/2023 Negative  Negative mg/dL Final  . Blood, Urinalysis 06/30/2023 Negative  Negative Final  . Nitrite, Urinalysis 06/30/2023 Negative  Negative  Final  . Leukocyte Esterase, Urinalysis 06/30/2023 Negative  Negative Final  . Hyaline Casts, Urinalysis 06/30/2023 1  /lpf Final  . Bilirubin, Urinalysis 06/30/2023 Negative  Negative Final  . Urobilinogen, Urinalysis 06/30/2023 0.2  0.2 - 1.0 mg/dL Final  . WBC, UA 92/96/7975 1  <=5 /hpf Final  . Red Blood Cells, Urinalysis 06/30/2023 1  <=3 /hpf Final  . Bacteria, Urinalysis 06/30/2023 0-5  0 - 5 /hpf Final  . Squamous Epithelial Cells, Urinaly* 06/30/2023 0  /hpf Final  . PSA (Prostate Specific Antigen), T* 06/30/2023 0.59  0.10 - 4.00 ng/mL Final   No follow-ups on file.  Payor: [employer] MEDICARE ADVANTAGE / Plan: [employer] PPO MEDICARE ADVANTAGE / Product Type: PPO /   This note is partially written by Tyron Koyanagi, in the presence of and acting as  the scribe of Dr. Arthea Farrow.   I have reviewed, edited and added to the note as needed to reflect my best personal medical judgment.    Dr. Arthea Farrow, MD [facility] [place], [place] Ph:  [phone] Fax:  [phone]

## 2024-01-11 DIAGNOSIS — R7309 Other abnormal glucose: Secondary | ICD-10-CM | POA: Diagnosis not present

## 2024-01-11 DIAGNOSIS — I10 Essential (primary) hypertension: Secondary | ICD-10-CM | POA: Diagnosis not present

## 2024-01-19 DIAGNOSIS — R7303 Prediabetes: Secondary | ICD-10-CM | POA: Diagnosis not present

## 2024-01-19 DIAGNOSIS — G8929 Other chronic pain: Secondary | ICD-10-CM | POA: Diagnosis not present

## 2024-01-19 DIAGNOSIS — I4891 Unspecified atrial fibrillation: Secondary | ICD-10-CM | POA: Diagnosis not present

## 2024-01-19 DIAGNOSIS — E7800 Pure hypercholesterolemia, unspecified: Secondary | ICD-10-CM | POA: Diagnosis not present

## 2024-01-19 DIAGNOSIS — I10 Essential (primary) hypertension: Secondary | ICD-10-CM | POA: Diagnosis not present

## 2024-01-19 DIAGNOSIS — K219 Gastro-esophageal reflux disease without esophagitis: Secondary | ICD-10-CM | POA: Diagnosis not present

## 2024-01-19 DIAGNOSIS — M25551 Pain in right hip: Secondary | ICD-10-CM | POA: Diagnosis not present

## 2024-01-19 DIAGNOSIS — I5022 Chronic systolic (congestive) heart failure: Secondary | ICD-10-CM | POA: Diagnosis not present

## 2024-01-19 DIAGNOSIS — N1831 Chronic kidney disease, stage 3a: Secondary | ICD-10-CM | POA: Diagnosis not present

## 2024-01-19 DIAGNOSIS — I4892 Unspecified atrial flutter: Secondary | ICD-10-CM | POA: Diagnosis not present

## 2024-01-19 DIAGNOSIS — G629 Polyneuropathy, unspecified: Secondary | ICD-10-CM | POA: Diagnosis not present

## 2024-01-19 DIAGNOSIS — Z125 Encounter for screening for malignant neoplasm of prostate: Secondary | ICD-10-CM | POA: Diagnosis not present

## 2024-01-27 DIAGNOSIS — G8929 Other chronic pain: Secondary | ICD-10-CM | POA: Diagnosis not present

## 2024-01-27 DIAGNOSIS — M1611 Unilateral primary osteoarthritis, right hip: Secondary | ICD-10-CM | POA: Diagnosis not present

## 2024-01-27 DIAGNOSIS — M25551 Pain in right hip: Secondary | ICD-10-CM | POA: Diagnosis not present

## 2024-01-27 DIAGNOSIS — M7061 Trochanteric bursitis, right hip: Secondary | ICD-10-CM | POA: Diagnosis not present

## 2024-01-31 DIAGNOSIS — H2512 Age-related nuclear cataract, left eye: Secondary | ICD-10-CM | POA: Diagnosis not present

## 2024-01-31 DIAGNOSIS — Z961 Presence of intraocular lens: Secondary | ICD-10-CM | POA: Diagnosis not present

## 2024-01-31 DIAGNOSIS — H04123 Dry eye syndrome of bilateral lacrimal glands: Secondary | ICD-10-CM | POA: Diagnosis not present

## 2024-01-31 DIAGNOSIS — H35371 Puckering of macula, right eye: Secondary | ICD-10-CM | POA: Diagnosis not present

## 2024-02-25 DIAGNOSIS — K219 Gastro-esophageal reflux disease without esophagitis: Secondary | ICD-10-CM | POA: Diagnosis not present

## 2024-03-08 DIAGNOSIS — I340 Nonrheumatic mitral (valve) insufficiency: Secondary | ICD-10-CM | POA: Diagnosis not present

## 2024-03-08 DIAGNOSIS — I5022 Chronic systolic (congestive) heart failure: Secondary | ICD-10-CM | POA: Diagnosis not present

## 2024-03-08 DIAGNOSIS — I4891 Unspecified atrial fibrillation: Secondary | ICD-10-CM | POA: Diagnosis not present

## 2024-03-08 DIAGNOSIS — N1831 Chronic kidney disease, stage 3a: Secondary | ICD-10-CM | POA: Diagnosis not present

## 2024-03-08 DIAGNOSIS — I639 Cerebral infarction, unspecified: Secondary | ICD-10-CM | POA: Diagnosis not present

## 2024-03-08 DIAGNOSIS — E785 Hyperlipidemia, unspecified: Secondary | ICD-10-CM | POA: Diagnosis not present

## 2024-04-17 DIAGNOSIS — Z952 Presence of prosthetic heart valve: Secondary | ICD-10-CM | POA: Diagnosis not present

## 2024-04-17 DIAGNOSIS — I10 Essential (primary) hypertension: Secondary | ICD-10-CM | POA: Diagnosis not present

## 2024-04-17 DIAGNOSIS — E7800 Pure hypercholesterolemia, unspecified: Secondary | ICD-10-CM | POA: Diagnosis not present

## 2024-04-17 DIAGNOSIS — R0609 Other forms of dyspnea: Secondary | ICD-10-CM | POA: Diagnosis not present

## 2024-04-17 DIAGNOSIS — Z9889 Other specified postprocedural states: Secondary | ICD-10-CM | POA: Diagnosis not present

## 2024-04-17 DIAGNOSIS — I4892 Unspecified atrial flutter: Secondary | ICD-10-CM | POA: Diagnosis not present

## 2024-04-17 DIAGNOSIS — I4891 Unspecified atrial fibrillation: Secondary | ICD-10-CM | POA: Diagnosis not present

## 2024-04-17 DIAGNOSIS — N1831 Chronic kidney disease, stage 3a: Secondary | ICD-10-CM | POA: Diagnosis not present

## 2024-04-17 DIAGNOSIS — I5022 Chronic systolic (congestive) heart failure: Secondary | ICD-10-CM | POA: Diagnosis not present

## 2024-04-17 NOTE — Progress Notes (Signed)
 Established Patient Visit   Chief Complaint: Chief Complaint  Patient presents with  . Follow-up   Date of Service: 04/17/2024 Date of Birth: August 13, 1951 PCP: Rudolpho Norleen Lenis, MD  History of Present Illness: Terry [name] is a 73 y.o.male patient who returns    1.  Severe mitral regurgitation  2.  Insignificant CAD, moderate mitral regurgitation per [facility] 02/18/2017   3.  Mitral valve prolapse   4.  Essential hypertension   5.  Previous 20 year smoking history, quit in 1980s  6.  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  Postoperative atrial fibrillation  8.  Acute CVA 05/07/2020  9.  Severe proximal left PCA, moderate proximal left ACA stenosis 05/10/2020  10. Acute CVA 03/2021  The patient underwent right and left heart catheterization on 02/22/2020, which revealed mildly reduced left ventricular function, insignificant coronary artery disease, severe mitral regurgitation, and no evidence for pulmonary hypertension.  2D echocardiogram on 11/02/2019 revealed mild LV systolic dysfunction with LVEF 45%, moderate LVH, severe mitral regurgitation, severe mitral valve prolapse of the posterior leaflet, severe left atrial enlargement, and mild tricuspid regurgitation, with anterior and apical hypokinesis. Previous 2D echocardiogram on 10/05/2018 revealed normal left ventricular function, with LVEF greater than 55%, with no LVH, with moderate to severe mitral regurgitation with severe mitral valve prolapse of the posterior leaflet.   The patient underwent mitral valve replacement with bioprosthetic valve 04/12/2020.  2D echocardiogram 04/16/2020 revealed normal left ventricular function, with LVEF greater than 55%, with stable appearing, bioprosthetic mitral valve.  The patient developed postoperative atrial fibrillation, discharged on aspirin  and amiodarone .  He developed slurred speech on 05/07/2020 MRI revealing multiple bilateral cerebral and cerebellar infarcts.  The patient was started on Eliquis .   The patient had recurrent right arm weakness on 05/10/2020.  Repeat MRI did not reveal any new infarcts.  MRA revealed severe proximal left PCA and moderate proximal left ACA stenosis and aspirin  was added in addition to Eliquis .   The patient was admitted to [facility] from 4/5-04/03/2021 for acute stroke, which presented with acute onset of right-sided weakness.  Brain MRI showed a small left thalamic acute ischemic infarction and left hippocampal acute infarction, with a probable small acute infarct in the high right parietal lobe, suggestive of emboli.  CTA was then obtained, which revealed an acute occlusion of the left PCA at the left P1/P2.  2D echocardiogram revealed normal left ventricular function with LVEF 55 to 60% with no regional wall motion abnormalities, grade 2 diastolic dysfunction, with no mural thrombus or valvular vegetation.  Previous transesophageal echocardiogram 03/2020 was negative for PFO.  The patient was discharged on atorvastatin  80 mg, advised to continue aspirin  and Eliquis , and is following up with neurology 05/19/2021.   Previous 13-day Holter monitor revealed predominant sinus rhythm with mean heart rate of 77 bpm, frequent premature ventricular contractions, without evidence for atrial fibrillation.  pressures 54-80, with heart rate 70 to 90 bpm.  At a recent office visit, the patient voiced concern about symptoms felt to be secondary to medication. The patient recently started taking methotrexate 12.5 mg every Friday per his dermatologist.  He stated that he starts feeling short of breath with exertion and extremely dizzy on Saturday through Tuesday, then symptoms gradually resolve by Wednesday without recurrence until Saturday again. He denies any significant chest pain. He denies orthopnea, weight gain, abdominal distention, or peripheral edema. He still has decreased stamina since his stroke. He denies presyncope or syncope. He also reported decreased appetite  since starting  methotrexate and has lost weight. He walks about 4000-6000 steps daily. He denies palpitations or heart racing. He has not talked to his dermatologist about these symptoms.  The patient was seen at the walk-in clinic on 07/27/2021 with complaints of chills, body aches, weakness, fatigue, loss of appetite, low-grade fever, with mild abdominal pain over the last few days.  He reported feeling short of breath with walking.  Abdominal x-ray was obtained. The patient was started on metronidazole for treatment of diverticulitis.  It is noted in the provider's note that the patient had an irregular heartbeat noted on exam and was advised to follow-up with cardiology for this and for evaluation of shortness of breath.  The patient has a known history of frequent premature ventricular contractions as noted on previous Holter monitor.  The patient reports that his symptoms seem to be better since starting treatment for diverticulitis.  He still continues to have dizziness 2 days after taking once weekly methotrexate that resolves after 3 days.  He denies any significant chest pain.  He denies palpitations.  He denies presyncope or syncope.  The patient has lost 3 additional pounds since last office visit 3 weeks ago and states that he has no appetite.  He does try to drink an Ensure.  ECG revealed sinus rhythm with occasional PVCs with incomplete right bundle branch block at a rate of 81 bpm.  The patient returns for a 41-month follow-up, and reports doing good.  He denies chest pain. He has mild exertional shortness of breath when walking at an incline that resolves promptly with rest. He denies palpitations. He denies presyncope or syncope. He denies peripheral edema. He is very active and is involved with a senior activities group with whom he hikes for about 2 miles a week, and also exercises at the gym 3-4 days a week where he walks for 2 miles.  2D echocardiogram 04/13/2023 revealed LVEF 45-50% with mild aortic  insufficiency and mitral regurgitation.  The patient has paroxysmal atrial fibrillation as stated above, currently on Eliquis  for stroke prevention which is tolerated well without bleeding side effects. He denies melena or hematochezia.  The patient has essential hypertension, blood pressure within normal range today, currently on diet therapy. The patient does not add salt to his foods.  The patient has hyperlipidemia, LDL 77 on 06/30/2023, on high intensity atorvastatin , which is well-tolerated without apparent side effects, followed by his primary care provider.   Past Medical and Surgical History  Past Medical History Past Medical History:  Diagnosis Date  . Arthritis    OA  . Bursitis of left hip   . Cataract   . CHF (congestive heart failure), NYHA class II, chronic, systolic (CMS/HHS-HCC) 04/05/2020  . Diverticulosis   . Epistaxis   . Essential hypertension, benign   . GERD (gastroesophageal reflux disease)   . Hard of hearing   . Herpes simplex without mention of complication   . History of cardiac monitoring   . History of stroke   . Mitral valve disorders(424.0)   . Neuropathy    bilateral feet   . Neuropathy   . 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 Other and unspecified hyperlipidemia   . Other psoriasis   . Retinal detachment   . Right rotator cuff tear   . Seasonal allergies   . Severe mitral regurgitation 02/04/2017  . Shingles   . Stroke (CMS/HHS-HCC)   . Testicular pain   . TMJ syndrome   . Tubular  adenoma 06/01/14  . Varicose vein of leg     Past Surgical History He has a past surgical history that includes Colonoscopy (06/01/2014); Colonoscopy; Repair Retinal Detachment By Air/Gas; Colonoscopy (03/06/2020); Endovenous Ablation Leg Vein (Right); Temporomandibular joint arthroplasty; and replacement mitral valve via heart port (N/A, 04/12/2020).   Medications and Allergies  Current Medications  Current Outpatient Medications  Medication Sig  Dispense Refill  . apoaequorin (PREVAGEN) capsule Take by mouth once daily    . aspirin  81 MG EC tablet Take 81 mg by mouth once daily    . atorvastatin  (LIPITOR) 80 MG tablet TAKE 1 TABLET (80 MG TOTAL) BY MOUTH ONCE DAILY. 90 tablet 1  . camphor-menthoL (SARNA ORIGINAL) lotion Apply topically as needed for Itching    . cetirizine (ZYRTEC) 10 MG tablet Take 10 mg by mouth once daily    . ELIQUIS  5 mg tablet TAKE 1 TABLET BY MOUTH EVERY 12 HOURS 60 tablet 11  . nortriptyline (PAMELOR) 10 MG capsule Take 30 mg at night for two weeks then if needed can increase to 40 mg at night and continue that dose 120 capsule 2  . nortriptyline (PAMELOR) 10 MG capsule TAKE 4 CAPSULES (40 MG TOTAL) BY MOUTH AT BEDTIME 360 capsule 0  . omeprazole (PRILOSEC) 20 MG DR capsule TAKE 1 CAPSULE BY MOUTH TWICE A DAY BEFORE A MEAL. 180 capsule 1  . triamcinolone 0.1 % cream Apply topically 2 (two) times daily 454 g 1  . valACYclovir  (VALTREX ) 500 MG tablet TAKE ONE TABLET BY MOUTH DAILY AT BEDTIME 90 tablet 0   No current facility-administered medications for this visit.    Allergies: Ciprofloxacin, Penicillins, Lyrica [pregabalin], Penicillin g, Iodinated contrast media, Retin-a [tretinoin], Vioxx [rofecoxib], and Zyrtec [cetirizine]  Social and Family History  Social History  reports that he has quit smoking. His smoking use included cigarettes. He has never been exposed to tobacco smoke. He has never used smokeless tobacco. He reports that he does not currently use alcohol. He reports that he does not use drugs.  Family History Family History  Problem Relation Name Age of Onset  . Cirrhosis Father    . Lung cancer Father    . Alcohol abuse Father    . Stroke Mother    . Dementia Mother    . Stroke Brother    . Coronary Artery Disease (Blocked arteries around heart) Brother    . Coronary Artery Disease (Blocked arteries around heart) Half-Brother    . Stroke Half-Brother    . Hyperlipidemia (Elevated  cholesterol) Half-Sister      Review of Systems   Review of Systems: The patient denies chest pain, with mild chronic exertional shortness of breath, without orthopnea, paroxysmal nocturnal dyspnea, pedal edema, palpitations, heart racing, presyncope, syncope, with peripheral neuropathy, with easy fatigability, with decreased stamina, with decreased sense of taste, with occasional dizziness. Review of 10 Systems is negative except as described above.  Physical Examination   Vitals: BP 106/66   Pulse 88   Ht 167.6 cm (5' 6)   Wt 85.3 kg (188 lb)   SpO2 98%   BMI 30.34 kg/m  Ht:167.6 cm (5' 6) Wt:85.3 kg (188 lb) ADJ:Anib surface area is 1.99 meters squared. Body mass index is 30.34 kg/m.  General: Alert and oriented. Well-appearing. No acute distress. HEENT: Pupils equally reactive to light and accomodation    Neck: Supple, no JVD Lungs: Normal effort of breathing; clear to auscultation bilaterally; no wheezes, rales, rhonchi Heart: Regular rate and rhythm,  regular S1, S2 without murmur, rubs, or gallop Abdomen:  nondistended, with normal bowel sounds Extremities: no cyanosis, clubbing, or edema Peripheral Pulses: 2+ radial Skin: Warm, dry, no diaphoresis Neuro: grossly intact  Assessment   73 y.o. male with  1. Atrial fibrillation and flutter (CMS/HHS-HCC)   2. CHF (congestive heart failure), NYHA class II, chronic, systolic (CMS/HHS-HCC)   3. Essential hypertension   4. S/P cardiac catheterization   5. Status post mitral valve replacement   6. Dyspnea on exertion   7. Pure hypercholesterolemia   8. CKD stage 3a, GFR 45-59 ml/min (CMS/HHS-HCC)     73 year old gentleman with mitral valve prolapse, with severe mitral regurgitation by 2D echocardiogram. He underwent left and right heart catheterization on 02/22/2020, which revealed mildly reduced left ventricular function, insignificant coronary artery disease, severe mitral regurgitation, and no evidence of pulmonary  hypertension.  The patient underwent mitral valve replacement 04/12/2020 with postoperative atrial fibrillation treated with aspirin  and amiodarone .  The patient experienced slurred speech and right sided weakness on 05/07/2020 with MRI revealing multiple bilateral cerebral and cerebellar infarcts.  Plavix  was discontinued and  the patient was started on Eliquis .  He again presented on 05/10/2020 with right arm weakness without evidence for new infarcts.  MRA revealed severe proximal left PCA and moderate proximal left ACA stenosis and aspirin  was added to Eliquis .  13-day Holter monitor revealed predominant sinus rhythm with frequent PVCs without evidence for atrial fibrillation. The patient had recurrent acute CVA in 03/2021. The patient exercises daily, and reports feeling well.    Plan   1.  Continue current medications 2.  Counseled patient about low-sodium diet 3.  DASH diet printed instructions given to the patient 4.  Counseled patient about low-cholesterol diet 5.  Continue atorvastatin  for hyperlipidemia management 6.  Low-fat and cholesterol diet printed instructions given to the patient 7.  Return to clinic for follow-up in 6 months   No orders of the defined types were placed in this encounter.   Return in about 6 months (around 10/17/2024).   Terry DOOMS, MD PhD [facility]

## 2024-06-14 DIAGNOSIS — G629 Polyneuropathy, unspecified: Secondary | ICD-10-CM | POA: Diagnosis not present

## 2024-06-14 DIAGNOSIS — R413 Other amnesia: Secondary | ICD-10-CM | POA: Diagnosis not present

## 2024-06-14 DIAGNOSIS — R202 Paresthesia of skin: Secondary | ICD-10-CM | POA: Diagnosis not present

## 2024-06-14 DIAGNOSIS — R200 Anesthesia of skin: Secondary | ICD-10-CM | POA: Diagnosis not present

## 2024-06-14 DIAGNOSIS — Z8673 Personal history of transient ischemic attack (TIA), and cerebral infarction without residual deficits: Secondary | ICD-10-CM | POA: Diagnosis not present

## 2024-06-14 DIAGNOSIS — R4701 Aphasia: Secondary | ICD-10-CM | POA: Diagnosis not present

## 2024-06-14 NOTE — Progress Notes (Signed)
 Today the history is gathered from: 100% - patient  0% - alone in the office  REFERRING PHYSICIAN: Lane Arthea Locus, MD PRIMARY CARE PHYSICIAN:  Terry Norleen Lenis, MD  IMPRESSION/PLAN  [name] is a 73 y.o. male presenting for evaluation of  NUMBNESS/ TINGLING / NEUROPATHY/ IMBALANCE - Worsening. - Patient with worsening neuropathy in bilateral hands and feet, worse in the bottoms of the feet and at night. Ongoing imbalance. Taking Nortriptyline 40 mg nightly.  - Increase Nortriptyline to 50 mg nightly and an extra 10 mg as needed for neuropathy. Refill 50 mg and 10 mg pills. - Recommend following with PCP for ED medications. No neurological restrictions at this time.   HISTORY OF STROKE/ EXPRESSIVE APHASIA/ MEMORY CONCERNS - Worsening. - Patient with worsening short term memory concerns and expressive aphasia. Taking Aspirin  81 mg daily and Eliquis  5 mg BID.  - Start taking Donepezil (Aricept) 5 mg nightly to slow the progression of memory loss.  - Continue taking Eliquis  5 mg twice daily, as prescribed by alternate provider for stroke prevention. - Continue taking Aspirin  81 mg for stroke prevention. - Memory score today is 25/30. Will repeat in 6 months.  Follow up with Dr. Lane in 3 months.   MEDICATIONS PREVIOUSLY TRIED Gabapentin (vivid dreams and dizzy) Lyrica 25 mg BID (swelling)  CHIEF COMPLAINT & HPI  Terry [name] is a 73 y.o. male presenting for evaluation of: Chief Complaint  Patient presents with  . NUMBNESS/ TINGLING / NEUROPATHY/ IMBALANCE  . HISTORY OF STROKE/ EXPRESSIVE APHASIA/ MEMORY CONCERNS    NUMBNESS/ TINGLING / NEUROPATHY/ IMBALANCE Patient with worsening neuropathy in bilateral feet and hands, worse at the bottom of the feet, in the toes, and in the fingers. Numbness and tingling do not radiate up legs or hands. He does note difficulty with handwriting. Ongoing imbalance. No falls. Remains active by hiking and going to gym. No difficulty falling or  staying asleep. Neuropathy worse at night. Sleeping 8+ hours nightly but remains fatigued. Intermittent snoring. Taking Nortriptyline 40 mg nightly.  HISTORY OF STROKE/ EXPRESSIVE APHASIA/ MEMORY CONCERNS Patient with worsening short term memory since last visit. Worsening expressive aphasia and word finding difficulty. Continuing to follow with speech therapy. Difficulty remembering familiar names and faces. He reports repeating familiar names and faces. Taking Aspirin  81 mg daily and Eliquis  5 mg BID. Memory score today is 25/30.  DATA SUMMARY: 04/01/21 MRI BRAIN WO CONTRAST IMPRESSION:  Acute infarct left PCA territory involving the left hippocampus and  left thalamus  Probable small acute infarct in the high right parietal lobe. This  suggests emboli.  Chronic infarct left frontal lobe   04/01/21 CT HEAD WO CONTRAST IMPRESSION:  1. No acute intracranial findings.  2. Area of encephalomalacia and gliosis in the left frontal lobe and  insula corresponding to infarct seen on prior MRI.  04/01/21 CT ANGIO HEAD AND NECK W WO CONTRAST IMPRESSION:  1. 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  2. Mild atheromatous disease elsewhere about the major arterial  vasculature of the head and neck as above. No other hemodynamically  significant or correctable stenosis.   05/10/2020 MRI IMPRESSION:  1. Mild enlargement/superior extension of an acute to early subacute  left MCA infarct in the frontal lobe.  2. Evolving punctate infarcts elsewhere in the cerebrum and  cerebellum as seen on the recent prior MRI.  3. No large vessel occlusion.  4. 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   05/07/2020 CAROTID US  IMPRESSION:  Color duplex indicates moderate heterogeneous plaque with no  hemodynamically significant stenosis by duplex criteria in the  extracranial cerebrovascular circulation.  05/07/2020 IMPRESSION:  Multiple areas of acute  infarct including both cerebral hemispheres  and in the cerebellum bilaterally. Findings compatible with emboli.  No hemorrhage or mass.     02/05/2020 EMG Abnormal study.  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     MEMORY EVALUATION: 06/14/2024 - 25/30 09/15/2023 - 27/30 06/23/2022 - 30/30  VISIT SUMMARIES: 05/19/21: ongoing. Referral to speech therapy. Talk with dermatologist about alcohol interactions with medications. Continue Eliquis  5mg  BID. Continue aspirin  81mg  daily.Monitor blood pressure at home. If consistently elevated after recheck, please go to urgent care or ED to be evaluated.Continue atorvastatin  80mg  daily.  11/18/2020: Symptoms ongoing. Continue taking Eliquis  5 mg twice daily for stroke prevention. Continue taking aspirin  81 mg daily for stroke prevention.   05/21/20: Patient with numbness and tingling, ongoing, and history of stroke, new to me. Continue Eliquis  5 mg as prescribed for secondary stroke prevention. Continue aspirin  81 mg daily for secondary stroke prevention.  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  Recommend he start a baby aspirin  81 mg Monday, Wednesday, Friday. Continue to stay active by swimming.  Follow Dr. Ammon for cardiac work up.  Call our office if symptoms worsen. Will consider starting Cymbalta or Lyrica.  If balance worsens or if he has falls will consider referral to PT. Encouraged patient to use compression stockings.   01/03/2020 Patient with neuropathy.- New, to me. Patient with burning, tingling and numbness located in the bottom of his feet. Worse in the last month. EMG of lowers to evaluate severity of symptoms. Encouraged patient to keep walking daily. Start Gabapentin 100 mg at night for 3 nights, then increase to 200 mg at night for 3 nights, then increase to 300 mg at night. Call our office in 2-3  weeks and let us  know how symptoms are. Will consider increasing Gabapentin.Decrease wine at night while taking Gabapentin. Labs today  Vitamin B12, Thiamine, Vitamin D, Ferritin, Folate, A1C, TSH.   MEDICATIONS Current Outpatient Medications  Medication Sig Dispense Refill  . apoaequorin (PREVAGEN) capsule Take by mouth once daily    . aspirin  81 MG EC tablet Take 81 mg by mouth once daily    . atorvastatin  (LIPITOR) 80 MG tablet TAKE 1 TABLET (80 MG TOTAL) BY MOUTH ONCE DAILY. 90 tablet 1  . camphor-menthoL (SARNA ORIGINAL) lotion Apply topically as needed for Itching    . cetirizine (ZYRTEC) 10 MG tablet Take 10 mg by mouth once daily    . ELIQUIS  5 mg tablet TAKE 1 TABLET BY MOUTH EVERY 12 HOURS 60 tablet 11  . nortriptyline (PAMELOR) 10 MG capsule TAKE 4 CAPSULES (40 MG TOTAL) BY MOUTH AT BEDTIME 360 capsule 0  . omeprazole (PRILOSEC) 20 MG DR capsule TAKE 1 CAPSULE BY MOUTH TWICE A DAY BEFORE A MEAL. 180 capsule 1  . triamcinolone 0.1 % cream Apply topically 2 (two) times daily 454 g 1  . valACYclovir  (VALTREX ) 500 MG tablet TAKE ONE TABLET BY MOUTH DAILY AT BEDTIME 90 tablet 0  . nortriptyline (PAMELOR) 10 MG capsule Take 30 mg at night for two weeks then if needed can increase to 40 mg at night and continue that dose (Patient not taking: Reported on 06/14/2024) 120 capsule 2  No current facility-administered medications for this visit.    ALLERGIES Allergies  Allergen Reactions  . Ciprofloxacin Swelling and Other (See Comments)  . Penicillins Shortness Of Breath  . Lyrica [Pregabalin] Swelling  . Penicillin G Other (See Comments)  . Iodinated Contrast Media Rash and Other (See Comments)  . Retin-A [Tretinoin] Unknown and Rash  . Vioxx [Rofecoxib] Rash  . Zyrtec [Cetirizine] Unknown and Rash     EXAM   Vitals:   06/14/24 0912  Weight: 85.7 kg (189 lb)  Height: 167.6 cm (5' 6)  PainSc: 0-No pain      Body mass index is 30.51 kg/m.   GENERAL: Pleasant male, in  no acute distress. Normocephalic and atraumatic.  The baseline comprehensive neurological exam obtained by Dr. Lane on 05/22/2020 is included below.  Significant changes from today's visit are shown in bold.   MUSCULOSKELETAL: Bulk - Normal Tone - Normal Pronator Drift - Absent bilaterally. Ambulation - Gait and station are generally steady Romberg - negative  R/L 5/5    Shoulder abduction (deltoid/supraspinatus, axillary/suprascapular n, C5) 5/5    Elbow flexion (biceps brachii, musculoskeletal n, C5-6) 5/5    Elbow extension (triceps, radial n, C7) 5/5    Finger adduction (interossei, ulnar n, T1)  5/5    Hip flexion (iliopsoas, L1/L2) 5/5    Knee flexion (hamstrings, sciatic n, L5/S1)  5/5    Knee extension (quadriceps, femoral n, L3/4) 5/5    Ankle dorsiflexion (tibialis anterior, deep fibular n, L4/5) 5/5    Ankle plantarflexion (gastroc, tibial n, S1)   NEUROLOGICAL: MENTAL STATUS: Patient is oriented to person, place and time.   Short-term memory is intact Long-term memory is intact.   Attention span and concentration are intact.   Naming and repetition are intact. Comprehension is intact.   Expressive speech is intact.   Patient's fund of knowledge is within normal limits for educational level.  CRANIAL NERVES: Visual acuity and visual fields are intact         Extraocular muscles are intact                        Facial sensation is intact bilaterally                Facial strength is intact bilaterally                   Hearing is intact bilaterally                              Palate elevates midline, normal phonation      COORDINATION/CEREBELLAR: Finger to nose testing is deferred      PAST MEDICAL HISTORY Past Medical History:  Diagnosis Date  . Arthritis    OA  . Bursitis of left hip   . Cataract   . CHF (congestive heart failure), NYHA class II, chronic, systolic (CMS/HHS-HCC) 04/05/2020  . Diverticulosis   . Epistaxis   . Essential  hypertension, benign   . GERD (gastroesophageal reflux disease)   . Hard of hearing   . Herpes simplex without mention of complication   . History of cardiac monitoring   . History of stroke   . Mitral valve disorders(424.0)   . Neuropathy    bilateral feet   . Neuropathy   . 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 Other and unspecified hyperlipidemia   . Other psoriasis   .  Retinal detachment   . Right rotator cuff tear   . Seasonal allergies   . Severe mitral regurgitation 02/04/2017  . Shingles   . Stroke (CMS/HHS-HCC)   . Testicular pain   . TMJ syndrome   . Tubular adenoma 06/01/14  . Varicose vein of leg     PAST SURGICAL HISTORY Past Surgical History:  Procedure Laterality Date  . COLONOSCOPY  06/01/2014  . COLONOSCOPY  03/06/2020   PH Adenomatous Polyp; Diverticulosis: CBF 02/2024  . REPLACEMENT MITRAL VALVE VIA HEART PORT N/A 04/12/2020   Procedure: REPLACEMENT, MITRAL VALVE, VIA HEARTPORT, WITH CARDIOPULMONARY BYPASS,;  Surgeon: Alford Nancyann BIRCH, MD;  Location: [facility];  Service: Cardiothoracic;  Laterality: N/A;  . COLONOSCOPY    . ENDOVENOUS ABLATION LEG VEIN Right   . REPAIR RETINAL DETACHMENT BY AIR/GAS    . TEMPOROMANDIBULAR JOINT ARTHROPLASTY      FAMILY HISTORY Family History  Problem Relation Name Age of Onset  . Cirrhosis Father    . Lung cancer Father    . Alcohol abuse Father    . Stroke Mother    . Dementia Mother    . Stroke Brother    . Coronary Artery Disease (Blocked arteries around heart) Brother    . Coronary Artery Disease (Blocked arteries around heart) Half-Brother    . Stroke Half-Brother    . Hyperlipidemia (Elevated cholesterol) Half-Sister      SOCIAL HISTORY  Social History   Tobacco Use  . Smoking status: Former    Types: Cigarettes    Passive exposure: Never  . Smokeless tobacco: Never  Vaping Use  . Vaping status: Never Used  Substance Use Topics  . Alcohol use: Not Currently  . Drug use: No      REVIEW OF SYSTEMS:  13 system ROS form was given to the patient to complete and I have reviewed it.  The form was sent for scan to the patient's EHR.  Pertinent positives and negatives are mentioned above in the HPI and all other systems are negative.   DATA  I have personally reviewed all of the data outlined below both prior to the appointment and during the appointment with the patient as appropriate.  Appointment on 01/11/2024  Component Date Value Ref Range Status  . Glucose 01/11/2024 134 (H)  70 - 110 mg/dL Final  . Sodium 98/85/7974 141  136 - 145 mmol/L Final  . Potassium 01/11/2024 4.4  3.6 - 5.1 mmol/L Final  . Chloride 01/11/2024 106  97 - 109 mmol/L Final  . Carbon Dioxide (CO2) 01/11/2024 27.3  22.0 - 32.0 mmol/L Final  . Calcium  01/11/2024 9.7  8.7 - 10.3 mg/dL Final  . Urea Nitrogen (BUN) 01/11/2024 20  7 - 25 mg/dL Final  . Creatinine 98/85/7974 1.5 (H)  0.7 - 1.3 mg/dL Final  . Glomerular Filtration Rate (eGFR) 01/11/2024 49 (L)  >60 mL/min/1.73sq m Final  . BUN/Crea Ratio 01/11/2024 13.3  6.0 - 20.0 Final  . Anion Gap w/K 01/11/2024 12.1  6.0 - 16.0 Final  . Hemoglobin A1C 01/11/2024 5.8 (H)  4.2 - 5.6 % Final  . Average Blood Glucose (Calc) 01/11/2024 120  mg/dL Final   No follow-ups on file.  Payor: [employer] / Plan: [employer] / Product Type: PPO /   This note is partially written by Tyron Koyanagi, in the presence of and acting as the scribe of Dr. Arthea Farrow.   I have reviewed, edited and added to the note as needed  to reflect my best personal medical judgment.    Dr. Arthea Farrow, MD [facility] [place], [place] Ph:  [phone] Fax:  [phone]

## 2024-07-11 DIAGNOSIS — I10 Essential (primary) hypertension: Secondary | ICD-10-CM | POA: Diagnosis not present

## 2024-07-11 DIAGNOSIS — R7303 Prediabetes: Secondary | ICD-10-CM | POA: Diagnosis not present

## 2024-07-11 DIAGNOSIS — Z125 Encounter for screening for malignant neoplasm of prostate: Secondary | ICD-10-CM | POA: Diagnosis not present

## 2024-07-18 DIAGNOSIS — Z0001 Encounter for general adult medical examination with abnormal findings: Secondary | ICD-10-CM | POA: Diagnosis not present

## 2024-07-18 DIAGNOSIS — K219 Gastro-esophageal reflux disease without esophagitis: Secondary | ICD-10-CM | POA: Diagnosis not present

## 2024-07-18 DIAGNOSIS — I5022 Chronic systolic (congestive) heart failure: Secondary | ICD-10-CM | POA: Diagnosis not present

## 2024-07-18 DIAGNOSIS — Z1331 Encounter for screening for depression: Secondary | ICD-10-CM | POA: Diagnosis not present

## 2024-07-18 DIAGNOSIS — Z0000 Encounter for general adult medical examination without abnormal findings: Secondary | ICD-10-CM | POA: Diagnosis not present

## 2024-07-18 DIAGNOSIS — E7800 Pure hypercholesterolemia, unspecified: Secondary | ICD-10-CM | POA: Diagnosis not present

## 2024-07-18 DIAGNOSIS — N1831 Chronic kidney disease, stage 3a: Secondary | ICD-10-CM | POA: Diagnosis not present

## 2024-07-18 DIAGNOSIS — I4892 Unspecified atrial flutter: Secondary | ICD-10-CM | POA: Diagnosis not present

## 2024-07-18 DIAGNOSIS — I4891 Unspecified atrial fibrillation: Secondary | ICD-10-CM | POA: Diagnosis not present

## 2024-07-18 DIAGNOSIS — R7303 Prediabetes: Secondary | ICD-10-CM | POA: Diagnosis not present

## 2024-07-18 DIAGNOSIS — I10 Essential (primary) hypertension: Secondary | ICD-10-CM | POA: Diagnosis not present

## 2024-07-21 DIAGNOSIS — R112 Nausea with vomiting, unspecified: Secondary | ICD-10-CM | POA: Diagnosis not present

## 2024-08-18 ENCOUNTER — Observation Stay
Admit: 2024-08-18 | Discharge: 2024-08-18 | Disposition: A | Discharge status: Home / Self Care | Attending: Internal Medicine | Admitting: Internal Medicine

## 2024-08-18 ENCOUNTER — Emergency Department

## 2024-08-18 ENCOUNTER — Observation Stay

## 2024-08-18 ENCOUNTER — Inpatient Hospital Stay
Admission: EM | Admit: 2024-08-18 | Discharge: 2024-08-26 | DRG: 065 | Disposition: A | Discharge status: Skilled Nursing Facility | Attending: Internal Medicine | Admitting: Internal Medicine

## 2024-08-18 ENCOUNTER — Encounter: Payer: Self-pay | Admitting: Emergency Medicine

## 2024-08-18 ENCOUNTER — Other Ambulatory Visit: Payer: Self-pay

## 2024-08-18 DIAGNOSIS — G459 Transient cerebral ischemic attack, unspecified: Secondary | ICD-10-CM | POA: Diagnosis not present

## 2024-08-18 DIAGNOSIS — N179 Acute kidney failure, unspecified: Secondary | ICD-10-CM | POA: Diagnosis not present

## 2024-08-18 DIAGNOSIS — Z881 Allergy status to other antibiotic agents status: Secondary | ICD-10-CM

## 2024-08-18 DIAGNOSIS — I2510 Atherosclerotic heart disease of native coronary artery without angina pectoris: Secondary | ICD-10-CM | POA: Diagnosis present

## 2024-08-18 DIAGNOSIS — I639 Cerebral infarction, unspecified: Principal | ICD-10-CM | POA: Diagnosis present

## 2024-08-18 DIAGNOSIS — I739 Peripheral vascular disease, unspecified: Secondary | ICD-10-CM | POA: Diagnosis not present

## 2024-08-18 DIAGNOSIS — W19XXXA Unspecified fall, initial encounter: Secondary | ICD-10-CM | POA: Diagnosis present

## 2024-08-18 DIAGNOSIS — R29708 NIHSS score 8: Secondary | ICD-10-CM | POA: Diagnosis present

## 2024-08-18 DIAGNOSIS — Z91041 Radiographic dye allergy status: Secondary | ICD-10-CM

## 2024-08-18 DIAGNOSIS — I6523 Occlusion and stenosis of bilateral carotid arteries: Secondary | ICD-10-CM | POA: Diagnosis not present

## 2024-08-18 DIAGNOSIS — Z7982 Long term (current) use of aspirin: Secondary | ICD-10-CM

## 2024-08-18 DIAGNOSIS — Z751 Person awaiting admission to adequate facility elsewhere: Secondary | ICD-10-CM

## 2024-08-18 DIAGNOSIS — Z8673 Personal history of transient ischemic attack (TIA), and cerebral infarction without residual deficits: Secondary | ICD-10-CM

## 2024-08-18 DIAGNOSIS — M47812 Spondylosis without myelopathy or radiculopathy, cervical region: Secondary | ICD-10-CM | POA: Diagnosis not present

## 2024-08-18 DIAGNOSIS — Z1152 Encounter for screening for COVID-19: Secondary | ICD-10-CM

## 2024-08-18 DIAGNOSIS — I672 Cerebral atherosclerosis: Secondary | ICD-10-CM | POA: Diagnosis not present

## 2024-08-18 DIAGNOSIS — R070 Pain in throat: Secondary | ICD-10-CM | POA: Diagnosis not present

## 2024-08-18 DIAGNOSIS — K219 Gastro-esophageal reflux disease without esophagitis: Secondary | ICD-10-CM | POA: Diagnosis present

## 2024-08-18 DIAGNOSIS — Z043 Encounter for examination and observation following other accident: Secondary | ICD-10-CM | POA: Diagnosis not present

## 2024-08-18 DIAGNOSIS — R531 Weakness: Secondary | ICD-10-CM

## 2024-08-18 DIAGNOSIS — E7800 Pure hypercholesterolemia, unspecified: Secondary | ICD-10-CM | POA: Diagnosis present

## 2024-08-18 DIAGNOSIS — Z7901 Long term (current) use of anticoagulants: Secondary | ICD-10-CM

## 2024-08-18 DIAGNOSIS — M6281 Muscle weakness (generalized): Secondary | ICD-10-CM | POA: Diagnosis not present

## 2024-08-18 DIAGNOSIS — R29818 Other symptoms and signs involving the nervous system: Secondary | ICD-10-CM | POA: Diagnosis not present

## 2024-08-18 DIAGNOSIS — I480 Paroxysmal atrial fibrillation: Secondary | ICD-10-CM

## 2024-08-18 DIAGNOSIS — I341 Nonrheumatic mitral (valve) prolapse: Secondary | ICD-10-CM | POA: Diagnosis not present

## 2024-08-18 DIAGNOSIS — I10 Essential (primary) hypertension: Secondary | ICD-10-CM | POA: Diagnosis present

## 2024-08-18 DIAGNOSIS — Z880 Allergy status to penicillin: Secondary | ICD-10-CM

## 2024-08-18 DIAGNOSIS — R221 Localized swelling, mass and lump, neck: Secondary | ICD-10-CM | POA: Diagnosis not present

## 2024-08-18 DIAGNOSIS — N1330 Unspecified hydronephrosis: Secondary | ICD-10-CM | POA: Diagnosis present

## 2024-08-18 DIAGNOSIS — R471 Dysarthria and anarthria: Secondary | ICD-10-CM | POA: Diagnosis present

## 2024-08-18 DIAGNOSIS — R29810 Facial weakness: Secondary | ICD-10-CM | POA: Diagnosis present

## 2024-08-18 DIAGNOSIS — N281 Cyst of kidney, acquired: Secondary | ICD-10-CM | POA: Diagnosis not present

## 2024-08-18 DIAGNOSIS — I6381 Other cerebral infarction due to occlusion or stenosis of small artery: Principal | ICD-10-CM | POA: Diagnosis present

## 2024-08-18 DIAGNOSIS — Z87891 Personal history of nicotine dependence: Secondary | ICD-10-CM

## 2024-08-18 DIAGNOSIS — I6340 Cerebral infarction due to embolism of unspecified cerebral artery: Secondary | ICD-10-CM | POA: Diagnosis not present

## 2024-08-18 DIAGNOSIS — G8191 Hemiplegia, unspecified affecting right dominant side: Secondary | ICD-10-CM | POA: Diagnosis present

## 2024-08-18 DIAGNOSIS — R4781 Slurred speech: Secondary | ICD-10-CM | POA: Diagnosis not present

## 2024-08-18 DIAGNOSIS — Z953 Presence of xenogenic heart valve: Secondary | ICD-10-CM

## 2024-08-18 HISTORY — DX: Cerebral infarction, unspecified: I63.9

## 2024-08-18 LAB — ECHOCARDIOGRAM COMPLETE
AR max vel: 0.99 cm2
AV Area VTI: 1.02 cm2
AV Area mean vel: 0.92 cm2
AV Mean grad: 12 mmHg
AV Peak grad: 22.3 mmHg
Ao pk vel: 2.36 m/s
Area-P 1/2: 4.6 cm2
Height: 67 in
MV VTI: 0.93 cm2
S' Lateral: 4.8 cm
Weight: 2892.8 [oz_av]

## 2024-08-18 LAB — CBC
HCT: 39.3 % (ref 39.0–52.0)
Hemoglobin: 13.7 g/dL (ref 13.0–17.0)
MCH: 34.6 pg — ABNORMAL HIGH (ref 26.0–34.0)
MCHC: 34.9 g/dL (ref 30.0–36.0)
MCV: 99.2 fL (ref 80.0–100.0)
Platelets: 176 K/uL (ref 150–400)
RBC: 3.96 MIL/uL — ABNORMAL LOW (ref 4.22–5.81)
RDW: 12.8 % (ref 11.5–15.5)
WBC: 7.8 K/uL (ref 4.0–10.5)
nRBC: 0 % (ref 0.0–0.2)

## 2024-08-18 LAB — DIFFERENTIAL
Abs Immature Granulocytes: 0.03 K/uL (ref 0.00–0.07)
Basophils Absolute: 0.1 K/uL (ref 0.0–0.1)
Basophils Relative: 1 %
Eosinophils Absolute: 0.4 K/uL (ref 0.0–0.5)
Eosinophils Relative: 5 %
Immature Granulocytes: 0 %
Lymphocytes Relative: 11 %
Lymphs Abs: 0.9 K/uL (ref 0.7–4.0)
Monocytes Absolute: 0.6 K/uL (ref 0.1–1.0)
Monocytes Relative: 8 %
Neutro Abs: 5.9 K/uL (ref 1.7–7.7)
Neutrophils Relative %: 75 %

## 2024-08-18 LAB — COMPREHENSIVE METABOLIC PANEL WITH GFR
ALT: 20 U/L (ref 0–44)
AST: 24 U/L (ref 15–41)
Albumin: 3.9 g/dL (ref 3.5–5.0)
Alkaline Phosphatase: 80 U/L (ref 38–126)
Anion gap: 10 (ref 5–15)
BUN: 16 mg/dL (ref 8–23)
CO2: 25 mmol/L (ref 22–32)
Calcium: 9.4 mg/dL (ref 8.9–10.3)
Chloride: 107 mmol/L (ref 98–111)
Creatinine, Ser: 1.53 mg/dL — ABNORMAL HIGH (ref 0.61–1.24)
GFR, Estimated: 47 mL/min — ABNORMAL LOW (ref >60)
Glucose, Bld: 122 mg/dL — ABNORMAL HIGH (ref 70–99)
Potassium: 4.4 mmol/L (ref 3.5–5.1)
Sodium: 142 mmol/L (ref 135–145)
Total Bilirubin: 0.8 mg/dL (ref 0.0–1.2)
Total Protein: 6.8 g/dL (ref 6.5–8.1)

## 2024-08-18 LAB — HEMOGLOBIN A1C
Hgb A1c MFr Bld: 5.3 % (ref 4.8–5.6)
Mean Plasma Glucose: 105.41 mg/dL

## 2024-08-18 LAB — URINALYSIS, ROUTINE W REFLEX MICROSCOPIC
Bilirubin Urine: NEGATIVE
Glucose, UA: NEGATIVE mg/dL
Hgb urine dipstick: NEGATIVE
Ketones, ur: NEGATIVE mg/dL
Leukocytes,Ua: NEGATIVE
Nitrite: NEGATIVE
Protein, ur: NEGATIVE mg/dL
Specific Gravity, Urine: 1.008 (ref 1.005–1.030)
pH: 6 (ref 5.0–8.0)

## 2024-08-18 LAB — RPR: RPR Ser Ql: NONREACTIVE

## 2024-08-18 LAB — URINE DRUG SCREEN, QUALITATIVE (ARMC ONLY)
Amphetamines, Ur Screen: NOT DETECTED
Barbiturates, Ur Screen: NOT DETECTED
Benzodiazepine, Ur Scrn: NOT DETECTED
Cannabinoid 50 Ng, Ur ~~LOC~~: NOT DETECTED
Cocaine Metabolite,Ur ~~LOC~~: NOT DETECTED
MDMA (Ecstasy)Ur Screen: NOT DETECTED
Methadone Scn, Ur: NOT DETECTED
Opiate, Ur Screen: NOT DETECTED
Phencyclidine (PCP) Ur S: NOT DETECTED
Tricyclic, Ur Screen: POSITIVE — AB

## 2024-08-18 LAB — RESP PANEL BY RT-PCR (RSV, FLU A&B, COVID)  RVPGX2
Influenza A by PCR: NEGATIVE
Influenza B by PCR: NEGATIVE
Resp Syncytial Virus by PCR: NEGATIVE
SARS Coronavirus 2 by RT PCR: NEGATIVE

## 2024-08-18 LAB — TSH: TSH: 3.956 u[IU]/mL (ref 0.350–4.500)

## 2024-08-18 LAB — HIV ANTIBODY (ROUTINE TESTING W REFLEX): HIV Screen 4th Generation wRfx: NONREACTIVE

## 2024-08-18 LAB — MONONUCLEOSIS SCREEN: Mono Screen: NEGATIVE

## 2024-08-18 LAB — MAGNESIUM: Magnesium: 1.9 mg/dL (ref 1.7–2.4)

## 2024-08-18 LAB — ETHANOL: Alcohol, Ethyl (B): <15 mg/dL (ref <15)

## 2024-08-18 LAB — T4, FREE: Free T4: 0.91 ng/dL (ref 0.61–1.12)

## 2024-08-18 LAB — GROUP A STREP BY PCR: Group A Strep by PCR: NOT DETECTED

## 2024-08-18 LAB — PROTIME-INR
INR: 1.3 — ABNORMAL HIGH (ref 0.8–1.2)
Prothrombin Time: 16.4 s — ABNORMAL HIGH (ref 11.4–15.2)

## 2024-08-18 LAB — APTT: aPTT: 34 s (ref 24–36)

## 2024-08-18 MED ORDER — SODIUM CHLORIDE 0.9 % IV BOLUS (SEPSIS)
500.0000 mL | Freq: Once | INTRAVENOUS | Status: AC
  Administered 2024-08-18: 500 mL via INTRAVENOUS

## 2024-08-18 MED ORDER — PANTOPRAZOLE SODIUM 40 MG PO TBEC
40.0000 mg | DELAYED_RELEASE_TABLET | Freq: Every day | ORAL | Status: DC
  Administered 2024-08-18 – 2024-08-26 (×9): 40 mg via ORAL
  Filled 2024-08-18 (×9): qty 1

## 2024-08-18 MED ORDER — IOHEXOL 350 MG/ML SOLN
75.0000 mL | Freq: Once | INTRAVENOUS | Status: AC | PRN
  Administered 2024-08-18: 75 mL via INTRAVENOUS

## 2024-08-18 MED ORDER — PERFLUTREN LIPID MICROSPHERE
1.0000 mL | INTRAVENOUS | Status: AC | PRN
  Administered 2024-08-18: 2 mL via INTRAVENOUS

## 2024-08-18 MED ORDER — DIPHENHYDRAMINE HCL 25 MG PO CAPS
50.0000 mg | ORAL_CAPSULE | Freq: Once | ORAL | Status: AC
  Administered 2024-08-18: 50 mg via ORAL
  Filled 2024-08-18: qty 2

## 2024-08-18 MED ORDER — ATORVASTATIN CALCIUM 20 MG PO TABS
80.0000 mg | ORAL_TABLET | Freq: Every day | ORAL | Status: DC
  Administered 2024-08-18 – 2024-08-25 (×8): 80 mg via ORAL
  Filled 2024-08-18 (×8): qty 4

## 2024-08-18 MED ORDER — ACETAMINOPHEN 650 MG RE SUPP: 650.0000 mg | RECTAL | Status: DC | PRN

## 2024-08-18 MED ORDER — ACETAMINOPHEN 325 MG PO TABS
650.0000 mg | ORAL_TABLET | ORAL | Status: DC | PRN
  Administered 2024-08-20: 650 mg via ORAL
  Filled 2024-08-18 (×3): qty 2

## 2024-08-18 MED ORDER — DIPHENHYDRAMINE HCL 50 MG/ML IJ SOLN: 50.0000 mg | Freq: Once | INTRAMUSCULAR | Status: AC

## 2024-08-18 MED ORDER — ENOXAPARIN SODIUM 40 MG/0.4ML IJ SOSY
40.0000 mg | PREFILLED_SYRINGE | INTRAMUSCULAR | Status: DC
  Administered 2024-08-18 – 2024-08-19 (×2): 40 mg via SUBCUTANEOUS
  Filled 2024-08-18 (×2): qty 0.4

## 2024-08-18 MED ORDER — SENNOSIDES-DOCUSATE SODIUM 8.6-50 MG PO TABS: 1.0000 | ORAL_TABLET | Freq: Every evening | ORAL | Status: DC | PRN

## 2024-08-18 MED ORDER — ACETAMINOPHEN 160 MG/5ML PO SOLN: 650.0000 mg | ORAL | Status: DC | PRN

## 2024-08-18 MED ORDER — SODIUM CHLORIDE 0.9 % IV SOLN: INTRAVENOUS | Status: AC

## 2024-08-18 MED ORDER — SODIUM CHLORIDE 0.9 % IV SOLN: INTRAVENOUS | Status: DC

## 2024-08-18 MED ORDER — MELATONIN 5 MG PO TABS
5.0000 mg | ORAL_TABLET | Freq: Every day | ORAL | Status: DC
  Administered 2024-08-18 – 2024-08-25 (×8): 5 mg via ORAL
  Filled 2024-08-18 (×8): qty 1

## 2024-08-18 MED ORDER — STROKE: EARLY STAGES OF RECOVERY BOOK: Freq: Once | Status: AC

## 2024-08-18 MED ORDER — ASPIRIN 81 MG PO TBEC
81.0000 mg | DELAYED_RELEASE_TABLET | Freq: Every day | ORAL | Status: DC
  Administered 2024-08-18 – 2024-08-26 (×9): 81 mg via ORAL
  Filled 2024-08-18 (×9): qty 1

## 2024-08-18 MED ORDER — APIXABAN 5 MG PO TABS
5.0000 mg | ORAL_TABLET | Freq: Two times a day (BID) | ORAL | Status: DC
  Administered 2024-08-18: 5 mg via ORAL
  Filled 2024-08-18: qty 1

## 2024-08-18 MED ORDER — CLINDAMYCIN PHOSPHATE 600 MG/50ML IV SOLN
600.0000 mg | Freq: Once | INTRAVENOUS | Status: AC
  Administered 2024-08-18: 600 mg via INTRAVENOUS
  Filled 2024-08-18: qty 50

## 2024-08-18 MED ORDER — METHYLPREDNISOLONE SODIUM SUCC 40 MG IJ SOLR
40.0000 mg | Freq: Once | INTRAMUSCULAR | Status: AC
  Administered 2024-08-18: 40 mg via INTRAVENOUS
  Filled 2024-08-18: qty 1

## 2024-08-18 NOTE — ED Triage Notes (Addendum)
 Pt arrives via ems from home. Pt fell from standing position, denies hitting head or LOC. Pt on thinners. Pt reports right side feels funny; pt reports he felt funny and weak since 2pm yesterday.. per ems pt's wife last seen pt normal around 2100 last night before she went to bed, pt did not have slurred speech but wife reports speech is now slurred. Pt reports right arm and right leg weakness that started around 0230 when he woke to go to restroom which caused him to fall. LKN is 2100.  Ems vitals; 128/84 cbg

## 2024-08-18 NOTE — ED Provider Notes (Signed)
 Procedures     ----------------------------------------- 9:10 AM on 08/18/2024 -----------------------------------------   CT angiogram negative for LVO aneurysm or other acute findings other than the previously identified stroke.  Specifically CT of the neck shows no significant findings, no signs of edema or mass or lymphadenopathy.  I suspect that the feeling of swelling is arising from the stroke causing altered perception of existing soft tissue.  Case discussed with the hospitalist   Viviann Pastor, MD 08/18/24 [phone]

## 2024-08-18 NOTE — Progress Notes (Addendum)
 SLP Cancellation Note  Patient Details Name: Terry [name] MRN: [id number] DOB: 12-May-1951   Cancelled treatment:       Reason Eval/Treat Not Completed: Medical issues which prohibited therapy;Patient not medically ready (chart reviewed)  Per chart notes, pt had a repeat STAT Head CT on 12:30pm today revealing: Evidence of mild extension of the left deep white and deep gray matter lacunar infarct since [facility] MRI 0544 hours today. No hemorrhagic transformation or mass effect..   ST services will hold on assessment today as neurological status is still evolving. Pt has only been admitted ~11 hours currently. ST services will f/u tomorrow.      [name], MS, CCC-SLP Speech Language Pathologist Rehab Services; [facility] [phone] (ascom) [name] 08/18/2024, 2:28 PM

## 2024-08-18 NOTE — Evaluation (Signed)
 Physical Therapy Evaluation Patient Details Name: Terry [name] MRN: [id number] DOB: August 31, 1951 Today's Date: 08/18/2024  History of Present Illness  Pt is a 73 y.o. male presented with new onset of right-sided weakness and fall. MRI this AM confirmed lacunar infarct and stat CT completed around 1230pm with evidence of mild extension of the left deep white and deep gray matter lacunar infarct. Cleared by neuro for participation with evaluation. 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  Patient alert and oriented to basic information, follows commands; pleasant and cooperative throughout session.  Very eager and motivated to participate/progress as able.  Denies pain. Notable R facial droop with associated dysarthria; does endorse occasional word-finding difficulties, though easily able to communicate wants/needs and maintain conversation throughout session. Generally flaccid throughout R hemi-body, except trace activation of R hip ext and R hip adduct noted with isolated testing.  Denies sensory deficit (light touch, deep pressure and pain intact) and demonstrates good proprioceptive awareness of R hip/knee joint positions.  Currently requiring max assist +2 for bed mobility; min assist +1 for sitting balance; max assist +2 for sit/stand, standing balance and L LE forward/backward stepping. Requires max/total assist to stabilize R hip/knee in closed-chain positions; manual facilitation to prevent excessive posterior trunk lean and maintain midline in A/P, M/L planes Would benefit from skilled PT to address above deficits and promote optimal return to PLOF.; recommend post-acute PT follow up as indicated by interdisciplinary care  team.          If plan is discharge home, recommend the following: Two people to help with walking and/or transfers;Two people to help with bathing/dressing/bathroom   Can travel by private vehicle        Equipment Recommendations    Recommendations for Other Services       Functional Status Assessment Patient has had a recent decline in their functional status and demonstrates the ability to make significant improvements in function in a reasonable and predictable amount of time.     Precautions / Restrictions Precautions Precautions: Fall Restrictions Weight Bearing Restrictions Per Provider Order: No      Mobility  Bed Mobility Overal bed mobility: Needs Assistance Bed Mobility: Supine to Sit, Sit to Supine     Supine to sit: Max assist, +2 for physical assistance Sit to supine: Max assist, +2 for physical assistance   General bed mobility comments: assist for BLE management and trunk assist d/t R sided hemiplegia and hypotonia-essentially flaccid and requires cues to hold RUE or stabilization from PT/OT to stand and support    Transfers Overall transfer level: Needs assistance Equipment used: 2 person hand held assist Transfers: Sit to/from Stand Sit to Stand: Mod assist, Max assist, +2 physical assistance           General transfer comment: max/total assist to stabilize R hip/knee in closed-chain positions; manual facilitation to prevent excessive posterior trunk lean and maintain midline in A/P, M/L planes    Ambulation/Gait               General Gait Details: unsafe/unable  Stairs            Wheelchair Mobility     Tilt Bed    Modified Rankin (Stroke Patients Only)  Balance Overall balance assessment: Needs assistance Sitting-balance support: No upper extremity supported, Feet supported Sitting balance-Leahy Scale: Poor Sitting balance - Comments: min assist, close periods of cga, delayed righting reactions; frequent  cuing/assist for R lateral LOB   Standing balance support: Bilateral upper extremity supported Standing balance-Leahy Scale: Zero Standing balance comment: Max A x2 to maintain standing balance with HHA on L side and RLE blocking on R side to prevent buckling and support of RUE to prevent injury to shoulder                             Pertinent Vitals/Pain Pain Assessment Pain Assessment: No/denies pain    Home Living Family/patient expects to be discharged to:: Private residence Living Arrangements: Spouse/significant other Available Help at Discharge: Family;Available 24 hours/day Type of Home: House Home Access: Stairs to enter Entrance Stairs-Rails: Right;Left;Can reach both Entrance Stairs-Number of Steps: 4 on front and 8 on the back   Home Layout: One level Home Equipment: Grab bars - tub/shower;Rolling Walker (2 wheels);Cane - single point;BSC/3in1 Additional Comments: utilized a walking stick to hike, walking and going to the gym 3 days/wk    Prior Function Prior Level of Function : Independent/Modified Independent;Driving;History of Falls (last six months)             Mobility Comments: utilized a walking stick to hike, walking and going to the gym 3 days/wk; driving; fall leading to coming in; 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  Sensation intact.  Positive babinski R foot, negative clonus) LLE Deficits / Details: very minimal activation with MMT by PT, no AROM at bed level with OT       Communication   Communication Factors  Affecting Communication: Reduced clarity of speech (mild dysarthria due to R facial droop)    Cognition Arousal: Alert Behavior During Therapy: WFL for tasks assessed/performed   PT - Cognitive impairments: No apparent impairments                       PT - Cognition Comments: very engaged and motivated to participate/progress Following commands: Intact       Cueing Cueing Techniques: Verbal cues     General Comments General comments (skin integrity, edema, etc.): beats of vtach during session as high at 174 after standing but quickly resolved to low 100s    Exercises Other Exercises Other Exercises: L LE forward/backward stepping at edge of stretcher surface, max assist +2 for safety.  Total assist to stabilize R hip/knee in loading phases of gait; does identify when joints unstable, but unable to initiate correction   Assessment/Plan    PT Assessment Patient needs continued PT services  PT Problem List Decreased strength;Decreased range of motion;Decreased activity tolerance;Decreased balance;Decreased mobility;Decreased coordination;Decreased knowledge of use of DME;Decreased safety awareness;Decreased knowledge of precautions;Impaired tone       PT Treatment Interventions DME instruction;Gait training;Stair training;Functional mobility training;Therapeutic exercise;Therapeutic activities;Patient/family education;Neuromuscular re-education;Balance training    PT Goals (Current goals can be found in the Care Plan section)  Acute Rehab PT Goals Patient Stated Goal: to get as much back as i can PT Goal Formulation: With patient/family Time For Goal Achievement: 09/01/24 Potential to Achieve Goals: Good    Frequency Min 3X/week     Co-evaluation PT/OT/SLP Co-Evaluation/Treatment: Yes Reason for Co-Treatment: Complexity of the patient's impairments (multi-system involvement);For patient/therapist safety;To address functional/ADL transfers PT goals addressed during  session: Mobility/safety with mobility OT goals addressed during session: Strengthening/ROM       AM-PAC PT 6 Clicks Mobility  Outcome Measure Help needed turning from your back to your side while in a flat bed without using bedrails?: A Lot Help needed moving from lying on your back to sitting on the side of a flat bed without using bedrails?: A Lot Help needed moving to and from a bed to a chair (including a wheelchair)?: A Lot Help needed standing up from a chair using your arms (e.g., wheelchair or bedside chair)?: A Lot Help needed to walk in hospital room?: Total Help needed climbing 3-5 steps with a railing? : Total 6 Click Score: 10    End of Session Equipment Utilized During Treatment: Gait belt Activity Tolerance: Patient tolerated treatment well Patient left: in bed;with call bell/phone within reach;with bed alarm set Nurse Communication: Mobility status PT Visit Diagnosis: Hemiplegia and hemiparesis Hemiplegia - Right/Left: Right Hemiplegia - dominant/non-dominant: Dominant Hemiplegia - caused by: Cerebral infarction    Time: 8454-8381 PT Time Calculation (min) (ACUTE ONLY): 33 min   Charges:   PT Evaluation $PT Eval Moderate Complexity: 1 Mod   PT General Charges $$ ACUTE PT VISIT: 1 Visit         Dao Memmott H. Delores, PT, DPT, NCS 08/18/24, 5:19 PM [phone]

## 2024-08-18 NOTE — H&P (Signed)
 History and Physical    DEANE WATTENBARGER FMW:[id number] DOB: April 02, 1951 DOA: 08/18/2024  PCP: [name], MD (Confirm with patient/family/NH records and if not entered, this has to be entered at [facility] point of entry) Patient coming from: Home  I have personally briefly reviewed patient's old medical records in [facility]  Chief Complaint: Right sided weakness  HPI: GEMAYEL MASCIO is a 73 y.o. 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  Patient started to feel mild weakness on the right hand yesterday  right arm appears to be sleeping he brought attributed to possible pressure palsy.  But this morning, patient woke up after midnight around 230 and this time he developed both significant weakness of the right arm and right leg.  Patient was last known normal around 9 PM yesterday.  He takes Eliquis  for A-fib for last 6 years and has had 1 stroke in 2022 and 2 mini strokes in 2023 and 2024.  ED Course: Afebrile, no tachycardia no hypotension.  MRI showed right lacunar stroke in the right corona radiata.  CTA negative for LVO.  CTs of the tissue of neck negative for mass or infection or inflammation.  Blood work showed creatinine 1.5 BUN 16 glucose 122 hemoglobin 13.7 WBC 7.8.  Review of Systems: As per HPI otherwise 14 point review of systems negative.    Past Medical History:  Diagnosis Date   Diverticulosis    GERD (gastroesophageal reflux disease)    Heart murmur    Hypercholesteremia    Hypertension    Mitral valve prolapse    Stroke [facility])     Past Surgical History:  Procedure Laterality Date   COLONOSCOPY WITH PROPOFOL  N/A 03/06/2020   Procedure: COLONOSCOPY WITH PROPOFOL ;  Surgeon: Toledo, Ladell MARLA, MD;  Location: [facility] ENDOSCOPY;  Service: Gastroenterology;  Laterality: N/A;    MITRAL VALVE REPLACEMENT     RETINAL DETACHMENT SURGERY     RIGHT/LEFT HEART CATH AND CORONARY ANGIOGRAPHY Bilateral 02/18/2017   Procedure: Right/Left Heart Cath and Coronary Angiography;  Surgeon: Marsa Dooms, MD;  Location: [facility] INVASIVE CV LAB;  Service: Cardiovascular;  Laterality: Bilateral;   RIGHT/LEFT HEART CATH AND CORONARY ANGIOGRAPHY N/A 02/22/2020   Procedure: RIGHT/LEFT HEART CATH AND CORONARY ANGIOGRAPHY;  Surgeon: Dooms Marsa, MD;  Location: [facility] INVASIVE CV LAB;  Service: Cardiovascular;  Laterality: N/A;   TEMPOROMANDIBULAR JOINT SURGERY       reports that he quit smoking about 40 years ago. His smoking use included cigarettes. He started smoking about 60 years ago. He has a 30 pack-year smoking history. He has never used smokeless tobacco. He reports current alcohol use. He reports that he does not use drugs.  Allergies  Allergen Reactions   Penicillins Shortness Of Breath    Did it involve swelling of the face/tongue/throat, SOB, or low BP? Yes Did it involve sudden or severe rash/hives, skin peeling, or any reaction on the inside of your mouth or nose? Unknown Did you need to seek medical attention at a hospital or doctor's office? Was at a clinic when reaction occurred When did it last happen? More than 50 years ago If all above answers are "NO", may proceed with cephalosporin use.    Ciprofloxacin Swelling and Rash   Ivp Dye [Iodinated Contrast Media] Rash    Family History  Problem Relation  Age of Onset   Varicose Veins Mother    Cancer Father     Prior to Admission medications   Medication Sig Start Date End Date Taking? Authorizing Provider  apixaban  (ELIQUIS ) 5 MG TABS tablet Take 1 tablet (5 mg total) by mouth 2 (two) times daily. 05/09/20   Maree Hue, MD  aspirin  EC 81 MG EC tablet Take 1 tablet (81 mg total) by mouth daily. 05/11/20   Maree Hue, MD  atorvastatin  (LIPITOR) 80 MG tablet Take 1 tablet (80 mg total) by mouth daily.  04/03/21 04/03/22  Rojelio Nest, DO  cholecalciferol (VITAMIN D3) 25 MCG (1000 UNIT) tablet Take 1,000 Units by mouth daily.    [provider]  clobetasol  ointment (TEMOVATE ) 0.05 % Apply 1 application topically daily as needed (skin irritation).  01/07/17   [provider]  Multiple Vitamin (MULTIVITAMIN WITH MINERALS) TABS tablet Take 1 tablet by mouth at bedtime.    [provider]  omeprazole (PRILOSEC) 20 MG capsule Take 20 mg by mouth every evening.  01/11/17   [provider]  triamcinolone cream (KENALOG) 0.1 % Apply 1 application topically daily as needed (skin irritation.).  01/11/17   [provider]  vitamin C (ASCORBIC ACID) 250 MG tablet Take 250 mg by mouth daily.    [provider]    Physical Exam: Vitals:   08/18/24 0321 08/18/24 0330 08/18/24 0400 08/18/24 0800  BP:  126/60 123/75 120/70  Pulse:  84 82 87  Resp:  12 16 15   Temp:    98 F (36.7 C)  TempSrc:      SpO2:  97% 95% 97%  Weight: 82 kg     Height: 5' 7 (1.702 m)       Constitutional: NAD, calm, comfortable Vitals:   08/18/24 0321 08/18/24 0330 08/18/24 0400 08/18/24 0800  BP:  126/60 123/75 120/70  Pulse:  84 82 87  Resp:  12 16 15   Temp:    98 F (36.7 C)  TempSrc:      SpO2:  97% 95% 97%  Weight: 82 kg     Height: 5' 7 (1.702 m)      Eyes: PERRL, lids and conjunctivae normal ENMT: Mucous membranes are moist. Posterior pharynx clear of any exudate or lesions.Normal dentition.  Neck: normal, supple, no masses, no thyromegaly Respiratory: clear to auscultation bilaterally, no wheezing, no crackles. Normal respiratory effort. No accessory muscle use.  Cardiovascular: Regular rate and rhythm, no murmurs / rubs / gallops. No extremity edema. 2+ pedal pulses. No carotid bruits.  Abdomen: no tenderness, no masses palpated. No hepatosplenomegaly. Bowel sounds positive.  Musculoskeletal: no clubbing / cyanosis. No joint deformity upper and lower  extremities. Good ROM, no contractures. Normal muscle tone.  Skin: no rashes, lesions, ulcers. No induration Neurologic: CN 2-12 grossly intact. Sensation intact, DTR normal. Strength 4/5 in right arm, hand and compared to 5/5 on the left side.  Psychiatric: Normal judgment and insight. Alert and oriented x 3. Normal mood.     Labs on Admission: I have personally reviewed following labs and imaging studies  CBC: Recent Labs  Lab 08/18/24 0321  WBC 7.8  NEUTROABS 5.9  HGB 13.7  HCT 39.3  MCV 99.2  PLT 176   Basic Metabolic Panel: Recent Labs  Lab 08/18/24 0321 08/18/24 0341  NA 142  --   K 4.4  --   CL 107  --   CO2 25  --   GLUCOSE 122*  --  BUN 16  --   CREATININE 1.53*  --   CALCIUM  9.4  --   MG  --  1.9   GFR: Estimated Creatinine Clearance: 43.4 mL/min (A) (by C-G formula based on SCr of 1.53 mg/dL (H)). Liver Function Tests: Recent Labs  Lab 08/18/24 0321  AST 24  ALT 20  ALKPHOS 80  BILITOT 0.8  PROT 6.8  ALBUMIN 3.9   No results for input(s): LIPASE, AMYLASE in the last 168 hours. No results for input(s): AMMONIA in the last 168 hours. Coagulation Profile: Recent Labs  Lab 08/18/24 0321  INR 1.3*   Cardiac Enzymes: No results for input(s): CKTOTAL, CKMB, CKMBINDEX, TROPONINI in the last 168 hours. BNP (last 3 results) No results for input(s): PROBNP in the last 8760 hours. HbA1C: No results for input(s): HGBA1C in the last 72 hours. CBG: No results for input(s): GLUCAP in the last 168 hours. Lipid Profile: No results for input(s): CHOL, HDL, LDLCALC, TRIG, CHOLHDL, LDLDIRECT in the last 72 hours. Thyroid Function Tests: Recent Labs    08/18/24 0341  TSH 3.956  FREET4 0.91   Anemia Panel: No results for input(s): VITAMINB12, FOLATE, FERRITIN, TIBC, IRON, RETICCTPCT in the last 72 hours. Urine analysis:    Component Value Date/Time   COLORURINE YELLOW (A) 08/18/2024 0651   APPEARANCEUR  CLEAR (A) 08/18/2024 0651   LABSPEC 1.008 08/18/2024 0651   PHURINE 6.0 08/18/2024 0651   GLUCOSEU NEGATIVE 08/18/2024 0651   HGBUR NEGATIVE 08/18/2024 0651   BILIRUBINUR NEGATIVE 08/18/2024 0651   KETONESUR NEGATIVE 08/18/2024 0651   PROTEINUR NEGATIVE 08/18/2024 0651   NITRITE NEGATIVE 08/18/2024 0651   LEUKOCYTESUR NEGATIVE 08/18/2024 0651    Radiological Exams on Admission: CT Soft Tissue Neck W Contrast Result Date: 08/18/2024 CLINICAL DATA:  Anterior neck swelling and throat pain EXAM: CT NECK WITH CONTRAST TECHNIQUE: Multidetector CT imaging of the neck was performed using the standard protocol following the bolus administration of intravenous contrast. RADIATION DOSE REDUCTION: This exam was performed according to the departmental dose-optimization program which includes automated exposure control, adjustment of the mA and/or kV according to patient size and/or use of iterative reconstruction technique. CONTRAST:  75mL OMNIPAQUE  IOHEXOL  350 MG/ML SOLN COMPARISON:  CT angiography same day FINDINGS: Pharynx and larynx: This examination suffers from some motion degradation. No evidence of mucosal or submucosal mass or inflammatory disease. Salivary glands: Parotid and submandibular glands are normal. Thyroid: Normal. Lymph nodes: No lymphadenopathy on either side of the neck. Normal cervical chain nodes. Vascular: Atherosclerotic calcification at the carotid bifurcations as evaluated by CT angiography. Limited intracranial: Negative Visualized orbits: Normal Mastoids and visualized paranasal sinuses: Clear Skeleton: Ordinary spondylosis and facet arthritis. Upper chest: Clear Other: No evidence of superficial soft tissue edema, inflammation or mass. No abnormality seen to explain the clinical concern. IMPRESSION: 1. No abnormality seen to explain the clinical concern. No evidence of mass or lymphadenopathy. 2. Atherosclerotic calcification at the carotid bifurcations as evaluated by CT  angiography. 3. Ordinary spondylosis and facet arthritis of the cervical spine. Electronically Signed   By: Oneil Officer M.D.   On: 08/18/2024 08:20   CT ANGIO HEAD NECK W WO CM Result Date: 08/18/2024 CLINICAL DATA:  Neuro deficit, acute, stroke suspected. Anterior neck swelling and throat pain. Fall. Acute small vessel infarction of the left brain. EXAM: CT ANGIOGRAPHY HEAD AND NECK WITH AND WITHOUT CONTRAST TECHNIQUE: Multidetector CT imaging of the head and neck was performed using the standard protocol during bolus administration of intravenous contrast. Multiplanar  CT image reconstructions and MIPs were obtained to evaluate the vascular anatomy. Carotid stenosis measurements (when applicable) are obtained utilizing NASCET criteria, using the distal internal carotid diameter as the denominator. RADIATION DOSE REDUCTION: This exam was performed according to the departmental dose-optimization program which includes automated exposure control, adjustment of the mA and/or kV according to patient size and/or use of iterative reconstruction technique. CONTRAST:  75mL OMNIPAQUE  IOHEXOL  350 MG/ML SOLN COMPARISON:  Head CT and brain MRI earlier same day FINDINGS: CT HEAD FINDINGS Brain: No focal abnormality seen affecting the brainstem or cerebellum. Old infarction in the left frontal operculum. Acute infarction within the left external capsule and body of the caudate visible as subtle low density. No evidence of hemorrhagic transformation or mass effect. No hydrocephalus. No extra-axial collection. Vascular: There is atherosclerotic calcification of the major vessels at the base of the brain. Skull: Negative Sinuses/Orbits: Clear/normal Other: None Review of the MIP images confirms the above findings CTA NECK FINDINGS Aortic arch: Aortic atherosclerosis. Branching pattern is normal without origin stenosis. Right carotid system: Common carotid artery widely patent to the bifurcation. Calcified plaque at the carotid  bifurcation and ICA bulb but no stenosis when compared to the more distal cervical ICA diameter. Left carotid system: Common carotid artery widely patent to the bifurcation. Calcified plaque at the carotid bifurcation and ICA bulb but no stenosis. Cervical ICA widely patent beyond that Vertebral arteries: No proximal subclavian stenosis. Both vertebral artery origins are widely patent. Calcified plaque adjacent to the right vertebral artery origin. Both vertebral arteries are patent through the cervical region without stenosis. Skeleton: Ordinary spondylosis C5-6. Mild facet osteoarthritis right worse than left. Other neck: See results of neck CT. Upper chest: Minimal emphysema in the upper lobes. Review of the MIP images confirms the above findings CTA HEAD FINDINGS Anterior circulation: Both internal carotid arteries are patent through the skull base and siphon regions. Mild siphon atherosclerotic calcification but no stenosis. The anterior and middle cerebral vessels are patent. No large vessel occlusion or proximal stenosis. Some motion degradation towards the vertex. Posterior circulation: Both vertebral arteries are patent through the foramen magnum to the basilar artery. No basilar stenosis. Posterior circulation branch vessels are patent. Venous sinuses: Patent and normal. Anatomic variants: None significant. Review of the MIP images confirms the above findings IMPRESSION: 1. No intracranial large vessel occlusion or proximal stenosis. 2. Acute infarction in the left external capsule and body of the caudate visible as subtle low density. No evidence of hemorrhagic transformation or mass effect. 3. Old infarction in the left frontal operculum. 4. Atherosclerotic calcification at the carotid bifurcations and ICA bulbs but no stenosis. Aortic Atherosclerosis (ICD10-I70.0). Electronically Signed   By: Oneil Officer M.D.   On: 08/18/2024 08:17   MR BRAIN WO CONTRAST Result Date: 08/18/2024 CLINICAL DATA:   73 year old male status post fall. Neurologic deficit. EXAM: MRI HEAD WITHOUT CONTRAST TECHNIQUE: Multiplanar, multiecho pulse sequences of the brain and surrounding structures were obtained without intravenous contrast. COMPARISON:  Head CT 0343 hours today.  Brain MRI 04/01/2021. FINDINGS: Brain: Patchy chronic left MCA middle division cortical encephalomalacia, primarily affecting the operculum, stable since 2022. Chronic lacunar infarcts in the bilateral caudate nuclei are stable since 2022. Evolution of left thalamic lacunar infarct since the previous MRI. Multiple small bilateral cerebellar infarcts are new or increased since 2022. small chronic right inferior parietal lobe cortical encephalomalacia is stable. Patchy acute lacunar type infarct tracking from the posterior left corona radiata into the left lentiform (series 5  images 31-27. mild T2 and FLAIR hyperintensity. No hemorrhage or mass effect. No other diffusion restriction. No midline shift, mass effect, evidence of mass lesion, ventriculomegaly, extra-axial collection or acute intracranial hemorrhage. Cervicomedullary junction and pituitary are within normal limits. Vascular: Major intracranial vascular flow voids are stable since 2022. Skull and upper cervical spine: Chronic lacunar infarcts in the bilateral caudate nuclei are stable since 2022. Evolution of left thalamic lacunar infarct since the previous MRI. Small bilateral cerebellar infarcts are new or increased since 2022. Patchy acute lacunar type infarct tracking from the posterior left corona radiata into the left lentiform (series 5 images 31-27. Sinuses/Orbits: Negative. Visualized bone marrow signal is within normal limits. Other: Stable, negative. IMPRESSION: 1. Acute lacunar infarct tracking from the posterior left corona radiata into the left lentiform. No associated hemorrhage or mass effect. 2. Underlying chronic small and medium-sized vessel ischemia, including a previous left MCA  territory operculum infarct. 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 Electronically Signed   By: VEAR Hurst M.D.   On: 08/18/2024 06:51   CT HEAD WO CONTRAST ( ) Result Date: 08/18/2024 CLINICAL DATA:  Neuro deficit. Fell from standing. Right-side feels funny. Weakness. EXAM: CT HEAD WITHOUT CONTRAST TECHNIQUE: Contiguous axial images were obtained from the base of the skull through the vertex without intravenous contrast. RADIATION DOSE REDUCTION: This exam was performed according to the departmental dose-optimization program which includes automated exposure control, adjustment of the mA and/or kV according to patient size and/or use of iterative reconstruction technique. COMPARISON:  CT 04/01/2021 FINDINGS: Brain: No intracranial hemorrhage, mass effect, or evidence of acute infarct. No hydrocephalus. No extra-axial fluid collection. Chronic left frontal infarct. Vascular: No hyperdense vessel or unexpected calcification. Skull: No fracture or focal lesion. Sinuses/Orbits: No acute finding. Other: None. IMPRESSION: No acute intracranial abnormality. Electronically Signed   By: Norman Gatlin M.D.   On: 08/18/2024 03:56    EKG: Independently reviewed.  Sinus rhythm, no acute ST changes.  Assessment/Plan Active Problems:   Acute CVA (cerebrovascular accident) [facility])   CVA (cerebral vascular accident) (HCC)  (please populate well all problems here in Problem List. (For example, if patient is on BP meds at home and you resume or decide to hold them, it is a problem that needs to be her. Same for CAD, COPD, HLD and so on)  Right-sided lacunar stroke Right-sided paresis History of stroke with residual aphasia - Continue telemonitoring for 24 hours - Risk factor modification, check A1c and lipid panel - Allow permissive hypertension - PT OT evaluation - Discussed with neurology regarding whether this occasion of stroke will be considered as a Eliquis  treatment failure  as patient has had 4 other stroke/mini strokes in the last 5 years while on Eliquis  therapy.  Neurology will see the patient and give us  feedback. - For now, continue aspirin  and Eliquis   AKI versus CKD - Appears to euvolemic - Will check UA, renal ultrasound and FeNa, will give short course of IV fluid for kidney protection as patient received IV contrast today   PAF - Rate controlled - Continue Eliquis   History of mitral prolapse status post fall bovine mitral valve replacement - No murmurs heard - Echocardiogram ordered  DVT prophylaxis: Eliquis  Code Status: Full code Family Communication: Wife and son at bedside Disposition Plan: Expect less than 2 midnight hospital stay Consults called: Neurology Admission status: Telemetry observation   Cort ONEIDA Mana MD [facility] Pager [phone]  08/18/2024, 9:36 AM

## 2024-08-18 NOTE — ED Notes (Signed)
 Patient transported to MRI

## 2024-08-18 NOTE — ED Notes (Addendum)
 RN notified Dr. Laurita and neurologist. Per Neuro, place stat head CT.

## 2024-08-18 NOTE — Evaluation (Signed)
 Occupational Therapy Evaluation Patient Details Name: Terry [name] MRN: [id number] DOB: 1951/04/16 Today's Date: 08/18/2024   History of Present Illness   Pt is a 73 y.o. male presented with new onset of right-sided weakness and fall. MRI this AM confirmed lacunar infarct and stat CT completed around 1230pm with evidence of mild extension of the left deep white and deep gray matter lacunar infarct. PMH of PAF on Eliquis , multiple strokes in the last 5 years with residual aphasia, gait imbalance and memory issues, HTN, HLD, mitral valve prolapse status post bovine mitral valve replacement     Clinical Impressions Pt was seen for OT evaluation this date. PTA, pt resides in a one level home with his wife with 4 STE and bil HR. Pt is IND with ADL performance and mobility at baseline. Reports he goes to the gym 3x/wk, hikes with a walking stick, walks daily and is able to drive. He does light yard work tasks like picking up limbs and and some gardening.   Pt presents with deficits in RUE and RLE ROM/tone, strength, balance and activity tolerance, affecting safe and optimal ADL completion. He is alert and oriented x4. R facial droop noted and decreased clarity of speech, although when repeats or slows down his talking he is able to understood. Pt is R hand dominant and has no AROM to either RUE or RLE at this time with minimal to no activation. He required +2 assist for all bed mobility and STS transfers from high ED stretcher x2 during session with ability to step forward on LLE. RLE was blocked to prevent buckling during standing and RUE was supported to protect shoulder joint d/t severe hemiplegia/hypotonia-elevated on pillow in bed at end of session. Unable to take lateral steps or ambulate this session. 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 He is highly motivated to improve his function and regain IND. Pt would  benefit from skilled OT services to address noted impairments and functional limitations to maximize safety and independence while minimizing future risk of falls, injury, and readmission. Do anticipate the need for follow up OT services upon acute hospital DC.      If plan is discharge home, recommend the following:   A lot of help with bathing/dressing/bathroom;Two people to help with walking and/or transfers;Help with stairs or ramp for entrance;Assistance with cooking/housework     Functional Status Assessment   Patient has had a recent decline in their functional status and demonstrates the ability to make significant improvements in function in a reasonable and predictable amount of time.     Equipment Recommendations   Other (comment) (defer to next venue)     Recommendations for Other Services   Rehab consult     Precautions/Restrictions         Mobility Bed Mobility Overal bed mobility: Needs Assistance Bed Mobility: Supine to Sit, Sit to Supine     Supine to sit: Max assist, +2 for physical assistance, Mod assist Sit to supine: Max assist, +2 for physical assistance   General bed mobility comments: assist for BLE management and trunk assist d/t R sided hemiplegia and hypotonia-essentially flaccid and requires cues to hold RUE or stabilization from PT/OT to stand and support    Transfers Overall transfer level: Needs assistance Equipment used: 1 person hand held assist Transfers: Sit to/from Stand Sit to Stand: Mod assist, +2 safety/equipment, From elevated surface, +2  physical assistance, Max assist           General transfer comment: from high ED stretcher, more assist to maintain standing balance than lift from EOB, blocking of RLE and knee to prevent buckling from PT; OT provided Max A to maintain standing position as pt also has posterior lean      Balance Overall balance assessment: Needs assistance Sitting-balance support: Feet supported,  Single extremity supported Sitting balance-Leahy Scale: Poor Sitting balance - Comments: short periods of CGA, but requiring cues for correcting posture as he would lean posterior and to the R side, also noted with anterior lean at times Postural control: Posterior lean, Right lateral lean Standing balance support: Single extremity supported Standing balance-Leahy Scale: Zero Standing balance comment: Max A x2 to maintain standing balance with HHA on L side and RLE blocking on R side to prevent buckling and support of RUE to prevent injury to shoulder                           ADL either performed or assessed with clinical judgement   ADL Overall ADL's : Needs assistance/impaired                     Lower Body Dressing: Maximal assistance;Moderate assistance;Bed level Lower Body Dressing Details (indicate cue type and reason): to manage/adjust pants in bed               General ADL Comments: anticipate Mod A for ADL management d/t R sided hemiplegia     Vision         Perception         Praxis         Pertinent Vitals/Pain Pain Assessment Pain Assessment: No/denies pain     Extremity/Trunk Assessment Upper Extremity Assessment Upper Extremity Assessment: Right hand dominant;RUE deficits/detail RUE Deficits / Details: no AROM, PROM intact   Lower Extremity Assessment Lower Extremity Assessment: Defer to PT evaluation;LLE deficits/detail LLE Deficits / Details: very minimal activation with MMT by PT, no AROM at bed level with OT       Communication Communication Factors Affecting Communication: Reduced clarity of speech   Cognition Arousal: Alert Behavior During Therapy: WFL for tasks assessed/performed Cognition: No apparent impairments             OT - Cognition Comments: mild memory deficits from previous CVA                 Following commands: Intact       Cueing  General Comments   Cueing Techniques: Verbal  cues  beats of vtach during session as high at 174 after standing but quickly resolved to low 100s   Exercises Other Exercises Other Exercises: Edu on role of OT in acute setting and DC recommendations. Edu on protecting his RUE by both PT/OT with elevation and good support to prevent shoulder injury d/t hypotonia.   Shoulder Instructions      Home Living Family/patient expects to be discharged to:: Private residence Living Arrangements: Spouse/significant other Available Help at Discharge: Family;Available 24 hours/day Type of Home: House Home Access: Stairs to enter [employer] of Steps: 4 on front and 8 on the back Entrance Stairs-Rails: Right;Left;Can reach both Home Layout: One level     Bathroom Shower/Tub: Chief Strategy Officer: Handicapped height     Home Equipment: Grab bars - tub/shower;Rolling Environmental consultant (2 wheels);Cane - single point;BSC/3in1   Additional  Comments: utilized a walking stick to hike, walking and going to the gym 3 days/wk      Prior Functioning/Environment Prior Level of Function : Independent/Modified Independent;Driving;History of Falls (last six months)             Mobility Comments: utilized a walking stick to hike, walking and going to the gym 3 days/wk; driving; fall leading to coming in; neuropathy in feet causing mild imbalance ADLs Comments: minimal gardening and picking up limbds, has someone else doing mowing/weed eating; IND with ADLs    OT Problem List: Decreased strength;Decreased coordination;Decreased range of motion;Decreased activity tolerance;Impaired tone;Impaired balance (sitting and/or standing);Impaired UE functional use   OT Treatment/Interventions: Self-care/ADL training;Therapeutic exercise;Therapeutic activities;Neuromuscular education;Patient/family education;DME and/or AE instruction;Balance training      OT Goals(Current goals can be found in the care plan section)   Acute Rehab OT  Goals Patient Stated Goal: get better OT Goal Formulation: With patient/family Time For Goal Achievement: 09/01/24 Potential to Achieve Goals: Fair ADL Goals Pt Will Perform Grooming: sitting;with min assist Pt Will Transfer to Toilet: with mod assist;stand pivot transfer;bedside commode Additional ADL Goal #1: Pt will demo ability to perform RUE PROM HEP with independence to maximize tone, ROM and strength and hopefully increase functional use   OT Frequency:  Min 3X/week    Co-evaluation PT/OT/SLP Co-Evaluation/Treatment: Yes Reason for Co-Treatment: Complexity of the patient's impairments (multi-system involvement);For patient/therapist safety;To address functional/ADL transfers PT goals addressed during session: Mobility/safety with mobility OT goals addressed during session: Strengthening/ROM      AM-PAC OT 6 Clicks Daily Activity     Outcome Measure Help from another person eating meals?: A Little Help from another person taking care of personal grooming?: A Lot Help from another person toileting, which includes using toliet, bedpan, or urinal?: A Lot Help from another person bathing (including washing, rinsing, drying)?: A Lot Help from another person to put on and taking off regular upper body clothing?: A Lot Help from another person to put on and taking off regular lower body clothing?: A Lot 6 Click Score: 13   End of Session Equipment Utilized During Treatment: Gait belt Nurse Communication: Mobility status  Activity Tolerance: Patient tolerated treatment well;Patient limited by fatigue Patient left: in bed;with call bell/phone within reach;with family/visitor present  OT Visit Diagnosis: Other abnormalities of gait and mobility (R26.89);Unsteadiness on feet (R26.81);Muscle weakness (generalized) (M62.81);Hemiplegia and hemiparesis Hemiplegia - Right/Left: Right Hemiplegia - dominant/non-dominant: Dominant Hemiplegia - caused by: Cerebral infarction                 Time: 8472-8383 OT Time Calculation (min): 49 min Charges:  OT General Charges $OT Visit: 1 Visit OT Evaluation $OT Eval Moderate Complexity: 1 Mod OT Treatments $Therapeutic Activity: 8-22 mins Lotus Gover, OTR/L 08/18/24, 4:48 PM  Arlenne Kimbley E Deola Rewis 08/18/2024, 4:48 PM

## 2024-08-18 NOTE — Progress Notes (Signed)
  Inpatient Rehab Admissions Coordinator :  Per therapy recommendations, patient was screened for CIR candidacy by Ottie Glazier RN MSN.  At this time patient appears to be a potential candidate for CIR. I will place a rehab consult per protocol for full assessment. Please call me with any questions.  Ottie Glazier RN MSN Admissions Coordinator [phone]

## 2024-08-18 NOTE — Consult Note (Signed)
 NEUROLOGY CONSULT NOTE   Date of service: August 18, 2024 Patient Name: Terry [name] MRN:  [id number] DOB:  08-26-1951 Chief Complaint: Right-sided weakness Requesting Provider: Laurita Cort DASEN, MD  History of Present Illness  Terry [name] is a 73 y.o. male with hx of paroxysmal atrial fibrillation on Eliquis  compliant to medications, Essential hypertension, CAD,mitral valve prolapse status post mitral valve replacement in 2021, prior multiple left hemispheric strokes-involving the left MCA, left PCA and left ACA territories, with ongoing gait imbalance and memory issues as well as mild expressive aphasia, presents for evaluation of sudden onset of right-sided weakness, which she noted upon waking up around 230 or 3 AM today.  Last known well is 9 PM when he went to bed at his neurological baseline. Upon waking up, he could not move his right arm and his right leg also felt weak.  LKW: 9 PM 08/17/2024 Modified rankin score: 2-Slight disability-UNABLE to perform all activities but does not need assistance IV Thrombolysis: Not offered-presented outside the window EVT: No ELVO signs on exam  NIHSS components Score: Comment  1a Level of Conscious 0[x]  1[]  2[]  3[]      1b LOC Questions 0[x]  1[]  2[]       1c LOC Commands 0[x]  1[]  2[]       2 Best Gaze 0[x]  1[]  2[]       3 Visual 0[x]  1[]  2[]  3[]      4 Facial Palsy 0[]  1[x]  2[]  3[]      5a Motor Arm - left 0[x]  1[]  2[]  3[]  4[]  UN[]    5b Motor Arm - Right 0[]  1[]  2[]  3[x]  4[]  UN[]    6a Motor Leg - Left 0[x]  1[]  2[]  3[]  4[]  UN[]    6b Motor Leg - Right 0[]  1[]  2[x]  3[]  4[]  UN[]    7 Limb Ataxia 0[x]  1[]  2[]  UN[]      8 Sensory 0[x]  1[]  2[]  UN[]      9 Best Language 0[]  1[x]  2[]  3[]      10 Dysarthria 0[]  1[x]  2[]  UN[]      11 Extinct. and Inattention 0[x]  1[]  2[]       TOTAL: 8      ROS  Comprehensive ROS performed and pertinent positives documented in HPI   Past History   Past Medical History:  Diagnosis Date   Diverticulosis    GERD  (gastroesophageal reflux disease)    Heart murmur    Hypercholesteremia    Hypertension    Mitral valve prolapse    Stroke [facility])     Past Surgical History:  Procedure Laterality Date   COLONOSCOPY WITH PROPOFOL  N/A 03/06/2020   Procedure: COLONOSCOPY WITH PROPOFOL ;  Surgeon: Toledo, Ladell MARLA, MD;  Location: [facility] ENDOSCOPY;  Service: Gastroenterology;  Laterality: N/A;   MITRAL VALVE REPLACEMENT     RETINAL DETACHMENT SURGERY     RIGHT/LEFT HEART CATH AND CORONARY ANGIOGRAPHY Bilateral 02/18/2017   Procedure: Right/Left Heart Cath and Coronary Angiography;  Surgeon: Marsa Dooms, MD;  Location: [facility] INVASIVE CV LAB;  Service: Cardiovascular;  Laterality: Bilateral;   RIGHT/LEFT HEART CATH AND CORONARY ANGIOGRAPHY N/A 02/22/2020   Procedure: RIGHT/LEFT HEART CATH AND CORONARY ANGIOGRAPHY;  Surgeon: Dooms Marsa, MD;  Location: [facility] INVASIVE CV LAB;  Service: Cardiovascular;  Laterality: N/A;   TEMPOROMANDIBULAR JOINT SURGERY      Family History: Family History  Problem Relation Age of Onset   Varicose Veins Mother    Cancer Father     Social History  reports that he quit smoking about 40 years ago. His smoking use  included cigarettes. He started smoking about 60 years ago. He has a 30 pack-year smoking history. He has never used smokeless tobacco. He reports current alcohol use. He reports that he does not use drugs.  Allergies  Allergen Reactions   Penicillins Shortness Of Breath    Did it involve swelling of the face/tongue/throat, SOB, or low BP? Yes Did it involve sudden or severe rash/hives, skin peeling, or any reaction on the inside of your mouth or nose? Unknown Did you need to seek medical attention at a hospital or doctor's office? Was at a clinic when reaction occurred When did it last happen? More than 50 years ago If all above answers are "NO", may proceed with cephalosporin use.    Ciprofloxacin Swelling and Rash   Ivp Dye [Iodinated Contrast Media]  Rash    Medications   Current Facility-Administered Medications:    [START ON 08/19/2024]  stroke: early stages of recovery book, , Does not apply, Once, Laurita Manor T, MD   0.9 %  sodium chloride  infusion, , Intravenous, Continuous, Laurita Manor T, MD, Last Rate: 100 mL/hr at 08/18/24 1010, New Bag at 08/18/24 1010   acetaminophen  (TYLENOL ) tablet 650 mg, 650 mg, Oral, Q4H PRN **OR** acetaminophen  (TYLENOL ) 160 MG/5ML solution 650 mg, 650 mg, Per Tube, Q4H PRN **OR** acetaminophen  (TYLENOL ) suppository 650 mg, 650 mg, Rectal, Q4H PRN, Laurita Manor T, MD   apixaban  (ELIQUIS ) tablet 5 mg, 5 mg, Oral, BID, Zhang, Ping T, MD, 5 mg at 08/18/24 1010   aspirin  EC tablet 81 mg, 81 mg, Oral, Daily, Laurita, Ping T, MD, 81 mg at 08/18/24 1010   atorvastatin  (LIPITOR) tablet 80 mg, 80 mg, Oral, QHS, Zhang, Manor T, MD   pantoprazole  (PROTONIX ) EC tablet 40 mg, 40 mg, Oral, Daily, Laurita, Ping T, MD, 40 mg at 08/18/24 1010   senna-docusate (Senokot-S) tablet 1 tablet, 1 tablet, Oral, QHS PRN, Laurita Manor DASEN, MD  Current Outpatient Medications:    donepezil (ARICEPT) 5 MG tablet, Take 5 mg by mouth at bedtime., Disp: , Rfl:    nortriptyline (PAMELOR) 50 MG capsule, Take 50 mg by mouth at bedtime., Disp: , Rfl:    promethazine (PHENERGAN) 25 MG tablet, Take 25 mg by mouth every 6 (six) hours as needed for nausea., Disp: , Rfl:    valACYclovir  (VALTREX ) 500 MG tablet, Take 500 mg by mouth at bedtime., Disp: , Rfl:    apixaban  (ELIQUIS ) 5 MG TABS tablet, Take 1 tablet (5 mg total) by mouth 2 (two) times daily., Disp: 60 tablet, Rfl: 0   aspirin  EC 81 MG EC tablet, Take 1 tablet (81 mg total) by mouth daily., Disp: 30 tablet, Rfl: 0   atorvastatin  (LIPITOR) 80 MG tablet, Take 1 tablet (80 mg total) by mouth daily., Disp: 30 tablet, Rfl: 11   cholecalciferol (VITAMIN D3) 25 MCG (1000 UNIT) tablet, Take 1,000 Units by mouth daily., Disp: , Rfl:    clobetasol  ointment (TEMOVATE ) 0.05 %, Apply 1 application topically  daily as needed (skin irritation). , Disp: , Rfl:    Multiple Vitamin (MULTIVITAMIN WITH MINERALS) TABS tablet, Take 1 tablet by mouth at bedtime., Disp: , Rfl:    omeprazole (PRILOSEC) 20 MG capsule, Take 20 mg by mouth every evening. , Disp: , Rfl:    sildenafil (VIAGRA) 100 MG tablet, Take 100 mg by mouth daily as needed., Disp: , Rfl:    triamcinolone cream (KENALOG) 0.1 %, Apply 1 application topically daily as needed (skin irritation.). , Disp: ,  Rfl:    vitamin C (ASCORBIC ACID) 250 MG tablet, Take 250 mg by mouth daily., Disp: , Rfl:   Vitals   Vitals:   08-26-24 0321 08/26/24 0330 2024-08-26 0400 08-26-2024 0800  BP:  126/60 123/75 120/70  Pulse:  84 82 87  Resp:  12 16 15   Temp:    98 F (36.7 C)  TempSrc:      SpO2:  97% 95% 97%  Weight: 82 kg     Height: 5' 7 (1.702 m)       Body mass index is 28.32 kg/m.   Physical Exam  General: Awake alert in no distress HEENT: Normocephalic atraumatic Lungs: Clear Cardiovascular: Regular rate rhythm at this time Neurological exam Awake alert oriented x 3 Mild word finding difficulty Mild dysarthria Cranial nerves II to XII-other than subtle right facial weakness, no deficits Motor examination with barely 1/5 to 2/5 right upper extremity strength all over the right upper extremity.  Barely antigravity for a second or 2 in the right lower extremity.  Left side full strength. Sensation intact light touch Coordination examination with no gross dysmetria on the left side.  Unable to perform on the right.  Labs/Imaging/Neurodiagnostic studies   CBC:  Recent Labs  Lab August 26, 2024 0321  WBC 7.8  NEUTROABS 5.9  HGB 13.7  HCT 39.3  MCV 99.2  PLT 176   Basic Metabolic Panel:  Lab Results  Component Value Date   NA 142 2024/08/26   K 4.4 08-26-24   CO2 25 08/26/24   GLUCOSE 122 (H) 08-26-24   BUN 16 Aug 26, 2024   CREATININE 1.53 (H) Aug 26, 2024   CALCIUM  9.4 08/26/2024   GFRNONAA 47 (L) 08/26/2024   GFRAA 59 (L)  05/10/2020   Lipid Panel:  Lab Results  Component Value Date   LDLCALC 110 (H) 04/02/2021   HgbA1c:  Lab Results  Component Value Date   HGBA1C 5.6 04/02/2021   Urine Drug Screen:     Component Value Date/Time   LABOPIA NONE DETECTED August 26, 2024 0651   COCAINSCRNUR NONE DETECTED 08/26/2024 0651   LABBENZ NONE DETECTED 26-Aug-2024 0651   AMPHETMU NONE DETECTED 08-26-24 0651   THCU NONE DETECTED 26-Aug-2024 0651   LABBARB NONE DETECTED Aug 26, 2024 0651    Alcohol Level     Component Value Date/Time   [facility] <15 08-26-24 0341   INR  Lab Results  Component Value Date   INR 1.3 (H) 08-26-2024   APTT  Lab Results  Component Value Date   APTT 34 08-26-24   CT Head without contrast(Personally reviewed): No acute findings  CT angio Head and Neck with contrast(Personally reviewed): No intracranial LVO or proximal stenosis.  Acute infarct in the left external capsule and body of the caudate visible as subtle low-density.  No evidence of hemorrhagic transformation.  Atherosclerotic calcification of carotid bifurcations and ICA bulbs but no stenosis.  Old infarction in the left frontal operculum.  MRI Brain(Personally reviewed): Acute lacunar infarct tracking from the posterior left corona radiata into the left lentiform.  No associated mass or hemorrhage.  Underlying chronic small and medium sized vessel ischemia including previous left MCA territory operculum infarct.  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  ASSESSMENT  FILIMON MIRANDA is a 73 y.o. male with hx of paroxysmal atrial fibrillation on Eliquis  compliant to medications, mitral valve prolapse status post mitral valve replacement in 2021, prior multiple left hemispheric strokes-involving the left MCA, left PCA and left ACA territories, with ongoing gait  imbalance and memory issues as well as mild expressive aphasia, presents for evaluation of sudden onset of right-sided weakness, which she  noted upon waking up around 230 or 3 AM today.  Last known well is 9 PM when he went to bed at his neurological baseline.  Examination reveals right hemiparesis, subtle right facial weakness, mild dysarthria and mild expressive aphasia which I imagine is baseline. MRI imaging reveals lacunar infarction in the left corona radiata/lentiform nuclei. He has been on Eliquis  and aspirin  for multiple years now and has had breakthrough strokes. The appearance of this stroke appears to be more of or is expected with small vessel disease but given his paroxysmal atrial fibrillation and cardiac risk factors, cardiology versus was also possible. He has been on Eliquis , direct factor Xa inhibitor, for a while-requiring consideration whether changing Eliquis  to something like Pradaxa, which is a direct thrombin inhibitor should be considered with the thought process that he has failed Eliquis .  Impression: Acute ischemic stroke-MRI appearance consistent with small vessel disease but has enough embolic risk factors so could be cardioembolic as well  RECOMMENDATIONS  Admit to hospitalist Frequent neurochecks Telemetry 2D echo A1c Lipid panel Therapy assessments N.p.o. until cleared by bedside swallow evaluation or formal swallow evaluation Okay to continue aspirin  for now Hold Eliquis  for now. There is not enough evidence that suggests that changing 1 DOAC to the other may be of much benefit but considering a different mechanism medication then Eliquis , such as Pradaxa should be considered.  I would reach out to cardiology to see if they have any strong input on this. I will follow-up the above tests with you Plan was relayed to Dr. Laurita ______________________________________________________________________    Signed, Eligio Lav, MD [facility]

## 2024-08-18 NOTE — ED Notes (Signed)
 Hold on swallow screen to keep NPO per MD Ward

## 2024-08-18 NOTE — ED Provider Notes (Signed)
 [facility] Provider Note    Event Date/Time   First MD Initiated Contact with Patient 08/18/24 [phone]     (approximate)   History   Extremity Weakness (RUE and RLE)   HPI  Terry [name] is a 73 y.o. male with history of A-fib on Eliquis , hypertension, hyperlipidemia, prior stroke with no residual deficits who presents to the emergency department with EMS for concerns for right-sided weakness.  Patient reports that at 2 PM yesterday he started feeling poorly.  He he is unsure if that is when his strokelike symptoms started but states that he did not feel right.  He has a hard time describing this further.  He states that at 9 PM he went to bed still not feeling well but when he woke up at 2:30 AM he had weakness in the right upper and lower extremity which caused him to fall.  He also reports that he has been having sore throat today and has noted some swelling to the anterior part of his neck.  He denies any fevers, cough or congestion.  He states he has been fully vaccinated.  No sick contacts, recent travel other than to a local beach.   History provided by patient, EMS.    Past Medical History:  Diagnosis Date   Diverticulosis    GERD (gastroesophageal reflux disease)    Heart murmur    Hypercholesteremia    Hypertension    Mitral valve prolapse    Stroke [facility])     Past Surgical History:  Procedure Laterality Date   COLONOSCOPY WITH PROPOFOL  N/A 03/06/2020   Procedure: COLONOSCOPY WITH PROPOFOL ;  Surgeon: Toledo, Ladell MARLA, MD;  Location: [facility] ENDOSCOPY;  Service: Gastroenterology;  Laterality: N/A;   MITRAL VALVE REPLACEMENT     RETINAL DETACHMENT SURGERY     RIGHT/LEFT HEART CATH AND CORONARY ANGIOGRAPHY Bilateral 02/18/2017   Procedure: Right/Left Heart Cath and Coronary Angiography;  Surgeon: Marsa Dooms, MD;  Location: [facility] INVASIVE CV LAB;  Service: Cardiovascular;  Laterality: Bilateral;   RIGHT/LEFT HEART CATH AND CORONARY ANGIOGRAPHY  N/A 02/22/2020   Procedure: RIGHT/LEFT HEART CATH AND CORONARY ANGIOGRAPHY;  Surgeon: Dooms Marsa, MD;  Location: [facility] INVASIVE CV LAB;  Service: Cardiovascular;  Laterality: N/A;   TEMPOROMANDIBULAR JOINT SURGERY      MEDICATIONS:  Prior to Admission medications   Medication Sig Start Date End Date Taking? Authorizing Provider  apixaban  (ELIQUIS ) 5 MG TABS tablet Take 1 tablet (5 mg total) by mouth 2 (two) times daily. 05/09/20   Maree Hue, MD  aspirin  EC 81 MG EC tablet Take 1 tablet (81 mg total) by mouth daily. 05/11/20   Maree Hue, MD  atorvastatin  (LIPITOR) 80 MG tablet Take 1 tablet (80 mg total) by mouth daily. 04/03/21 04/03/22  Rojelio Nest, DO  cholecalciferol (VITAMIN D3) 25 MCG (1000 UNIT) tablet Take 1,000 Units by mouth daily.    [provider]  clobetasol  ointment (TEMOVATE ) 0.05 % Apply 1 application topically daily as needed (skin irritation).  01/07/17   [provider]  Multiple Vitamin (MULTIVITAMIN WITH MINERALS) TABS tablet Take 1 tablet by mouth at bedtime.    [provider]  omeprazole (PRILOSEC) 20 MG capsule Take 20 mg by mouth every evening.  01/11/17   [provider]  triamcinolone cream (KENALOG) 0.1 % Apply 1 application topically daily as needed (skin irritation.).  01/11/17   [provider]  vitamin C (ASCORBIC ACID) 250 MG tablet Take 250 mg by mouth daily.  [provider]    Physical Exam   Triage Vital Signs: ED Triage Vitals  Encounter Vitals Group     BP 08/18/24 0318 139/82     Girls Systolic BP Percentile --      Girls Diastolic BP Percentile --      Boys Systolic BP Percentile --      Boys Diastolic BP Percentile --      Pulse Rate 08/18/24 0318 85     Resp 08/18/24 0318 18     Temp 08/18/24 0318 98.5 F (36.9 C)     Temp Source 08/18/24 0318 Oral     SpO2 08/18/24 0318 95 %     Weight 08/18/24 0321 180 lb 12.8 oz (82 kg)     Height 08/18/24 0321 5' 7 (1.702 m)     Head  Circumference --      Peak Flow --      Pain Score 08/18/24 0321 0     Pain Loc --      Pain Education --      Exclude from Growth Chart --     Most recent vital signs: Vitals:   08/18/24 0330 08/18/24 0400  BP: 126/60 123/75  Pulse: 84 82  Resp: 12 16  Temp:    SpO2: 97% 95%    CONSTITUTIONAL: Alert, responds appropriately to questions.  Elderly, laughing, joking HEAD: Normocephalic, atraumatic EYES: Conjunctivae clear, pupils appear equal, sclera nonicteric ENT: normal nose; moist mucous membranes, patient has erythematous vesicular appearing lesions to the soft palate, posterior oropharynx and tonsils with mild tonsillar hypertrophy.  No uvular deviation.  No trismus, stridor, drooling.  Mildly dysarthric.  No muffled voice.  NECK: Supple, normal ROM, no midline spinal tenderness or step-off or deformity.  He does have soft tissue swelling noted to the midline of his neck.  Hyoid bone is nontender.  No increased redness, warmth, bruising.  No expanding hematoma.  I do not appreciate obvious thyromegaly.  Trachea midline.  No cervical lymphadenopathy appreciated.  Area is not indurated or hard to palpation but is mildly tender.  Tongue sits flat in the bottom of the mouth.  No obvious dental abnormality, dental abscess. CARD: RRR; S1 and S2 appreciated RESP: Normal chest excursion without splinting or tachypnea; breath sounds clear and equal bilaterally; no wheezes, no rhonchi, no rales, no hypoxia or respiratory distress, speaking full sentences ABD/GI: Non-distended; soft, non-tender, no rebound, no guarding, no peritoneal signs BACK: The back appears normal EXT: Normal ROM in all joints; no deformity noted, no edema SKIN: Normal color for age and race; warm; no rash on exposed skin NEURO: Normal movement of the left upper and lower extremity, cranial nerves tact, right upper and lower extremity weakness, normal sensation diffusely, mild dysarthria, no neglect, no visual field  deficits, NIH stroke scale 3 PSYCH: The patient's mood and manner are appropriate.   ED Results / Procedures / Treatments   LABS: (all labs ordered are listed, but only abnormal results are displayed) Labs Reviewed  URINALYSIS, ROUTINE W REFLEX MICROSCOPIC - Abnormal; Notable for the following components:      Result Value   Color, Urine YELLOW (*)    APPearance CLEAR (*)    All other components within normal limits  CBC - Abnormal; Notable for the following components:   RBC 3.96 (*)    MCH 34.6 (*)    All other components within normal limits  COMPREHENSIVE METABOLIC PANEL WITH GFR - Abnormal; Notable for the following components:  Glucose, Bld 122 (*)    Creatinine, Ser 1.53 (*)    GFR, Estimated 47 (*)    All other components within normal limits  PROTIME-INR - Abnormal; Notable for the following components:   Prothrombin Time 16.4 (*)    INR 1.3 (*)    All other components within normal limits  GROUP A STREP BY PCR  RESP PANEL BY RT-PCR (RSV, FLU A&B, COVID)  RVPGX2  HSV CULTURE AND TYPING  HSV CULTURE AND TYPING  ETHANOL  APTT  DIFFERENTIAL  MAGNESIUM  TSH  T4, FREE  MONONUCLEOSIS SCREEN  URINE DRUG SCREEN, QUALITATIVE ([facility] ONLY)  RPR  HIV ANTIBODY (ROUTINE TESTING W REFLEX)     EKG:  EKG Interpretation Date/Time:  Friday August 18 2024 06:49:31 EDT Ventricular Rate:  83 PR Interval:  169 QRS Duration:  109 QT Interval:  379 QTC Calculation: 446 R Axis:   4  Text Interpretation: Sinus rhythm Multiform ventricular premature complexes Left atrial enlargement Abnormal R-wave progression, early transition Left ventricular hypertrophy Nonspecific T abnormalities, inferior leads Confirmed by UNCONFIRMED, DOCTOR ([id number]), editor Alex Slough [phone]) on 08/18/2024 7:37:36 AM         RADIOLOGY: My personal review and interpretation of imaging: MRI brain shows acute stroke.  I have personally reviewed all radiology reports.   MR BRAIN WO CONTRAST Result  Date: 08/18/2024 CLINICAL DATA:  73 year old male status post fall. Neurologic deficit. EXAM: MRI HEAD WITHOUT CONTRAST TECHNIQUE: Multiplanar, multiecho pulse sequences of the brain and surrounding structures were obtained without intravenous contrast. COMPARISON:  Head CT 0343 hours today.  Brain MRI 04/01/2021. FINDINGS: Brain: Patchy chronic left MCA middle division cortical encephalomalacia, primarily affecting the operculum, stable since 2022. Chronic lacunar infarcts in the bilateral caudate nuclei are stable since 2022. Evolution of left thalamic lacunar infarct since the previous MRI. Multiple small bilateral cerebellar infarcts are new or increased since 2022. small chronic right inferior parietal lobe cortical encephalomalacia is stable. Patchy acute lacunar type infarct tracking from the posterior left corona radiata into the left lentiform (series 5 images 31-27. mild T2 and FLAIR hyperintensity. No hemorrhage or mass effect. No other diffusion restriction. No midline shift, mass effect, evidence of mass lesion, ventriculomegaly, extra-axial collection or acute intracranial hemorrhage. Cervicomedullary junction and pituitary are within normal limits. Vascular: Major intracranial vascular flow voids are stable since 2022. Skull and upper cervical spine: Chronic lacunar infarcts in the bilateral caudate nuclei are stable since 2022. Evolution of left thalamic lacunar infarct since the previous MRI. Small bilateral cerebellar infarcts are new or increased since 2022. Patchy acute lacunar type infarct tracking from the posterior left corona radiata into the left lentiform (series 5 images 31-27. Sinuses/Orbits: Negative. Visualized bone marrow signal is within normal limits. Other: Stable, negative. IMPRESSION: 1. Acute lacunar infarct tracking from the posterior left corona radiata into the left lentiform. No associated hemorrhage or mass effect. 2. Underlying chronic small and medium-sized vessel  ischemia, including a previous left MCA territory operculum infarct. 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 Electronically Signed   By: VEAR Hurst M.D.   On: 08/18/2024 06:51   CT HEAD WO CONTRAST ( ) Result Date: 08/18/2024 CLINICAL DATA:  Neuro deficit. Fell from standing. Right-side feels funny. Weakness. EXAM: CT HEAD WITHOUT CONTRAST TECHNIQUE: Contiguous axial images were obtained from the base of the skull through the vertex without intravenous contrast. RADIATION DOSE REDUCTION: This exam was performed according to the departmental dose-optimization program which includes automated exposure control, adjustment  of the mA and/or kV according to patient size and/or use of iterative reconstruction technique. COMPARISON:  CT 04/01/2021 FINDINGS: Brain: No intracranial hemorrhage, mass effect, or evidence of acute infarct. No hydrocephalus. No extra-axial fluid collection. Chronic left frontal infarct. Vascular: No hyperdense vessel or unexpected calcification. Skull: No fracture or focal lesion. Sinuses/Orbits: No acute finding. Other: None. IMPRESSION: No acute intracranial abnormality. Electronically Signed   By: Norman Gatlin M.D.   On: 08/18/2024 03:56     PROCEDURES:  Critical Care performed: Yes, see critical care procedure note(s)   CRITICAL CARE Performed by: Josette Birgitta Uhlir   Total critical care time: 30 minutes  Critical care time was exclusive of separately billable procedures and treating other patients.  Critical care was necessary to treat or prevent imminent or life-threatening deterioration.  Critical care was time spent personally by me on the following activities: development of treatment plan with patient and/or surrogate as well as nursing, discussions with consultants, evaluation of patient's response to treatment, examination of patient, obtaining history from patient or surrogate, ordering and performing treatments and interventions,  ordering and review of laboratory studies, ordering and review of radiographic studies, pulse oximetry and re-evaluation of patient's condition.   SABRA1-3 Lead EKG Interpretation  Performed by: Shelonda Saxe, Josette SAILOR, DO Authorized by: Xariah Silvernail, Josette SAILOR, DO     Interpretation: normal     ECG rate:  82   ECG rate assessment: normal     Rhythm: sinus rhythm     Ectopy: none     Conduction: normal       IMPRESSION / MDM / ASSESSMENT AND PLAN / ED COURSE  I reviewed the triage vital signs and the nursing notes.    Patient here with complaints of right-sided weakness.  Very difficult to obtain from him when his last known well was.  May have been 2 PM yesterday or 9 PM yesterday.  Outside of tPA window and patient is on Eliquis .  Also complaining of sore throat and neck swelling.  Neck obviously swollen on exam and he has lesions within the posterior oropharynx, palate and on his tonsils.  The patient is on the cardiac monitor to evaluate for evidence of arrhythmia and/or significant heart rate changes.   DIFFERENTIAL DIAGNOSIS (includes but not limited to):   Stroke, intracranial hemorrhage, UTI, metabolic derangement, recrudescence of prior stroke symptoms due to infection such as pharyngitis, viral URI, HSV, tonsillitis, deep space neck infection, sialoadenitis   Patient's presentation is most consistent with acute presentation with potential threat to life or bodily function.   PLAN: Will obtain labs, urine, noncontrast CT of the head.  I also feel he will need a CTA of his head and neck but has a contrast allergy and will therefore need premedication.  He is not LVO positive here other than mild weakness which seems to be improving.  Will also obtain MRI of the brain if head CT is unremarkable to evaluate for new stroke.  Patient also has lesions within the oropharynx, palate and tonsils concerning for infection with the swelling noted to the anterior neck.  I worry about developing deep  space neck infection, sialoadenitis, malignancy, Ludwig's angina.  I feel he will need a CT of the soft tissues of his neck.  Will also test for strep, COVID, flu, RPR, HIV, mono.  Will check thyroid function test.   MEDICATIONS GIVEN IN ED: Medications  methylPREDNISolone  sodium succinate  (SOLU-MEDROL ) 40 mg/mL injection 40 mg (40 mg Intravenous Given 08/18/24 0334)  diphenhydrAMINE  (  BENADRYL ) capsule 50 mg (50 mg Oral Given 08/18/24 0630)    Or  diphenhydrAMINE  (BENADRYL ) injection 50 mg ( Intravenous See Alternative 08/18/24 0630)  clindamycin  (CLEOCIN ) IVPB 600 mg (0 mg Intravenous Stopped 08/18/24 0527)  sodium chloride  0.9 % bolus 500 mL (0 mLs Intravenous Stopped 08/18/24 0527)     ED COURSE: Patient's labs show no leukocytosis.  Mildly elevated creatinine.  Will give IV fluids.  Thyroid function studies normal.  Negative strep, COVID, flu and RSV.  Mono negative.  Head CT reviewed and interpreted by myself and the radiologist and shows no acute abnormality.  Will obtain MRI brain.  Patient outside tPA window.  Also not a tPA candidate given he is on Eliquis .  He will need medical admission.   7:30 AM  Pt having intermittent episodes where he will have brief runs of atrial fibrillation with RVR and then go back into a sinus rhythm.  Also having occasional ectopy.  Electrolytes within normal limits.  He denies having any symptoms with these episodes.  Will continue to monitor.  MRI of the brain reviewed and interpreted by myself and the radiologist and shows that patient has had an acute stroke.  Will need medical admission for this.  Patient has just received his Benadryl  an hour ago and should be ready for CT scan now.  Will wait for CT results of the soft tissues of his neck prior to admission.  Signed out to Dr. Viviann at 7:45 AM.  CONSULTS: Patient will need medical admission.   OUTSIDE RECORDS REVIEWED: Reviewed last neurology note on 06/14/2024.       FINAL CLINICAL  IMPRESSION(S) / ED DIAGNOSES   Final diagnoses:  Acute right-sided weakness  Neck swelling  Acute CVA (cerebrovascular accident) (HCC)     Rx / DC Orders   ED Discharge Orders     None        Note:  This document was prepared using Dragon voice recognition software and may include unintentional dictation errors.   Bee Marchiano, Josette SAILOR, DO 08/18/24 [phone]

## 2024-08-19 DIAGNOSIS — R29708 NIHSS score 8: Secondary | ICD-10-CM | POA: Diagnosis not present

## 2024-08-19 DIAGNOSIS — N1330 Unspecified hydronephrosis: Secondary | ICD-10-CM | POA: Diagnosis not present

## 2024-08-19 DIAGNOSIS — Z1152 Encounter for screening for COVID-19: Secondary | ICD-10-CM | POA: Diagnosis not present

## 2024-08-19 DIAGNOSIS — Z751 Person awaiting admission to adequate facility elsewhere: Secondary | ICD-10-CM | POA: Diagnosis not present

## 2024-08-19 DIAGNOSIS — I6381 Other cerebral infarction due to occlusion or stenosis of small artery: Secondary | ICD-10-CM | POA: Diagnosis not present

## 2024-08-19 DIAGNOSIS — I6300 Cerebral infarction due to thrombosis of unspecified precerebral artery: Secondary | ICD-10-CM | POA: Diagnosis not present

## 2024-08-19 DIAGNOSIS — Z8673 Personal history of transient ischemic attack (TIA), and cerebral infarction without residual deficits: Secondary | ICD-10-CM | POA: Diagnosis not present

## 2024-08-19 DIAGNOSIS — E7800 Pure hypercholesterolemia, unspecified: Secondary | ICD-10-CM | POA: Diagnosis not present

## 2024-08-19 DIAGNOSIS — Z741 Need for assistance with personal care: Secondary | ICD-10-CM | POA: Diagnosis not present

## 2024-08-19 DIAGNOSIS — R471 Dysarthria and anarthria: Secondary | ICD-10-CM | POA: Diagnosis not present

## 2024-08-19 DIAGNOSIS — Z87891 Personal history of nicotine dependence: Secondary | ICD-10-CM | POA: Diagnosis not present

## 2024-08-19 DIAGNOSIS — Z880 Allergy status to penicillin: Secondary | ICD-10-CM | POA: Diagnosis not present

## 2024-08-19 DIAGNOSIS — N179 Acute kidney failure, unspecified: Secondary | ICD-10-CM | POA: Diagnosis not present

## 2024-08-19 DIAGNOSIS — Z91041 Radiographic dye allergy status: Secondary | ICD-10-CM | POA: Diagnosis not present

## 2024-08-19 DIAGNOSIS — Z953 Presence of xenogenic heart valve: Secondary | ICD-10-CM | POA: Diagnosis not present

## 2024-08-19 DIAGNOSIS — Z881 Allergy status to other antibiotic agents status: Secondary | ICD-10-CM | POA: Diagnosis not present

## 2024-08-19 DIAGNOSIS — R29810 Facial weakness: Secondary | ICD-10-CM | POA: Diagnosis not present

## 2024-08-19 DIAGNOSIS — K219 Gastro-esophageal reflux disease without esophagitis: Secondary | ICD-10-CM | POA: Diagnosis not present

## 2024-08-19 DIAGNOSIS — G8191 Hemiplegia, unspecified affecting right dominant side: Secondary | ICD-10-CM | POA: Diagnosis not present

## 2024-08-19 DIAGNOSIS — M6281 Muscle weakness (generalized): Secondary | ICD-10-CM | POA: Diagnosis not present

## 2024-08-19 DIAGNOSIS — Z7982 Long term (current) use of aspirin: Secondary | ICD-10-CM | POA: Diagnosis not present

## 2024-08-19 DIAGNOSIS — I6340 Cerebral infarction due to embolism of unspecified cerebral artery: Secondary | ICD-10-CM | POA: Diagnosis not present

## 2024-08-19 DIAGNOSIS — W19XXXA Unspecified fall, initial encounter: Secondary | ICD-10-CM | POA: Diagnosis present

## 2024-08-19 DIAGNOSIS — I639 Cerebral infarction, unspecified: Secondary | ICD-10-CM | POA: Diagnosis present

## 2024-08-19 DIAGNOSIS — G459 Transient cerebral ischemic attack, unspecified: Secondary | ICD-10-CM | POA: Diagnosis not present

## 2024-08-19 DIAGNOSIS — I10 Essential (primary) hypertension: Secondary | ICD-10-CM | POA: Diagnosis not present

## 2024-08-19 DIAGNOSIS — I341 Nonrheumatic mitral (valve) prolapse: Secondary | ICD-10-CM | POA: Diagnosis not present

## 2024-08-19 DIAGNOSIS — Z743 Need for continuous supervision: Secondary | ICD-10-CM | POA: Diagnosis not present

## 2024-08-19 DIAGNOSIS — I480 Paroxysmal atrial fibrillation: Secondary | ICD-10-CM | POA: Diagnosis not present

## 2024-08-19 DIAGNOSIS — Z7901 Long term (current) use of anticoagulants: Secondary | ICD-10-CM | POA: Diagnosis not present

## 2024-08-19 DIAGNOSIS — I2510 Atherosclerotic heart disease of native coronary artery without angina pectoris: Secondary | ICD-10-CM | POA: Diagnosis not present

## 2024-08-19 LAB — LIPID PANEL
Cholesterol: 120 mg/dL (ref 0–200)
HDL: 30 mg/dL — ABNORMAL LOW (ref >40)
LDL Cholesterol: 71 mg/dL (ref 0–99)
Total CHOL/HDL Ratio: 4 ratio
Triglycerides: 96 mg/dL (ref <150)
VLDL: 19 mg/dL (ref 0–40)

## 2024-08-19 LAB — BASIC METABOLIC PANEL WITH GFR
Anion gap: 10 (ref 5–15)
BUN: 23 mg/dL (ref 8–23)
CO2: 21 mmol/L — ABNORMAL LOW (ref 22–32)
Calcium: 9.4 mg/dL (ref 8.9–10.3)
Chloride: 108 mmol/L (ref 98–111)
Creatinine, Ser: 1.23 mg/dL (ref 0.61–1.24)
GFR, Estimated: >60 mL/min (ref >60)
Glucose, Bld: 100 mg/dL — ABNORMAL HIGH (ref 70–99)
Potassium: 4.5 mmol/L (ref 3.5–5.1)
Sodium: 139 mmol/L (ref 135–145)

## 2024-08-19 LAB — CREATININE, URINE, RANDOM: Creatinine, Urine: 59 mg/dL

## 2024-08-19 LAB — SODIUM, URINE, RANDOM: Sodium, Ur: 92 mmol/L

## 2024-08-19 NOTE — Progress Notes (Signed)
 NEUROLOGY CONSULT FOLLOW UP NOTE   Date of service: August 19, 2024 Patient Name: Terry [name] MRN:  [id number] DOB:  1951-02-24  Interval Hx/subjective  Seen and examined.  Right side much weaker than what it was on presentation but has stabilized.  Vitals   Vitals:   08/18/24 1937 08/19/24 0010 08/19/24 0356 08/19/24 0756  BP: (!) 127/94 109/66 123/66 130/80  Pulse: 88 85 84 81  Resp: 19 18 18 18   Temp: 97.9 F (36.6 C) 98 F (36.7 C) 97.8 F (36.6 C) 98 F (36.7 C)  TempSrc:      SpO2: 99% 96% 98% 100%  Weight:      Height:         Body mass index is 28.32 kg/m.  Physical Exam   General: Awake alert in no distress Neurological exam Awake alert oriented x 3 Speech is mildly dysarthric No evidence of aphasia Cranial nerves: Pupils are equal round react light, extract movements are normal, visual fields are full, face is grossly symmetric at rest and on smiling with near complete lower facial weakness on the right including weakness of the right eyelid.  Forehead wrinkling is preserved on both sides.  Tongue and palate midline. Motor examination with 0/5 right upper extremity with may be 1/5 on a lot of effort but that is only approximately moving the shoulder.  Lower extremity on the right is also 0/5.  Left side is full strength Sensation is intact without extinction Coordination on the left is intact.  Unable to test on the left.  Medications  Current Facility-Administered Medications:    acetaminophen  (TYLENOL ) tablet 650 mg, 650 mg, Oral, Q4H PRN **OR** acetaminophen  (TYLENOL ) 160 MG/5ML solution 650 mg, 650 mg, Per Tube, Q4H PRN **OR** acetaminophen  (TYLENOL ) suppository 650 mg, 650 mg, Rectal, Q4H PRN, Laurita Manor T, MD   aspirin  EC tablet 81 mg, 81 mg, Oral, Daily, Laurita Manor T, MD, 81 mg at 08/19/24 9070   atorvastatin  (LIPITOR) tablet 80 mg, 80 mg, Oral, QHS, Laurita Manor T, MD, 80 mg at 08/18/24 2203   enoxaparin  (LOVENOX ) injection 40 mg, 40 mg,  Subcutaneous, Q24H, Laurita Manor T, MD, 40 mg at 08/18/24 2203   melatonin tablet 5 mg, 5 mg, Oral, QHS, Zhang, Ping T, MD, 5 mg at 08/18/24 2203   pantoprazole  (PROTONIX ) EC tablet 40 mg, 40 mg, Oral, Daily, Laurita Manor T, MD, 40 mg at 08/19/24 9070   senna-docusate (Senokot-S) tablet 1 tablet, 1 tablet, Oral, QHS PRN, Laurita Manor DASEN, MD  Labs and Diagnostic Imaging   CBC:  Recent Labs  Lab 08/18/24 0321  WBC 7.8  NEUTROABS 5.9  HGB 13.7  HCT 39.3  MCV 99.2  PLT 176    Basic Metabolic Panel:  Lab Results  Component Value Date   NA 139 08/19/2024   K 4.5 08/19/2024   CO2 21 (L) 08/19/2024   GLUCOSE 100 (H) 08/19/2024   BUN 23 08/19/2024   CREATININE 1.23 08/19/2024   CALCIUM  9.4 08/19/2024   GFRNONAA >60 08/19/2024   GFRAA 59 (L) 05/10/2020   Lipid Panel:  Lab Results  Component Value Date   LDLCALC 71 08/19/2024   HgbA1c:  Lab Results  Component Value Date   HGBA1C 5.3 08/18/2024   Urine Drug Screen:     Component Value Date/Time   LABOPIA NONE DETECTED 08/18/2024 0651   COCAINSCRNUR NONE DETECTED 08/18/2024 0651   LABBENZ NONE DETECTED 08/18/2024 0651   AMPHETMU NONE DETECTED 08/18/2024 0651   THCU  NONE DETECTED 08/18/2024 0651   LABBARB NONE DETECTED 08/18/2024 0651    Alcohol Level     Component Value Date/Time   [facility] <15 08/18/2024 0341   INR  Lab Results  Component Value Date   INR 1.3 (H) 08/18/2024   APTT  Lab Results  Component Value Date   APTT 34 08/18/2024   CT Head without contrast(Personally reviewed): No acute findings   CT angio Head and Neck with contrast(Personally reviewed): No intracranial LVO or proximal stenosis.  Acute infarct in the left external capsule and body of the caudate visible as subtle low-density.  No evidence of hemorrhagic transformation.  Atherosclerotic calcification of carotid bifurcations and ICA bulbs but no stenosis.  Old infarction in the left frontal operculum.   MRI Brain(Personally reviewed): Acute  lacunar infarct tracking from the posterior left corona radiata into the left lentiform.  No associated mass or hemorrhage.  Underlying chronic small and medium sized vessel ischemia including previous left MCA territory operculum infarct.  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  Repeat head CT done yesterday for concern for neuro worsening-reviewed yesterday-possible mild extension of the left deep white and deep gray matter lacunar infarct since the DWI earlier in the morning.  No hemorrhagic transformation.  Assessment   Terry [name] is a 73 y.o. 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 MRI brain showed lacunar infarction in the left frontal radiata/lentiform nuclei with mild expansion on follow-up CT. The look of the stroke on MRI is more suggestive of small vessel etiology but given his risk factors, embolic source is always in the differentials Since he has had multiple strokes in Eliquis , discussed with the cardiologist to switch to Pradaxa -cardiology okay with consideration of switching to Pradaxa . Will run by pharmacy to make sure that the patient is able to have that medication affordable and accessible.  Impression: Acute ischemic stroke-likely embolic although small vessel etiology is also possible  Recommendations  Frequent neurochecks Telemetry A1c at goal LDL goal is 70.  Patient's LDL is 71.  He is on Lipitor 80 at home.  Continue  Lipitor. He is on aspirin  and Eliquis .  Discussed with the on-call cardiologist if switching to Pradaxa  would be appropriate or if they have any concerns.  No concerns from cardiology for switching. Would like to transition his anticoagulation from Eliquis  to Pradaxa  and start first dose tomorrow morning.  Will run with pharmacy to make sure that the medication is accessible and affordable. If there are any issues with accessibility or affordability, can continue Eliquis  back. Therapy assessments.  Family and patient motivated for good recovery and looking for CIR.  Processes in place. Medical management per primary team  Plan discussed with the patient and family at bedside and with Dr. Trudy  ______________________________________________________________________   Signed, Eligio Lav, MD [facility]

## 2024-08-19 NOTE — Evaluation (Addendum)
 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   Venous insufficiency 09/27/2017   GERD (gastroesophageal reflux disease) 09/27/2017   Past Medical History:  Past Medical History:  Diagnosis Date   Diverticulosis    GERD (gastroesophageal reflux disease)    Heart murmur    Hypercholesteremia    Hypertension    Mitral valve prolapse    Stroke [facility])    Past Surgical History:  Past Surgical History:  Procedure Laterality Date   COLONOSCOPY WITH PROPOFOL  N/A 03/06/2020   Procedure: COLONOSCOPY WITH PROPOFOL ;  Surgeon: Toledo, Ladell MARLA, MD;  Location: [facility] ENDOSCOPY;  Service: Gastroenterology;  Laterality: N/A;   MITRAL VALVE REPLACEMENT     RETINAL DETACHMENT SURGERY     RIGHT/LEFT HEART CATH AND CORONARY ANGIOGRAPHY Bilateral 02/18/2017   Procedure: Right/Left Heart Cath and Coronary Angiography;  Surgeon: Marsa Dooms, MD;  Location: [facility] INVASIVE CV LAB;  Service: Cardiovascular;  Laterality: Bilateral;   RIGHT/LEFT HEART CATH AND CORONARY ANGIOGRAPHY N/A 02/22/2020   Procedure: RIGHT/LEFT HEART CATH AND CORONARY ANGIOGRAPHY;  Surgeon: Dooms Marsa, MD;  Location: [facility] INVASIVE CV LAB;  Service: Cardiovascular;  Laterality: N/A;   TEMPOROMANDIBULAR JOINT SURGERY     HPI:  Pt is a 73 y.o. 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  MRI brain showed lacunar infarction in the left frontal radiata/lentiform nuclei with mild expansion on follow-up CT.   Assessment / Plan / Recommendation Clinical Impression  Patient seen today for speech-language evaluation; Dysarthria assessment. Pt awake/alert; verbal. Family(Son) present briefly. Pt was A/Ox4. He was verbal and engaged appropriately in conversation w/ Staff and Family. He followed instructions appropriately; noted Dysarthria and R oral motor weakness.  On RA, afebrile. WBC wnl.  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 Per his last tx note in 2022:  Pt reports that he is making progress with his frustration level as well as increased use of word finding strategies during social situations, including stressful situation.  He also reports that he is focused on using a slower rate to increase his speech intelligibility. He also reports that he has been recording himself talking and playing it back to keep his speech calibrated. At this time, pt requests discharge from ST as he has met all STG and LTGs.  Patient completed speech/fluency and informal cognitive-linguistic evaluation today w/ parts of the Bedside Western Aphasia Battery (B-WAB) and dynamic assessment.  During the assessment, pt was verbal and engaged easily; no pragmatic deficits. Speech intelligibility was 75-100% during conversation when pt sat Upright for best breath support  and slowed his rate of speech- pt stated I talk fast. No overt receptive nor expressive language deficits noted; no overt aphasia. Tasks including picture description, following commands, more complex Y/N questions, calculations, naming, and completing similarity/differences tasks were all [facility]. No overt Cognitive deficits noted as well; A/O x4, calculation, fluency, and functional memory questions re: his day today were all WFL. Pt exhibited motor speech deficits c/b Dysarthria; decreased articulation of speech sounds. Deficits c/b imprecise consonants(esp, /f/, /th/, /s/) and mild+ increased rate of speech. Vocal quality and resonance was [facility].  OM exam was revealed Right lingual/labial weakness and decreased ROM/Symmetry; CNs 7 and 12. Min lingual deviation to the R noted(unsure if new?); lingual strength/ROM appeared WFL in isolated movements, but suspect decreased in connected movements such as speech. Decreased R labial-facial tone/strength noted. Pt endorsed min R labial wetness/drooling at lips though no overt drooling noted during session.    SLP provided education re: importance of skilled ST services to address/re-educate on speech-related strategies to combat the Dysarthria and improve speech intelligibility. Pt and Son educated on speech/articulatory strategies to include: increasing loudness(w/ appropriate breath support), slowing rate of speech, and over-articulation. These were practiced w/ Menu reading, word list, and automatic speech tasks. OF NOTE: pt exhibited improved speech intelligibility and precision of speech sounds when utilizing the strategies; Son and pt noted the difference as well.  Education and handouts given on the strategies; practiced w/ pt and Son. Questions answered.     Recommend f/u skilled ST services post D/C to address patient's Dysarthria and speech-related goals using articulatory strategies to increase overall intelligibility, communication effectiveness in ADLs,  and quality of life. MD and Team updated.    SLP Assessment  SLP Recommendation/Assessment: Patient needs continued Speech Language Pathology Services (and at next venue of care) SLP Visit Diagnosis: Dysarthria and anarthria (R47.1) (further assessment in structured setting if indicated)     Assistance Recommended at Discharge  Intermittent Supervision/Assistance (w/ speech exs and follow through)  Functional Status Assessment Patient has had a recent decline in their functional status and demonstrates the ability to make significant improvements in function in a reasonable and predictable amount of time.  Frequency and Duration min 1 x/week  1 week      SLP Evaluation Cognition  Overall Cognitive Status: Within Functional Limits for tasks assessed Arousal/Alertness: Awake/alert Orientation Level: Oriented X4 Year: 2025 Month: August Attention: Focused;Sustained Focused Attention: Appears intact Sustained Attention: Appears intact Memory: Appears intact Awareness: Appears intact Problem Solving: Appears intact Executive Function: Reasoning;Sequencing Reasoning: Appears intact Sequencing: Appears intact Behaviors:  (n/a) Safety/Judgment: Appears intact Comments: general problem situations in the home       Comprehension  Auditory Comprehension Overall Auditory Comprehension: Appears within functional limits for tasks assessed Yes/No Questions: Within Functional Limits Commands: Within Functional Limits (1-2 step) Conversation: Complex Other Conversation Comments: good details in conversation; conversation is impacted by Dysarthria Interfering Components:  (Dysarthria) Visual Recognition/Discrimination Discrimination: Not tested Reading Comprehension Reading Status: Within funtional limits (at sentence level)    Expression Expression Primary Mode of Expression: Verbal Verbal Expression Overall Verbal Expression: Appears within functional limits for tasks assessed (at  bedside assessment- did not strictly test higher executive functioning expressive language) Initiation: No impairment Automatic Speech:  (WFL) Level of Generative/Spontaneous Verbalization: Conversation Repetition: No impairment Naming: No impairment Pragmatics: No impairment Interfering  Components:  [facility]) Effective Techniques:  (n/a) Non-Verbal Means of Communication: Not applicable Written Expression Dominant Hand: Right (flaccid; uses his LUE) Written Expression: Not tested   Oral / Motor  Oral Motor/Sensory Function Overall Oral Motor/Sensory Function: Moderate impairment Facial ROM: Reduced right;Suspected CN VII (facial) dysfunction Facial Symmetry: Abnormal symmetry right;Suspected CN VII (facial) dysfunction Facial Strength: Reduced right;Suspected CN VII (facial) dysfunction Facial Sensation: Within Functional Limits Lingual ROM: Within Functional Limits Lingual Symmetry: Abnormal symmetry right;Suspected CN XII (hypoglossal) dysfunction (min) Lingual Strength: Within Functional Limits Lingual Sensation: Within Functional Limits Velum: Within Functional Limits Mandible: Within Functional Limits Motor Speech Overall Motor Speech: Impaired Respiration: Within functional limits Phonation: Normal Resonance: Within functional limits Articulation: Impaired Level of Impairment: Word Intelligibility: Intelligibility reduced (especially when pt spoke quickly/fast -- maybe min more impact?) Word: 75-100% accurate Phrase: 75-100% accurate Sentence: 75-100% accurate Conversation: 75-100% accurate Motor Planning: Within functional limits Motor Speech Errors: Not applicable Interfering Components:  ([employer]) Effective Techniques: Slow rate;Over-articulate;Increased vocal intensity              [name], MS, CCC-SLP Speech Language Pathologist Rehab Services; [facility] [phone] (ascom) Jung Yurchak 08/19/2024, 2:10 PM

## 2024-08-19 NOTE — Progress Notes (Signed)
 PROGRESS NOTE    Terry [name]  FMW:[id number] DOB: 1951/12/13 DOA: 08/18/2024 PCP: [name], MD  Assessment & Plan:   Active Problems:   Acute CVA (cerebrovascular accident) [facility])   CVA (cerebral vascular accident) (HCC)  Assessment and Plan: CVA: w/ right sided paresis. Continue on aspirin  and holding eliquis  as per neuro. Likely change eliquis  to pradaxa  tomorrow as per neuro. Hx of previous CVA w/ residual aphasia. PT/OT consulted   AKI: w/ mild left hydronephrosis as per renal US .     PAF: holding eliquis  as per neuro. Continue on tele    Hx of mitral prolapse: s/p bovine mitral valve replacement. Echo shows EF 40-45%, LV global hypokinesis, grade I diastolic dysfunction, mild MR, mod-severe MS, mod AS. Will need to f/u outpatient w/ cardio      DVT prophylaxis: lovenox   Code Status: full  Family Communication: Disposition Plan: possibly d/c to inpatient rehab  Level of care: Telemetry Medical  Status is: Observation The patient remains OBS appropriate and will d/c before 2 midnights.    Consultants:  Neuro   Procedures:   Antimicrobials:    Subjective: Pt c/o difficulty speaking and not being able to move right side of his body.   Objective: Vitals:   08/19/24 0010 08/19/24 0356 08/19/24 0756 08/19/24 1156  BP: 109/66 123/66 130/80 117/70  Pulse: 85 84 81 90  Resp: 18 18 18 16   Temp: 98 F (36.7 C) 97.8 F (36.6 C) 98 F (36.7 C) 98.4 F (36.9 C)  TempSrc:      SpO2: 96% 98% 100% 97%  Weight:      Height:        Intake/Output Summary (Last 24 hours) at 08/19/2024 1357 Last data filed at 08/18/2024 1856 Gross per 24 hour  Intake 1016.67 ml  Output 250 ml  Net 766.67 ml   Filed Weights   08/18/24 0321  Weight: 82 kg    Examination:  General exam: Appears calm but uncomfortable  Respiratory system: Clear to auscultation. Respiratory effort normal. Cardiovascular system: S1 & S2+. No rubs, gallops or clicks. . Gastrointestinal  system: Abdomen is nondistended, soft and nontender. Normal bowel sounds heard. Central nervous system: Alert and oriented. Unable to move right arm or right leg Psychiatry: Judgement and insight appear normal. Flat mood and affect.     Data Reviewed: I have personally reviewed following labs and imaging studies  CBC: Recent Labs  Lab 08/18/24 0321  WBC 7.8  NEUTROABS 5.9  HGB 13.7  HCT 39.3  MCV 99.2  PLT 176   Basic Metabolic Panel: Recent Labs  Lab 08/18/24 0321 08/18/24 0341 08/19/24 0614  NA 142  --  139  K 4.4  --  4.5  CL 107  --  108  CO2 25  --  21*  GLUCOSE 122*  --  100*  BUN 16  --  23  CREATININE 1.53*  --  1.23  CALCIUM  9.4  --  9.4  MG  --  1.9  --    GFR: Estimated Creatinine Clearance: 54 mL/min (by C-G formula based on SCr of 1.23 mg/dL). Liver Function Tests: Recent Labs  Lab 08/18/24 0321  AST 24  ALT 20  ALKPHOS 80  BILITOT 0.8  PROT 6.8  ALBUMIN 3.9   No results for input(s): LIPASE, AMYLASE in the last 168 hours. No results for input(s): AMMONIA in the last 168 hours. Coagulation Profile: Recent Labs  Lab 08/18/24 0321  INR 1.3*   Cardiac  Enzymes: No results for input(s): CKTOTAL, CKMB, CKMBINDEX, TROPONINI in the last 168 hours. BNP (last 3 results) No results for input(s): PROBNP in the last 8760 hours. HbA1C: Recent Labs    08/18/24 0321  HGBA1C 5.3   CBG: No results for input(s): GLUCAP in the last 168 hours. Lipid Profile: Recent Labs    08/19/24 0614  CHOL 120  HDL 30*  LDLCALC 71  TRIG 96  CHOLHDL 4.0   Thyroid Function Tests: Recent Labs    08/18/24 0341  TSH 3.956  FREET4 0.91   Anemia Panel: No results for input(s): VITAMINB12, FOLATE, FERRITIN, TIBC, IRON, RETICCTPCT in the last 72 hours. Sepsis Labs: No results for input(s): PROCALCITON, LATICACIDVEN in the last 168 hours.  Recent Results (from the past 240 hours)  Group A Strep by PCR ([facility] Only)     Status:  None   Collection Time: 08/18/24  3:41 AM   Specimen: Throat; Sterile Swab  Result Value Ref Range Status   Group A Strep by PCR NOT DETECTED NOT DETECTED Final    Comment: Performed at [facility], [address] [place], [place] [address]  Resp panel by RT-PCR (RSV, Flu A&B, Covid) Throat     Status: None   Collection Time: 08/18/24  3:41 AM   Specimen: Throat; Nasal Swab  Result Value Ref Range Status   SARS Coronavirus 2 by RT PCR NEGATIVE NEGATIVE Final    Comment: (NOTE) SARS-CoV-2 target nucleic acids are NOT DETECTED.  The SARS-CoV-2 RNA is generally detectable in upper respiratory specimens during the acute phase of infection. The lowest concentration of SARS-CoV-2 viral copies this assay can detect is 138 copies/mL. A negative result does not preclude SARS-Cov-2 infection and should not be used as the sole basis for treatment or other patient management decisions. A negative result may occur with  improper specimen collection/handling, submission of specimen other than nasopharyngeal swab, presence of viral mutation(s) within the areas targeted by this assay, and inadequate number of viral copies(<138 copies/mL). A negative result must be combined with clinical observations, patient history, and epidemiological information. The expected result is Negative.  Fact Sheet for Patients:  BloggerCourse.com  Fact Sheet for Healthcare Providers:  SeriousBroker.it  This test is no t yet approved or cleared by the [place]  FDA and  has been authorized for detection and/or diagnosis of SARS-CoV-2 by FDA under an Emergency Use Authorization (EUA). This EUA will remain  in effect (meaning this test can be used) for the duration of the COVID-19 declaration under Section 564(b)(1) of the Act, 21 U.S.C.section 360bbb-3(b)(1), unless the authorization is terminated  or revoked sooner.       Influenza A by PCR  NEGATIVE NEGATIVE Final   Influenza B by PCR NEGATIVE NEGATIVE Final    Comment: (NOTE) The Xpert Xpress SARS-CoV-2/FLU/RSV plus assay is intended as an aid in the diagnosis of influenza from Nasopharyngeal swab specimens and should not be used as a sole basis for treatment. Nasal washings and aspirates are unacceptable for Xpert Xpress SARS-CoV-2/FLU/RSV testing.  Fact Sheet for Patients: BloggerCourse.com  Fact Sheet for Healthcare Providers: SeriousBroker.it  This test is not yet approved or cleared by the [place]  FDA and has been authorized for detection and/or diagnosis of SARS-CoV-2 by FDA under an Emergency Use Authorization (EUA). This EUA will remain in effect (meaning this test can be used) for the duration of the COVID-19 declaration under Section 564(b)(1) of the Act, 21 U.S.C. section 360bbb-3(b)(1), unless the authorization is  terminated or revoked.     Resp Syncytial Virus by PCR NEGATIVE NEGATIVE Final    Comment: (NOTE) Fact Sheet for Patients: BloggerCourse.com  Fact Sheet for Healthcare Providers: SeriousBroker.it  This test is not yet approved or cleared by the [place]  FDA and has been authorized for detection and/or diagnosis of SARS-CoV-2 by FDA under an Emergency Use Authorization (EUA). This EUA will remain in effect (meaning this test can be used) for the duration of the COVID-19 declaration under Section 564(b)(1) of the Act, 21 U.S.C. section 360bbb-3(b)(1), unless the authorization is terminated or revoked.  Performed at [facility], [address] [place], [place] [address]          Radiology Studies: ECHOCARDIOGRAM COMPLETE Result Date: 08/18/2024    ECHOCARDIOGRAM REPORT   Patient Name:   Terry [name] Date of Exam: 08/18/2024 Medical Rec #:  [id number]    Height:       67.0 in Accession #:    [id number]   Weight:        180.8 lb Date of Birth:  01-Jun-1951   BSA:          1.937 m Patient Age:    72 years     BP:           120/70 mmHg Patient Gender: M            HR:           87 bpm. Exam Location:  [facility] Procedure: 2D Echo, Cardiac Doppler, Color Doppler and Intracardiac            Opacification Agent (Both Spectral and Color Flow Doppler were            utilized during procedure). Indications:     Stroke I63.9  History:         Patient has prior history of Echocardiogram examinations, most                  recent 04/02/2021. Stroke; Mitral Valve Disease.  Sonographer:     Rosina Dunk Referring Phys:  [id number] CORT ONEIDA MANA Diagnosing Phys: Cara JONETTA Lovelace MD  Sonographer Comments: Technically difficult study due to poor echo windows. IMPRESSIONS  1. TDS.  2. Left ventricular ejection fraction, by estimation, is 40 to 45%. The left ventricle has mildly decreased function. The left ventricle demonstrates global hypokinesis. The left ventricular internal cavity size was mildly dilated. There is moderate asymmetric left ventricular hypertrophy of the inferior segment. Left ventricular diastolic parameters are consistent with Grade I diastolic dysfunction (impaired relaxation).  3. Right ventricular systolic function is normal. The right ventricular size is normal.  4. The mitral valve has been repaired/replaced. Mild mitral valve regurgitation. Moderate to severe mitral stenosis.  5. The aortic valve is calcified. Aortic valve regurgitation is trivial. Moderate aortic valve stenosis. Conclusion(s)/Recommendation(s): Poor windows for evaluation of left ventricular function by transthoracic echocardiography. Would recommend an alternative means of evaluation. FINDINGS  Left Ventricle: Left ventricular ejection fraction, by estimation, is 40 to 45%. The left ventricle has mildly decreased function. The left ventricle demonstrates global hypokinesis. Definity  contrast agent was given IV to delineate the left ventricular   endocardial borders. Strain was performed and the global longitudinal strain is indeterminate. The left ventricular internal cavity size was mildly dilated. There is moderate asymmetric left ventricular hypertrophy of the inferior segment. Left ventricular diastolic parameters are consistent with Grade I diastolic dysfunction (impaired relaxation). Right Ventricle: The right ventricular size is normal.  No increase in right ventricular wall thickness. Right ventricular systolic function is normal. Left Atrium: Left atrial size was normal in size. Right Atrium: Right atrial size was normal in size. Pericardium: There is no evidence of pericardial effusion. Mitral Valve: The mitral valve has been repaired/replaced. Mild mitral valve regurgitation. There is a bioprosthetic valve present in the mitral position. Moderate to severe mitral valve stenosis. MV peak gradient, 21.7 mmHg. The mean mitral valve gradient is 12.0 mmHg. Tricuspid Valve: The tricuspid valve is normal in structure. Tricuspid valve regurgitation is trivial. Aortic Valve: The aortic valve is calcified. Aortic valve regurgitation is trivial. Moderate aortic stenosis is present. Aortic valve mean gradient measures 12.0 mmHg. Aortic valve peak gradient measures 22.3 mmHg. Aortic valve area, by VTI measures 1.02  cm. Pulmonic Valve: The pulmonic valve was grossly normal. Pulmonic valve regurgitation is trivial. Aorta: The ascending aorta was not well visualized. IAS/Shunts: No atrial level shunt detected by color flow Doppler. Additional Comments: TDS. 3D was performed not requiring image post processing on an independent workstation and was indeterminate.  LEFT VENTRICLE PLAX 2D LVIDd:         5.30 cm LVIDs:         4.80 cm LV PW:         1.40 cm LV IVS:        1.00 cm LVOT diam:     2.30 cm LV SV:         38 LV SV Index:   20 LVOT Area:     4.15 cm  RIGHT VENTRICLE RV S prime:     5.11 cm/s TAPSE (M-mode): 0.8 cm AORTIC VALVE                      PULMONIC VALVE AV Area (Vmax):    0.99 cm      PV Vmax:        1.12 m/s AV Area (Vmean):   0.92 cm      PV Vmean:       78.300 cm/s AV Area (VTI):     1.02 cm      PV VTI:         0.196 m AV Vmax:           236.33 cm/s   PV Peak grad:   5.0 mmHg AV Vmean:          160.000 cm/s  PV Mean grad:   3.0 mmHg AV VTI:            0.376 m       RVOT Peak grad: 4 mmHg AV Peak Grad:      22.3 mmHg AV Mean Grad:      12.0 mmHg LVOT Vmax:         56.10 cm/s LVOT Vmean:        35.600 cm/s LVOT VTI:          0.092 m LVOT/AV VTI ratio: 0.25  AORTA Ao Root diam: 3.80 cm Ao Asc diam:  3.00 cm MITRAL VALVE                TRICUSPID VALVE MV Area (PHT): 4.60 cm     TR Peak grad:   21.9 mmHg MV Area VTI:   0.93 cm     TR Mean grad:   13.0 mmHg MV Peak grad:  21.7 mmHg    TR Vmax:        234.00 cm/s MV Mean grad:  12.0  mmHg    TR Vmean:       169.0 cm/s MV Vmax:       2.33 m/s MV Vmean:      172.0 cm/s   SHUNTS MV Decel Time: 165 msec     Systemic VTI:  0.09 m MV E velocity: 133.00 cm/s  Systemic Diam: 2.30 cm MV A velocity: 197.00 cm/s  Pulmonic VTI:  0.167 m MV E/A ratio:  0.68 Dwayne JONETTA Lovelace MD Electronically signed by Cara JONETTA Lovelace MD Signature Date/Time: 08/18/2024/3:06:23 PM    Final    CT Head Wo Contrast Result Date: 08/18/2024 CLINICAL DATA:  73 year old male with fall and neurologic deficits. Left posterior corona radiata/basal ganglia lacunar infarct on MRI this morning. Worsening neurologic symptoms. EXAM: CT HEAD WITHOUT CONTRAST TECHNIQUE: Contiguous axial images were obtained from the base of the skull through the vertex without intravenous contrast. RADIATION DOSE REDUCTION: This exam was performed according to the departmental dose-optimization program which includes automated exposure control, adjustment of the mA and/or kV according to patient size and/or use of iterative reconstruction technique. COMPARISON:  Brain MRI 08/18/2024, head CT 0754 hours today. FINDINGS: Brain: Hypodensity posterior left corona  radiata tracking to the lentiform which has progressed since 0343 hours today on CT (series 2, image 13), and probably is slightly larger since than the DWI abnormality at 0541 hours today (compare to series 5, image 27 at that time). No hemorrhagic transformation. No mass effect. Stable gray-white differentiation elsewhere including chronic left thalamic lacunar infarct, chronic left operculum infarct. No intracranial mass effect or ventriculomegaly. Vascular: Residual intravascular contrast from CTA this morning. Skull: Stable and intact. Sinuses/Orbits: Visualized paranasal sinuses and mastoids are stable and well aerated. Other: No acute orbit or scalp soft tissue finding. IMPRESSION: 1. Evidence of mild extension of the left deep white and deep gray matter lacunar infarct since DWI MRI 0544 hours today. No hemorrhagic transformation or mass effect. 2. No new intracranial abnormality identified. Background chronic ischemic disease. Electronically Signed   By: VEAR Hurst M.D.   On: 08/18/2024 12:46   US  RENAL Result Date: 08/18/2024 CLINICAL DATA:  Acute renal injury EXAM: RENAL / URINARY TRACT ULTRASOUND COMPLETE COMPARISON:  None Available. FINDINGS: Right Kidney: Renal measurements: 9.9 x 4.2 x 5.1 = volume: 110 mL. Echogenicity within normal limits. No mass or hydronephrosis visualized. Left Kidney: Renal measurements: 10.2 x 5.7 x 5.4 = volume: 167 mL. Mild LEFT hydronephrosis. 2 cm anechoic cyst in lower pole. Bladder: Appears normal for degree of bladder distention. Bilateral ureteral jets identified. Other: None. IMPRESSION: 1. Mild LEFT hydronephrosis. 2. Benign LEFT renal cyst. 3. Normal RIGHT kidney. 4. Bilateral ureteral jets identified. Electronically Signed   By: Jackquline Boxer M.D.   On: 08/18/2024 11:16   CT Soft Tissue Neck W Contrast Result Date: 08/18/2024 CLINICAL DATA:  Anterior neck swelling and throat pain EXAM: CT NECK WITH CONTRAST TECHNIQUE: Multidetector CT imaging of the neck was  performed using the standard protocol following the bolus administration of intravenous contrast. RADIATION DOSE REDUCTION: This exam was performed according to the departmental dose-optimization program which includes automated exposure control, adjustment of the mA and/or kV according to patient size and/or use of iterative reconstruction technique. CONTRAST:  75mL OMNIPAQUE  IOHEXOL  350 MG/ML SOLN COMPARISON:  CT angiography same day FINDINGS: Pharynx and larynx: This examination suffers from some motion degradation. No evidence of mucosal or submucosal mass or inflammatory disease. Salivary glands: Parotid and submandibular glands are normal. Thyroid: Normal. Lymph nodes: No  lymphadenopathy on either side of the neck. Normal cervical chain nodes. Vascular: Atherosclerotic calcification at the carotid bifurcations as evaluated by CT angiography. Limited intracranial: Negative Visualized orbits: Normal Mastoids and visualized paranasal sinuses: Clear Skeleton: Ordinary spondylosis and facet arthritis. Upper chest: Clear Other: No evidence of superficial soft tissue edema, inflammation or mass. No abnormality seen to explain the clinical concern. IMPRESSION: 1. No abnormality seen to explain the clinical concern. No evidence of mass or lymphadenopathy. 2. Atherosclerotic calcification at the carotid bifurcations as evaluated by CT angiography. 3. Ordinary spondylosis and facet arthritis of the cervical spine. Electronically Signed   By: Oneil Officer M.D.   On: 08/18/2024 08:20   CT ANGIO HEAD NECK W WO CM Result Date: 08/18/2024 CLINICAL DATA:  Neuro deficit, acute, stroke suspected. Anterior neck swelling and throat pain. Fall. Acute small vessel infarction of the left brain. EXAM: CT ANGIOGRAPHY HEAD AND NECK WITH AND WITHOUT CONTRAST TECHNIQUE: Multidetector CT imaging of the head and neck was performed using the standard protocol during bolus administration of intravenous contrast. Multiplanar CT image  reconstructions and MIPs were obtained to evaluate the vascular anatomy. Carotid stenosis measurements (when applicable) are obtained utilizing NASCET criteria, using the distal internal carotid diameter as the denominator. RADIATION DOSE REDUCTION: This exam was performed according to the departmental dose-optimization program which includes automated exposure control, adjustment of the mA and/or kV according to patient size and/or use of iterative reconstruction technique. CONTRAST:  75mL OMNIPAQUE  IOHEXOL  350 MG/ML SOLN COMPARISON:  Head CT and brain MRI earlier same day FINDINGS: CT HEAD FINDINGS Brain: No focal abnormality seen affecting the brainstem or cerebellum. Old infarction in the left frontal operculum. Acute infarction within the left external capsule and body of the caudate visible as subtle low density. No evidence of hemorrhagic transformation or mass effect. No hydrocephalus. No extra-axial collection. Vascular: There is atherosclerotic calcification of the major vessels at the base of the brain. Skull: Negative Sinuses/Orbits: Clear/normal Other: None Review of the MIP images confirms the above findings CTA NECK FINDINGS Aortic arch: Aortic atherosclerosis. Branching pattern is normal without origin stenosis. Right carotid system: Common carotid artery widely patent to the bifurcation. Calcified plaque at the carotid bifurcation and ICA bulb but no stenosis when compared to the more distal cervical ICA diameter. Left carotid system: Common carotid artery widely patent to the bifurcation. Calcified plaque at the carotid bifurcation and ICA bulb but no stenosis. Cervical ICA widely patent beyond that Vertebral arteries: No proximal subclavian stenosis. Both vertebral artery origins are widely patent. Calcified plaque adjacent to the right vertebral artery origin. Both vertebral arteries are patent through the cervical region without stenosis. Skeleton: Ordinary spondylosis C5-6. Mild facet  osteoarthritis right worse than left. Other neck: See results of neck CT. Upper chest: Minimal emphysema in the upper lobes. Review of the MIP images confirms the above findings CTA HEAD FINDINGS Anterior circulation: Both internal carotid arteries are patent through the skull base and siphon regions. Mild siphon atherosclerotic calcification but no stenosis. The anterior and middle cerebral vessels are patent. No large vessel occlusion or proximal stenosis. Some motion degradation towards the vertex. Posterior circulation: Both vertebral arteries are patent through the foramen magnum to the basilar artery. No basilar stenosis. Posterior circulation branch vessels are patent. Venous sinuses: Patent and normal. Anatomic variants: None significant. Review of the MIP images confirms the above findings IMPRESSION: 1. No intracranial large vessel occlusion or proximal stenosis. 2. Acute infarction in the left external capsule and body of  the caudate visible as subtle low density. No evidence of hemorrhagic transformation or mass effect. 3. Old infarction in the left frontal operculum. 4. Atherosclerotic calcification at the carotid bifurcations and ICA bulbs but no stenosis. Aortic Atherosclerosis (ICD10-I70.0). Electronically Signed   By: Oneil Officer M.D.   On: 08/18/2024 08:17   MR BRAIN WO CONTRAST Result Date: 08/18/2024 CLINICAL DATA:  73 year old male status post fall. Neurologic deficit. EXAM: MRI HEAD WITHOUT CONTRAST TECHNIQUE: Multiplanar, multiecho pulse sequences of the brain and surrounding structures were obtained without intravenous contrast. COMPARISON:  Head CT 0343 hours today.  Brain MRI 04/01/2021. FINDINGS: Brain: Patchy chronic left MCA middle division cortical encephalomalacia, primarily affecting the operculum, stable since 2022. Chronic lacunar infarcts in the bilateral caudate nuclei are stable since 2022. Evolution of left thalamic lacunar infarct since the previous MRI. Multiple small  bilateral cerebellar infarcts are new or increased since 2022. small chronic right inferior parietal lobe cortical encephalomalacia is stable. Patchy acute lacunar type infarct tracking from the posterior left corona radiata into the left lentiform (series 5 images 31-27. mild T2 and FLAIR hyperintensity. No hemorrhage or mass effect. No other diffusion restriction. No midline shift, mass effect, evidence of mass lesion, ventriculomegaly, extra-axial collection or acute intracranial hemorrhage. Cervicomedullary junction and pituitary are within normal limits. Vascular: Major intracranial vascular flow voids are stable since 2022. Skull and upper cervical spine: Chronic lacunar infarcts in the bilateral caudate nuclei are stable since 2022. Evolution of left thalamic lacunar infarct since the previous MRI. Small bilateral cerebellar infarcts are new or increased since 2022. Patchy acute lacunar type infarct tracking from the posterior left corona radiata into the left lentiform (series 5 images 31-27. Sinuses/Orbits: Negative. Visualized bone marrow signal is within normal limits. Other: Stable, negative. IMPRESSION: 1. Acute lacunar infarct tracking from the posterior left corona radiata into the left lentiform. No associated hemorrhage or mass effect. 2. Underlying chronic small and medium-sized vessel ischemia, including a previous left MCA territory operculum infarct. 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 Electronically Signed   By: VEAR Hurst M.D.   On: 08/18/2024 06:51   CT HEAD WO CONTRAST ( ) Result Date: 08/18/2024 CLINICAL DATA:  Neuro deficit. Fell from standing. Right-side feels funny. Weakness. EXAM: CT HEAD WITHOUT CONTRAST TECHNIQUE: Contiguous axial images were obtained from the base of the skull through the vertex without intravenous contrast. RADIATION DOSE REDUCTION: This exam was performed according to the departmental dose-optimization program which includes  automated exposure control, adjustment of the mA and/or kV according to patient size and/or use of iterative reconstruction technique. COMPARISON:  CT 04/01/2021 FINDINGS: Brain: No intracranial hemorrhage, mass effect, or evidence of acute infarct. No hydrocephalus. No extra-axial fluid collection. Chronic left frontal infarct. Vascular: No hyperdense vessel or unexpected calcification. Skull: No fracture or focal lesion. Sinuses/Orbits: No acute finding. Other: None. IMPRESSION: No acute intracranial abnormality. Electronically Signed   By: Norman Gatlin M.D.   On: 08/18/2024 03:56        Scheduled Meds:  aspirin  EC  81 mg Oral Daily   atorvastatin   80 mg Oral QHS   enoxaparin  (LOVENOX ) injection  40 mg Subcutaneous Q24H   melatonin  5 mg Oral QHS   pantoprazole   40 mg Oral Daily   Continuous Infusions:   LOS: 0 days     Anthony CHRISTELLA Pouch, MD [facility] Pager [phone]  If 7PM-7AM, please contact night-coverage [email or web address] 08/19/2024, 1:57 PM

## 2024-08-19 NOTE — Progress Notes (Signed)
 Occupational Therapy Treatment Patient Details Name: Terry [name] MRN: [id number] DOB: 08/19/1951 Today's Date: 08/19/2024   History of present illness Pt is a 73 y.o. male presented with new onset of right-sided weakness and fall. MRI this AM confirmed lacunar infarct and stat CT completed around 1230pm with evidence of mild extension of the left deep white and deep gray matter lacunar infarct. Cleared by neuro for participation with evaluation. 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 Session focused on neuromuscular re-education of dominant RUE. Pt placed in gravity-eliminated supine position to work on activation proximally to distally. Pt incorporating vision to encourage sensorimotor feedback to hemiplegic RUE during all functional exercises with PROM assist from OT. Pt does demonstrate trace activation for scapular protraction/retraction and triceps during shoulder flexion to 90 degrees while supine. Pt works on shoulder flexion, functional reach with punches towards ceiling, and elbow flexion/extension. Pt educated on RUE positioning strategies to promote neutral alignment of wrist and digits, and to protect shoulder joint from subluxation while minimizing edema, verbalizes understanding. Pt is motivated, and demonstrates excellent rehabilitation potential from intensive therapy to continue functional independence. Discharge recommendation remains appropriate, patient will benefit from intensive inpatient follow-up therapy, >3 hours/day       If plan is discharge home, recommend the following:  A lot of help with bathing/dressing/bathroom;Two people to help with walking and/or transfers;Help with stairs or ramp for  entrance;Assistance with cooking/housework   Equipment Recommendations  Other (comment)    Recommendations for Other Services Rehab consult    Precautions / Restrictions Precautions Precautions: Fall Precaution/Restrictions Comments: R hemiplegia Restrictions Weight Bearing Restrictions Per Provider Order: No       Mobility Bed Mobility Overal bed mobility: Needs Assistance                  Transfers Overall transfer level: Needs assistance                       Balance Overall balance assessment: Needs assistance                                         ADL either performed or assessed with clinical judgement   ADL                                         General ADL Comments: Session focused on neuro-reducation of hemiplegic RUE, positioning strategies and recovery timeline. Pt states he has been OOB several times today with +2 assist for SPT to BSC/recliner.    Extremity/Trunk Assessment Upper Extremity Assessment RUE Deficits / Details: trace activation in supine of tricep, abductor pollis brevis, scapular protraction/retraction                     Communication Communication Communication: Impaired Factors Affecting Communication: Reduced clarity of speech (mild dysarthria due to R facial droop)   Cognition Arousal: Alert Behavior During Therapy: WFL for tasks assessed/performed Cognition: No apparent impairments  OT - Cognition Comments: mild memory deficits from previous CVA                 Following commands: Intact        Cueing   Cueing Techniques: Verbal cues  Exercises Exercises: General Upper Extremity General Exercises - Upper Extremity Shoulder Flexion: Supine, PROM, 15 reps, Right Elbow Flexion: Supine, Right, PROM, 10 reps Elbow Extension: Supine, Right, PROM Wrist Flexion: PROM, Supine, 10 reps Wrist Extension: 10 reps, PROM, Supine            Pertinent  Vitals/ Pain       Pain Assessment Pain Assessment: No/denies pain  Home Living     Available Help at Discharge: Family;Available 24 hours/day Type of Home: House                              Lives With: Spouse        Frequency  Min 3X/week        Progress Toward Goals  OT Goals(current goals can now be found in the care plan section)  Progress towards OT goals: Progressing toward goals  Acute Rehab OT Goals OT Goal Formulation: With patient/family Time For Goal Achievement: 09/01/24 Potential to Achieve Goals: Fair ADL Goals Pt Will Perform Grooming: sitting;with min assist Pt Will Transfer to Toilet: with mod assist;stand pivot transfer;bedside commode Additional ADL Goal #1: Pt will demo ability to perform RUE PROM HEP with independence to maximize tone, ROM and strength and hopefully increase functional use  Plan         AM-PAC OT 6 Clicks Daily Activity     Outcome Measure   Help from another person eating meals?: A Little Help from another person taking care of personal grooming?: A Lot Help from another person toileting, which includes using toliet, bedpan, or urinal?: A Lot Help from another person bathing (including washing, rinsing, drying)?: A Lot Help from another person to put on and taking off regular upper body clothing?: A Lot Help from another person to put on and taking off regular lower body clothing?: A Lot 6 Click Score: 13    End of Session    OT Visit Diagnosis: Other abnormalities of gait and mobility (R26.89);Unsteadiness on feet (R26.81);Muscle weakness (generalized) (M62.81);Hemiplegia and hemiparesis Hemiplegia - Right/Left: Right Hemiplegia - dominant/non-dominant: Dominant Hemiplegia - caused by: Cerebral infarction   Activity Tolerance Patient tolerated treatment well   Patient Left in bed;with call bell/phone within reach;with bed alarm set   Nurse Communication Mobility status        Time: 1545-1610 OT  Time Calculation (min): 25 min  Charges: OT General Charges $OT Visit: 1 Visit OT Treatments $Neuromuscular Re-education: 23-37 mins  Lashon Beringer L. Wendelyn Kiesling, OTR/L  08/19/24, 4:46 PM

## 2024-08-19 NOTE — Plan of Care (Signed)
  Problem: Education: Goal: Knowledge of disease or condition will improve Outcome: Progressing Goal: Knowledge of secondary prevention will improve (MUST DOCUMENT ALL) Outcome: Progressing Goal: Knowledge of patient specific risk factors will improve (DELETE if not current risk factor) Outcome: Progressing   Problem: Ischemic Stroke/TIA Tissue Perfusion: Goal: Complications of ischemic stroke/TIA will be minimized Outcome: Progressing   Problem: Coping: Goal: Will verbalize positive feelings about self Outcome: Progressing Goal: Will identify appropriate support needs Outcome: Progressing   Problem: Health Behavior/Discharge Planning: Goal: Ability to manage health-related needs will improve Outcome: Progressing Goal: Goals will be collaboratively established with patient/family Outcome: Progressing   Problem: Self-Care: Goal: Ability to participate in self-care as condition permits will improve Outcome: Progressing Goal: Verbalization of feelings and concerns over difficulty with self-care will improve Outcome: Progressing Goal: Ability to communicate needs accurately will improve Outcome: Progressing   Problem: Nutrition: Goal: Risk of aspiration will decrease Outcome: Progressing Goal: Dietary intake will improve Outcome: Progressing   Problem: Education: Goal: Knowledge of General Education information will improve Description: Including pain rating scale, medication(s)/side effects and non-pharmacologic comfort measures Outcome: Progressing   Problem: Health Behavior/Discharge Planning: Goal: Ability to manage health-related needs will improve Outcome: Progressing   Problem: Clinical Measurements: Goal: Ability to maintain clinical measurements within normal limits will improve Outcome: Progressing Goal: Will remain free from infection Outcome: Progressing Goal: Diagnostic test results will improve Outcome: Progressing Goal: Respiratory complications will  improve Outcome: Progressing Goal: Cardiovascular complication will be avoided Outcome: Progressing   Problem: Activity: Goal: Risk for activity intolerance will decrease Outcome: Progressing   Problem: Nutrition: Goal: Adequate nutrition will be maintained Outcome: Progressing   Problem: Coping: Goal: Level of anxiety will decrease Outcome: Progressing   Problem: Elimination: Goal: Will not experience complications related to bowel motility Outcome: Progressing Goal: Will not experience complications related to urinary retention Outcome: Progressing   Problem: Pain Managment: Goal: General experience of comfort will improve and/or be controlled Outcome: Progressing   Problem: Safety: Goal: Ability to remain free from injury will improve Outcome: Progressing   Problem: Skin Integrity: Goal: Risk for impaired skin integrity will decrease Outcome: Progressing   Problem: Education: Goal: Knowledge of General Education information will improve Description: Including pain rating scale, medication(s)/side effects and non-pharmacologic comfort measures Outcome: Progressing

## 2024-08-19 NOTE — Evaluation (Signed)
 Clinical/Bedside Swallow Evaluation Patient Details  Name: Terry [name] MRN: [id number] Date of Birth: April 21, 1951  Today's Date: 08/19/2024 Time: SLP Start Time (ACUTE ONLY): 1130 SLP Stop Time (ACUTE ONLY): 1210 SLP Time Calculation (min) (ACUTE ONLY): 40 min  Past Medical History:  Past Medical History:  Diagnosis Date   Diverticulosis    GERD (gastroesophageal reflux disease)    Heart murmur    Hypercholesteremia    Hypertension    Mitral valve prolapse    Stroke [facility])    Past Surgical History:  Past Surgical History:  Procedure Laterality Date   COLONOSCOPY WITH PROPOFOL  N/A 03/06/2020   Procedure: COLONOSCOPY WITH PROPOFOL ;  Surgeon: Toledo, Ladell MARLA, MD;  Location: [facility] ENDOSCOPY;  Service: Gastroenterology;  Laterality: N/A;   MITRAL VALVE REPLACEMENT     RETINAL DETACHMENT SURGERY     RIGHT/LEFT HEART CATH AND CORONARY ANGIOGRAPHY Bilateral 02/18/2017   Procedure: Right/Left Heart Cath and Coronary Angiography;  Surgeon: Marsa Dooms, MD;  Location: [facility] INVASIVE CV LAB;  Service: Cardiovascular;  Laterality: Bilateral;   RIGHT/LEFT HEART CATH AND CORONARY ANGIOGRAPHY N/A 02/22/2020   Procedure: RIGHT/LEFT HEART CATH AND CORONARY ANGIOGRAPHY;  Surgeon: Dooms Marsa, MD;  Location: [facility] INVASIVE CV LAB;  Service: Cardiovascular;  Laterality: N/A;   TEMPOROMANDIBULAR JOINT SURGERY     HPI:  Pt is a 73 y.o. 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  No c/o difficulty swallowing.   MRI brain showed lacunar infarction in the left frontal radiata/lentiform nuclei with mild expansion on follow-up CT.    Assessment / Plan / Recommendation  Clinical Impression  Pt seen for BSE today during session. Pt awake, A/Ox4. Family present; Son. Pt verbal and engaged appropriately in conversation w/ Staff and Family. He followed instructions appropriately; noted Dysarthria and R oral motor weakness. 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 Pt has been tolerating a mech soft diet appropriately.  On RA, afebrile. WBC WNL.  Pt appears to present w/ functional oropharyngeal phase swallowing w/ No overt oropharyngeal phase dysphagia noted, No gross sensorimotor deficits to impact swallowing noted. Pt consumed po trials w/ No overt, clinical s/s of aspiration during the po trials.  Pt appears at reduced risk for aspiration following general aspiration precautions and w/ tray setup/prep. However, pt does have challenging factors that could impact oropharyngeal swallowing to include hospitalization, deconditioning, RUE flaccidness/weakness w/ inability to feed self w/ dominant hand, and new CVA w/ R oral weakness/decreased tone. These factors can increase risk for dysphagia as well as decreased oral intake overall.   During po trials, pt consumed all consistencies w/ no overt coughing, decline in vocal quality, or change in respiratory presentation during/post trials. O2 sats 99% when checked. Oral phase appeared grossly [facility] w/ timely bolus management, mastication, and control of bolus propulsion for A-P transfer for swallowing. Pt noted he has to sweep around to get the food out at  home b/c of these Dentures- Baseline. He did not c/o any increased bolus residue on R side of mouth/buccal area. Oral clearing achieved w/ all  trial consistencies -- moistened, soft foods in Small bites given. Pt fed self w/ MOD setup support.  OM exam was revealed Right lingual/labial weakness and decreased ROM/Symmetry; CNs 7 and 12. Min lingual deviation to the R noted(unsure if new?); lingual strength/ROM appeared WFL in isolated movements, but suspect decreased in connected movements such as speech. Decreased R labial-facial tone/strength noted. Pt endorsed min R labial wetness/drooling at lips though no overt drooling noted during session.   Recommend a fairly Regular consistency diet w/ well-Cut meats, moistened foods; Thin liquids -- monitor straw use, and pt should use Cup to drink if indicated. Recommend general aspiration precautions to include Small bites/sips Slowly and ensure oral clearing using lingual/finger sweep when needed. Must sit fully upright for all oral intake. FULL TRAY/MEAL PREP at meals d/t pt unable to use RUE. Less distractions and talking during meals. Pills WHOLE in Puree for best oral control and safer swallowing -- it is encouraged now and for D/C to the pt.  Education given on Pills in Puree; food consistencies and easy to eat options; general aspiration precautions; tray prep needs to pt. MD/NSG to reconsult if any new needs arise. NSG updated, agreed. MD updated. Recommend Dietician f/u for support. Precautions posted in chart, room.  SLP Visit Diagnosis: Dysphagia, unspecified (R13.10)    Aspiration Risk   (reduced when following general aspiration precautions; setup/prep support)    Diet Recommendation   Thin;Age appropriate regular (cut/prepped tray) = a fairly Regular consistency diet w/ well-Cut meats, moistened foods; Thin liquids -- monitor straw use, and pt should use Cup to drink if indicated. Recommend general aspiration precautions to include Small bites/sips Slowly and ensure oral clearing using lingual/finger sweep when needed. Must sit fully upright for all oral intake. FULL TRAY/MEAL PREP at  meals d/t pt unable to use RUE. Less distractions and talking during meals.   Medication Administration: Whole meds with puree (for best control)    Other  Recommendations Recommended Consults:  (Dietician if indicated) Oral Care Recommendations: Oral care BID;Oral care before and after PO;Patient independent with oral care;Staff/trained caregiver to provide oral care (setup and support d/t RUE)     Assistance Recommended at Discharge Tray setup and prep at meals  Functional Status Assessment Patient has had a recent decline in their functional status and demonstrates the ability to make significant improvements in function in a reasonable and predictable amount of time.  Frequency and Duration  (n/a)   (n/a)       Prognosis Prognosis for improved oropharyngeal function: Good Barriers to Reach Goals: Time post onset;Severity of deficits Barriers/Prognosis Comment: R oral weakness; unable to use RUE for self-feeding      Swallow Study   General Date of Onset: 08/18/24 HPI: Pt is a 73 y.o. male past history of paroxysmal atrial fibrillation on Eliquis  compliant to medications, also on aspirin , GERD, HTN, mitral valve prolapse status post mitral valve replacement 2021, prior multiple left hemispheric strokes involving left MCA, left PCA and left ACA territories in 2021 and 2022 with ongoing gait imbalance and some memory issues as well as Dysarthria and mild expressive aphasia at the higher executive level functioning then now presents for evaluation of sudden onset of right-sided weakness which worsened while in the hospital and progressed to complete flaccidity of the right upper and lower extremity with more prominent right  facial weakness.  No c/o difficulty swallowing.   MRI brain showed lacunar infarction in the left frontal radiata/lentiform nuclei with mild expansion on follow-up CT. Type of Study: Bedside Swallow Evaluation Previous Swallow Assessment: mbss 2022- regular diet w/ thin  liquids rec'd. Diet Prior to this Study: Dysphagia 3 (mechanical soft);Thin liquids (Level 0) (at admit) Temperature Spikes Noted: No (wbc wnl) Respiratory Status: Room air History of Recent Intubation: No Behavior/Cognition: Alert;Cooperative;Pleasant mood (x4) Oral Cavity Assessment: Within Functional Limits Oral Care Completed by SLP: Recent completion by staff Oral Cavity - Dentition: Dentures, top;Dentures, bottom Vision: Functional for self-feeding Self-Feeding Abilities: Able to feed self;Needs assist;Needs set up (unable to use RUE- flaccid) Patient Positioning: Upright in bed (supported) Baseline Vocal Quality: Normal Volitional Cough: Strong Volitional Swallow: Able to elicit    Oral/Motor/Sensory Function Overall Oral Motor/Sensory Function: Moderate impairment Facial ROM: Reduced right;Suspected CN VII (facial) dysfunction Facial Symmetry: Abnormal symmetry right;Suspected CN VII (facial) dysfunction Facial Strength: Reduced right;Suspected CN VII (facial) dysfunction Facial Sensation: Within Functional Limits Lingual ROM: Within Functional Limits Lingual Symmetry: Abnormal symmetry right;Suspected CN XII (hypoglossal) dysfunction (min) Lingual Strength: Within Functional Limits Lingual Sensation: Within Functional Limits Velum: Within Functional Limits Mandible: Within Functional Limits   Ice Chips Ice chips: Within functional limits Presentation: Spoon (fed; 2 trials)   Thin Liquid Thin Liquid: Within functional limits Presentation: Self Fed;Cup;Straw (~4-5 ozs) Other Comments: water- no overt anterior leakage but pt wiped at his mouth intermittently    Nectar Thick Nectar Thick Liquid: Not tested   Honey Thick Honey Thick Liquid: Not tested   Puree Puree: Within functional limits Presentation: Self Fed;Spoon (8 trials)   Solid     Solid: Within functional limits Presentation: Self Fed (8 trials) Other Comments: moistened, cut pieces        [name], MS, CCC-SLP Speech Language Pathologist Rehab Services; [facility] [place] [phone] (ascom) Whitni Pasquini 08/19/2024,2:44 PM

## 2024-08-19 NOTE — Progress Notes (Signed)
 Inpatient Rehab Admissions:  Inpatient Rehab Consult received.  I briefly spoke to pt on the telephone for rehabilitation assessment and to discuss goals and expectations of an inpatient rehab admission.  Pt was participating in therapy so [facility] spoke with pt's son Adina. Discussed average length of stay, insurance authorization requirement and discharge home after completion of CIR. Matt acknowledged understanding. Adina is supportive of pt pursuing CIR. Matt confirmed that family will be able to provide support for pt after discharge. Will continue to follow.  Signed: Tinnie Yvone Cohens, MS, CCC-SLP Admissions Coordinator [phone]

## 2024-08-19 NOTE — Progress Notes (Signed)
 Noted that patient is having speaking when doing the NIH. Also noted that he is rolling his tongue after talking . He was not doing any of these with 2000 check.  Secure chat to [facility] APP

## 2024-08-19 NOTE — PMR Pre-admission (Shared)
 PMR Admission Coordinator Pre-Admission Assessment  Patient: Terry [name] is an 73 y.o., male MRN: [id number] DOB: 08-25-1951 Height: 5' 7 (170.2 cm) Weight: 82 kg  Insurance Information HMO: ***    PPO: ***     PCP:      IPA:      80/20:      OTHER:  PRIMARY: [employer]      Policy#: [id number]      Subscriber: patient CM Name: ***      Phone#: ***     Fax#: *** Pre-Cert#: ***      Employer: *** Benefits:  Phone #: ***     Name: *** Eustacio. Date: ***     Deduct: ***      Out of Pocket Max: ***      Life Max: *** CIR: ***      SNF: *** Outpatient: ***     Co-Pay: *** Home Health: ***      Co-Pay: *** DME: ***     Co-Pay: *** Providers: in-network SECONDARY:       Policy#:      Phone#:   Financial Counselor:       Phone#:   The Data processing manager" for patients in Inpatient Rehabilitation Facilities with attached "Privacy Act Statement-Health Care Records" was provided and verbally reviewed with: {CHL IP Patient Family WJ:[id number]}  Emergency Contact Information Contact Information     Name Relation Home Work Mobile   [name],[name] Spouse [phone]     [name],[name] Son [phone]     Jama Rock Ahumada [phone]        Other Contacts   None on File     Current Medical History  Patient Admitting Diagnosis: CVA History of Present Illness: Pt is a 73 year old male with medical hx significant for: A-fib on Eliquis , HTN, mitral valve prolapse s/p mitral valve replacement in 2021, hyperlipidemia, prior stroke. Pt presented to [facility] on 08/18/24 d/t right-sided weakness and a fall. CT head negative for acute abnormalities. CTA negative for LVO. CT neck negative. MRI showed right lacunar stroke in right corona radiata. Repeat CT (done d/t concern for worsening right-sided weakness) showed possible mild extension of left deep white and deep gray matter lacunar infarct. Therapy evaluations completed and CIR recommended d/t pt's  deficits in functional mobility. Complete NIHSS TOTAL: 13  Patient's medical record from [facility] has been reviewed by the rehabilitation admission coordinator and physician.  Past Medical History  Past Medical History:  Diagnosis Date   Diverticulosis    GERD (gastroesophageal reflux disease)    Heart murmur    Hypercholesteremia    Hypertension    Mitral valve prolapse    Stroke [facility])     Has the patient had major surgery during 100 days prior to admission? No  Family History   family history includes Cancer in his father; Varicose Veins in his mother.  Current Medications  Current Facility-Administered Medications:    acetaminophen  (TYLENOL ) tablet 650 mg, 650 mg, Oral, Q4H PRN **OR** acetaminophen  (TYLENOL ) 160 MG/5ML solution 650 mg, 650 mg, Per Tube, Q4H PRN **OR** acetaminophen  (TYLENOL ) suppository 650 mg, 650 mg, Rectal, Q4H PRN, Laurita Manor T, MD   aspirin  EC tablet 81 mg, 81 mg, Oral, Daily, Laurita Manor T, MD, 81 mg at 08/19/24 9070   atorvastatin  (LIPITOR) tablet 80 mg, 80 mg, Oral, QHS, Zhang, Ping T, MD, 80 mg at 08/18/24 2203   enoxaparin  (LOVENOX ) injection 40 mg, 40 mg,  Subcutaneous, Q24H, Laurita Manor T, MD, 40 mg at 08/18/24 2203   melatonin tablet 5 mg, 5 mg, Oral, QHS, Zhang, Ping T, MD, 5 mg at 08/18/24 2203   pantoprazole  (PROTONIX ) EC tablet 40 mg, 40 mg, Oral, Daily, Laurita Manor T, MD, 40 mg at 08/19/24 9070   senna-docusate (Senokot-S) tablet 1 tablet, 1 tablet, Oral, QHS PRN, Laurita Manor DASEN, MD  Patients Current Diet:  Diet Order             DIET DYS 3 Room service appropriate? Yes; Fluid consistency: Thin  Diet effective now                   Precautions / Restrictions Precautions Precautions: Fall Restrictions Weight Bearing Restrictions Per Provider Order: No   Has the patient had 2 or more falls or a fall with injury in the past year? No  Prior Activity Level Community (5-7x/wk): drives; gets out of house  often  Prior Functional Level Self Care: Did the patient need help bathing, dressing, using the toilet or eating? Independent  Indoor Mobility: Did the patient need assistance with walking from room to room (with or without device)? Independent  Stairs: Did the patient need assistance with internal or external stairs (with or without device)? Independent  Functional Cognition: Did the patient need help planning regular tasks such as shopping or remembering to take medications? Independent  Patient Information    Patient's Response To:     Home Assistive Devices / Equipment Home Equipment: Grab bars - tub/shower, Agricultural consultant (2 wheels), [employer] - single point, BSC/3in1  Prior Device Use: Indicate devices/aids used by the patient prior to current illness, exacerbation or injury? None of the above  Current Functional Level Cognition  Orientation Level: Oriented X4    Extremity Assessment (includes Sensation/Coordination)  Upper Extremity Assessment:  (L UE grossly WFL; R UE grossly 0/5, sensation intact) RUE Deficits / Details: no AROM, PROM intact  Lower Extremity Assessment:  (L LE grossly WFL; R LE hip ext, adduct 1/5, otherwise 0/5.  Sensation intact.  Positive babinski R foot, negative clonus) LLE Deficits / Details: very minimal activation with MMT by PT, no AROM at bed level with OT    ADLs  Overall ADL's : Needs assistance/impaired Lower Body Dressing: Maximal assistance, Moderate assistance, Bed level Lower Body Dressing Details (indicate cue type and reason): to manage/adjust pants in bed General ADL Comments: anticipate Mod A for ADL management d/t R sided hemiplegia    Mobility  Overal bed mobility: Needs Assistance Bed Mobility: Supine to Sit, Sit to Supine Supine to sit: Max assist, +2 for physical assistance Sit to supine: Max assist, +2 for physical assistance General bed mobility comments: assist for BLE management and trunk assist d/t R sided hemiplegia and  hypotonia-essentially flaccid and requires cues to hold RUE or stabilization from PT/OT to stand and support    Transfers  Overall transfer level: Needs assistance Equipment used: 2 person hand held assist Transfers: Sit to/from Stand Sit to Stand: Mod assist, Max assist, +2 physical assistance General transfer comment: max/total assist to stabilize R hip/knee in closed-chain positions; manual facilitation to prevent excessive posterior trunk lean and maintain midline in A/P, M/L planes    Ambulation / Gait / Stairs / Wheelchair Mobility  Ambulation/Gait General Gait Details: unsafe/unable    Posture / Balance Dynamic Sitting Balance Sitting balance - Comments: min assist, close periods of cga, delayed righting reactions; frequent cuing/assist for R lateral LOB Balance Overall balance  assessment: Needs assistance Sitting-balance support: No upper extremity supported, Feet supported Sitting balance-Leahy Scale: Poor Sitting balance - Comments: min assist, close periods of cga, delayed righting reactions; frequent cuing/assist for R lateral LOB Postural control: Posterior lean, Right lateral lean Standing balance support: Bilateral upper extremity supported Standing balance-Leahy Scale: Zero Standing balance comment: Max A x2 to maintain standing balance with HHA on L side and RLE blocking on R side to prevent buckling and support of RUE to prevent injury to shoulder    Special considerations/life events  NA   Previous Home Environment (from acute therapy documentation) Living Arrangements: Spouse/significant other  Lives With: Spouse Available Help at Discharge: Family, Available 24 hours/day Type of Home: House Home Layout: One level Home Access: Stairs to enter Entrance Stairs-Rails: Right, Left Entrance Stairs-Number of Steps: 4 on front and 8 on the back Bathroom Shower/Tub: Engineer, manufacturing systems: Handicapped height Bathroom Accessibility: Yes How Accessible:  Accessible via walker Home Care Services: No Additional Comments: utilized a walking stick to hike, walking and going to the gym 3 days/wk  Discharge Living Setting Plans for Discharge Living Setting: Patient's home Type of Home at Discharge: House Discharge Home Layout: One level Discharge Home Access: Stairs to enter Entrance Stairs-Rails: Right, Left Entrance Stairs-Number of Steps: 4in front, 8 in back Discharge Bathroom Shower/Tub: Tub/shower unit Discharge Bathroom Toilet: Handicapped height Discharge Bathroom Accessibility: Yes How Accessible: Accessible via walker Does the patient have any problems obtaining your medications?: No  Social/Family/Support Systems    Goals Patient/Family Goal for Rehab: *** Expected length of stay: ***  Decrease burden of Care through IP rehab admission: NA  Possible need for SNF placement upon discharge: Not anticipated  Patient Condition: I have reviewed medical records from [facility], spoken with CSW, and {CHL IP Patient Spouse Son Daughter Family Member:[id number]}. I discussed via phone for inpatient rehabilitation assessment.  Patient will benefit from ongoing {CHL IP PT OT DOE:[id number]}, can actively participate in 3 hours of therapy a day 5 days of the week, and can make measurable gains during the admission.  Patient will also benefit from the coordinated team approach during an Inpatient Acute Rehabilitation admission.  The patient will receive intensive therapy as well as Rehabilitation physician, nursing, social worker, and care management interventions.  Due to safety, disease management, medication administration, pain management, and patient education the patient requires 24 hour a day rehabilitation nursing.  The patient is currently *** with mobility and basic ADLs.  Discharge setting and therapy post discharge at home with home health is anticipated.  Patient has agreed to participate in the Acute Inpatient  Rehabilitation Program and will admit {Time; today/tomorrow:10263}.  Preadmission Screen Completed By:  Tinnie SHAUNNA Yvone Delayne, 08/19/2024 11:18 AM ______________________________________________________________________   Discussed status with Dr. PIERRETTE on *** at *** and received approval for admission today.  Admission Coordinator:  Tinnie SHAUNNA Yvone Delayne, CCC-SLP, time ***/Date ***   Assessment/Plan: Diagnosis: *** Does the need for close, 24 hr/day Medical supervision in concert with the patient's rehab needs make it unreasonable for this patient to be served in a less intensive setting? {yes_no_potentially:3041433} Co-Morbidities requiring supervision/potential complications: *** Due to {due un:6958565}, does the patient require 24 hr/day rehab nursing? {yes_no_potentially:3041433} Does the patient require coordinated care of a physician, rehab nurse, PT, OT, and SLP to address physical and functional deficits in the context of the above medical diagnosis(es)? {yes_no_potentially:3041433} Addressing deficits in the following areas: {deficits:3041436} Can the patient actively participate in an intensive  therapy program of at least 3 hrs of therapy 5 days a week? {yes_no_potentially:3041433} The potential for patient to make measurable gains while on inpatient rehab is {potential:3041437} Anticipated functional outcomes upon discharge from inpatient rehab: {functional outcomes:304600100} PT, {functional outcomes:304600100} OT, {functional outcomes:304600100} SLP Estimated rehab length of stay to reach the above functional goals is: *** Anticipated discharge destination: {anticipated dc setting:21604} 10. Overall Rehab/Functional Prognosis: {potential:3041437}   MD Signature: ***

## 2024-08-20 DIAGNOSIS — Z7982 Long term (current) use of aspirin: Secondary | ICD-10-CM | POA: Diagnosis not present

## 2024-08-20 DIAGNOSIS — I639 Cerebral infarction, unspecified: Secondary | ICD-10-CM | POA: Diagnosis not present

## 2024-08-20 DIAGNOSIS — Z7901 Long term (current) use of anticoagulants: Secondary | ICD-10-CM | POA: Diagnosis not present

## 2024-08-20 DIAGNOSIS — I6340 Cerebral infarction due to embolism of unspecified cerebral artery: Secondary | ICD-10-CM | POA: Diagnosis not present

## 2024-08-20 DIAGNOSIS — I480 Paroxysmal atrial fibrillation: Secondary | ICD-10-CM | POA: Diagnosis not present

## 2024-08-20 LAB — BASIC METABOLIC PANEL WITH GFR
Anion gap: 9 (ref 5–15)
BUN: 23 mg/dL (ref 8–23)
CO2: 22 mmol/L (ref 22–32)
Calcium: 9.6 mg/dL (ref 8.9–10.3)
Chloride: 108 mmol/L (ref 98–111)
Creatinine, Ser: 1.16 mg/dL (ref 0.61–1.24)
GFR, Estimated: >60 mL/min (ref >60)
Glucose, Bld: 113 mg/dL — ABNORMAL HIGH (ref 70–99)
Potassium: 3.7 mmol/L (ref 3.5–5.1)
Sodium: 139 mmol/L (ref 135–145)

## 2024-08-20 LAB — CBC
HCT: 41.5 % (ref 39.0–52.0)
Hemoglobin: 14.8 g/dL (ref 13.0–17.0)
MCH: 34.4 pg — ABNORMAL HIGH (ref 26.0–34.0)
MCHC: 35.7 g/dL (ref 30.0–36.0)
MCV: 96.5 fL (ref 80.0–100.0)
Platelets: 204 K/uL (ref 150–400)
RBC: 4.3 MIL/uL (ref 4.22–5.81)
RDW: 12.7 % (ref 11.5–15.5)
WBC: 8.1 K/uL (ref 4.0–10.5)
nRBC: 0 % (ref 0.0–0.2)

## 2024-08-20 MED ORDER — DABIGATRAN ETEXILATE MESYLATE 150 MG PO CAPS
150.0000 mg | ORAL_CAPSULE | Freq: Two times a day (BID) | ORAL | Status: DC
  Administered 2024-08-20 – 2024-08-26 (×13): 150 mg via ORAL
  Filled 2024-08-20 (×13): qty 1

## 2024-08-20 NOTE — Plan of Care (Signed)
  Problem: Education: Goal: Knowledge of disease or condition will improve Outcome: Progressing Goal: Knowledge of secondary prevention will improve (MUST DOCUMENT ALL) Outcome: Progressing Goal: Knowledge of patient specific risk factors will improve (DELETE if not current risk factor) Outcome: Progressing   Problem: Ischemic Stroke/TIA Tissue Perfusion: Goal: Complications of ischemic stroke/TIA will be minimized Outcome: Progressing   Problem: Coping: Goal: Will verbalize positive feelings about self Outcome: Progressing Goal: Will identify appropriate support needs Outcome: Progressing   Problem: Health Behavior/Discharge Planning: Goal: Ability to manage health-related needs will improve Outcome: Progressing Goal: Goals will be collaboratively established with patient/family Outcome: Progressing   Problem: Self-Care: Goal: Ability to participate in self-care as condition permits will improve Outcome: Progressing Goal: Verbalization of feelings and concerns over difficulty with self-care will improve Outcome: Progressing Goal: Ability to communicate needs accurately will improve Outcome: Progressing   Problem: Nutrition: Goal: Risk of aspiration will decrease Outcome: Progressing Goal: Dietary intake will improve Outcome: Progressing   Problem: Education: Goal: Knowledge of General Education information will improve Description: Including pain rating scale, medication(s)/side effects and non-pharmacologic comfort measures Outcome: Progressing   Problem: Health Behavior/Discharge Planning: Goal: Ability to manage health-related needs will improve Outcome: Progressing   Problem: Clinical Measurements: Goal: Ability to maintain clinical measurements within normal limits will improve Outcome: Progressing Goal: Will remain free from infection Outcome: Progressing Goal: Diagnostic test results will improve Outcome: Progressing Goal: Respiratory complications will  improve Outcome: Progressing Goal: Cardiovascular complication will be avoided Outcome: Progressing   Problem: Activity: Goal: Risk for activity intolerance will decrease Outcome: Progressing   Problem: Nutrition: Goal: Adequate nutrition will be maintained Outcome: Progressing   Problem: Coping: Goal: Level of anxiety will decrease Outcome: Progressing   Problem: Elimination: Goal: Will not experience complications related to bowel motility Outcome: Progressing Goal: Will not experience complications related to urinary retention Outcome: Progressing   Problem: Pain Managment: Goal: General experience of comfort will improve and/or be controlled Outcome: Progressing   Problem: Safety: Goal: Ability to remain free from injury will improve Outcome: Progressing   Problem: Skin Integrity: Goal: Risk for impaired skin integrity will decrease Outcome: Progressing   Problem: Education: Goal: Knowledge of General Education information will improve Description: Including pain rating scale, medication(s)/side effects and non-pharmacologic comfort measures Outcome: Progressing

## 2024-08-20 NOTE — Progress Notes (Addendum)
 NEUROLOGY CONSULT FOLLOW UP NOTE   Date of service: August 20, 2024 Patient Name: Terry [name] MRN:  [id number] DOB:  25-Feb-1951  Interval Hx/subjective  Seen and examined. Doing well - no changes other than some improvement in RLE strength subjectively  Vitals   Vitals:   08/19/24 1556 08/19/24 1957 08/19/24 2357 08/20/24 0357  BP: 128/72 130/86 (!) 146/98 128/62  Pulse: 80 85 90 95  Resp: 18 15    Temp: 98 F (36.7 C) 98.3 F (36.8 C) 98.1 F (36.7 C) 98.2 F (36.8 C)  TempSrc:    Oral  SpO2: 100% 97% 95% 97%  Weight:      Height:         Body mass index is 28.32 kg/m.  Physical Exam   General: Awake alert in no distress Neurological exam Awake alert oriented x 3 Speech is mildly dysarthric No evidence of aphasia Cranial nerves: Pupils are equal round react light, extract movements are normal, visual fields are full, face is grossly symmetric at rest and on smiling with near complete lower facial weakness on the right including weakness of the right eyelid.  Forehead wrinkling is preserved on both sides.  Tongue and palate midline. Motor examination with 0/5 right upper extremity with may be 1/5 on a lot of effort but that is only approximately moving the shoulder.  Lower extremity on the right today is maybe a 1/5 at the ankle plantar flexion, 0/5 all other muscles.  Left side is full strength Sensation is intact without extinction Coordination on the left is intact.  Unable to test on the left.  Medications  Current Facility-Administered Medications:    acetaminophen  (TYLENOL ) tablet 650 mg, 650 mg, Oral, Q4H PRN **OR** acetaminophen  (TYLENOL ) 160 MG/5ML solution 650 mg, 650 mg, Per Tube, Q4H PRN **OR** acetaminophen  (TYLENOL ) suppository 650 mg, 650 mg, Rectal, Q4H PRN, Laurita Manor T, MD   aspirin  EC tablet 81 mg, 81 mg, Oral, Daily, Laurita, Ping T, MD, 81 mg at 08/19/24 9070   atorvastatin  (LIPITOR) tablet 80 mg, 80 mg, Oral, QHS, Laurita Manor T, MD, 80 mg at  08/19/24 2113   enoxaparin  (LOVENOX ) injection 40 mg, 40 mg, Subcutaneous, Q24H, Laurita Manor T, MD, 40 mg at 08/19/24 2112   melatonin tablet 5 mg, 5 mg, Oral, QHS, Zhang, Ping T, MD, 5 mg at 08/19/24 2113   pantoprazole  (PROTONIX ) EC tablet 40 mg, 40 mg, Oral, Daily, Laurita Manor T, MD, 40 mg at 08/19/24 9070   senna-docusate (Senokot-S) tablet 1 tablet, 1 tablet, Oral, QHS PRN, Laurita Manor DASEN, MD  Labs and Diagnostic Imaging   CBC:  Recent Labs  Lab 08/18/24 0321 08/20/24 0603  WBC 7.8 8.1  NEUTROABS 5.9  --   HGB 13.7 14.8  HCT 39.3 41.5  MCV 99.2 96.5  PLT 176 204    Basic Metabolic Panel:  Lab Results  Component Value Date   NA 139 08/20/2024   K 3.7 08/20/2024   CO2 22 08/20/2024   GLUCOSE 113 (H) 08/20/2024   BUN 23 08/20/2024   CREATININE 1.16 08/20/2024   CALCIUM  9.6 08/20/2024   GFRNONAA >60 08/20/2024   GFRAA 59 (L) 05/10/2020   Lipid Panel:  Lab Results  Component Value Date   LDLCALC 71 08/19/2024   HgbA1c:  Lab Results  Component Value Date   HGBA1C 5.3 08/18/2024   Urine Drug Screen:     Component Value Date/Time   LABOPIA NONE DETECTED 08/18/2024 0651   COCAINSCRNUR NONE DETECTED  08/18/2024 0651   LABBENZ NONE DETECTED 08/18/2024 0651   AMPHETMU NONE DETECTED 08/18/2024 0651   THCU NONE DETECTED 08/18/2024 0651   LABBARB NONE DETECTED 08/18/2024 0651    Alcohol Level     Component Value Date/Time   [facility] <15 08/18/2024 0341   INR  Lab Results  Component Value Date   INR 1.3 (H) 08/18/2024   APTT  Lab Results  Component Value Date   APTT 34 08/18/2024   CT Head without contrast(Personally reviewed): No acute findings   CT angio Head and Neck with contrast(Personally reviewed): No intracranial LVO or proximal stenosis.  Acute infarct in the left external capsule and body of the caudate visible as subtle low-density.  No evidence of hemorrhagic transformation.  Atherosclerotic calcification of carotid bifurcations and ICA bulbs but no  stenosis.  Old infarction in the left frontal operculum.   MRI Brain(Personally reviewed): Acute lacunar infarct tracking from the posterior left corona radiata into the left lentiform.  No associated mass or hemorrhage.  Underlying chronic small and medium sized vessel ischemia including previous left MCA territory operculum infarct.  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  Repeat head CT done 8/22 for concern for neuro worsening-reviewed yesterday-possible mild extension of the left deep white and deep gray matter lacunar infarct since the DWI earlier in the morning.  No hemorrhagic transformation.  Assessment   TYRIAN PEART is a 73 y.o. 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 MRI brain showed lacunar infarction in the left frontal radiata/lentiform nuclei with mild expansion on follow-up CT. The look of the stroke on MRI is more suggestive of small vessel etiology but given his risk factors, embolic source is always in the differentials Since he has had multiple strokes in Eliquis , discussed with the cardiologist to switch to Pradaxa -cardiology okay with consideration of switching to Pradaxa .  Impression: Acute ischemic stroke-likely embolic although small vessel etiology is also possible  Recommendations  Frequent neurochecks Telemetry A1c at goal LDL goal is 70.  Patient's LDL is 71.  He is on Lipitor 80 at home.  Continue Lipitor. He is on  aspirin  and Eliquis .  Discussed with the on-call cardiologist if switching to Pradaxa  would be appropriate or if they have any concerns.  No concerns from cardiology for switching. Start Pradaxa  today. If there are any issues with accessibility or affordability of Pradaxa , can continue Eliquis  back. Pharmacy techs are back Monday and can run the price with insurance check then. Therapy assessments.  Family and patient motivated for good recovery and looking for CIR.  Processes in place. Medical management per primary team Outpatient follow up with neurology 8-12 weeks  Plan discussed with the patient and family at bedside and with Dr. Trudy Inpatient neurology will be available as needed. ______________________________________________________________________   Bonney Eligio Lav, MD [facility]

## 2024-08-20 NOTE — Progress Notes (Signed)
 PROGRESS NOTE    Terry [name]  FMW:[id number] DOB: 1951-08-11 DOA: 08/18/2024 PCP: [name], MD  Assessment & Plan:   Active Problems:   Acute CVA (cerebrovascular accident) [facility])   CVA (cerebral vascular accident) (HCC)  Assessment and Plan: CVA: w/ right sided paresis. Continue on aspirin  & started on pradaxa . Hx of previous CVA w/ residual aphasia. PT/OT recs CIR. Pt agrees to CIR   AKI: w/ mild left hydronephrosis as per renal US . Cr is labile.   PAF: started on pradaxa  as per neuro    Hx of mitral prolapse: s/p bovine mitral valve replacement. Echo shows EF 40-45%, LV global hypokinesis, grade I diastolic dysfunction, mild MR, mod-severe MS, mod AS. Will need to f/u outpatient w/ cardio      DVT prophylaxis: lovenox   Code Status: full  Family Communication: Disposition Plan: possibly d/c to inpatient rehab  Level of care: Med-Surg  Status is: Inpatient Remains inpatient appropriate because: severity of illness      Consultants:  Neuro   Procedures:   Antimicrobials:    Subjective: Pt c/o difficulty moving right arm & right leg still   Objective: Vitals:   08/19/24 1957 08/19/24 2357 08/20/24 0357 08/20/24 0757  BP: 130/86 (!) 146/98 128/62 122/77  Pulse: 85 90 95 91  Resp: 15   16  Temp: 98.3 F (36.8 C) 98.1 F (36.7 C) 98.2 F (36.8 C) 97.8 F (36.6 C)  TempSrc:   Oral Oral  SpO2: 97% 95% 97% 94%  Weight:      Height:        Intake/Output Summary (Last 24 hours) at 08/20/2024 0856 Last data filed at 08/20/2024 0149 Gross per 24 hour  Intake 120 ml  Output 1000 ml  Net -880 ml   Filed Weights   08/18/24 0321  Weight: 82 kg    Examination:  General exam: Appears comfortable  Respiratory system: clear breath sounds b/l Cardiovascular system: S1/S2+. No rubs or clicks  Gastrointestinal system: Abd is soft, NT,ND & hypoactive bowel sounds. Central nervous system: Alert & oriented. Unable to move right arm & right leg   Psychiatry: judgement and insight appears at baseline. Appropriate mood and affect    Data Reviewed: I have personally reviewed following labs and imaging studies  CBC: Recent Labs  Lab 08/18/24 0321 08/20/24 0603  WBC 7.8 8.1  NEUTROABS 5.9  --   HGB 13.7 14.8  HCT 39.3 41.5  MCV 99.2 96.5  PLT 176 204   Basic Metabolic Panel: Recent Labs  Lab 08/18/24 0321 08/18/24 0341 08/19/24 0614 08/20/24 0603  NA 142  --  139 139  K 4.4  --  4.5 3.7  CL 107  --  108 108  CO2 25  --  21* 22  GLUCOSE 122*  --  100* 113*  BUN 16  --  23 23  CREATININE 1.53*  --  1.23 1.16  CALCIUM  9.4  --  9.4 9.6  MG  --  1.9  --   --    GFR: Estimated Creatinine Clearance: 57.3 mL/min (by C-G formula based on SCr of 1.16 mg/dL). Liver Function Tests: Recent Labs  Lab 08/18/24 0321  AST 24  ALT 20  ALKPHOS 80  BILITOT 0.8  PROT 6.8  ALBUMIN 3.9   No results for input(s): LIPASE, AMYLASE in the last 168 hours. No results for input(s): AMMONIA in the last 168 hours. Coagulation Profile: Recent Labs  Lab 08/18/24 0321  INR 1.3*  Cardiac Enzymes: No results for input(s): CKTOTAL, CKMB, CKMBINDEX, TROPONINI in the last 168 hours. BNP (last 3 results) No results for input(s): PROBNP in the last 8760 hours. HbA1C: Recent Labs    08/18/24 0321  HGBA1C 5.3   CBG: No results for input(s): GLUCAP in the last 168 hours. Lipid Profile: Recent Labs    08/19/24 0614  CHOL 120  HDL 30*  LDLCALC 71  TRIG 96  CHOLHDL 4.0   Thyroid Function Tests: Recent Labs    08/18/24 0341  TSH 3.956  FREET4 0.91   Anemia Panel: No results for input(s): VITAMINB12, FOLATE, FERRITIN, TIBC, IRON, RETICCTPCT in the last 72 hours. Sepsis Labs: No results for input(s): PROCALCITON, LATICACIDVEN in the last 168 hours.  Recent Results (from the past 240 hours)  Group A Strep by PCR ([facility] Only)     Status: None   Collection Time: 08/18/24  3:41 AM    Specimen: Throat; Sterile Swab  Result Value Ref Range Status   Group A Strep by PCR NOT DETECTED NOT DETECTED Final    Comment: Performed at [facility], [address] [place], [place] [address]  Resp panel by RT-PCR (RSV, Flu A&B, Covid) Throat     Status: None   Collection Time: 08/18/24  3:41 AM   Specimen: Throat; Nasal Swab  Result Value Ref Range Status   SARS Coronavirus 2 by RT PCR NEGATIVE NEGATIVE Final    Comment: (NOTE) SARS-CoV-2 target nucleic acids are NOT DETECTED.  The SARS-CoV-2 RNA is generally detectable in upper respiratory specimens during the acute phase of infection. The lowest concentration of SARS-CoV-2 viral copies this assay can detect is 138 copies/mL. A negative result does not preclude SARS-Cov-2 infection and should not be used as the sole basis for treatment or other patient management decisions. A negative result may occur with  improper specimen collection/handling, submission of specimen other than nasopharyngeal swab, presence of viral mutation(s) within the areas targeted by this assay, and inadequate number of viral copies(<138 copies/mL). A negative result must be combined with clinical observations, patient history, and epidemiological information. The expected result is Negative.  Fact Sheet for Patients:  BloggerCourse.com  Fact Sheet for Healthcare Providers:  SeriousBroker.it  This test is no t yet approved or cleared by the [place]  FDA and  has been authorized for detection and/or diagnosis of SARS-CoV-2 by FDA under an Emergency Use Authorization (EUA). This EUA will remain  in effect (meaning this test can be used) for the duration of the COVID-19 declaration under Section 564(b)(1) of the Act, 21 U.S.C.section 360bbb-3(b)(1), unless the authorization is terminated  or revoked sooner.       Influenza A by PCR NEGATIVE NEGATIVE Final   Influenza B by PCR  NEGATIVE NEGATIVE Final    Comment: (NOTE) The Xpert Xpress SARS-CoV-2/FLU/RSV plus assay is intended as an aid in the diagnosis of influenza from Nasopharyngeal swab specimens and should not be used as a sole basis for treatment. Nasal washings and aspirates are unacceptable for Xpert Xpress SARS-CoV-2/FLU/RSV testing.  Fact Sheet for Patients: BloggerCourse.com  Fact Sheet for Healthcare Providers: SeriousBroker.it  This test is not yet approved or cleared by the [place]  FDA and has been authorized for detection and/or diagnosis of SARS-CoV-2 by FDA under an Emergency Use Authorization (EUA). This EUA will remain in effect (meaning this test can be used) for the duration of the COVID-19 declaration under Section 564(b)(1) of the Act, 21 U.S.C. section 360bbb-3(b)(1), unless the authorization  is terminated or revoked.     Resp Syncytial Virus by PCR NEGATIVE NEGATIVE Final    Comment: (NOTE) Fact Sheet for Patients: BloggerCourse.com  Fact Sheet for Healthcare Providers: SeriousBroker.it  This test is not yet approved or cleared by the [place]  FDA and has been authorized for detection and/or diagnosis of SARS-CoV-2 by FDA under an Emergency Use Authorization (EUA). This EUA will remain in effect (meaning this test can be used) for the duration of the COVID-19 declaration under Section 564(b)(1) of the Act, 21 U.S.C. section 360bbb-3(b)(1), unless the authorization is terminated or revoked.  Performed at [facility], [address] [place], [place] [address]          Radiology Studies: ECHOCARDIOGRAM COMPLETE Result Date: 08/18/2024    ECHOCARDIOGRAM REPORT   Patient Name:   Terry [name] Date of Exam: 08/18/2024 Medical Rec #:  [id number]    Height:       67.0 in Accession #:    [id number]   Weight:       180.8 lb Date of Birth:  27-May-1951   BSA:           1.937 m Patient Age:    72 years     BP:           120/70 mmHg Patient Gender: M            HR:           87 bpm. Exam Location:  [facility] Procedure: 2D Echo, Cardiac Doppler, Color Doppler and Intracardiac            Opacification Agent (Both Spectral and Color Flow Doppler were            utilized during procedure). Indications:     Stroke I63.9  History:         Patient has prior history of Echocardiogram examinations, most                  recent 04/02/2021. Stroke; Mitral Valve Disease.  Sonographer:     Rosina Dunk Referring Phys:  [id number] CORT ONEIDA MANA Diagnosing Phys: Cara JONETTA Lovelace MD  Sonographer Comments: Technically difficult study due to poor echo windows. IMPRESSIONS  1. TDS.  2. Left ventricular ejection fraction, by estimation, is 40 to 45%. The left ventricle has mildly decreased function. The left ventricle demonstrates global hypokinesis. The left ventricular internal cavity size was mildly dilated. There is moderate asymmetric left ventricular hypertrophy of the inferior segment. Left ventricular diastolic parameters are consistent with Grade I diastolic dysfunction (impaired relaxation).  3. Right ventricular systolic function is normal. The right ventricular size is normal.  4. The mitral valve has been repaired/replaced. Mild mitral valve regurgitation. Moderate to severe mitral stenosis.  5. The aortic valve is calcified. Aortic valve regurgitation is trivial. Moderate aortic valve stenosis. Conclusion(s)/Recommendation(s): Poor windows for evaluation of left ventricular function by transthoracic echocardiography. Would recommend an alternative means of evaluation. FINDINGS  Left Ventricle: Left ventricular ejection fraction, by estimation, is 40 to 45%. The left ventricle has mildly decreased function. The left ventricle demonstrates global hypokinesis. Definity  contrast agent was given IV to delineate the left ventricular  endocardial borders. Strain was performed and the  global longitudinal strain is indeterminate. The left ventricular internal cavity size was mildly dilated. There is moderate asymmetric left ventricular hypertrophy of the inferior segment. Left ventricular diastolic parameters are consistent with Grade I diastolic dysfunction (impaired relaxation). Right Ventricle: The right ventricular size is  normal. No increase in right ventricular wall thickness. Right ventricular systolic function is normal. Left Atrium: Left atrial size was normal in size. Right Atrium: Right atrial size was normal in size. Pericardium: There is no evidence of pericardial effusion. Mitral Valve: The mitral valve has been repaired/replaced. Mild mitral valve regurgitation. There is a bioprosthetic valve present in the mitral position. Moderate to severe mitral valve stenosis. MV peak gradient, 21.7 mmHg. The mean mitral valve gradient is 12.0 mmHg. Tricuspid Valve: The tricuspid valve is normal in structure. Tricuspid valve regurgitation is trivial. Aortic Valve: The aortic valve is calcified. Aortic valve regurgitation is trivial. Moderate aortic stenosis is present. Aortic valve mean gradient measures 12.0 mmHg. Aortic valve peak gradient measures 22.3 mmHg. Aortic valve area, by VTI measures 1.02  cm. Pulmonic Valve: The pulmonic valve was grossly normal. Pulmonic valve regurgitation is trivial. Aorta: The ascending aorta was not well visualized. IAS/Shunts: No atrial level shunt detected by color flow Doppler. Additional Comments: TDS. 3D was performed not requiring image post processing on an independent workstation and was indeterminate.  LEFT VENTRICLE PLAX 2D LVIDd:         5.30 cm LVIDs:         4.80 cm LV PW:         1.40 cm LV IVS:        1.00 cm LVOT diam:     2.30 cm LV SV:         38 LV SV Index:   20 LVOT Area:     4.15 cm  RIGHT VENTRICLE RV S prime:     5.11 cm/s TAPSE (M-mode): 0.8 cm AORTIC VALVE                     PULMONIC VALVE AV Area (Vmax):    0.99 cm      PV Vmax:         1.12 m/s AV Area (Vmean):   0.92 cm      PV Vmean:       78.300 cm/s AV Area (VTI):     1.02 cm      PV VTI:         0.196 m AV Vmax:           236.33 cm/s   PV Peak grad:   5.0 mmHg AV Vmean:          160.000 cm/s  PV Mean grad:   3.0 mmHg AV VTI:            0.376 m       RVOT Peak grad: 4 mmHg AV Peak Grad:      22.3 mmHg AV Mean Grad:      12.0 mmHg LVOT Vmax:         56.10 cm/s LVOT Vmean:        35.600 cm/s LVOT VTI:          0.092 m LVOT/AV VTI ratio: 0.25  AORTA Ao Root diam: 3.80 cm Ao Asc diam:  3.00 cm MITRAL VALVE                TRICUSPID VALVE MV Area (PHT): 4.60 cm     TR Peak grad:   21.9 mmHg MV Area VTI:   0.93 cm     TR Mean grad:   13.0 mmHg MV Peak grad:  21.7 mmHg    TR Vmax:        234.00 cm/s MV Mean grad:  12.0 mmHg    TR Vmean:       169.0 cm/s MV Vmax:       2.33 m/s MV Vmean:      172.0 cm/s   SHUNTS MV Decel Time: 165 msec     Systemic VTI:  0.09 m MV E velocity: 133.00 cm/s  Systemic Diam: 2.30 cm MV A velocity: 197.00 cm/s  Pulmonic VTI:  0.167 m MV E/A ratio:  0.68 Dwayne JONETTA Lovelace MD Electronically signed by Cara JONETTA Lovelace MD Signature Date/Time: 08/18/2024/3:06:23 PM    Final    CT Head Wo Contrast Result Date: 08/18/2024 CLINICAL DATA:  73 year old male with fall and neurologic deficits. Left posterior corona radiata/basal ganglia lacunar infarct on MRI this morning. Worsening neurologic symptoms. EXAM: CT HEAD WITHOUT CONTRAST TECHNIQUE: Contiguous axial images were obtained from the base of the skull through the vertex without intravenous contrast. RADIATION DOSE REDUCTION: This exam was performed according to the departmental dose-optimization program which includes automated exposure control, adjustment of the mA and/or kV according to patient size and/or use of iterative reconstruction technique. COMPARISON:  Brain MRI 08/18/2024, head CT 0754 hours today. FINDINGS: Brain: Hypodensity posterior left corona radiata tracking to the lentiform which has progressed  since 0343 hours today on CT (series 2, image 13), and probably is slightly larger since than the DWI abnormality at 0541 hours today (compare to series 5, image 27 at that time). No hemorrhagic transformation. No mass effect. Stable gray-white differentiation elsewhere including chronic left thalamic lacunar infarct, chronic left operculum infarct. No intracranial mass effect or ventriculomegaly. Vascular: Residual intravascular contrast from CTA this morning. Skull: Stable and intact. Sinuses/Orbits: Visualized paranasal sinuses and mastoids are stable and well aerated. Other: No acute orbit or scalp soft tissue finding. IMPRESSION: 1. Evidence of mild extension of the left deep white and deep gray matter lacunar infarct since DWI MRI 0544 hours today. No hemorrhagic transformation or mass effect. 2. No new intracranial abnormality identified. Background chronic ischemic disease. Electronically Signed   By: VEAR Hurst M.D.   On: 08/18/2024 12:46   US  RENAL Result Date: 08/18/2024 CLINICAL DATA:  Acute renal injury EXAM: RENAL / URINARY TRACT ULTRASOUND COMPLETE COMPARISON:  None Available. FINDINGS: Right Kidney: Renal measurements: 9.9 x 4.2 x 5.1 = volume: 110 mL. Echogenicity within normal limits. No mass or hydronephrosis visualized. Left Kidney: Renal measurements: 10.2 x 5.7 x 5.4 = volume: 167 mL. Mild LEFT hydronephrosis. 2 cm anechoic cyst in lower pole. Bladder: Appears normal for degree of bladder distention. Bilateral ureteral jets identified. Other: None. IMPRESSION: 1. Mild LEFT hydronephrosis. 2. Benign LEFT renal cyst. 3. Normal RIGHT kidney. 4. Bilateral ureteral jets identified. Electronically Signed   By: Jackquline Boxer M.D.   On: 08/18/2024 11:16        Scheduled Meds:  aspirin  EC  81 mg Oral Daily   atorvastatin   80 mg Oral QHS   dabigatran   150 mg Oral Q12H   melatonin  5 mg Oral QHS   pantoprazole   40 mg Oral Daily   Continuous Infusions:   LOS: 1 day     Anthony CHRISTELLA Pouch, MD [facility] Pager [phone]  If 7PM-7AM, please contact night-coverage [email or web address] 08/20/2024, 8:56 AM

## 2024-08-20 NOTE — Progress Notes (Signed)
 Inpatient Rehab Admissions Coordinator:  Spoke with pt on the telephone. Explained CIR goals and expectations in detail. Discussed average length of say, insurance authorization and discharge home after completion of CIR . Pt acknowledged understanding. Pt is interested in pursuing CIR. Will continue to follow.   Tinnie Yvone Cohens, MS, CCC-SLP Admissions Coordinator [phone]

## 2024-08-20 NOTE — Plan of Care (Signed)
 Problem: Education: Goal: Knowledge of disease or condition will improve 08/20/2024 1856 by Mavis Ellouise SAUNDERS, LPN Outcome: Progressing 08/20/2024 1854 by Mavis Ellouise SAUNDERS, LPN Outcome: Progressing Goal: Knowledge of secondary prevention will improve (MUST DOCUMENT ALL) 08/20/2024 1856 by Mavis Ellouise SAUNDERS, LPN Outcome: Progressing 08/20/2024 1854 by Mavis Ellouise SAUNDERS, LPN Outcome: Progressing Goal: Knowledge of patient specific risk factors will improve (DELETE if not current risk factor) 08/20/2024 1856 by Mavis Ellouise SAUNDERS, LPN Outcome: Progressing 08/20/2024 1854 by Mavis Ellouise SAUNDERS, LPN Outcome: Progressing   Problem: Ischemic Stroke/TIA Tissue Perfusion: Goal: Complications of ischemic stroke/TIA will be minimized 08/20/2024 1856 by Mavis Ellouise SAUNDERS, LPN Outcome: Progressing 08/20/2024 1854 by Mavis Ellouise SAUNDERS, LPN Outcome: Progressing   Problem: Coping: Goal: Will verbalize positive feelings about self 08/20/2024 1856 by Mavis Ellouise SAUNDERS, LPN Outcome: Progressing 08/20/2024 1854 by Mavis Ellouise SAUNDERS, LPN Outcome: Progressing Goal: Will identify appropriate support needs 08/20/2024 1856 by Mavis Ellouise SAUNDERS, LPN Outcome: Progressing 08/20/2024 1854 by Mavis Ellouise SAUNDERS, LPN Outcome: Progressing   Problem: Health Behavior/Discharge Planning: Goal: Ability to manage health-related needs will improve 08/20/2024 1856 by Mavis Ellouise SAUNDERS, LPN Outcome: Progressing 08/20/2024 1854 by Mavis Ellouise SAUNDERS, LPN Outcome: Progressing Goal: Goals will be collaboratively established with patient/family 08/20/2024 1856 by Mavis Ellouise SAUNDERS, LPN Outcome: Progressing 08/20/2024 1854 by Mavis Ellouise SAUNDERS, LPN Outcome: Progressing   Problem: Self-Care: Goal: Ability to participate in self-care as condition permits will improve 08/20/2024 1856 by Mavis Ellouise SAUNDERS, LPN Outcome: Progressing 08/20/2024 1854 by Mavis Ellouise SAUNDERS, LPN Outcome: Progressing Goal: Verbalization of feelings and concerns over difficulty with  self-care will improve 08/20/2024 1856 by Mavis Ellouise SAUNDERS, LPN Outcome: Progressing 08/20/2024 1854 by Mavis Ellouise SAUNDERS, LPN Outcome: Progressing Goal: Ability to communicate needs accurately will improve 08/20/2024 1856 by Mavis Ellouise SAUNDERS, LPN Outcome: Progressing 08/20/2024 1854 by Mavis Ellouise SAUNDERS, LPN Outcome: Progressing   Problem: Nutrition: Goal: Risk of aspiration will decrease 08/20/2024 1856 by Mavis Ellouise SAUNDERS, LPN Outcome: Progressing 08/20/2024 1854 by Mavis Ellouise SAUNDERS, LPN Outcome: Progressing Goal: Dietary intake will improve 08/20/2024 1856 by Mavis Ellouise SAUNDERS, LPN Outcome: Progressing 08/20/2024 1854 by Mavis Ellouise SAUNDERS, LPN Outcome: Progressing   Problem: Education: Goal: Knowledge of General Education information will improve Description: Including pain rating scale, medication(s)/side effects and non-pharmacologic comfort measures 08/20/2024 1856 by Mavis Ellouise SAUNDERS, LPN Outcome: Progressing 08/20/2024 1854 by Mavis Ellouise SAUNDERS, LPN Outcome: Progressing   Problem: Health Behavior/Discharge Planning: Goal: Ability to manage health-related needs will improve 08/20/2024 1856 by Mavis Ellouise SAUNDERS, LPN Outcome: Progressing 08/20/2024 1854 by Mavis Ellouise SAUNDERS, LPN Outcome: Progressing   Problem: Clinical Measurements: Goal: Ability to maintain clinical measurements within normal limits will improve 08/20/2024 1856 by Mavis Ellouise SAUNDERS, LPN Outcome: Progressing 08/20/2024 1854 by Mavis Ellouise SAUNDERS, LPN Outcome: Progressing Goal: Will remain free from infection 08/20/2024 1856 by Mavis Ellouise SAUNDERS, LPN Outcome: Progressing 08/20/2024 1854 by Mavis Ellouise SAUNDERS, LPN Outcome: Progressing Goal: Diagnostic test results will improve 08/20/2024 1856 by Mavis Ellouise SAUNDERS, LPN Outcome: Progressing 08/20/2024 1854 by Mavis Ellouise SAUNDERS, LPN Outcome: Progressing Goal: Respiratory complications will improve 08/20/2024 1856 by Mavis Ellouise SAUNDERS, LPN Outcome: Progressing 08/20/2024 1854 by Mavis Ellouise SAUNDERS,  LPN Outcome: Progressing Goal: Cardiovascular complication will be avoided 08/20/2024 1856 by Mavis Ellouise SAUNDERS, LPN Outcome: Progressing 08/20/2024 1854 by Mavis Ellouise SAUNDERS, LPN Outcome: Progressing   Problem: Activity: Goal: Risk for activity intolerance will decrease 08/20/2024 1856 by Mavis Ellouise SAUNDERS, LPN Outcome: Progressing 08/20/2024 1854 by  Mavis Ellouise SAUNDERS, LPN Outcome: Progressing   Problem: Nutrition: Goal: Adequate nutrition will be maintained 08/20/2024 1856 by Mavis Ellouise SAUNDERS, LPN Outcome: Progressing 08/20/2024 1854 by Mavis Ellouise SAUNDERS, LPN Outcome: Progressing   Problem: Coping: Goal: Level of anxiety will decrease 08/20/2024 1856 by Mavis Ellouise SAUNDERS, LPN Outcome: Progressing 08/20/2024 1854 by Mavis Ellouise SAUNDERS, LPN Outcome: Progressing   Problem: Elimination: Goal: Will not experience complications related to bowel motility 08/20/2024 1856 by Mavis Ellouise SAUNDERS, LPN Outcome: Progressing 08/20/2024 1854 by Mavis Ellouise SAUNDERS, LPN Outcome: Progressing Goal: Will not experience complications related to urinary retention 08/20/2024 1856 by Mavis Ellouise SAUNDERS, LPN Outcome: Progressing 08/20/2024 1854 by Mavis Ellouise SAUNDERS, LPN Outcome: Progressing   Problem: Pain Managment: Goal: General experience of comfort will improve and/or be controlled 08/20/2024 1856 by Mavis Ellouise SAUNDERS, LPN Outcome: Progressing 08/20/2024 1854 by Mavis Ellouise SAUNDERS, LPN Outcome: Progressing   Problem: Safety: Goal: Ability to remain free from injury will improve 08/20/2024 1856 by Mavis Ellouise SAUNDERS, LPN Outcome: Progressing 08/20/2024 1854 by Mavis Ellouise SAUNDERS, LPN Outcome: Progressing   Problem: Skin Integrity: Goal: Risk for impaired skin integrity will decrease 08/20/2024 1856 by Mavis Ellouise SAUNDERS, LPN Outcome: Progressing 08/20/2024 1854 by Mavis Ellouise SAUNDERS, LPN Outcome: Progressing   Problem: Education: Goal: Knowledge of General Education information will improve Description: Including pain rating scale,  medication(s)/side effects and non-pharmacologic comfort measures 08/20/2024 1856 by Mavis Ellouise SAUNDERS, LPN Outcome: Progressing 08/20/2024 1854 by Mavis Ellouise SAUNDERS, LPN Outcome: Progressing

## 2024-08-20 NOTE — Progress Notes (Signed)
 Physical Therapy Treatment Patient Details Name: Terry [name] MRN: [id number] DOB: 28-Feb-1951 Today's Date: 08/20/2024   History of Present Illness Pt is a 73 y.o. male presented with new onset of right-sided weakness and fall. MRI this AM confirmed lacunar infarct and stat CT completed around 1230pm with evidence of mild extension of the left deep white and deep gray matter lacunar infarct. Cleared by neuro for participation with evaluation. 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  Gradually tolerates weight shift, dynamic reaching to midline and to R of midline, but does require intermittent min assist for balance correction with activities. Able to progress towards OOB to chair, completing lateral/scoot pivot transfer (towards R) with mod/max assist +2 (second person for safety).   Excellent efforts with transfer and all functional activities throughout session; patient encouraged by progress and hopeful for continued improvement.  Of note, gradual improvements in R LE strength noted today: R hip adduct and ext 2-/5, R knee ext 1/5, R ankle PF 1/5.     If plan is discharge home, recommend the following: Two people to help with walking and/or transfers;Two people to help with bathing/dressing/bathroom   Can travel by private vehicle        Equipment Recommendations       Recommendations for Other Services       Precautions / Restrictions Precautions Precautions: Fall Restrictions Weight Bearing Restrictions Per Provider Order: No     Mobility  Bed Mobility Overal bed mobility: Needs Assistance Bed Mobility: Supine to Sit     Supine to sit: Mod assist, Max assist     General bed mobility  comments: transition towards L side of bed; extensive assist for movement/position of R hemi-body    Transfers Overall transfer level: Needs assistance   Transfers: Bed to chair/wheelchair/BSC Sit to Stand: +2 safety/equipment, Max assist, Mod assist           General transfer comment: lateral scoot pivot transfer, towards R, mod/max assist for lift off, lateral movement and overall trunk control/balance    Ambulation/Gait               General Gait Details: unsafe/unable   Stairs             Wheelchair Mobility     Tilt Bed    Modified Rankin (Stroke Patients Only)       Balance Overall balance assessment: Needs assistance Sitting-balance support: No upper extremity supported, Feet supported Sitting balance-Leahy Scale: Fair Sitting balance - Comments: maintains static sitting with close sup this date (small wedge under R IT for passive closure of R lateral trunk)                                    Communication    Cognition Arousal: Alert Behavior During Therapy: WFL for tasks assessed/performed   PT - Cognitive impairments: No apparent impairments                       PT - Cognition Comments: very engaged and motivated to Production assistant, radio Other Exercises  Other Exercises: Unsupported sitting edge of bed, R UE in facilitated WBing, participated with L UE dynamic reaching (to L upper/lateral quadrants), emphasis on postural extension and lateral flexion of R trunk musculature, close sup/min assist.  Gradually progressed to and beyond midline with min assist for balance/midline recovery with dynamic reaching to R of midline. Other Exercises: Lateral scooting edge of bed, max assist for lift off and lateral movement; step by step cuing for sequence, weight shift and overall comprehension of new movement pattern    General Comments        Pertinent Vitals/Pain Pain Assessment Pain  Assessment: No/denies pain    Home Living                          Prior Function            PT Goals (current goals can now be found in the care plan section) Acute Rehab PT Goals Patient Stated Goal: to get as much back as i can PT Goal Formulation: With patient/family Time For Goal Achievement: 09/01/24 Potential to Achieve Goals: Good Progress towards PT goals: Progressing toward goals    Frequency    Min 3X/week      PT Plan      Co-evaluation              AM-PAC PT 6 Clicks Mobility   Outcome Measure  Help needed turning from your back to your side while in a flat bed without using bedrails?: A Lot Help needed moving from lying on your back to sitting on the side of a flat bed without using bedrails?: A Lot Help needed moving to and from a bed to a chair (including a wheelchair)?: A Lot Help needed standing up from a chair using your arms (e.g., wheelchair or bedside chair)?: A Lot Help needed to walk in hospital room?: Total Help needed climbing 3-5 steps with a railing? : Total 6 Click Score: 10    End of Session   Activity Tolerance: Patient tolerated treatment well Patient left: in chair;with call bell/phone within reach;with chair alarm set Nurse Communication: Mobility status PT Visit Diagnosis: Hemiplegia and hemiparesis Hemiplegia - Right/Left: Right Hemiplegia - dominant/non-dominant: Dominant Hemiplegia - caused by: Cerebral infarction     Time: 8582-8546 PT Time Calculation (min) (ACUTE ONLY): 36 min  Charges:    $Therapeutic Activity: 8-22 mins $Neuromuscular Re-education: 8-22 mins PT General Charges $$ ACUTE PT VISIT: 1 Visit                    Chimaobi Casebolt H. Delores, PT, DPT, NCS 08/20/24, 3:06 PM [phone]

## 2024-08-21 ENCOUNTER — Other Ambulatory Visit (HOSPITAL_COMMUNITY): Payer: Self-pay

## 2024-08-21 DIAGNOSIS — I639 Cerebral infarction, unspecified: Secondary | ICD-10-CM | POA: Diagnosis not present

## 2024-08-21 LAB — CBC
HCT: 43.9 % (ref 39.0–52.0)
Hemoglobin: 15.1 g/dL (ref 13.0–17.0)
MCH: 33.9 pg (ref 26.0–34.0)
MCHC: 34.4 g/dL (ref 30.0–36.0)
MCV: 98.4 fL (ref 80.0–100.0)
Platelets: 224 K/uL (ref 150–400)
RBC: 4.46 MIL/uL (ref 4.22–5.81)
RDW: 12.7 % (ref 11.5–15.5)
WBC: 6.8 K/uL (ref 4.0–10.5)
nRBC: 0 % (ref 0.0–0.2)

## 2024-08-21 LAB — BASIC METABOLIC PANEL WITH GFR
Anion gap: 10 (ref 5–15)
BUN: 27 mg/dL — ABNORMAL HIGH (ref 8–23)
CO2: 26 mmol/L (ref 22–32)
Calcium: 9.8 mg/dL (ref 8.9–10.3)
Chloride: 103 mmol/L (ref 98–111)
Creatinine, Ser: 1.25 mg/dL — ABNORMAL HIGH (ref 0.61–1.24)
GFR, Estimated: >60 mL/min (ref >60)
Glucose, Bld: 105 mg/dL — ABNORMAL HIGH (ref 70–99)
Potassium: 3.6 mmol/L (ref 3.5–5.1)
Sodium: 139 mmol/L (ref 135–145)

## 2024-08-21 NOTE — Progress Notes (Signed)
 Inpatient Rehab Admissions Coordinator:  Spoke with wife Diane on the telephone. She acknowledged  understanding of CIR goals and expectations. She is supportive of pt pursuing CIR. She confirmed that she and other family will be able to provide support for pt after discharge. Will continue to follow.   Tinnie Yvone Cohens, MS, CCC-SLP Admissions Coordinator [phone]

## 2024-08-21 NOTE — Progress Notes (Signed)
 Speech Language Pathology Treatment: Dysphagia;Cognitive-Linguistic  Patient Details Name: Terry [name] MRN: [id number] DOB: 12-27-1951 Today's Date: 08/21/2024 Time: 1100-1200 SLP Time Calculation (min) (ACUTE ONLY): 60 min  Assessment / Plan / Recommendation Clinical Impression  Patient seen today for ongoing assessment and tx of Dysarthria, and monitoring of any swallowing issues/dysphagia. Pt was awake/alert; verbal. Wife and 3 Family members present. Pt was A/Ox4. He was verbal and engaged appropriately in conversation; quite talkative and intermittently distracted w/ the multiple, ongoing conversations in the room at times. He followed instructions appropriately. Noted continued RUE flaccidness.  On RA, afebrile. WBC wnl.  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 Per his last tx note in 2022:  Pt reports that he is making progress with his frustration level as well as increased use of word finding strategies during social situations, including stressful situation.  He also reports that he is focused on using a slower rate to increase his speech intelligibility. He also reports that he has been recording himself talking and playing it back to keep his speech calibrated. At this time, pt requests discharge from ST as he has met all STG and LTGs.   Pt completed speech/fluency and articulation tasks; OM exs. Speech intelligibility was 75-100% during individual speech tasks and general conversation- noted pt talked quickly w/ a fast rate of speech. When he slowed his rate of speech and utilized his strategies of BIG, LOUD, SLOW, articulatory precision and intelligibility of speech was much improved; pt and Family present noted the same.  Pt and Family endorsed they spoke in casual speech w/out full enunciation(ie, dropping endings of words) at home at Baseline: we all talk a lot and we talk fast!.  Pt exhibited motor speech  deficits c/b Dysarthria; decreased articulation of speech sounds. Deficits c/b imprecise consonants and increased rate of speech. Vocal quality and resonance appeared [facility].  OM weakness of Right lingual/labial weakness and decreased ROM/Symmetry and min lingual deviation to the R noted(unsure if new?) continue; lingual strength/ROM appeared WFL in isolated movements, but suspect decreased in connected movements such as speech. Decreased R labial-facial tone/strength continues. Pt endorsed min R labial wetness/drooling at lips though no overt drooling noted during session.    SLP provided education re: importance of skilled ST services to address/re-educate on speech-related strategies to combat the Dysarthria and improve speech intelligibility. Pt and Family present educated on speech/articulatory strategies to include: increasing loudness(w/ appropriate breath support), slowing rate of speech, and over-articulation: BIG, LOUD, SLOW as posted in room on wall. These were practiced w/ Menu reading and automatic speech tasks.   Education and handouts given on the strategies; practiced w/ pt and Son. Questions answered.     Pt was seen briefly for toleration of diet and education on aspiration precautions d/t pt exhibiting coughing 1x during drinking thin liquids via Straw during visit. Pt has been tolerating his currently ordered Regular consistency diet/thin liquids per pt/Family report and appears to present w/ functional oropharyngeal phase swallowing. Pt appears at reduced risk for aspiration following general aspiration precautions and w/ tray setup/prep.   During po trials of thin liquids, pt consumed Multiple sips using a Straw(a larger sip 1x per pt report) w/ overt coughing following 1x. When taking Single sips via Cup, no further coughing, no decline in vocal quality, nor change in respiratory presentation during/post trials noted. Oral phase appeared [facility] w/ timely bolus management and contrl of  bolus propulsion for  A-P transfer for swallowing. Pt denied any consistent swallowing issues and stated he knew he had to be careful- I've had to be careful since my last Stroke. Educated on aspiration precautions (as posted in room for pt/Family) and strongly encouraged use of CUP DRINKING; NO STRAWS USE as well as PILLS WHOLE IN PUREE. Pt and Family agreed when given explanation and description of consequence of aspiration pneumonia.   Recommend continue a fairly Regular consistency diet w/ well-Cut meats, moistened foods; Thin liquids -- via Cup Only. Recommend general aspiration precautions to include Small bites/sips Slowly and ensure oral clearing using lingual/finger sweep when needed. Must sit fully upright for all oral intake. FULL TRAY/MEAL PREP at meals d/t pt unable to use RUE. Less distractions and talking during meals. Pills WHOLE in Puree for best oral control and safer swallowing -- it is encouraged now and for D/C to the pt.  Education given on Pills in Puree; food consistencies and easy to eat options; general aspiration precautions(NO Straw use currently); tray prep needs to pt. Recommend Dietician f/u for support. Precautions posted in chart, room. MD/NSG to reconsult if any new needs arise. NSG updated, agreed. MD updated.        HPI HPI: Pt is a 73 y.o. 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  No c/o difficulty swallowing.   MRI brain showed lacunar infarction in the left frontal radiata/lentiform nuclei with mild expansion on follow-up CT.      SLP Plan  Continue with current plan of care          Recommendations  Diet recommendations: Regular;Thin liquid (gravies added to moisten; tray/food prep) Liquids provided via: Cup (less straw use if coughing noted) Medication Administration: Whole meds with puree Supervision: Patient able to self feed;Staff to assist with self feeding;Intermittent supervision to cue for compensatory strategies (unable to use RUE) Compensations: Minimize environmental distractions;Slow rate;Small sips/bites;Lingual sweep for clearance of pocketing;Follow solids with liquid Postural Changes and/or Swallow Maneuvers: Seated upright 90 degrees;Out of bed for meals;Upright 30-60 min after meal                Rehab consult Oral care BID;Oral care before and after PO;Patient independent with oral care;Staff/trained caregiver to provide oral care (setu and support of RUE)   Intermittent Supervision/Assistance (w/ speech exs and follow through) Dysarthria and anarthria (R47.1);Dysphagia, unspecified (R13.10)     Continue with current plan of care      [name], MS, CCC-SLP Speech Language Pathologist Rehab Services; [facility] [phone] (ascom) Jalayah Gutridge  08/21/2024, 2:22 PM

## 2024-08-21 NOTE — Progress Notes (Signed)
 PHARMACIST - PHYSICIAN COMMUNICATION  CONCERNING:  Pradaxa  (dabigatran )  Patient would like to fill dabigatran  prescription with [facility] (online discount pharmacy). [facility] requires the patient's email address that is used to create an account with the pharmacy to be included in the e-script.  Per patient, preferred email address: [email or web address]  When sending script to [facility], please ensure the email address listed above is included in the script so it can be linked with the patient's account and mailed without delays.  For any questions, please contact [facility] pharmacy department at [phone].  Thank you for involving pharmacy in this patient's care.   Damien Napoleon, PharmD Clinical Pharmacist 08/21/2024 2:24 PM

## 2024-08-21 NOTE — Progress Notes (Signed)
 Physical Therapy Treatment Patient Details Name: Terry [name] MRN: [id number] DOB: January 12, 1951 Today's Date: 08/21/2024   History of Present Illness Pt is a 73 y.o. male presented with new onset of right-sided weakness and fall. MRI this AM confirmed lacunar infarct and stat CT completed around 1230pm with evidence of mild extension of the left deep white and deep gray matter lacunar infarct. Cleared by neuro for participation with evaluation. 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 Patient able to utilize LLE to advance R LE towards EOB with overall modA+2 to complete bed mobility. Able to maintain sitting balance with supervision and participated in modified sit ups with therapist behind patient with cues for posterior lean. Difficulty with core activation and utilizing L UE and LE to assist with sit up. Stood from EOB with modA+2 and HHAx2 with blocking of R knee and foot. Took 2 steps with LLE towards recliner but required maxA+2 for R LE advancement and to maintain balance. Provided patient with printed out HEP with exercises listed below (with access code). Wife and sister in law present during session and supportive. Discharge plan remains appropriate.   Access Code: [email or web address] URL: [email or web address] Date: 08/21/2024 Prepared by: Maryanne Finder  Exercises - Seated Scapular Retraction  - 2-3 x daily - 5-7 x weekly - 10 reps - Seated Shoulder Shrugs (NO dumbbell like picture)  - 2-3 x daily - 7 x weekly - 10 reps - Supine or Seated Quad Sets  - 2-3 x daily - 7 x weekly - 10 reps -Chair sit ups (written in - no picture available) -Hip adduction (written in- no picture available) -Weightbearing on elbow in chair (written in - no picture available)    If plan is discharge home, recommend the following: Two people to help with walking and/or transfers;Two people to help with bathing/dressing/bathroom   Can travel by private vehicle        Equipment Recommendations  Other (comment) (TBD)    Recommendations for Other Services       Precautions / Restrictions Precautions Precautions: Fall Recall of Precautions/Restrictions: Intact Precaution/Restrictions Comments: R hemiplegia Restrictions Weight Bearing Restrictions Per Provider Order: No     Mobility  Bed Mobility Overal bed mobility: Needs Assistance Bed Mobility: Supine to Sit     Supine to sit: Mod assist, +2 for physical assistance, +2 for safety/equipment     General bed mobility comments: instructed patient to utilize L LE to advance R LE by hooking underneath with good follow through. Overall modA+2 with assistance for RLE and trunk elevation to EOB    Transfers Overall transfer level: Needs assistance Equipment used: 2 person hand held assist Transfers: Sit to/from Stand, Bed to chair/wheelchair/BSC Sit to Stand: Mod assist, +2 physical assistance, +2 safety/equipment Stand pivot transfers: Max assist, +2 physical assistance, +2 safety/equipment         General transfer comment: Blocking of R knee and foot to stand with support provided to RUE. Able to initiate quad set in standing but continued assist to prevent knee buckling with weightbearing. Able to take 2 steps with L LE and maxA for positioning R LE during transfer. Slight activation of hip adductors to bring R LE towards chair    Ambulation/Gait  General Gait Details: unsafe/unable   Stairs             Wheelchair Mobility     Tilt Bed    Modified Rankin (Stroke Patients Only)       Balance Overall balance assessment: Needs assistance Sitting-balance support: No upper extremity supported, Feet supported Sitting balance-Leahy Scale: Fair Sitting balance -  Comments: able to maintain static sitting with supervision   Standing balance support: Bilateral upper extremity supported Standing balance-Leahy Scale: Zero Standing balance comment: Max A x2 to maintain standing balance with HHA on L side and RLE blocking on R side to prevent buckling and support of RUE to prevent injury to shoulder                            Communication Communication Communication: Impaired Factors Affecting Communication: Reduced clarity of speech (mild dysarthria due to R facial droop , however improved from initial evaluation)  Cognition Arousal: Alert Behavior During Therapy: WFL for tasks assessed/performed   PT - Cognitive impairments: No apparent impairments                       PT - Cognition Comments: motivated to participate Following commands: Intact      Cueing Cueing Techniques: Verbal cues, Tactile cues  Exercises Other Exercises Other Exercises: Modified sit ups on EOB with therapist behind patient to cue for posterior leanx5 - difficulty with core activation Other Exercises: Quad sets in long sitting x 5 with patient only able to hold contraction for 1-2 seconds Other Exercises: hip adduction (slight activation and movement) x 5 Other Exercises: Lateral lean on elbow in recliner for weighbearing through R UE with L hand supporting R elbow to maintain position    General Comments        Pertinent Vitals/Pain Pain Assessment Pain Assessment: No/denies pain    Home Living                          Prior Function            PT Goals (current goals can now be found in the care plan section) Acute Rehab PT Goals Patient Stated Goal: to get as much back as i can PT Goal Formulation: With patient/family Time For Goal Achievement: 09/01/24 Potential to Achieve Goals: Good Progress towards PT goals: Progressing toward goals    Frequency    Min 3X/week      PT Plan      Co-evaluation               AM-PAC PT 6 Clicks Mobility   Outcome Measure  Help needed turning from your back to your side while in a flat bed without using bedrails?: A Lot Help needed moving from lying on your back to sitting on the side of a flat bed without using bedrails?: A Lot Help needed moving to and from a bed to a chair (including a wheelchair)?: Total Help needed standing up from a chair using your arms (e.g., wheelchair or bedside chair)?: Total Help needed to walk in hospital room?: Total Help needed climbing 3-5 steps with a railing? : Total 6 Click Score: 8    End of Session Equipment Utilized During Treatment: Gait belt Activity Tolerance: Patient tolerated treatment well Patient left: in chair;with call bell/phone within reach;with chair alarm set;with family/visitor present Nurse Communication: Mobility status PT Visit Diagnosis: Hemiplegia  and hemiparesis Hemiplegia - Right/Left: Right Hemiplegia - dominant/non-dominant: Dominant Hemiplegia - caused by: Cerebral infarction     Time: 9046-8965 PT Time Calculation (min) (ACUTE ONLY): 41 min  Charges:    $Therapeutic Exercise: 8-22 mins $Therapeutic Activity: 8-22 mins PT General Charges $$ ACUTE PT VISIT: 1 Visit                     Maryanne Finder, PT, DPT Physical Therapist - [facility]  [facility]   Suetta Hoffmeister A Lula Kolton 08/21/2024, 11:15 AM

## 2024-08-21 NOTE — Progress Notes (Signed)
 Occupational Therapy Treatment Patient Details Name: Terry [name] MRN: [id number] DOB: Feb 09, 1951 Today's Date: 08/21/2024   History of present illness Pt is a 73 y.o. male presented with new onset of right-sided weakness and fall. MRI this AM confirmed lacunar infarct and stat CT completed around 1230pm with evidence of mild extension of the left deep white and deep gray matter lacunar infarct. Cleared by neuro for participation with evaluation. 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 Pleasant and agreeable to PT/OT co-tx session to maximize pt/therapist safety. He denies pain. Pt performed bed mobility with Mod A x2. He maintained seated balance at EOB with supervision. Performed modified sit ups at EOB with therapist support from behind for posterior lean to maximize core strength/activation. Pt required Mod A x2 for STS from EOB x2 trials with RLE blocking, RUE to support shoulder and prevent injury and HHA on L side. Able to stand pivot to recliner via Max A x2 with Max A for RLE management. Edu pt and wife and provided handout to maximize carryover of shoulder shrugs, shoulder protraction/retraction, weightbearing to RUE and PROM to RUE. He has trace activation with shoulder protraction/retraction and elevation/depression this date. He is highly motivated to participate and improve. Pt left seated in recliner with good balance with all needs in place and will cont to require skilled acute OT services to maximize his safety and IND to return to PLOF.       If plan is discharge home, recommend the following:  A lot of help with bathing/dressing/bathroom;Two people to help with walking and/or transfers;Help with stairs or ramp for entrance;Assistance with cooking/housework   Equipment  Recommendations  Other (comment) (defer)    Recommendations for Other Services      Precautions / Restrictions Precautions Precautions: Fall Recall of Precautions/Restrictions: Intact Precaution/Restrictions Comments: R hemiplegia Restrictions Weight Bearing Restrictions Per Provider Order: No       Mobility Bed Mobility Overal bed mobility: Needs Assistance Bed Mobility: Supine to Sit     Supine to sit: Mod assist, +2 for physical assistance, +2 for safety/equipment     General bed mobility comments: able to scoot buttocks towards EOB and move BLEs towards EOB using LLE to assist RLE and continues to have no functional use of RUE    Transfers Overall transfer level: Needs assistance Equipment used: 2 person hand held assist Transfers: Sit to/from Stand, Bed to chair/wheelchair/BSC Sit to Stand: Mod assist, +2 physical assistance, +2 safety/equipment Stand pivot transfers: Max assist, +2 physical assistance, +2 safety/equipment         General transfer comment: transition to standing from EOB x2 trials with Mod A x2 with HHA on LUE and support of RUE as well as blocking of RLE in standing to prevent buckling; took two steps on LLE with assist for RLE to get to recliner     Balance Overall balance assessment: Needs assistance Sitting-balance support: No upper extremity supported, Feet supported Sitting balance-Leahy Scale: Fair Sitting balance - Comments: supervision at EOB   Standing balance support: Bilateral upper extremity supported Standing balance-Leahy Scale: Zero Standing balance comment: Max A x2 to maintain standing balance with HHA on L side and RLE blocking on R side to prevent buckling and support of RUE to prevent injury to shoulder  ADL either performed or assessed with clinical judgement   ADL Overall ADL's : Needs assistance/impaired                                       General ADL Comments:  session focused on neuro-re-ed and getting pt to transfer to recliner; edu on shoulder shrugs, protraction/retraction and PROM to RUE in all planes    Extremity/Trunk Assessment Upper Extremity Assessment RUE Deficits / Details: trace activation in supine of tricep, abductor pollis brevis, scapular protraction/retraction            Vision       Perception     Praxis     Communication Communication Communication: Impaired Factors Affecting Communication: Reduced clarity of speech (mild dysarthria due to R facial droop , however improved from initial evaluation)   Cognition Arousal: Alert Behavior During Therapy: WFL for tasks assessed/performed                                 Following commands: Intact        Cueing   Cueing Techniques: Verbal cues, Tactile cues  Exercises Other Exercises Other Exercises: Pt/wife were educated on modified sit ups in recliner/bed as pt did perform with PT/OT at EOB with PT behind for cueing with posterior lean. Other Exercises: Edu on shoulder shrugs and protraction/retraction, as well as weightbearing through RUE with LUE support at elbow and PROM of RUE education to pt and his wife with handout provided.    Shoulder Instructions       General Comments      Pertinent Vitals/ Pain       Pain Assessment Pain Assessment: No/denies pain  Home Living                                          Prior Functioning/Environment              Frequency  Min 3X/week        Progress Toward Goals  OT Goals(current goals can now be found in the care plan section)  Progress towards OT goals: Progressing toward goals  Acute Rehab OT Goals Patient Stated Goal: get better OT Goal Formulation: With patient/family Time For Goal Achievement: 09/01/24 Potential to Achieve Goals: Fair  Plan      Co-evaluation    PT/OT/SLP Co-Evaluation/Treatment: Yes Reason for Co-Treatment: Complexity of the patient's  impairments (multi-system involvement);For patient/therapist safety;To address functional/ADL transfers PT goals addressed during session: Mobility/safety with mobility OT goals addressed during session: Strengthening/ROM      AM-PAC OT 6 Clicks Daily Activity     Outcome Measure   Help from another person eating meals?: A Little Help from another person taking care of personal grooming?: A Lot Help from another person toileting, which includes using toliet, bedpan, or urinal?: A Lot Help from another person bathing (including washing, rinsing, drying)?: A Lot Help from another person to put on and taking off regular upper body clothing?: A Lot Help from another person to put on and taking off regular lower body clothing?: A Lot 6 Click Score: 13    End of Session Equipment Utilized During Treatment: Gait belt  OT Visit Diagnosis: Other abnormalities of gait and mobility (R26.89);Unsteadiness  on feet (R26.81);Muscle weakness (generalized) (M62.81);Hemiplegia and hemiparesis Hemiplegia - Right/Left: Right Hemiplegia - dominant/non-dominant: Dominant Hemiplegia - caused by: Cerebral infarction   Activity Tolerance Patient tolerated treatment well   Patient Left with call bell/phone within reach;in chair;with chair alarm set   Nurse Communication Mobility status        Time: 9046-8968 OT Time Calculation (min): 38 min  Charges: OT General Charges $OT Visit: 1 Visit OT Treatments $Therapeutic Activity: 8-22 mins  Cleaster Shiffer, OTR/L  08/21/24, 1:22 PM   Faren Florence E Cowan Pilar 08/21/2024, 1:17 PM

## 2024-08-21 NOTE — Progress Notes (Signed)
 PROGRESS NOTE    Terry [name]  FMW:[id number] DOB: June 27, 1951 DOA: 08/18/2024 PCP: [name], MD  Assessment & Plan:   Active Problems:   Acute CVA (cerebrovascular accident) [facility])   CVA (cerebral vascular accident) (HCC)  Assessment and Plan: CVA: w/ right sided paresis. Continue on aspirin  & started on pradaxa . Hx of previous CVA w/ residual aphasia. PT/OT recs CIR. Pt agrees to CIR. Waiting on insurance auth to go to CIR   AKI: w/ mild left hydronephrosis as per renal US . Cr is labile    PAF: continue on pradaxa  as per neuro    Hx of mitral prolapse: s/p bovine mitral valve replacement. Echo shows EF 40-45%, LV global hypokinesis, grade I diastolic dysfunction, mild MR, mod-severe MS, mod AS. Will need to f/u outpatient w/ cardio      DVT prophylaxis: lovenox   Code Status: full  Family Communication: Disposition Plan: possibly d/c to inpatient rehab  Level of care: Med-Surg  Status is: Inpatient Remains inpatient appropriate because: waiting on insurance auth to go to CIR      Consultants:  Neuro   Procedures:   Antimicrobials:    Subjective: Pt c/o not being able to move right arm & right leg.   Objective: Vitals:   08/20/24 1955 08/20/24 2355 08/21/24 0355 08/21/24 0755  BP: 138/82 (!) 132/99 130/86 (!) 133/92  Pulse: 90 86 91   Resp:    18  Temp: 98.2 F (36.8 C) 97.6 F (36.4 C) 98 F (36.7 C) (!) 97.5 F (36.4 C)  TempSrc: Oral Oral Oral Oral  SpO2: 95% 96% 94% 98%  Weight:      Height:        Intake/Output Summary (Last 24 hours) at 08/21/2024 0857 Last data filed at 08/20/2024 0900 Gross per 24 hour  Intake --  Output 400 ml  Net -400 ml   Filed Weights   08/18/24 0321  Weight: 82 kg    Examination:  General exam: Appears calm & comfortable  Respiratory system: clear breath sounds b/l  Cardiovascular system: S1 & S2+. No rubs or clicks  Gastrointestinal system: abd is soft, NT, ND & hypoactive bowel sounds. Central  nervous system: alert & oriented. Unable to move right arm & right leg  Psychiatry: judgement and insight appears at baseline. Appropriate mood and affect    Data Reviewed: I have personally reviewed following labs and imaging studies  CBC: Recent Labs  Lab 08/18/24 0321 08/20/24 0603 08/21/24 0406  WBC 7.8 8.1 6.8  NEUTROABS 5.9  --   --   HGB 13.7 14.8 15.1  HCT 39.3 41.5 43.9  MCV 99.2 96.5 98.4  PLT 176 204 224   Basic Metabolic Panel: Recent Labs  Lab 08/18/24 0321 08/18/24 0341 08/19/24 0614 08/20/24 0603 08/21/24 0406  NA 142  --  139 139 139  K 4.4  --  4.5 3.7 3.6  CL 107  --  108 108 103  CO2 25  --  21* 22 26  GLUCOSE 122*  --  100* 113* 105*  BUN 16  --  23 23 27*  CREATININE 1.53*  --  1.23 1.16 1.25*  CALCIUM  9.4  --  9.4 9.6 9.8  MG  --  1.9  --   --   --    GFR: Estimated Creatinine Clearance: 53.2 mL/min (A) (by C-G formula based on SCr of 1.25 mg/dL (H)). Liver Function Tests: Recent Labs  Lab 08/18/24 0321  AST 24  ALT 20  ALKPHOS 80  BILITOT 0.8  PROT 6.8  ALBUMIN 3.9   No results for input(s): LIPASE, AMYLASE in the last 168 hours. No results for input(s): AMMONIA in the last 168 hours. Coagulation Profile: Recent Labs  Lab 08/18/24 0321  INR 1.3*   Cardiac Enzymes: No results for input(s): CKTOTAL, CKMB, CKMBINDEX, TROPONINI in the last 168 hours. BNP (last 3 results) No results for input(s): PROBNP in the last 8760 hours. HbA1C: No results for input(s): HGBA1C in the last 72 hours.  CBG: No results for input(s): GLUCAP in the last 168 hours. Lipid Profile: Recent Labs    08/19/24 0614  CHOL 120  HDL 30*  LDLCALC 71  TRIG 96  CHOLHDL 4.0   Thyroid Function Tests: No results for input(s): TSH, T4TOTAL, FREET4, T3FREE, THYROIDAB in the last 72 hours.  Anemia Panel: No results for input(s): VITAMINB12, FOLATE, FERRITIN, TIBC, IRON, RETICCTPCT in the last 72 hours. Sepsis  Labs: No results for input(s): PROCALCITON, LATICACIDVEN in the last 168 hours.  Recent Results (from the past 240 hours)  Group A Strep by PCR ([facility] Only)     Status: None   Collection Time: 08/18/24  3:41 AM   Specimen: Throat; Sterile Swab  Result Value Ref Range Status   Group A Strep by PCR NOT DETECTED NOT DETECTED Final    Comment: Performed at [facility], [address] [place], [place] [address]  Resp panel by RT-PCR (RSV, Flu A&B, Covid) Throat     Status: None   Collection Time: 08/18/24  3:41 AM   Specimen: Throat; Nasal Swab  Result Value Ref Range Status   SARS Coronavirus 2 by RT PCR NEGATIVE NEGATIVE Final    Comment: (NOTE) SARS-CoV-2 target nucleic acids are NOT DETECTED.  The SARS-CoV-2 RNA is generally detectable in upper respiratory specimens during the acute phase of infection. The lowest concentration of SARS-CoV-2 viral copies this assay can detect is 138 copies/mL. A negative result does not preclude SARS-Cov-2 infection and should not be used as the sole basis for treatment or other patient management decisions. A negative result may occur with  improper specimen collection/handling, submission of specimen other than nasopharyngeal swab, presence of viral mutation(s) within the areas targeted by this assay, and inadequate number of viral copies(<138 copies/mL). A negative result must be combined with clinical observations, patient history, and epidemiological information. The expected result is Negative.  Fact Sheet for Patients:  BloggerCourse.com  Fact Sheet for Healthcare Providers:  SeriousBroker.it  This test is no t yet approved or cleared by the [place]  FDA and  has been authorized for detection and/or diagnosis of SARS-CoV-2 by FDA under an Emergency Use Authorization (EUA). This EUA will remain  in effect (meaning this test can be used) for the duration of the COVID-19  declaration under Section 564(b)(1) of the Act, 21 U.S.C.section 360bbb-3(b)(1), unless the authorization is terminated  or revoked sooner.       Influenza A by PCR NEGATIVE NEGATIVE Final   Influenza B by PCR NEGATIVE NEGATIVE Final    Comment: (NOTE) The Xpert Xpress SARS-CoV-2/FLU/RSV plus assay is intended as an aid in the diagnosis of influenza from Nasopharyngeal swab specimens and should not be used as a sole basis for treatment. Nasal washings and aspirates are unacceptable for Xpert Xpress SARS-CoV-2/FLU/RSV testing.  Fact Sheet for Patients: BloggerCourse.com  Fact Sheet for Healthcare Providers: SeriousBroker.it  This test is not yet approved or cleared by the [place]  FDA and has been authorized  for detection and/or diagnosis of SARS-CoV-2 by FDA under an Emergency Use Authorization (EUA). This EUA will remain in effect (meaning this test can be used) for the duration of the COVID-19 declaration under Section 564(b)(1) of the Act, 21 U.S.C. section 360bbb-3(b)(1), unless the authorization is terminated or revoked.     Resp Syncytial Virus by PCR NEGATIVE NEGATIVE Final    Comment: (NOTE) Fact Sheet for Patients: BloggerCourse.com  Fact Sheet for Healthcare Providers: SeriousBroker.it  This test is not yet approved or cleared by the [place]  FDA and has been authorized for detection and/or diagnosis of SARS-CoV-2 by FDA under an Emergency Use Authorization (EUA). This EUA will remain in effect (meaning this test can be used) for the duration of the COVID-19 declaration under Section 564(b)(1) of the Act, 21 U.S.C. section 360bbb-3(b)(1), unless the authorization is terminated or revoked.  Performed at [facility], [address] [place], [place] [address]   Hsv Culture And Typing     Status: None   Collection Time: 08/18/24  4:00 AM   Result Value Ref Range Status   HSV Culture/Type Comment  Final    Comment: (NOTE) This virus culture is contaminated with bacteria and/or fungi. The specimen will be retreated with antibiotics and cell culture will be repeated. Culture report to follow. Performed At: [facility] [address] [place], [place] [id number] Jennette Shorter MD Ey:[id number]    Source of Sample THROAT  Final    Comment: Performed at [facility], [address], [place], [place] [address]         Radiology Studies: No results found.       Scheduled Meds:  aspirin  EC  81 mg Oral Daily   atorvastatin   80 mg Oral QHS   dabigatran   150 mg Oral Q12H   melatonin  5 mg Oral QHS   pantoprazole   40 mg Oral Daily   Continuous Infusions:   LOS: 2 days     Anthony CHRISTELLA Pouch, MD [facility] Pager [phone]  If 7PM-7AM, please contact night-coverage [email or web address] 08/21/2024, 8:57 AM

## 2024-08-21 NOTE — Progress Notes (Signed)
 Inpatient Rehab Admissions Coordinator:  ?Insurance authorization started. Will continue to follow. ? ? ?Tinnie Yvone Cohens, MS, CCC-SLP ?Admissions Coordinator ?[phone] ? ?

## 2024-08-21 NOTE — Discharge Instructions (Signed)
Information on my medicine - Pradaxa® (dabigatran) ° °This medication education was reviewed with me or my healthcare representative as part of my discharge preparation.  ° °Why was Pradaxa® prescribed for you? °Pradaxa® was prescribed for you to reduce the risk of forming blood clots that cause a stroke if you have a medical condition called atrial fibrillation (a type of irregular heartbeat).   ° °What do you Need to know about PradAXa®? °Take your Pradaxa® TWICE DAILY - one capsule in the morning and one tablet in the evening with or without food.  It would be best to take the doses about the same time each day. ° °The capsules should not be broken, chewed or opened - they must be swallowed whole. ° °Do not store Pradaxa in other medication containers - once the bottle is opened the Pradaxa should be used within FOUR months; throw away any capsules that haven’t been by that time. ° °Take Pradaxa® exactly as prescribed by your doctor.  DO NOT stop taking Pradaxa® without talking to the doctor who prescribed the medication.  Stopping without other stroke prevention medication to take the place of Pradaxa may increase your risk of developing a clot that causes a stroke.  Refill your prescription before you run out. ° °After discharge, you should have regular check-up appointments with your healthcare provider that is prescribing your Pradaxa®.  In the future your dose may need to be changed if your kidney function or weight changes by a significant amount. ° °What do you do if you miss a dose? °If you miss a dose, take it as soon as you remember on the same day.  If your next dose is less than 6 hours away, skip the missed dose.  Do not take two doses of PRADAXA at the same time. ° °Important Safety Information °A possible side effect of Pradaxa® is bleeding. You should call your healthcare provider right away if you experience any of the following: °? Bleeding from an injury or your nose that does not  stop. °? Unusual colored urine (red or dark brown) or unusual colored stools (red or black). °? Unusual bruising for unknown reasons. °? A serious fall or if you hit your head (even if there is no bleeding). ° °Some medicines may interact with Pradaxa® and might increase your risk of bleeding or clotting while on Pradaxa®. To help avoid this, consult your healthcare provider or pharmacist prior to using any new prescription or non-prescription medications, including herbals, vitamins, non-steroidal anti-inflammatory drugs (NSAIDs) and supplements. ° °This website has more information on Pradaxa® (dabigatran): https://www.pradaxa.com ° ° ° °

## 2024-08-21 NOTE — Plan of Care (Signed)
  Problem: Education: Goal: Knowledge of disease or condition will improve Outcome: Progressing   Problem: Coping: Goal: Will identify appropriate support needs Outcome: Progressing   Problem: Self-Care: Goal: Ability to communicate needs accurately will improve Outcome: Progressing   Problem: Education: Goal: Knowledge of General Education information will improve Description: Including pain rating scale, medication(s)/side effects and non-pharmacologic comfort measures Outcome: Progressing   Problem: Clinical Measurements: Goal: Will remain free from infection Outcome: Progressing Goal: Respiratory complications will improve Outcome: Progressing   Problem: Activity: Goal: Risk for activity intolerance will decrease Outcome: Not Progressing

## 2024-08-22 DIAGNOSIS — I639 Cerebral infarction, unspecified: Secondary | ICD-10-CM | POA: Diagnosis not present

## 2024-08-22 LAB — CBC
HCT: 44.4 % (ref 39.0–52.0)
Hemoglobin: 15.5 g/dL (ref 13.0–17.0)
MCH: 34.4 pg — ABNORMAL HIGH (ref 26.0–34.0)
MCHC: 34.9 g/dL (ref 30.0–36.0)
MCV: 98.7 fL (ref 80.0–100.0)
Platelets: 231 K/uL (ref 150–400)
RBC: 4.5 MIL/uL (ref 4.22–5.81)
RDW: 12.8 % (ref 11.5–15.5)
WBC: 7.3 K/uL (ref 4.0–10.5)
nRBC: 0 % (ref 0.0–0.2)

## 2024-08-22 LAB — BASIC METABOLIC PANEL WITH GFR
Anion gap: 9 (ref 5–15)
BUN: 34 mg/dL — ABNORMAL HIGH (ref 8–23)
CO2: 26 mmol/L (ref 22–32)
Calcium: 9.7 mg/dL (ref 8.9–10.3)
Chloride: 106 mmol/L (ref 98–111)
Creatinine, Ser: 1.2 mg/dL (ref 0.61–1.24)
GFR, Estimated: >60 mL/min (ref >60)
Glucose, Bld: 104 mg/dL — ABNORMAL HIGH (ref 70–99)
Potassium: 3.9 mmol/L (ref 3.5–5.1)
Sodium: 141 mmol/L (ref 135–145)

## 2024-08-22 LAB — HSV CULTURE AND TYPING

## 2024-08-22 NOTE — Plan of Care (Signed)
  Problem: Education: Goal: Knowledge of disease or condition will improve Outcome: Progressing Goal: Knowledge of secondary prevention will improve (MUST DOCUMENT ALL) Outcome: Progressing Goal: Knowledge of patient specific risk factors will improve (DELETE if not current risk factor) Outcome: Progressing   Problem: Ischemic Stroke/TIA Tissue Perfusion: Goal: Complications of ischemic stroke/TIA will be minimized Outcome: Progressing   Problem: Coping: Goal: Will verbalize positive feelings about self Outcome: Progressing Goal: Will identify appropriate support needs Outcome: Progressing   Problem: Health Behavior/Discharge Planning: Goal: Ability to manage health-related needs will improve Outcome: Progressing Goal: Goals will be collaboratively established with patient/family Outcome: Progressing   Problem: Self-Care: Goal: Ability to participate in self-care as condition permits will improve Outcome: Progressing Goal: Verbalization of feelings and concerns over difficulty with self-care will improve Outcome: Progressing Goal: Ability to communicate needs accurately will improve Outcome: Progressing   Problem: Nutrition: Goal: Risk of aspiration will decrease Outcome: Progressing Goal: Dietary intake will improve Outcome: Progressing   Problem: Education: Goal: Knowledge of General Education information will improve Description: Including pain rating scale, medication(s)/side effects and non-pharmacologic comfort measures Outcome: Progressing   Problem: Health Behavior/Discharge Planning: Goal: Ability to manage health-related needs will improve Outcome: Progressing   Problem: Clinical Measurements: Goal: Ability to maintain clinical measurements within normal limits will improve Outcome: Progressing Goal: Will remain free from infection Outcome: Progressing Goal: Diagnostic test results will improve Outcome: Progressing Goal: Respiratory complications will  improve Outcome: Progressing Goal: Cardiovascular complication will be avoided Outcome: Progressing   Problem: Activity: Goal: Risk for activity intolerance will decrease Outcome: Progressing   Problem: Nutrition: Goal: Adequate nutrition will be maintained Outcome: Progressing   Problem: Coping: Goal: Level of anxiety will decrease Outcome: Progressing   Problem: Elimination: Goal: Will not experience complications related to bowel motility Outcome: Progressing Goal: Will not experience complications related to urinary retention Outcome: Progressing   Problem: Pain Managment: Goal: General experience of comfort will improve and/or be controlled Outcome: Progressing   Problem: Safety: Goal: Ability to remain free from injury will improve Outcome: Progressing   Problem: Skin Integrity: Goal: Risk for impaired skin integrity will decrease Outcome: Progressing   Problem: Education: Goal: Knowledge of General Education information will improve Description: Including pain rating scale, medication(s)/side effects and non-pharmacologic comfort measures Outcome: Progressing

## 2024-08-22 NOTE — Progress Notes (Signed)
 PROGRESS NOTE   HPI was taken from Dr. Laurita:  Terry [name] is a 73 y.o. 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   Patient started to feel mild weakness on the right hand yesterday  right arm appears to be sleeping he brought attributed to possible pressure palsy.  But this morning, patient woke up after midnight around 230 and this time he developed both significant weakness of the right arm and right leg.  Patient was last known normal around 9 PM yesterday.  He takes Eliquis  for A-fib for last 6 years and has had 1 stroke in 2022 and 2 mini strokes in 2023 and 2024.   ED Course: Afebrile, no tachycardia no hypotension.  MRI showed right lacunar stroke in the right corona radiata.  CTA negative for LVO.  CTs of the tissue of neck negative for mass or infection or inflammation.  Blood work showed creatinine 1.5 BUN 16 glucose 122 hemoglobin 13.7 WBC 7.8.    Terry [name]  FMW:[id number] DOB: September 21, 1951 DOA: 08/18/2024 PCP: Terry Norleen BIRCH, MD  Assessment & Plan:   Active Problems:   Acute CVA (cerebrovascular accident) [facility])   CVA (cerebral vascular accident) (HCC)  Assessment and Plan: CVA: w/ right sided paresis. Continue on aspirin  & pradaxa  (pradaxa  was started this admission by neuro). Hx of previous CVA w/ residual aphasia. PT/OT recs CIR. Pt agrees to CIR. Waiting on insurance auth to be d/c to CIR still   AKI: w/ mild left hydronephrosis as per renal US . Cr is trending down from day prior    PAF: continue on pradaxa  as per neuro. Pradaxa  is not covered by pt's insurance but pt can get a 3 month  supply of pradaxa  for approx $90 at [facility] which is online discount pharmacy and the e-script needs to be sent and include the pt's email address: [email or web address]    Hx of  mitral prolapse: s/p bovine mitral valve replacement. Echo shows EF 40-45%, LV global hypokinesis, grade I diastolic dysfunction, mild MR, mod-severe MS, mod AS. Will need to f/u outpatient w/ cardio      DVT prophylaxis: pradaxa  Code Status: full  Family Communication: Disposition Plan: possibly d/c to inpatient rehab  Level of care: Med-Surg  Status is: Inpatient Remains inpatient appropriate because: medically stable. Waiting on insurance auth to go to CIR      Consultants:  Neuro   Procedures:   Antimicrobials:    Subjective: Pt still c/o not being able to move right arm & right leg.   Objective: Vitals:   08/21/24 2018 08/21/24 2355 08/22/24 0355 08/22/24 0755  BP: 117/83 119/86 (!) 108/50 136/80  Pulse: 86 88 87 71  Resp: 18 18 18 17   Temp: 98.1 F (36.7 C) 97.7 F (36.5 C) 98.1 F (36.7 C) (!) 97.5 F (36.4 C)  TempSrc:  Oral Oral Oral  SpO2: 99% 96% 98% 99%  Weight:      Height:        Intake/Output Summary (Last 24 hours) at 08/22/2024 0904 Last data filed at 08/21/2024 1900 Gross per 24 hour  Intake 480 ml  Output 300 ml  Net 180 ml   Filed Weights   08/18/24 0321  Weight: 82 kg    Examination:  General exam: appears comfortable  Respiratory system: clear breath sounds b/l  Cardiovascular system: S1/S2+. No rubs or clicks  Gastrointestinal system: abd is soft, NT, ND & hypoactive bowel sounds  Central nervous system: alert & oriented. Unable to move right arm & right leg  Psychiatry: judgement and insight appears at baseline. Appropriate mood and affect    Data Reviewed: I have personally reviewed following labs and imaging studies  CBC: Recent Labs  Lab 08/18/24 0321 08/20/24 0603 08/21/24 0406 08/22/24 0428  WBC 7.8 8.1 6.8 7.3  NEUTROABS 5.9  --   --   --   HGB 13.7 14.8 15.1 15.5  HCT 39.3 41.5 43.9 44.4  MCV 99.2 96.5 98.4 98.7  PLT 176 204 224 231   Basic Metabolic Panel: Recent Labs  Lab 08/18/24 0321  08/18/24 0341 08/19/24 0614 08/20/24 0603 08/21/24 0406 08/22/24 0428  NA 142  --  139 139 139 141  K 4.4  --  4.5 3.7 3.6 3.9  CL 107  --  108 108 103 106  CO2 25  --  21* 22 26 26   GLUCOSE 122*  --  100* 113* 105* 104*  BUN 16  --  23 23 27* 34*  CREATININE 1.53*  --  1.23 1.16 1.25* 1.20  CALCIUM  9.4  --  9.4 9.6 9.8 9.7  MG  --  1.9  --   --   --   --    GFR: Estimated Creatinine Clearance: 55.4 mL/min (by C-G formula based on SCr of 1.2 mg/dL). Liver Function Tests: Recent Labs  Lab 08/18/24 0321  AST 24  ALT 20  ALKPHOS 80  BILITOT 0.8  PROT 6.8  ALBUMIN 3.9   No results for input(s): LIPASE, AMYLASE in the last 168 hours. No results for input(s): AMMONIA in the last 168 hours. Coagulation Profile: Recent Labs  Lab 08/18/24 0321  INR 1.3*   Cardiac Enzymes: No results for input(s): CKTOTAL, CKMB, CKMBINDEX, TROPONINI in the last 168 hours. BNP (last 3 results) No results for input(s): PROBNP in the last 8760 hours. HbA1C: No results for input(s): HGBA1C in the last 72 hours.  CBG: No results for input(s): GLUCAP in the last 168 hours. Lipid Profile: No results for input(s): CHOL, HDL, LDLCALC, TRIG, CHOLHDL, LDLDIRECT in the last 72 hours.  Thyroid Function Tests: No results for input(s): TSH, T4TOTAL, FREET4, T3FREE, THYROIDAB in the last 72 hours.  Anemia Panel: No results for input(s): VITAMINB12, FOLATE, FERRITIN, TIBC, IRON, RETICCTPCT in the last 72 hours. Sepsis Labs: No results for input(s): PROCALCITON, LATICACIDVEN in the last 168 hours.  Recent Results (from the past 240 hours)  Group A Strep by PCR ([facility] Only)     Status: None   Collection Time: 08/18/24  3:41 AM   Specimen: Throat; Sterile Swab  Result Value Ref Range Status   Group A Strep by PCR NOT DETECTED NOT DETECTED Final    Comment: Performed at [facility], [address] [place], [place] [address]  Resp  panel by RT-PCR (RSV, Flu A&B, Covid) Throat     Status: None   Collection Time: 08/18/24  3:41 AM   Specimen: Throat; Nasal Swab  Result Value Ref Range Status   SARS Coronavirus 2 by RT PCR NEGATIVE NEGATIVE Final    Comment: (NOTE) SARS-CoV-2 target nucleic acids are NOT DETECTED.  The SARS-CoV-2 RNA is generally detectable in upper respiratory specimens during the acute phase of infection. The lowest concentration of SARS-CoV-2 viral copies this assay can detect is 138 copies/mL. A negative result does not  preclude SARS-Cov-2 infection and should not be used as the sole basis for treatment or other patient management decisions. A negative result may occur with  improper specimen collection/handling, submission of specimen other than nasopharyngeal swab, presence of viral mutation(s) within the areas targeted by this assay, and inadequate number of viral copies(<138 copies/mL). A negative result must be combined with clinical observations, patient history, and epidemiological information. The expected result is Negative.  Fact Sheet for Patients:  BloggerCourse.com  Fact Sheet for Healthcare Providers:  SeriousBroker.it  This test is no t yet approved or cleared by the [place]  FDA and  has been authorized for detection and/or diagnosis of SARS-CoV-2 by FDA under an Emergency Use Authorization (EUA). This EUA will remain  in effect (meaning this test can be used) for the duration of the COVID-19 declaration under Section 564(b)(1) of the Act, 21 U.S.C.section 360bbb-3(b)(1), unless the authorization is terminated  or revoked sooner.       Influenza A by PCR NEGATIVE NEGATIVE Final   Influenza B by PCR NEGATIVE NEGATIVE Final    Comment: (NOTE) The Xpert Xpress SARS-CoV-2/FLU/RSV plus assay is intended as an aid in the diagnosis of influenza from Nasopharyngeal swab specimens and should not be used as a sole basis for  treatment. Nasal washings and aspirates are unacceptable for Xpert Xpress SARS-CoV-2/FLU/RSV testing.  Fact Sheet for Patients: BloggerCourse.com  Fact Sheet for Healthcare Providers: SeriousBroker.it  This test is not yet approved or cleared by the [place]  FDA and has been authorized for detection and/or diagnosis of SARS-CoV-2 by FDA under an Emergency Use Authorization (EUA). This EUA will remain in effect (meaning this test can be used) for the duration of the COVID-19 declaration under Section 564(b)(1) of the Act, 21 U.S.C. section 360bbb-3(b)(1), unless the authorization is terminated or revoked.     Resp Syncytial Virus by PCR NEGATIVE NEGATIVE Final    Comment: (NOTE) Fact Sheet for Patients: BloggerCourse.com  Fact Sheet for Healthcare Providers: SeriousBroker.it  This test is not yet approved or cleared by the [place]  FDA and has been authorized for detection and/or diagnosis of SARS-CoV-2 by FDA under an Emergency Use Authorization (EUA). This EUA will remain in effect (meaning this test can be used) for the duration of the COVID-19 declaration under Section 564(b)(1) of the Act, 21 U.S.C. section 360bbb-3(b)(1), unless the authorization is terminated or revoked.  Performed at [name], [address] [place], [place] [address]   Hsv Culture And Typing     Status: None   Collection Time: 08/18/24  4:00 AM  Result Value Ref Range Status   HSV Culture/Type Comment  Final    Comment: (NOTE) This virus culture is contaminated with bacteria and/or fungi. The specimen will be retreated with antibiotics and cell culture will be repeated. Culture report to follow. Performed At: [facility] [address] [place], [place] [id number] Jennette Shorter MD Ey:[id number]    Source of Sample THROAT  Final    Comment: Performed at [facility], [address] [place], [place] [address]         Radiology Studies: No results found.       Scheduled Meds:  aspirin  EC  81 mg Oral Daily   atorvastatin   80 mg Oral QHS   dabigatran   150 mg Oral Q12H   melatonin  5 mg Oral QHS   pantoprazole   40 mg Oral Daily   Continuous Infusions:   LOS: 3 days  Anthony CHRISTELLA Pouch, MD [facility] Pager [phone]  If 7PM-7AM, please contact night-coverage [email or web address] 08/22/2024, 9:04 AM

## 2024-08-22 NOTE — Progress Notes (Signed)
 Physical Therapy Treatment Patient Details Name: Terry [name] MRN: [id number] DOB: Jul 04, 1951 Today's Date: 08/22/2024   History of Present Illness Pt is a 73 y.o. male presented with new onset of right-sided weakness and fall. MRI this AM confirmed lacunar infarct and stat CT completed around 1230pm with evidence of mild extension of the left deep white and deep gray matter lacunar infarct. Cleared by neuro for participation with evaluation. 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 Reinforced utilizing LLE to assist R LE off bed, however overall required modA to complete bed mobility. Utilized Stedy to work on standing weight shifting and quad activation in standing. Requires min-modA to stand from [facility] and up to modA to maintain standing balance with heavy R lateral lean. Stood from [place] x 4 with patient requesting to utilize this to transfer to recliner this date. Educated patient and family on increasing self participation in ADLs and other daily activities to improve independence with care, patient verbalized understanding. Discharge plan remains appropriate.     If plan is discharge home, recommend the following: Two people to help with walking and/or transfers;Two people to help with bathing/dressing/bathroom   Can travel by private vehicle        Equipment Recommendations  Other (comment) (TBD)    Recommendations for Other Services       Precautions / Restrictions Precautions Precautions: Fall Recall of Precautions/Restrictions: Intact Precaution/Restrictions Comments: R hemiplegia     Mobility  Bed Mobility Overal bed mobility: Needs Assistance Bed Mobility: Supine to Sit     Supine to sit: Mod assist     General bed mobility  comments: able to hook LLE under R LE to assist with bringing R LE to EOB. Assist for trunk elevation    Transfers Overall transfer level: Needs assistance Equipment used: Ambulation equipment used Transfers: Sit to/from Stand, Bed to chair/wheelchair/BSC Sit to Stand: Mod assist, Min assist           General transfer comment: able to stand from EOB with modA+1 and from stedy flaps with minA+1. In standing, worked on weightshifting and R quad activation. R lateral lean in standing with focus on quad activation. Stood from [place] x 4 then moved over to Patent examiner via Financial trader: Stedy  Ambulation/Gait                   Stairs             Wheelchair Mobility     Tilt Bed    Modified Rankin (Stroke Patients Only)       Balance Overall balance assessment: Needs assistance Sitting-balance support: No upper extremity supported, Feet supported Sitting balance-Leahy Scale: Fair     Standing balance support: Single extremity supported, Reliant on assistive device for balance Standing balance-Leahy Scale: Zero                              Communication Communication Communication: Impaired Factors Affecting Communication: Reduced clarity of speech (mild dysarthria due to R facial droop , however improved from initial evaluation)  Cognition Arousal: Alert Behavior During Therapy: WFL for tasks assessed/performed   PT - Cognitive impairments: No apparent impairments  Following commands: Intact      Cueing Cueing Techniques: Verbal cues, Tactile cues  Exercises      General Comments        Pertinent Vitals/Pain Pain Assessment Pain Assessment: No/denies pain    Home Living                          Prior Function            PT Goals (current goals can now be found in the care plan section) Acute Rehab PT Goals PT Goal Formulation: With patient/family Time For Goal Achievement:  09/01/24 Potential to Achieve Goals: Good Progress towards PT goals: Progressing toward goals    Frequency    Min 3X/week      PT Plan      Co-evaluation              AM-PAC PT 6 Clicks Mobility   Outcome Measure  Help needed turning from your back to your side while in a flat bed without using bedrails?: A Lot Help needed moving from lying on your back to sitting on the side of a flat bed without using bedrails?: A Lot Help needed moving to and from a bed to a chair (including a wheelchair)?: Total Help needed standing up from a chair using your arms (e.g., wheelchair or bedside chair)?: Total Help needed to walk in hospital room?: Total Help needed climbing 3-5 steps with a railing? : Total 6 Click Score: 8    End of Session   Activity Tolerance: Patient tolerated treatment well Patient left: in chair;with call bell/phone within reach;with chair alarm set;with family/visitor present Nurse Communication: Mobility status PT Visit Diagnosis: Hemiplegia and hemiparesis Hemiplegia - Right/Left: Right Hemiplegia - dominant/non-dominant: Dominant Hemiplegia - caused by: Cerebral infarction     Time: 1110-1136 PT Time Calculation (min) (ACUTE ONLY): 26 min  Charges:    $Therapeutic Activity: 23-37 mins PT General Charges $$ ACUTE PT VISIT: 1 Visit                     Maryanne Finder, PT, DPT Physical Therapist - [facility]  [facility]   Syrai Gladwin A Sinead Hockman 08/22/2024, 2:14 PM

## 2024-08-22 NOTE — Progress Notes (Signed)
   Inpatient Rehabilitation Admissions Coordinator   I await insurance approval for possible Cir admit.  Heron Leavell, RN, MSN Rehab Admissions Coordinator [phone] 08/22/2024 11:45 AM

## 2024-08-22 NOTE — Progress Notes (Signed)
 Occupational Therapy Treatment Patient Details Name: Terry [name] MRN: [id number] DOB: 1951-11-29 Today's Date: 08/22/2024   History of present illness Pt is a 73 y.o. male presented with new onset of right-sided weakness and fall. MRI this AM confirmed lacunar infarct and stat CT completed around 1230pm with evidence of mild extension of the left deep white and deep gray matter lacunar infarct. Cleared by neuro for participation with evaluation. 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 He was moderately fatigued after working with PT, but is highly motivated to improve. He was agreeable to chair level session focusing on neuro-re education to maximize tone of RUE. He denies pain. Wife present for session and exercises were demonstrated to her. These included PROM and AAROM for shoulder elevation, retraction and protraction, shoulder flexion/punches and RUE weightbearing through elbow on recliner arm. Pt performed PROM to R elbow using LUE to assist. He also performed 5 reps of modified chair sit ups to maximize core strength/activation. Continued to encourage pt and spouse to work on RUE and RLE ROM throughout the day. He was left seated in recliner with all needs in place and will cont to require skilled acute OT services to maximize his safety and IND to return to PLOF.       If plan is discharge home, recommend the following:  A lot of help with bathing/dressing/bathroom;Two people to help with walking and/or transfers;Help with stairs or ramp for entrance;Assistance with cooking/housework   Equipment Recommendations  Other (comment) (defer)    Recommendations for Other Services Rehab consult    Precautions / Restrictions Precautions Precautions: Fall Recall of  Precautions/Restrictions: Intact Precaution/Restrictions Comments: R hemiplegia Restrictions Weight Bearing Restrictions Per Provider Order: No       Mobility Bed Mobility               General bed mobility comments: NT up in recliner following PT session    Transfers                   General transfer comment: deferred as pt was fatigued after PT session     Balance Overall balance assessment: Needs assistance Sitting-balance support: No upper extremity supported, Feet supported Sitting balance-Leahy Scale: Fair Sitting balance - Comments: modified sit ups in recliner using LUE to pull on recliner rail                                   ADL either performed or assessed with clinical judgement   ADL Overall ADL's : Needs assistance/impaired                                       General ADL Comments: session focused on neuro-re-ed PROM to RUE in all planes to promote improve tone    Extremity/Trunk Assessment Upper Extremity Assessment RUE Deficits / Details: trace activation in supine of tricep, abductor pollis brevis, scapular protraction/retraction            Vision       Perception     Praxis     Communication Communication Communication: Impaired Factors Affecting Communication:  Reduced clarity of speech (mild dysarthria due to R facial droop , however improved from initial evaluation)   Cognition Arousal: Alert Behavior During Therapy: WFL for tasks assessed/performed                                 Following commands: Intact        Cueing   Cueing Techniques: Verbal cues, Tactile cues  Exercises Other Exercises Other Exercises: Pt/wife were educated on modified sit ups in recliner Other Exercises: Edu on shoulder shrugs/elevation, protraction/retraction, as well as weightbearing through RUE with LUE support at elbow and PROM of RUE provided for 10 reps x1-2 sets of each exercise.     Shoulder Instructions       General Comments      Pertinent Vitals/ Pain       Pain Assessment Pain Assessment: No/denies pain  Home Living                                          Prior Functioning/Environment              Frequency  Min 3X/week        Progress Toward Goals  OT Goals(current goals can now be found in the care plan section)  Progress towards OT goals: Progressing toward goals  Acute Rehab OT Goals Patient Stated Goal: get better OT Goal Formulation: With patient/family Time For Goal Achievement: 09/01/24 Potential to Achieve Goals: Fair  Plan      Co-evaluation                 AM-PAC OT 6 Clicks Daily Activity     Outcome Measure   Help from another person eating meals?: A Little Help from another person taking care of personal grooming?: A Lot Help from another person toileting, which includes using toliet, bedpan, or urinal?: A Lot Help from another person bathing (including washing, rinsing, drying)?: A Lot Help from another person to put on and taking off regular upper body clothing?: A Lot Help from another person to put on and taking off regular lower body clothing?: A Lot 6 Click Score: 13    End of Session    OT Visit Diagnosis: Other abnormalities of gait and mobility (R26.89);Unsteadiness on feet (R26.81);Muscle weakness (generalized) (M62.81);Hemiplegia and hemiparesis Hemiplegia - Right/Left: Right Hemiplegia - dominant/non-dominant: Dominant Hemiplegia - caused by: Cerebral infarction   Activity Tolerance Patient tolerated treatment well;Patient limited by fatigue   Patient Left with call bell/phone within reach;in chair;with chair alarm set   Nurse Communication Mobility status        Time: 8868-8845 OT Time Calculation (min): 23 min  Charges: OT General Charges $OT Visit: 1 Visit OT Treatments $Neuromuscular Re-education: 23-37 mins  Faizah Kandler, OTR/L  08/22/24, 1:30  PM   Elbert Spickler E Marni Franzoni 08/22/2024, 1:27 PM

## 2024-08-22 NOTE — TOC Progression Note (Signed)
 Transition of Care [facility]) - Progression Note    Patient Details  Name: Terry [name] MRN: [id number] Date of Birth: [birth year]  Transition of Care [facility]) CM/SW Contact  Dalia GORMAN Fuse, RN Phone Number: 08/22/2024, 2:32 PM  Clinical Narrative:     Remains inpatient awaiting insurance auth for CIR. TOC will continue to follow.                    Expected Discharge Plan and Services         Expected Discharge Date: 08/22/24                                     Social Drivers of Health (SDOH) Interventions SDOH Screenings   Food Insecurity: No Food Insecurity (08/18/2024)  Housing: Unknown (08/18/2024)  Transportation Needs: No Transportation Needs (08/18/2024)  Utilities: Not At Risk (08/18/2024)  Depression (PHQ2-9): Low Risk  (07/26/2020)  Recent Concern: Depression (PHQ2-9) - Medium Risk (05/23/2020)  Financial Resource Strain: Low Risk  (07/18/2024)   Received from [facility]  Social Connections: Socially Integrated (08/18/2024)  Tobacco Use: Medium Risk (08/18/2024)  Health Literacy: Low Risk  (12/17/2021)   Received from [facility]    Readmission Risk Interventions     No data to display

## 2024-08-23 DIAGNOSIS — N179 Acute kidney failure, unspecified: Secondary | ICD-10-CM | POA: Diagnosis not present

## 2024-08-23 DIAGNOSIS — I480 Paroxysmal atrial fibrillation: Secondary | ICD-10-CM

## 2024-08-23 DIAGNOSIS — I341 Nonrheumatic mitral (valve) prolapse: Secondary | ICD-10-CM

## 2024-08-23 DIAGNOSIS — I639 Cerebral infarction, unspecified: Secondary | ICD-10-CM | POA: Diagnosis not present

## 2024-08-23 LAB — BASIC METABOLIC PANEL WITH GFR
Anion gap: 9 (ref 5–15)
BUN: 36 mg/dL — ABNORMAL HIGH (ref 8–23)
CO2: 26 mmol/L (ref 22–32)
Calcium: 9.5 mg/dL (ref 8.9–10.3)
Chloride: 105 mmol/L (ref 98–111)
Creatinine, Ser: 1.28 mg/dL — ABNORMAL HIGH (ref 0.61–1.24)
GFR, Estimated: 59 mL/min — ABNORMAL LOW (ref >60)
Glucose, Bld: 106 mg/dL — ABNORMAL HIGH (ref 70–99)
Potassium: 4 mmol/L (ref 3.5–5.1)
Sodium: 140 mmol/L (ref 135–145)

## 2024-08-23 LAB — CBC
HCT: 44 % (ref 39.0–52.0)
Hemoglobin: 15.2 g/dL (ref 13.0–17.0)
MCH: 34.2 pg — ABNORMAL HIGH (ref 26.0–34.0)
MCHC: 34.5 g/dL (ref 30.0–36.0)
MCV: 98.9 fL (ref 80.0–100.0)
Platelets: 251 K/uL (ref 150–400)
RBC: 4.45 MIL/uL (ref 4.22–5.81)
RDW: 12.6 % (ref 11.5–15.5)
WBC: 7.8 K/uL (ref 4.0–10.5)
nRBC: 0 % (ref 0.0–0.2)

## 2024-08-23 NOTE — Progress Notes (Signed)
 Occupational Therapy Treatment Patient Details Name: Terry [name] MRN: [id number] DOB: 20-Nov-1951 Today's Date: 08/23/2024   History of present illness Pt is a 73 y.o. male presented with new onset of right-sided weakness and fall. MRI this AM confirmed lacunar infarct and stat CT completed around 1230pm with evidence of mild extension of the left deep white and deep gray matter lacunar infarct. Cleared by neuro for participation with evaluation. 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 NT does report that pt assisted as much as he could with ADL task as educated by therapists last session. He is agreeable to PT/OT co-tx session to maximize pt/therapist safety. He denies pain. Pt performed bed mobility to reach EOB with Mod A for trunkal elevation this date and assist with RUE management as he is able to lift RLE with LLE to bring it to the edge. Session focused on transfer training. He was educated verbally and demonstrated both squat pivot and lateral scoot transfers prior to attempts. He required Mod A for squat pivot to the strong L side and SBA from second therapist to maximize safety. Pt then demo lateral scoot transfers to both strong and weak side. He needed Min A when transferring to the L side and Mod A when transferring to the weaker R side. Pt is highly motivated to improve his function and independence. He was re-educated to continue to perform his RUE/RLE HEPs to maximize tone.  Pt returned to recliner with all needs in place and will cont to require skilled acute OT services to maximize his safety and IND to return to PLOF.       If plan is discharge home, recommend the following:  A lot of help with bathing/dressing/bathroom;Two people to help with  walking and/or transfers;Help with stairs or ramp for entrance;Assistance with cooking/housework   Equipment Recommendations  Other (comment);Hospital bed (defer, would need drop arm BSC)    Recommendations for Other Services      Precautions / Restrictions Precautions Precautions: Fall Recall of Precautions/Restrictions: Intact Precaution/Restrictions Comments: R hemiplegia       Mobility Bed Mobility Overal bed mobility: Needs Assistance Bed Mobility: Supine to Sit     Supine to sit: Mod assist     General bed mobility comments: trunak elevation assist, pt able to hook RLE using LLE to bring it all the way to EOB    Transfers Overall transfer level: Needs assistance   Transfers: Bed to chair/wheelchair/BSC     Squat pivot transfers: Mod assist      Lateral/Scoot Transfers: Mod assist, Min assist General transfer comment: session focused on transfer training via squat pivot and lateral scoots; required Mod A x1 and SBA x1 for safety with squat pivot to recliner; pt then able to to lateral scoot towards strong L side with Min Ax1 from recliner to bed and back from bed to recliner towards weak R side with Mod A x1     Balance Overall balance assessment: Needs assistance Sitting-balance support: No upper extremity supported, Feet supported Sitting balance-Leahy Scale: Fair  ADL either performed or assessed with clinical judgement   ADL Overall ADL's : Needs assistance/impaired                                       General ADL Comments: pt had just finished bathing with nurse tech and NT reports pt did assist with bathing as much as he could    Extremity/Trunk Assessment              Vision       Perception     Praxis     Communication Communication Communication: Impaired Factors Affecting Communication: Reduced clarity of speech (mild dysarthria due to R facial droop , however  improved from initial evaluation)   Cognition Arousal: Alert Behavior During Therapy: WFL for tasks assessed/performed                                 Following commands: Intact        Cueing   Cueing Techniques: Verbal cues, Tactile cues  Exercises Other Exercises Other Exercises: Re-edu on importance of daily performance of RUE/RLE exercises to continue maximizing tone.    Shoulder Instructions       General Comments      Pertinent Vitals/ Pain       Pain Assessment Pain Assessment: No/denies pain  Home Living                                          Prior Functioning/Environment              Frequency  Min 3X/week        Progress Toward Goals  OT Goals(current goals can now be found in the care plan section)  Progress towards OT goals: Progressing toward goals  Acute Rehab OT Goals Patient Stated Goal: get better OT Goal Formulation: With patient/family Time For Goal Achievement: 09/01/24 Potential to Achieve Goals: Fair  Plan      Co-evaluation    PT/OT/SLP Co-Evaluation/Treatment: Yes Reason for Co-Treatment: Complexity of the patient's impairments (multi-system involvement);For patient/therapist safety;To address functional/ADL transfers PT goals addressed during session: Mobility/safety with mobility OT goals addressed during session: Strengthening/ROM      AM-PAC OT 6 Clicks Daily Activity     Outcome Measure   Help from another person eating meals?: A Little Help from another person taking care of personal grooming?: A Lot Help from another person toileting, which includes using toliet, bedpan, or urinal?: A Lot Help from another person bathing (including washing, rinsing, drying)?: A Lot Help from another person to put on and taking off regular upper body clothing?: A Lot Help from another person to put on and taking off regular lower body clothing?: A Lot 6 Click Score: 13    End of Session  Equipment Utilized During Treatment: Gait belt  OT Visit Diagnosis: Other abnormalities of gait and mobility (R26.89);Unsteadiness on feet (R26.81);Muscle weakness (generalized) (M62.81);Hemiplegia and hemiparesis Hemiplegia - Right/Left: Right Hemiplegia - dominant/non-dominant: Dominant Hemiplegia - caused by: Cerebral infarction   Activity Tolerance Patient tolerated treatment well   Patient Left with call bell/phone within reach;in chair;with chair alarm set;with family/visitor present   Nurse Communication Mobility status        Time: 0926-1000 OT Time  Calculation (min): 34 min  Charges: OT General Charges $OT Visit: 1 Visit OT Treatments $Therapeutic Activity: 8-22 mins  Larkyn Greenberger, OTR/L  08/23/24, 1:06 PM   Larisha Vencill E Gentri Guardado 08/23/2024, 1:01 PM

## 2024-08-23 NOTE — Progress Notes (Signed)
 Per Dr Maree, dc [facility]/stroke orders

## 2024-08-23 NOTE — TOC Progression Note (Signed)
 Transition of Care [facility]) - Progression Note    Patient Details  Name: Terry [name] MRN: [id number] Date of Birth: 05/26/1951  Transition of Care [facility] CM/SW Contact  Dalia GORMAN Fuse, RN Phone Number: 08/23/2024, 4:11 PM  Clinical Narrative:     Ins auth for CIR is denied HTA noted that he is more appropriate for SNF. FL2 created and sent out in [place] The patient's wife will be in house tomorrow at 10:00 AM. TOC will meet with her and the patient in the room to discuss bed offers.   [facility] MUST is down, PASRR is still needed.                    Expected Discharge Plan and Services         Expected Discharge Date: 08/22/24                                     Social Drivers of Health (SDOH) Interventions SDOH Screenings   Food Insecurity: No Food Insecurity (08/18/2024)  Housing: Unknown (08/18/2024)  Transportation Needs: No Transportation Needs (08/18/2024)  Utilities: Not At Risk (08/18/2024)  Depression (PHQ2-9): Low Risk  (07/26/2020)  Recent Concern: Depression (PHQ2-9) - Medium Risk (05/23/2020)  Financial Resource Strain: Low Risk  (07/18/2024)   Received from [facility]  Social Connections: Socially Integrated (08/18/2024)  Tobacco Use: Medium Risk (08/18/2024)  Health Literacy: Low Risk  (12/17/2021)   Received from [facility]    Readmission Risk Interventions     No data to display

## 2024-08-23 NOTE — Progress Notes (Signed)
 Physical Therapy Treatment Patient Details Name: Terry BEEKS MRN: [id number] DOB: 07/16/1951 Today's Date: 08/23/2024   History of Present Illness Pt is a 73 y.o. male presented with new onset of right-sided weakness and fall. MRI this AM confirmed lacunar infarct and stat CT completed around 1230pm with evidence of mild extension of the left deep white and deep gray matter lacunar infarct. Cleared by neuro for participation with evaluation. 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 Instructed patient on squat pivot with +1 assistance and transferring of hands on arm of chair to assist. Able to complete with modA towards the L. Lateral scoot transfer towards L side (strong side) required minA with cues for hand placement and technique. Educated patient on head hips relationship as well as demonstrated for patient, patient able to demonstrate understanding with cueing. Required modA to laterally scoot towards R (weaker side) despite going from higher to lower surface. Will trial transferring to/from w/c next session. Discharge plan remains appropriate.     If plan is discharge home, recommend the following: Two people to help with walking and/or transfers;Two people to help with bathing/dressing/bathroom   Can travel by Doctor, hospital (measurements PT);Wheelchair cushion (measurements PT);Hospital bed;BSC/3in1    Recommendations for Other Services       Precautions / Restrictions Precautions Precautions: Fall Recall of Precautions/Restrictions: Intact Precaution/Restrictions Comments: R hemiplegia     Mobility  Bed  Mobility Overal bed mobility: Needs Assistance Bed Mobility: Supine to Sit     Supine to sit: Mod assist     General bed mobility comments: cues for hooking LLE under RLE to assist with advancing RLE. Assist for trunk elevation    Transfers Overall transfer level: Needs assistance   Transfers: Bed to chair/wheelchair/BSC       Squat pivot transfers: Mod assist    Lateral/Scoot Transfers: Min assist, Mod assist General transfer comment: minA to lateral scoot towards L side and modA to scoot towards R side    Ambulation/Gait                   Stairs             Wheelchair Mobility     Tilt Bed    Modified Rankin (Stroke Patients Only)       Balance Overall balance assessment: Needs assistance Sitting-balance support: No upper extremity supported, Feet supported Sitting balance-Leahy Scale: Fair                                      Communication Communication Communication: Impaired Factors Affecting Communication: Other (comment);Reduced clarity of speech (R facial droop which seems to be improvign)  Cognition Arousal: Alert Behavior During Therapy: WFL for tasks assessed/performed   PT - Cognitive impairments: No apparent impairments                         Following commands: Intact      Cueing Cueing Techniques: Verbal cues, Tactile cues  Exercises  General Comments        Pertinent Vitals/Pain Pain Assessment Pain Assessment: No/denies pain    Home Living                          Prior Function            PT Goals (current goals can now be found in the care plan section) Acute Rehab PT Goals PT Goal Formulation: With patient Time For Goal Achievement: 09/01/24 Potential to Achieve Goals: Good Progress towards PT goals: Progressing toward goals    Frequency    Min 3X/week      PT Plan      Co-evaluation PT/OT/SLP Co-Evaluation/Treatment: Yes Reason for Co-Treatment:  Complexity of the patient's impairments (multi-system involvement);For patient/therapist safety;To address functional/ADL transfers PT goals addressed during session: Mobility/safety with mobility OT goals addressed during session: Strengthening/ROM      AM-PAC PT 6 Clicks Mobility   Outcome Measure  Help needed turning from your back to your side while in a flat bed without using bedrails?: A Lot Help needed moving from lying on your back to sitting on the side of a flat bed without using bedrails?: A Lot Help needed moving to and from a bed to a chair (including a wheelchair)?: A Lot Help needed standing up from a chair using your arms (e.g., wheelchair or bedside chair)?: A Lot Help needed to walk in hospital room?: Total Help needed climbing 3-5 steps with a railing? : Total 6 Click Score: 10    End of Session Equipment Utilized During Treatment: Gait belt Activity Tolerance: Patient tolerated treatment well Patient left: in chair;with call bell/phone within reach;with chair alarm set;with family/visitor present Nurse Communication: Mobility status PT Visit Diagnosis: Hemiplegia and hemiparesis Hemiplegia - Right/Left: Right Hemiplegia - dominant/non-dominant: Dominant Hemiplegia - caused by: Cerebral infarction     Time: 0928-1002 PT Time Calculation (min) (ACUTE ONLY): 34 min  Charges:    $Therapeutic Activity: 8-22 mins PT General Charges $$ ACUTE PT VISIT: 1 Visit                     Maryanne Finder, PT, DPT Physical Therapist - [facility]  [facility]    Montina Dorrance A Jood Retana 08/23/2024, 1:13 PM

## 2024-08-23 NOTE — [place] FL2 (Signed)
 [place]  MEDICAID FL2 LEVEL OF CARE FORM     IDENTIFICATION  Patient Name: Terry [name] Birthdate: 1951-11-22 Sex: male Admission Date (Current Location): 08/18/2024  [facility] and [place] Number:  Chiropodist and Address:  [facility], [address] [place], [place] [address]      Provider Number: [id number]  Attending Physician Name and Address:  Maree Hue, MD  Relative Name and Phone Number:  Opie, Maclaughlin (Spouse)  [phone]    Current Level of Care: Hospital Recommended Level of Care: Skilled Nursing Facility Prior Approval Number:    Date Approved/Denied:   PASRR Number:    Discharge Plan: SNF    Current Diagnoses: Patient Active Problem List   Diagnosis Date Noted   CVA (cerebral vascular accident) (HCC) 08/18/2024   Chronic anticoagulation 05/11/2020   Acute CVA (cerebrovascular accident) (HCC) 05/08/2020   Hypercholesteremia    Hypertension    Stroke [facility])    History of mitral valve replacement    Leg pain 09/27/2017   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   Venous insufficiency 09/27/2017   GERD (gastroesophageal reflux disease) 09/27/2017    Orientation RESPIRATION BLADDER Height & Weight     Self, Time, Situation, Place    Continent Weight: 82 kg Height:  5' 7 (170.2 cm)  BEHAVIORAL SYMPTOMS/MOOD NEUROLOGICAL BOWEL NUTRITION STATUS      Continent Diet  AMBULATORY STATUS COMMUNICATION OF NEEDS Skin   Limited Assist Verbally                         Personal Care Assistance Level of Assistance  Bathing, Feeding, Dressing Bathing Assistance: Limited assistance Feeding assistance: Limited assistance Dressing Assistance: Limited assistance     Functional Limitations Info  Speech, Sight, Hearing Sight Info: Adequate Hearing Info: Adequate Speech Info: Adequate    SPECIAL CARE FACTORS FREQUENCY                       Contractures Contractures Info: Present    Additional Factors Info   Allergies, Code Status Code Status Info: full Allergies Info: Penicillins, Cipro, IV Contrast Dye           Current Medications (08/23/2024):  This is the current hospital active medication list Current Facility-Administered Medications  Medication Dose Route Frequency Provider Last Rate Last Admin   acetaminophen  (TYLENOL ) tablet 650 mg  650 mg Oral Q4H PRN Laurita Manor T, MD   650 mg at 08/20/24 2137   Or   acetaminophen  (TYLENOL ) 160 MG/5ML solution 650 mg  650 mg Per Tube Q4H PRN Laurita Manor T, MD       Or   acetaminophen  (TYLENOL ) suppository 650 mg  650 mg Rectal Q4H PRN Laurita Manor DASEN, MD       aspirin  EC tablet 81 mg  81 mg Oral Daily Laurita Manor T, MD   81 mg at 08/23/24 9187   atorvastatin  (LIPITOR) tablet 80 mg  80 mg Oral QHS Laurita Manor T, MD   80 mg at 08/22/24 2122   dabigatran  (PRADAXA ) capsule 150 mg  150 mg Oral Q12H Arora, Ashish, MD   150 mg at 08/23/24 9187   melatonin tablet 5 mg  5 mg Oral QHS Zhang, Ping T, MD   5 mg at 08/22/24 2122   pantoprazole  (PROTONIX ) EC tablet 40 mg  40 mg Oral Daily Laurita Manor T, MD   40 mg at 08/23/24 9187   senna-docusate (Senokot-S)  tablet 1 tablet  1 tablet Oral QHS PRN Laurita Cort DASEN, MD         Discharge Medications: Please see discharge summary for a list of discharge medications.  Relevant Imaging Results:  Relevant Lab Results:   Additional Information SSN [id number]  Dalia GORMAN Fuse, RN

## 2024-08-23 NOTE — Progress Notes (Signed)
 PROGRESS NOTE   HPI was taken from Dr. Laurita:  Terry [name] is a 73 y.o. 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   Patient started to feel mild weakness on the right hand yesterday  right arm appears to be sleeping he brought attributed to possible pressure palsy.  But this morning, patient woke up after midnight around 230 and this time he developed both significant weakness of the right arm and right leg.  Patient was last known normal around 9 PM yesterday.  He takes Eliquis  for A-fib for last 6 years and has had 1 stroke in 2022 and 2 mini strokes in 2023 and 2024.   ED Course: Afebrile, no tachycardia no hypotension.  MRI showed right lacunar stroke in the right corona radiata.  CTA negative for LVO.  CTs of the tissue of neck negative for mass or infection or inflammation.  Blood work showed creatinine 1.5 BUN 16 glucose 122 hemoglobin 13.7 WBC 7.8.  8/27: CIR denied.  TOC working on SNF placement   LANDON BASSFORD  FMW:[id number] DOB: 1951-11-15 DOA: 08/18/2024 PCP: [name], MD  Assessment & Plan:   Active Problems:   Acute CVA (cerebrovascular accident) [facility])   CVA (cerebral vascular accident) ([facility])  Assessment and Plan: CVA: w/ right sided paresis. Continue on aspirin  & pradaxa  (pradaxa  was started this admission by neuro). Hx of previous CVA w/ residual aphasia. PT/OT recs CIR. Pt agrees to CIR. insurance denied CIR.  TOC working on SNF   AKI: w/ mild left hydronephrosis as per renal US . Cr is trending down from day prior    PAF: continue on pradaxa  as per neuro. Pradaxa  is not covered by pt's insurance but pt can get a 3 month  supply of pradaxa  for approx $90 at [facility] which is online discount pharmacy and the e-script needs to be sent and include the pt's email  address: [email or web address]    Hx of mitral prolapse: s/p bovine mitral valve replacement. Echo shows EF 40-45%, LV global hypokinesis, grade I diastolic dysfunction, mild MR, mod-severe MS, mod AS. Will need to f/u outpatient w/ cardio      DVT prophylaxis: pradaxa  Code Status: full  Family Communication: Disposition Plan: possibly d/c to SNF.    Level of care: Med-Surg  Status is: Inpatient Remains inpatient appropriate because: medically stable.  Insurance denied CIR, waiting for SNF    Consultants:  Neuro     Subjective:  He is still not being able to move right arm & right leg.  Hopeful to go CIR  Objective: Vitals:   08/23/24 0350 08/23/24 0750 08/23/24 1149 08/23/24 1559  BP: 91/68 110/87 103/79 114/69  Pulse: 93 60 82 (!) 42  Resp: 14 16 16 16   Temp: 98.2 F (36.8 C) 98.1 F (36.7 C) 97.8 F (36.6 C) 98.4 F (36.9 C)  TempSrc: Oral Oral Oral Oral  SpO2: 97% 90% 95% 96%  Weight:      Height:        Intake/Output Summary (Last 24 hours) at 08/23/2024 1627 Last data filed at 08/23/2024 1300 Gross per 24 hour  Intake 480 ml  Output --  Net 480 ml   Filed Weights   08/18/24 0321  Weight: 82 kg    Examination:  General exam: appears comfortable  Respiratory system: clear  breath sounds b/l  Cardiovascular system: S1/S2+. No rubs or clicks  Gastrointestinal system: abd is soft, NT, ND & hypoactive bowel sounds  Central nervous system: alert & oriented. Unable to move right arm & right leg  Psychiatry: judgement and insight appears at baseline. Appropriate mood and affect    Data Reviewed: I have personally reviewed following labs and imaging studies  CBC: Recent Labs  Lab 08/18/24 0321 08/20/24 0603 08/21/24 0406 08/22/24 0428 08/23/24 0411  WBC 7.8 8.1 6.8 7.3 7.8  NEUTROABS 5.9  --   --   --   --   HGB 13.7 14.8 15.1 15.5 15.2  HCT 39.3 41.5 43.9 44.4 44.0  MCV 99.2 96.5 98.4 98.7 98.9  PLT 176 204 224 231 251   Basic Metabolic  Panel: Recent Labs  Lab 08/18/24 0341 08/19/24 0614 08/20/24 0603 08/21/24 0406 08/22/24 0428 08/23/24 0411  NA  --  139 139 139 141 140  K  --  4.5 3.7 3.6 3.9 4.0  CL  --  108 108 103 106 105  CO2  --  21* 22 26 26 26   GLUCOSE  --  100* 113* 105* 104* 106*  BUN  --  23 23 27* 34* 36*  CREATININE  --  1.23 1.16 1.25* 1.20 1.28*  CALCIUM   --  9.4 9.6 9.8 9.7 9.5  MG 1.9  --   --   --   --   --    GFR: Estimated Creatinine Clearance: 51.9 mL/min (A) (by C-G formula based on SCr of 1.28 mg/dL (H)). Liver Function Tests: Recent Labs  Lab 08/18/24 0321  AST 24  ALT 20  ALKPHOS 80  BILITOT 0.8  PROT 6.8  ALBUMIN 3.9   No results for input(s): LIPASE, AMYLASE in the last 168 hours. No results for input(s): AMMONIA in the last 168 hours. Coagulation Profile: Recent Labs  Lab 08/18/24 0321  INR 1.3*     Recent Results (from the past 240 hours)  Group A Strep by PCR ([facility] Only)     Status: None   Collection Time: 08/18/24  3:41 AM   Specimen: Throat; Sterile Swab  Result Value Ref Range Status   Group A Strep by PCR NOT DETECTED NOT DETECTED Final    Comment: Performed at [facility], [address] [place], [place] [address]  Resp panel by RT-PCR (RSV, Flu A&B, Covid) Throat     Status: None   Collection Time: 08/18/24  3:41 AM   Specimen: Throat; Nasal Swab  Result Value Ref Range Status   SARS Coronavirus 2 by RT PCR NEGATIVE NEGATIVE Final    Comment: (NOTE) SARS-CoV-2 target nucleic acids are NOT DETECTED.  The SARS-CoV-2 RNA is generally detectable in upper respiratory specimens during the acute phase of infection. The lowest concentration of SARS-CoV-2 viral copies this assay can detect is 138 copies/mL. A negative result does not preclude SARS-Cov-2 infection and should not be used as the sole basis for treatment or other patient management decisions. A negative result may occur with  improper specimen collection/handling, submission of  specimen other than nasopharyngeal swab, presence of viral mutation(s) within the areas targeted by this assay, and inadequate number of viral copies(<138 copies/mL). A negative result must be combined with clinical observations, patient history, and epidemiological information. The expected result is Negative.  Fact Sheet for Patients:  BloggerCourse.com  Fact Sheet for Healthcare Providers:  SeriousBroker.it  This test is no t yet approved or cleared by the [place]  [place] FDA and  has been authorized for detection and/or diagnosis of SARS-CoV-2 by FDA under an Emergency Use Authorization (EUA). This EUA will remain  in effect (meaning this test can be used) for the duration of the COVID-19 declaration under Section 564(b)(1) of the Act, 21 U.S.C.section 360bbb-3(b)(1), unless the authorization is terminated  or revoked sooner.       Influenza A by PCR NEGATIVE NEGATIVE Final   Influenza B by PCR NEGATIVE NEGATIVE Final    Comment: (NOTE) The Xpert Xpress SARS-CoV-2/FLU/RSV plus assay is intended as an aid in the diagnosis of influenza from Nasopharyngeal swab specimens and should not be used as a sole basis for treatment. Nasal washings and aspirates are unacceptable for Xpert Xpress SARS-CoV-2/FLU/RSV testing.  Fact Sheet for Patients: BloggerCourse.com  Fact Sheet for Healthcare Providers: SeriousBroker.it  This test is not yet approved or cleared by the [place]  FDA and has been authorized for detection and/or diagnosis of SARS-CoV-2 by FDA under an Emergency Use Authorization (EUA). This EUA will remain in effect (meaning this test can be used) for the duration of the COVID-19 declaration under Section 564(b)(1) of the Act, 21 U.S.C. section 360bbb-3(b)(1), unless the authorization is terminated or revoked.     Resp Syncytial Virus by PCR NEGATIVE NEGATIVE Final     Comment: (NOTE) Fact Sheet for Patients: BloggerCourse.com  Fact Sheet for Healthcare Providers: SeriousBroker.it  This test is not yet approved or cleared by the [place]  FDA and has been authorized for detection and/or diagnosis of SARS-CoV-2 by FDA under an Emergency Use Authorization (EUA). This EUA will remain in effect (meaning this test can be used) for the duration of the COVID-19 declaration under Section 564(b)(1) of the Act, 21 U.S.C. section 360bbb-3(b)(1), unless the authorization is terminated or revoked.  Performed at [facility], [address] [place], [place] [address]   Hsv Culture And Typing     Status: None   Collection Time: 08/18/24  4:00 AM  Result Value Ref Range Status   HSV Culture/Type Comment  Final    Comment: (NOTE) Negative No Herpes simplex virus isolated. Performed At: [facility] [address] [place], [place] [id number] Jennette Shorter MD Ey:[id number]    Source of Sample THROAT  Final    Comment: Performed at [facility], [address] [place], [place] [address]        Scheduled Meds:  aspirin  EC  81 mg Oral Daily   atorvastatin   80 mg Oral QHS   dabigatran   150 mg Oral Q12H   melatonin  5 mg Oral QHS   pantoprazole   40 mg Oral Daily   Continuous Infusions:   LOS: 4 days   Time spent 35 minutes  Cresencio Fairly, MD [facility] Pager [phone]  If 7PM-7AM, please contact night-coverage [email or web address] 08/23/2024, 4:27 PM

## 2024-08-23 NOTE — Progress Notes (Addendum)
   Inpatient Rehabilitation Admissions Coordinator   Dr Lorilee completed a peer to peer with Channing, NP, at [facility]. They have denied request for CIR admit. [facility] feels that patient unable to tolerate the intensity of CIR level rehab as he is non ambulatory and feel that he does not meet the medical need for hospital based rehabilitation. They recommend SNF level rehab. I spoke with patient by phone to explain denial. I have left a voicemail for his wife who has gone home to take a nap. I will explain to her the denial when I make contact. Acute team and TOC made aware.  Heron Leavell, RN, MSN Rehab Admissions Coordinator [phone] 08/23/2024 2:16 PM  I spoke to his wife by phone and she is aware of denial . She would like to  pursue SNF. We will sign off.  Heron Leavell, RN, MSN Rehab Admissions Coordinator [phone] 08/23/2024 2:58 PM

## 2024-08-23 NOTE — Progress Notes (Signed)
   Inpatient Rehabilitation Admissions Coordinator   Rehab MD to do peer to peer with [employer] , Channing, NP, today.  Heron Leavell, RN, MSN Rehab Admissions Coordinator [phone] 08/23/2024 8:21 AM

## 2024-08-24 ENCOUNTER — Other Ambulatory Visit (HOSPITAL_COMMUNITY): Payer: Self-pay

## 2024-08-24 ENCOUNTER — Other Ambulatory Visit: Payer: Self-pay

## 2024-08-24 DIAGNOSIS — I480 Paroxysmal atrial fibrillation: Secondary | ICD-10-CM | POA: Diagnosis not present

## 2024-08-24 DIAGNOSIS — I341 Nonrheumatic mitral (valve) prolapse: Secondary | ICD-10-CM | POA: Diagnosis not present

## 2024-08-24 DIAGNOSIS — N179 Acute kidney failure, unspecified: Secondary | ICD-10-CM | POA: Diagnosis not present

## 2024-08-24 DIAGNOSIS — I639 Cerebral infarction, unspecified: Secondary | ICD-10-CM | POA: Diagnosis not present

## 2024-08-24 LAB — BASIC METABOLIC PANEL WITH GFR
Anion gap: 8 (ref 5–15)
BUN: 34 mg/dL — ABNORMAL HIGH (ref 8–23)
CO2: 25 mmol/L (ref 22–32)
Calcium: 9.3 mg/dL (ref 8.9–10.3)
Chloride: 104 mmol/L (ref 98–111)
Creatinine, Ser: 1.24 mg/dL (ref 0.61–1.24)
GFR, Estimated: >60 mL/min (ref >60)
Glucose, Bld: 113 mg/dL — ABNORMAL HIGH (ref 70–99)
Potassium: 3.8 mmol/L (ref 3.5–5.1)
Sodium: 137 mmol/L (ref 135–145)

## 2024-08-24 LAB — CBC
HCT: 42.2 % (ref 39.0–52.0)
Hemoglobin: 14.8 g/dL (ref 13.0–17.0)
MCH: 33.9 pg (ref 26.0–34.0)
MCHC: 35.1 g/dL (ref 30.0–36.0)
MCV: 96.8 fL (ref 80.0–100.0)
Platelets: 230 K/uL (ref 150–400)
RBC: 4.36 MIL/uL (ref 4.22–5.81)
RDW: 12.6 % (ref 11.5–15.5)
WBC: 7.7 K/uL (ref 4.0–10.5)
nRBC: 0 % (ref 0.0–0.2)

## 2024-08-24 NOTE — Discharge Summary (Signed)
 Physician Discharge Summary   Patient: Terry [name] MRN: [id number] DOB: Aug 03, 1951  Admit date:     08/18/2024  Discharge date: 08/26/24  Discharge Physician: Cresencio Fairly   PCP: [name], MD   Recommendations at discharge:   Follow-up with outpatient providers as requested  Discharge Diagnoses: Active Problems:   Acute CVA (cerebrovascular accident) (HCC)   CVA (cerebral vascular accident) (HCC)   AKI (acute kidney injury) (HCC)   Paroxysmal atrial fibrillation (HCC)   Mitral valve prolapse  Hospital Course: Assessment and Plan:  73 y.o. 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   Patient started to feel mild weakness on the right hand yesterday  right arm appears to be sleeping he brought attributed to possible pressure palsy.  But this morning, patient woke up after midnight around 230 and this time he developed both significant weakness of the right arm and right leg.  Patient was last known normal around 9 PM yesterday.  He takes Eliquis  for A-fib for last 6 years and has had 1 stroke in 2022 and 2 mini strokes in 2023 and 2024.   ED Course: Afebrile, no tachycardia no hypotension.  MRI showed right lacunar stroke in the right corona radiata.  CTA negative for LVO.  CTs of the tissue of neck negative for mass or infection or inflammation.  Blood work showed creatinine 1.5 BUN 16 glucose 122 hemoglobin 13.7 WBC 7.8.   8/27: CIR denied.  TOC working on SNF placement 8/28: medically stable for DC. Waiting for insurance auth 8/29: Insurance auth came late, [facility] will take him tomorrow      Terry [name]   FMW:[id number] DOB: 1951-10-31 DOA: 08/18/2024 PCP: [name], MD   Assessment & Plan:   Active Problems:   Acute CVA (cerebrovascular accident) [facility])    CVA (cerebral vascular accident) (HCC)   Assessment and Plan: CVA: w/ right sided paresis. Continue on aspirin  & pradaxa  (pradaxa  was started this admission by neuro). Hx of previous CVA w/ residual aphasia. PT/OT recs CIR. Pt agrees to CIR. insurance denied CIR.  TOC working on SNF   AKI: w/ mild left hydronephrosis as per renal US . Cr is normalized   PAF: continue on pradaxa  as per neuro.   Hx of mitral prolapse: s/p bovine mitral valve replacement. Echo shows EF 40-45%, LV global hypokinesis, grade I diastolic dysfunction, mild MR, mod-severe MS, mod AS. Will need to f/u outpatient w/ cardio       Consultants: Neuro  Disposition: Skilled nursing facility Diet recommendation:  Carb modified diet DISCHARGE MEDICATION: Allergies as of 08/26/2024       Reactions   Penicillins Shortness Of Breath   Did it involve swelling of the face/tongue/throat, SOB, or low BP? Yes Did it involve sudden or severe rash/hives, skin peeling, or any reaction on the inside of your mouth or nose? Unknown Did you need to seek medical attention at a hospital or doctor's office? Was at a clinic when reaction occurred When did it last happen? More than 50 years ago If all above answers are "NO", may proceed with cephalosporin use.   Ciprofloxacin Swelling, Rash   Ivp Dye [iodinated Contrast Media] Rash        Medication List     STOP taking these medications    apixaban  5  MG Tabs tablet Commonly known as: ELIQUIS    donepezil 5 MG tablet Commonly known as: ARICEPT   sildenafil 100 MG tablet Commonly known as: VIAGRA       TAKE these medications    aspirin  EC 81 MG tablet Take 1 tablet (81 mg total) by mouth daily.   atorvastatin  80 MG tablet Commonly known as: Lipitor Take 1 tablet (80 mg total) by mouth daily.   clobetasol  ointment 0.05 % Commonly known as: TEMOVATE  Apply 1 application topically daily as needed (skin irritation).   dabigatran  150 MG Caps capsule Commonly known  as: PRADAXA  Take 1 capsule (150 mg total) by mouth every 12 (twelve) hours.   feeding supplement Liqd Take 237 mLs by mouth 2 (two) times daily between meals.   melatonin 5 MG Tabs Take 1 tablet (5 mg total) by mouth at bedtime.   multivitamin with minerals Tabs tablet Take 1 tablet by mouth at bedtime.   nortriptyline 50 MG capsule Commonly known as: PAMELOR Take 50 mg by mouth at bedtime.   omeprazole 20 MG capsule Commonly known as: PRILOSEC Take 20 mg by mouth every evening.   Prevagen 10 MG Caps Generic drug: Apoaequorin Take 1 capsule by mouth daily.   valACYclovir  500 MG tablet Commonly known as: VALTREX  Take 500 mg by mouth at bedtime.        Contact information for follow-up providers     [name], MD Follow up.   Specialty: Internal Medicine Why: hospital follow up Contact information: [address] [place] [address] [phone]              Contact information for after-discharge care     Destination     [facility]   Service: Skilled Nursing Contact information: [address] [place]  [address] [phone]                    Discharge Exam: Terry [name]   08/18/24 0321  Weight: 82 kg   General exam: appears comfortable  Respiratory system: clear breath sounds b/l  Cardiovascular system: S1/S2+. No rubs or clicks  Gastrointestinal system: abd is soft, benign Central nervous system: alert & oriented. Unable to move right arm & minimal movt of the right leg  Psychiatry: judgement and insight appears at baseline. Appropriate mood and affect  Condition at discharge: fair  The results of significant diagnostics from this hospitalization (including imaging, microbiology, ancillary and laboratory) are listed below for reference.   Imaging Studies: ECHOCARDIOGRAM COMPLETE Result Date: 08/18/2024    ECHOCARDIOGRAM REPORT   Patient Name:   Terry [name] Date of Exam:  08/18/2024 Medical Rec #:  [id number]    Height:       67.0 in Accession #:    [id number]   Weight:       180.8 lb Date of Birth:  08/06/1951   BSA:          1.937 m Patient Age:    72 years     BP:           120/70 mmHg Patient Gender: M            HR:           87 bpm. Exam Location:  [facility] Procedure: 2D Echo, Cardiac Doppler, Color Doppler and Intracardiac            Opacification Agent (Both Spectral and Color Flow Doppler were  utilized during procedure). Indications:     Stroke I63.9  History:         Patient has prior history of Echocardiogram examinations, most                  recent 04/02/2021. Stroke; Mitral Valve Disease.  Sonographer:     Rosina Dunk Referring Phys:  [id number] CORT ONEIDA MANA Diagnosing Phys: Cara JONETTA Lovelace MD  Sonographer Comments: Technically difficult study due to poor echo windows. IMPRESSIONS  1. TDS.  2. Left ventricular ejection fraction, by estimation, is 40 to 45%. The left ventricle has mildly decreased function. The left ventricle demonstrates global hypokinesis. The left ventricular internal cavity size was mildly dilated. There is moderate asymmetric left ventricular hypertrophy of the inferior segment. Left ventricular diastolic parameters are consistent with Grade I diastolic dysfunction (impaired relaxation).  3. Right ventricular systolic function is normal. The right ventricular size is normal.  4. The mitral valve has been repaired/replaced. Mild mitral valve regurgitation. Moderate to severe mitral stenosis.  5. The aortic valve is calcified. Aortic valve regurgitation is trivial. Moderate aortic valve stenosis. Conclusion(s)/Recommendation(s): Poor windows for evaluation of left ventricular function by transthoracic echocardiography. Would recommend an alternative means of evaluation. FINDINGS  Left Ventricle: Left ventricular ejection fraction, by estimation, is 40 to 45%. The left ventricle has mildly decreased function. The left ventricle  demonstrates global hypokinesis. Definity  contrast agent was given IV to delineate the left ventricular  endocardial borders. Strain was performed and the global longitudinal strain is indeterminate. The left ventricular internal cavity size was mildly dilated. There is moderate asymmetric left ventricular hypertrophy of the inferior segment. Left ventricular diastolic parameters are consistent with Grade I diastolic dysfunction (impaired relaxation). Right Ventricle: The right ventricular size is normal. No increase in right ventricular wall thickness. Right ventricular systolic function is normal. Left Atrium: Left atrial size was normal in size. Right Atrium: Right atrial size was normal in size. Pericardium: There is no evidence of pericardial effusion. Mitral Valve: The mitral valve has been repaired/replaced. Mild mitral valve regurgitation. There is a bioprosthetic valve present in the mitral position. Moderate to severe mitral valve stenosis. MV peak gradient, 21.7 mmHg. The mean mitral valve gradient is 12.0 mmHg. Tricuspid Valve: The tricuspid valve is normal in structure. Tricuspid valve regurgitation is trivial. Aortic Valve: The aortic valve is calcified. Aortic valve regurgitation is trivial. Moderate aortic stenosis is present. Aortic valve mean gradient measures 12.0 mmHg. Aortic valve peak gradient measures 22.3 mmHg. Aortic valve area, by VTI measures 1.02  cm. Pulmonic Valve: The pulmonic valve was grossly normal. Pulmonic valve regurgitation is trivial. Aorta: The ascending aorta was not well visualized. IAS/Shunts: No atrial level shunt detected by color flow Doppler. Additional Comments: TDS. 3D was performed not requiring image post processing on an independent workstation and was indeterminate.  LEFT VENTRICLE PLAX 2D LVIDd:         5.30 cm LVIDs:         4.80 cm LV PW:         1.40 cm LV IVS:        1.00 cm LVOT diam:     2.30 cm LV SV:         38 LV SV Index:   20 LVOT Area:     4.15 cm   RIGHT VENTRICLE RV S prime:     5.11 cm/s TAPSE (M-mode): 0.8 cm AORTIC VALVE  PULMONIC VALVE AV Area (Vmax):    0.99 cm      PV Vmax:        1.12 m/s AV Area (Vmean):   0.92 cm      PV Vmean:       78.300 cm/s AV Area (VTI):     1.02 cm      PV VTI:         0.196 m AV Vmax:           236.33 cm/s   PV Peak grad:   5.0 mmHg AV Vmean:          160.000 cm/s  PV Mean grad:   3.0 mmHg AV VTI:            0.376 m       RVOT Peak grad: 4 mmHg AV Peak Grad:      22.3 mmHg AV Mean Grad:      12.0 mmHg LVOT Vmax:         56.10 cm/s LVOT Vmean:        35.600 cm/s LVOT VTI:          0.092 m LVOT/AV VTI ratio: 0.25  AORTA Ao Root diam: 3.80 cm Ao Asc diam:  3.00 cm MITRAL VALVE                TRICUSPID VALVE MV Area (PHT): 4.60 cm     TR Peak grad:   21.9 mmHg MV Area VTI:   0.93 cm     TR Mean grad:   13.0 mmHg MV Peak grad:  21.7 mmHg    TR Vmax:        234.00 cm/s MV Mean grad:  12.0 mmHg    TR Vmean:       169.0 cm/s MV Vmax:       2.33 m/s MV Vmean:      172.0 cm/s   SHUNTS MV Decel Time: 165 msec     Systemic VTI:  0.09 m MV E velocity: 133.00 cm/s  Systemic Diam: 2.30 cm MV A velocity: 197.00 cm/s  Pulmonic VTI:  0.167 m MV E/A ratio:  0.68 Dwayne D Callwood MD Electronically signed by Cara JONETTA Lovelace MD Signature Date/Time: 08/18/2024/3:06:23 PM    Final    CT Head Wo Contrast Result Date: 08/18/2024 CLINICAL DATA:  73 year old male with fall and neurologic deficits. Left posterior corona radiata/basal ganglia lacunar infarct on MRI this morning. Worsening neurologic symptoms. EXAM: CT HEAD WITHOUT CONTRAST TECHNIQUE: Contiguous axial images were obtained from the base of the skull through the vertex without intravenous contrast. RADIATION DOSE REDUCTION: This exam was performed according to the departmental dose-optimization program which includes automated exposure control, adjustment of the mA and/or kV according to patient size and/or use of iterative reconstruction technique. COMPARISON:   Brain MRI 08/18/2024, head CT 0754 hours today. FINDINGS: Brain: Hypodensity posterior left corona radiata tracking to the lentiform which has progressed since 0343 hours today on CT (series 2, image 13), and probably is slightly larger since than the DWI abnormality at 0541 hours today (compare to series 5, image 27 at that time). No hemorrhagic transformation. No mass effect. Stable gray-white differentiation elsewhere including chronic left thalamic lacunar infarct, chronic left operculum infarct. No intracranial mass effect or ventriculomegaly. Vascular: Residual intravascular contrast from CTA this morning. Skull: Stable and intact. Sinuses/Orbits: Visualized paranasal sinuses and mastoids are stable and well aerated. Other: No acute orbit or scalp soft tissue finding. IMPRESSION: 1. Evidence  of mild extension of the left deep white and deep gray matter lacunar infarct since DWI MRI 0544 hours today. No hemorrhagic transformation or mass effect. 2. No new intracranial abnormality identified. Background chronic ischemic disease. Electronically Signed   By: VEAR Hurst M.D.   On: 08/18/2024 12:46   US  RENAL Result Date: 08/18/2024 CLINICAL DATA:  Acute renal injury EXAM: RENAL / URINARY TRACT ULTRASOUND COMPLETE COMPARISON:  None Available. FINDINGS: Right Kidney: Renal measurements: 9.9 x 4.2 x 5.1 = volume: 110 mL. Echogenicity within normal limits. No mass or hydronephrosis visualized. Left Kidney: Renal measurements: 10.2 x 5.7 x 5.4 = volume: 167 mL. Mild LEFT hydronephrosis. 2 cm anechoic cyst in lower pole. Bladder: Appears normal for degree of bladder distention. Bilateral ureteral jets identified. Other: None. IMPRESSION: 1. Mild LEFT hydronephrosis. 2. Benign LEFT renal cyst. 3. Normal RIGHT kidney. 4. Bilateral ureteral jets identified. Electronically Signed   By: Jackquline Boxer M.D.   On: 08/18/2024 11:16   CT Soft Tissue Neck W Contrast Result Date: 08/18/2024 CLINICAL DATA:  Anterior neck  swelling and throat pain EXAM: CT NECK WITH CONTRAST TECHNIQUE: Multidetector CT imaging of the neck was performed using the standard protocol following the bolus administration of intravenous contrast. RADIATION DOSE REDUCTION: This exam was performed according to the departmental dose-optimization program which includes automated exposure control, adjustment of the mA and/or kV according to patient size and/or use of iterative reconstruction technique. CONTRAST:  75mL OMNIPAQUE  IOHEXOL  350 MG/ML SOLN COMPARISON:  CT angiography same day FINDINGS: Pharynx and larynx: This examination suffers from some motion degradation. No evidence of mucosal or submucosal mass or inflammatory disease. Salivary glands: Parotid and submandibular glands are normal. Thyroid: Normal. Lymph nodes: No lymphadenopathy on either side of the neck. Normal cervical chain nodes. Vascular: Atherosclerotic calcification at the carotid bifurcations as evaluated by CT angiography. Limited intracranial: Negative Visualized orbits: Normal Mastoids and visualized paranasal sinuses: Clear Skeleton: Ordinary spondylosis and facet arthritis. Upper chest: Clear Other: No evidence of superficial soft tissue edema, inflammation or mass. No abnormality seen to explain the clinical concern. IMPRESSION: 1. No abnormality seen to explain the clinical concern. No evidence of mass or lymphadenopathy. 2. Atherosclerotic calcification at the carotid bifurcations as evaluated by CT angiography. 3. Ordinary spondylosis and facet arthritis of the cervical spine. Electronically Signed   By: Oneil Officer M.D.   On: 08/18/2024 08:20   CT ANGIO HEAD NECK W WO CM Result Date: 08/18/2024 CLINICAL DATA:  Neuro deficit, acute, stroke suspected. Anterior neck swelling and throat pain. Fall. Acute small vessel infarction of the left brain. EXAM: CT ANGIOGRAPHY HEAD AND NECK WITH AND WITHOUT CONTRAST TECHNIQUE: Multidetector CT imaging of the head and neck was performed  using the standard protocol during bolus administration of intravenous contrast. Multiplanar CT image reconstructions and MIPs were obtained to evaluate the vascular anatomy. Carotid stenosis measurements (when applicable) are obtained utilizing NASCET criteria, using the distal internal carotid diameter as the denominator. RADIATION DOSE REDUCTION: This exam was performed according to the departmental dose-optimization program which includes automated exposure control, adjustment of the mA and/or kV according to patient size and/or use of iterative reconstruction technique. CONTRAST:  75mL OMNIPAQUE  IOHEXOL  350 MG/ML SOLN COMPARISON:  Head CT and brain MRI earlier same day FINDINGS: CT HEAD FINDINGS Brain: No focal abnormality seen affecting the brainstem or cerebellum. Old infarction in the left frontal operculum. Acute infarction within the left external capsule and body of the caudate visible as subtle low density. No  evidence of hemorrhagic transformation or mass effect. No hydrocephalus. No extra-axial collection. Vascular: There is atherosclerotic calcification of the major vessels at the base of the brain. Skull: Negative Sinuses/Orbits: Clear/normal Other: None Review of the MIP images confirms the above findings CTA NECK FINDINGS Aortic arch: Aortic atherosclerosis. Branching pattern is normal without origin stenosis. Right carotid system: Common carotid artery widely patent to the bifurcation. Calcified plaque at the carotid bifurcation and ICA bulb but no stenosis when compared to the more distal cervical ICA diameter. Left carotid system: Common carotid artery widely patent to the bifurcation. Calcified plaque at the carotid bifurcation and ICA bulb but no stenosis. Cervical ICA widely patent beyond that Vertebral arteries: No proximal subclavian stenosis. Both vertebral artery origins are widely patent. Calcified plaque adjacent to the right vertebral artery origin. Both vertebral arteries are patent  through the cervical region without stenosis. Skeleton: Ordinary spondylosis C5-6. Mild facet osteoarthritis right worse than left. Other neck: See results of neck CT. Upper chest: Minimal emphysema in the upper lobes. Review of the MIP images confirms the above findings CTA HEAD FINDINGS Anterior circulation: Both internal carotid arteries are patent through the skull base and siphon regions. Mild siphon atherosclerotic calcification but no stenosis. The anterior and middle cerebral vessels are patent. No large vessel occlusion or proximal stenosis. Some motion degradation towards the vertex. Posterior circulation: Both vertebral arteries are patent through the foramen magnum to the basilar artery. No basilar stenosis. Posterior circulation branch vessels are patent. Venous sinuses: Patent and normal. Anatomic variants: None significant. Review of the MIP images confirms the above findings IMPRESSION: 1. No intracranial large vessel occlusion or proximal stenosis. 2. Acute infarction in the left external capsule and body of the caudate visible as subtle low density. No evidence of hemorrhagic transformation or mass effect. 3. Old infarction in the left frontal operculum. 4. Atherosclerotic calcification at the carotid bifurcations and ICA bulbs but no stenosis. Aortic Atherosclerosis (ICD10-I70.0). Electronically Signed   By: Oneil Officer M.D.   On: 08/18/2024 08:17   MR BRAIN WO CONTRAST Result Date: 08/18/2024 CLINICAL DATA:  73 year old male status post fall. Neurologic deficit. EXAM: MRI HEAD WITHOUT CONTRAST TECHNIQUE: Multiplanar, multiecho pulse sequences of the brain and surrounding structures were obtained without intravenous contrast. COMPARISON:  Head CT 0343 hours today.  Brain MRI 04/01/2021. FINDINGS: Brain: Patchy chronic left MCA middle division cortical encephalomalacia, primarily affecting the operculum, stable since 2022. Chronic lacunar infarcts in the bilateral caudate nuclei are stable  since 2022. Evolution of left thalamic lacunar infarct since the previous MRI. Multiple small bilateral cerebellar infarcts are new or increased since 2022. small chronic right inferior parietal lobe cortical encephalomalacia is stable. Patchy acute lacunar type infarct tracking from the posterior left corona radiata into the left lentiform (series 5 images 31-27. mild T2 and FLAIR hyperintensity. No hemorrhage or mass effect. No other diffusion restriction. No midline shift, mass effect, evidence of mass lesion, ventriculomegaly, extra-axial collection or acute intracranial hemorrhage. Cervicomedullary junction and pituitary are within normal limits. Vascular: Major intracranial vascular flow voids are stable since 2022. Skull and upper cervical spine: Chronic lacunar infarcts in the bilateral caudate nuclei are stable since 2022. Evolution of left thalamic lacunar infarct since the previous MRI. Small bilateral cerebellar infarcts are new or increased since 2022. Patchy acute lacunar type infarct tracking from the posterior left corona radiata into the left lentiform (series 5 images 31-27. Sinuses/Orbits: Negative. Visualized bone marrow signal is within normal limits. Other: Stable, negative. IMPRESSION: 1.  Acute lacunar infarct tracking from the posterior left corona radiata into the left lentiform. No associated hemorrhage or mass effect. 2. Underlying chronic small and medium-sized vessel ischemia, including a previous left MCA territory operculum infarct. 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 Electronically Signed   By: VEAR Hurst M.D.   On: 08/18/2024 06:51   CT HEAD WO CONTRAST ( ) Result Date: 08/18/2024 CLINICAL DATA:  Neuro deficit. Fell from standing. Right-side feels funny. Weakness. EXAM: CT HEAD WITHOUT CONTRAST TECHNIQUE: Contiguous axial images were obtained from the base of the skull through the vertex without intravenous contrast. RADIATION DOSE REDUCTION: This  exam was performed according to the departmental dose-optimization program which includes automated exposure control, adjustment of the mA and/or kV according to patient size and/or use of iterative reconstruction technique. COMPARISON:  CT 04/01/2021 FINDINGS: Brain: No intracranial hemorrhage, mass effect, or evidence of acute infarct. No hydrocephalus. No extra-axial fluid collection. Chronic left frontal infarct. Vascular: No hyperdense vessel or unexpected calcification. Skull: No fracture or focal lesion. Sinuses/Orbits: No acute finding. Other: None. IMPRESSION: No acute intracranial abnormality. Electronically Signed   By: Norman Gatlin M.D.   On: 08/18/2024 03:56    Microbiology: Results for orders placed or performed during the hospital encounter of 08/18/24  Group A Strep by PCR ([facility] Only)     Status: None   Collection Time: 08/18/24  3:41 AM   Specimen: Throat; Sterile Swab  Result Value Ref Range Status   Group A Strep by PCR NOT DETECTED NOT DETECTED Final    Comment: Performed at [facility], [address] [place], [place] [address]  Resp panel by RT-PCR (RSV, Flu A&B, Covid) Throat     Status: None   Collection Time: 08/18/24  3:41 AM   Specimen: Throat; Nasal Swab  Result Value Ref Range Status   SARS Coronavirus 2 by RT PCR NEGATIVE NEGATIVE Final    Comment: (NOTE) SARS-CoV-2 target nucleic acids are NOT DETECTED.  The SARS-CoV-2 RNA is generally detectable in upper respiratory specimens during the acute phase of infection. The lowest concentration of SARS-CoV-2 viral copies this assay can detect is 138 copies/mL. A negative result does not preclude SARS-Cov-2 infection and should not be used as the sole basis for treatment or other patient management decisions. A negative result may occur with  improper specimen collection/handling, submission of specimen other than nasopharyngeal swab, presence of viral mutation(s) within the areas targeted by this  assay, and inadequate number of viral copies(<138 copies/mL). A negative result must be combined with clinical observations, patient history, and epidemiological information. The expected result is Negative.  Fact Sheet for Patients:  BloggerCourse.com  Fact Sheet for Healthcare Providers:  SeriousBroker.it  This test is no t yet approved or cleared by the [place]  FDA and  has been authorized for detection and/or diagnosis of SARS-CoV-2 by FDA under an Emergency Use Authorization (EUA). This EUA will remain  in effect (meaning this test can be used) for the duration of the COVID-19 declaration under Section 564(b)(1) of the Act, 21 U.S.C.section 360bbb-3(b)(1), unless the authorization is terminated  or revoked sooner.       Influenza A by PCR NEGATIVE NEGATIVE Final   Influenza B by PCR NEGATIVE NEGATIVE Final    Comment: (NOTE) The Xpert Xpress SARS-CoV-2/FLU/RSV plus assay is intended as an aid in the diagnosis of influenza from Nasopharyngeal swab specimens and should not be used as a sole basis for treatment. Nasal washings and aspirates  are unacceptable for Xpert Xpress SARS-CoV-2/FLU/RSV testing.  Fact Sheet for Patients: BloggerCourse.com  Fact Sheet for Healthcare Providers: SeriousBroker.it  This test is not yet approved or cleared by the [place]  FDA and has been authorized for detection and/or diagnosis of SARS-CoV-2 by FDA under an Emergency Use Authorization (EUA). This EUA will remain in effect (meaning this test can be used) for the duration of the COVID-19 declaration under Section 564(b)(1) of the Act, 21 U.S.C. section 360bbb-3(b)(1), unless the authorization is terminated or revoked.     Resp Syncytial Virus by PCR NEGATIVE NEGATIVE Final    Comment: (NOTE) Fact Sheet for Patients: BloggerCourse.com  Fact Sheet for  Healthcare Providers: SeriousBroker.it  This test is not yet approved or cleared by the [place]  FDA and has been authorized for detection and/or diagnosis of SARS-CoV-2 by FDA under an Emergency Use Authorization (EUA). This EUA will remain in effect (meaning this test can be used) for the duration of the COVID-19 declaration under Section 564(b)(1) of the Act, 21 U.S.C. section 360bbb-3(b)(1), unless the authorization is terminated or revoked.  Performed at [facility], [address] [place], [place] [address]   Hsv Culture And Typing     Status: None   Collection Time: 08/18/24  4:00 AM  Result Value Ref Range Status   HSV Culture/Type Comment  Final    Comment: (NOTE) Negative No Herpes simplex virus isolated. Performed At: [facility] [address] [place], [place] [id number] Jennette Shorter MD Ey:[id number]    Source of Sample THROAT  Final    Comment: Performed at [facility], [address] [place], [place] [address]    Labs: CBC: Recent Labs  Lab 08/20/24 0603 08/21/24 0406 08/22/24 0428 08/23/24 0411 08/24/24 0542  WBC 8.1 6.8 7.3 7.8 7.7  HGB 14.8 15.1 15.5 15.2 14.8  HCT 41.5 43.9 44.4 44.0 42.2  MCV 96.5 98.4 98.7 98.9 96.8  PLT 204 224 231 251 230   Basic Metabolic Panel: Recent Labs  Lab 08/20/24 0603 08/21/24 0406 08/22/24 0428 08/23/24 0411 08/24/24 0542  NA 139 139 141 140 137  K 3.7 3.6 3.9 4.0 3.8  CL 108 103 106 105 104  CO2 22 26 26 26 25   GLUCOSE 113* 105* 104* 106* 113*  BUN 23 27* 34* 36* 34*  CREATININE 1.16 1.25* 1.20 1.28* 1.24  CALCIUM  9.6 9.8 9.7 9.5 9.3   Liver Function Tests: No results for input(s): AST, ALT, ALKPHOS, BILITOT, PROT, ALBUMIN in the last 168 hours.  CBG: No results for input(s): GLUCAP in the last 168 hours.  Discharge time spent: greater than 30 minutes.  Signed: Cresencio Fairly, MD [facility] 08/26/2024

## 2024-08-24 NOTE — Care Management Important Message (Signed)
 Important Message  Patient Details  Name: Terry [name] MRN: [id number] Date of Birth: 1951-05-21   Important Message Given:  Yes - Medicare IM     Lacy Taglieri W, CMA 08/24/2024, 11:04 AM

## 2024-08-24 NOTE — Progress Notes (Signed)
 PROGRESS NOTE   HPI was taken from Dr. Laurita:  Terry [name] is a 73 y.o. 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   Patient started to feel mild weakness on the right hand yesterday  right arm appears to be sleeping he brought attributed to possible pressure palsy.  But this morning, patient woke up after midnight around 230 and this time he developed both significant weakness of the right arm and right leg.  Patient was last known normal around 9 PM yesterday.  He takes Eliquis  for A-fib for last 6 years and has had 1 stroke in 2022 and 2 mini strokes in 2023 and 2024.   ED Course: Afebrile, no tachycardia no hypotension.  MRI showed right lacunar stroke in the right corona radiata.  CTA negative for LVO.  CTs of the tissue of neck negative for mass or infection or inflammation.  Blood work showed creatinine 1.5 BUN 16 glucose 122 hemoglobin 13.7 WBC 7.8.  8/27: CIR denied.   8/28: TOC working on SNF placement, auth pending   Terry [name]  FMW:[id number] DOB: 02/28/1951 DOA: 08/18/2024 PCP: [name], MD  Assessment & Plan:   Active Problems:   Acute CVA (cerebrovascular accident) (HCC)   CVA (cerebral vascular accident) (HCC)   AKI (acute kidney injury) (HCC)   Paroxysmal atrial fibrillation (HCC)   Mitral valve prolapse  Assessment and Plan: CVA: w/ right sided paresis. Continue on aspirin  & pradaxa  (pradaxa  was started this admission by neuro). Hx of previous CVA w/ residual aphasia. PT/OT recs CIR. Pt agrees to CIR. insurance denied CIR.  TOC working on SNF   AKI: w/ mild left hydronephrosis as per renal US . Cr is trending down from day prior    PAF: continue on pradaxa  as per neuro. Pradaxa  is not covered by pt's insurance but pt can get a 3 month  supply of pradaxa  for  approx $90 at [facility] which is online discount pharmacy and the e-script needs to be sent and include the pt's email address: [email or web address]. will send it to [facility] per pharmacy d/w patient.   Hx of mitral prolapse: s/p bovine mitral valve replacement. Echo shows EF 40-45%, LV global hypokinesis, grade I diastolic dysfunction, mild MR, mod-severe MS, mod AS. Will need to f/u outpatient w/ cardio      DVT prophylaxis: pradaxa  Code Status: full  Family Communication: friend at bedside Disposition Plan: possibly d/c to SNF.    Level of care: Med-Surg  Status is: Inpatient Remains inpatient appropriate because: medically stable. waiting for SNF    Consultants:  Neuro     Subjective:  No new issues, friend at bedside.  Objective: Vitals:   08/23/24 1559 08/24/24 0334 08/24/24 0739 08/24/24 1620  BP: 114/69 100/87 120/72 119/84  Pulse: (!) 42 91 78 89  Resp: 16 16 16 18   Temp: 98.4 F (36.9 C) 98.2 F (36.8 C) 98 F (36.7 C) 97.6 F (36.4 C)  TempSrc: Oral     SpO2: 96% 96% 98% 98%  Weight:      Height:        Intake/Output Summary (Last 24 hours) at 08/24/2024 1705 Last data filed at 08/24/2024 0956 Gross per 24 hour  Intake 720 ml  Output --  Net 720 ml   Filed Weights   08/18/24 0321  Weight: 82 kg    Examination:  General exam: appears comfortable  Respiratory system: clear breath sounds b/l  Cardiovascular system: S1/S2+. No rubs or clicks  Gastrointestinal system: abd is soft, NT, ND & hypoactive bowel sounds  Central nervous system: alert & oriented. Unable to move right arm & able to move/lift right leg some Psychiatry: judgement and insight appears at baseline. Appropriate mood and affect    Data Reviewed: I have personally reviewed following labs and imaging studies  CBC: Recent Labs  Lab 08/18/24 0321 08/20/24 0603 08/21/24 0406 08/22/24 0428 08/23/24 0411 08/24/24 0542  WBC 7.8 8.1 6.8 7.3 7.8 7.7  NEUTROABS 5.9  --   --    --   --   --   HGB 13.7 14.8 15.1 15.5 15.2 14.8  HCT 39.3 41.5 43.9 44.4 44.0 42.2  MCV 99.2 96.5 98.4 98.7 98.9 96.8  PLT 176 204 224 231 251 230   Basic Metabolic Panel: Recent Labs  Lab 08/18/24 0341 08/19/24 0614 08/20/24 0603 08/21/24 0406 08/22/24 0428 08/23/24 0411 08/24/24 0542  NA  --    < > 139 139 141 140 137  K  --    < > 3.7 3.6 3.9 4.0 3.8  CL  --    < > 108 103 106 105 104  CO2  --    < > 22 26 26 26 25   GLUCOSE  --    < > 113* 105* 104* 106* 113*  BUN  --    < > 23 27* 34* 36* 34*  CREATININE  --    < > 1.16 1.25* 1.20 1.28* 1.24  CALCIUM   --    < > 9.6 9.8 9.7 9.5 9.3  MG 1.9  --   --   --   --   --   --    < > = values in this interval not displayed.   GFR: Estimated Creatinine Clearance: 53.6 mL/min (by C-G formula based on SCr of 1.24 mg/dL). Liver Function Tests: Recent Labs  Lab 08/18/24 0321  AST 24  ALT 20  ALKPHOS 80  BILITOT 0.8  PROT 6.8  ALBUMIN 3.9   No results for input(s): LIPASE, AMYLASE in the last 168 hours. No results for input(s): AMMONIA in the last 168 hours. Coagulation Profile: Recent Labs  Lab 08/18/24 0321  INR 1.3*     Recent Results (from the past 240 hours)  Group A Strep by PCR ([facility] Only)     Status: None   Collection Time: 08/18/24  3:41 AM   Specimen: Throat; Sterile Swab  Result Value Ref Range Status   Group A Strep by PCR NOT DETECTED NOT DETECTED Final    Comment: Performed at [facility], [address] [place], [place] [address]  Resp panel by RT-PCR (RSV, Flu A&B, Covid) Throat     Status: None   Collection Time: 08/18/24  3:41 AM   Specimen: Throat; Nasal Swab  Result Value Ref Range Status   SARS Coronavirus 2 by RT PCR NEGATIVE NEGATIVE Final    Comment: (NOTE) SARS-CoV-2 target nucleic acids are NOT DETECTED.  The SARS-CoV-2 RNA is generally detectable in upper respiratory specimens during the acute phase of infection. The lowest concentration of SARS-CoV-2 viral copies  this assay can detect is 138 copies/mL. A negative result does not preclude SARS-Cov-2 infection and should not be used as the sole basis for treatment or other patient management decisions. A negative result may occur with  improper specimen collection/handling, submission of specimen other than nasopharyngeal swab, presence of viral mutation(s) within the areas targeted by this assay, and inadequate number of viral copies(<138 copies/mL). A negative result must be combined with clinical observations, patient history, and epidemiological information. The expected result is Negative.  Fact Sheet for Patients:  BloggerCourse.com  Fact Sheet for Healthcare Providers:  SeriousBroker.it  This test is no t yet approved or cleared by the [place]  FDA and  has been authorized for detection and/or diagnosis of SARS-CoV-2 by FDA under an Emergency Use Authorization (EUA). This EUA will remain  in effect (meaning this test can be used) for the duration of the COVID-19 declaration under Section 564(b)(1) of the Act, 21 U.S.C.section 360bbb-3(b)(1), unless the authorization is terminated  or revoked sooner.       Influenza A by PCR NEGATIVE NEGATIVE Final   Influenza B by PCR NEGATIVE NEGATIVE Final    Comment: (NOTE) The Xpert Xpress SARS-CoV-2/FLU/RSV plus assay is intended as an aid in the diagnosis of influenza from Nasopharyngeal swab specimens and should not be used as a sole basis for treatment. Nasal washings and aspirates are unacceptable for Xpert Xpress SARS-CoV-2/FLU/RSV testing.  Fact Sheet for Patients: BloggerCourse.com  Fact Sheet for Healthcare Providers: SeriousBroker.it  This test is not yet approved or cleared by the [place]  FDA and has been authorized for detection and/or diagnosis of SARS-CoV-2 by FDA under an Emergency Use Authorization (EUA). This EUA  will remain in effect (meaning this test can be used) for the duration of the COVID-19 declaration under Section 564(b)(1) of the Act, 21 U.S.C. section 360bbb-3(b)(1), unless the authorization is terminated or revoked.     Resp Syncytial Virus by PCR NEGATIVE NEGATIVE Final    Comment: (NOTE) Fact Sheet for Patients: BloggerCourse.com  Fact Sheet for Healthcare Providers: SeriousBroker.it  This test is not yet approved or cleared by the [place]  FDA and has been authorized for detection and/or diagnosis of SARS-CoV-2 by FDA under an Emergency Use Authorization (EUA). This EUA will remain in effect (meaning this test can be used) for the duration of the COVID-19 declaration under Section 564(b)(1) of the Act, 21 U.S.C. section 360bbb-3(b)(1), unless the authorization is terminated or revoked.  Performed at [facility], [address] [place], [place] [address]   Hsv Culture And Typing     Status: None   Collection Time: 08/18/24  4:00 AM  Result Value Ref Range Status   HSV Culture/Type Comment  Final    Comment: (NOTE) Negative No Herpes simplex virus isolated. Performed At: [facility] [address] [place], [place] [id number] Jennette Shorter MD Ey:[id number]    Source of Sample THROAT  Final    Comment: Performed at [facility], [address] [place], [place] [address]        Scheduled Meds:  aspirin  EC  81 mg Oral Daily   atorvastatin   80 mg Oral QHS   dabigatran   150 mg Oral Q12H   melatonin  5 mg Oral QHS   pantoprazole   40 mg Oral Daily   Continuous Infusions:   LOS: 5 days   Time spent 35 minutes  Cresencio Fairly, MD [facility] Pager [phone]  If 7PM-7AM, please contact night-coverage [email or web address] 08/24/2024, 5:05 PM

## 2024-08-24 NOTE — Progress Notes (Signed)
 Physical Therapy Treatment Patient Details Name: Terry [name] MRN: [id number] DOB: 04-28-1951 Today's Date: 08/24/2024   History of Present Illness Pt is a 73 y.o. male presented with new onset of right-sided weakness and fall. MRI this AM confirmed lacunar infarct and stat CT completed around 1230pm with evidence of mild extension of the left deep white and deep gray matter lacunar infarct. Cleared by neuro for participation with evaluation. 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 Pt denied pain throughout session. Pt was able to complete bed mobility with mod-maxA with cues for hooking RLE with LLE to assist with RLE movement. Session focused on education on slide board transfer <> manual WC, extensive education provided on WC components and multimodal cues given to pt to operate WC parts. Lateral scoot transfers with slide board to pt's L completed with maxA, pt was able to lean anteriorly/laterally to initiate transfer with cues provided for head-hips relationship. Pt was able to propel manual WC ~169ft total over 2 bouts, physical assistance required to hold pt's RLE above ground 2/2 WC not having leg rests. Pt was able to propel WC with LLE/LUE well with minimal path variance. The patient would benefit from further skilled PT intervention to continue to progress towards goals.     If plan is discharge home, recommend the following: Two people to help with walking and/or transfers;Two people to help with bathing/dressing/bathroom   Can travel by Doctor, hospital (measurements PT);Wheelchair cushion (measurements PT);Hospital bed;BSC/3in1    Recommendations for Other Services       Precautions / Restrictions  Precautions Precautions: Fall Recall of Precautions/Restrictions: Intact Precaution/Restrictions Comments: R hemiplegia Restrictions Weight Bearing Restrictions Per Provider Order: No     Mobility  Bed Mobility Overal bed mobility: Needs Assistance Bed Mobility: Supine to Sit, Sit to Supine     Supine to sit: Mod assist, HOB elevated Sit to supine: Max assist   General bed mobility comments: Pt able to initiate supine > sit with LLE hooked under RLE- modA to achieve sitting EOB.    Transfers Overall transfer level: Needs assistance Equipment used: Sliding board, Pushed w/c Transfers: Bed to chair/wheelchair/BSC            Lateral/Scoot Transfers: Max assist, With slide board General transfer comment: maxA for slide board transfers bed <> WC to pt's L side. Pt given cues for head-hips relationship to assist with transfer    Ambulation/Gait                   Psychologist, counselling mobility: Yes Wheelchair propulsion: Left upper extremity, Left lower extremity Wheelchair parts: Needs assistance Distance: 148ft over 2 bouts Wheelchair Assistance Details (indicate cue type and reason): Initially attempted WC propulsion with pt hooking RLE with LLE, progressed to physical assistance holding LLE to allow pt to propel with LLE and LUE. Pt given step-by-step instructions and multimodal cues for WC parts   Tilt Bed    Modified Rankin (Stroke Patients Only)       Balance Overall balance assessment: Needs assistance Sitting-balance support: Feet supported, No upper extremity supported Sitting balance-Leahy Scale: Fair Sitting balance -  Comments: Pt able to laterally lean to R side and return to midline for slide board placement                                    Communication Communication Communication: Impaired Factors Affecting Communication: Reduced clarity of speech  Cognition Arousal:  Alert Behavior During Therapy: WFL for tasks assessed/performed   PT - Cognitive impairments: No apparent impairments                         Following commands: Intact      Cueing Cueing Techniques: Verbal cues, Tactile cues, Visual cues  Exercises      General Comments        Pertinent Vitals/Pain Pain Assessment Pain Assessment: No/denies pain    Home Living                          Prior Function            PT Goals (current goals can now be found in the care plan section) Progress towards PT goals: Progressing toward goals    Frequency    Min 3X/week      PT Plan      Co-evaluation              AM-PAC PT 6 Clicks Mobility   Outcome Measure  Help needed turning from your back to your side while in a flat bed without using bedrails?: A Lot Help needed moving from lying on your back to sitting on the side of a flat bed without using bedrails?: A Lot Help needed moving to and from a bed to a chair (including a wheelchair)?: A Lot Help needed standing up from a chair using your arms (e.g., wheelchair or bedside chair)?: A Lot Help needed to walk in hospital room?: Total Help needed climbing 3-5 steps with a railing? : Total 6 Click Score: 10    End of Session Equipment Utilized During Treatment: Gait belt Activity Tolerance: Patient tolerated treatment well Patient left: in bed;with call bell/phone within reach;with bed alarm set Nurse Communication: Mobility status PT Visit Diagnosis: Hemiplegia and hemiparesis Hemiplegia - Right/Left: Right Hemiplegia - dominant/non-dominant: Dominant Hemiplegia - caused by: Cerebral infarction     Time: 8553-8481 PT Time Calculation (min) (ACUTE ONLY): 32 min  Charges:    $Therapeutic Activity: 8-22 mins $Wheel Chair Management: 8-22 mins PT General Charges $$ ACUTE PT VISIT: 1 Visit                     Janell Axe, SPT

## 2024-08-24 NOTE — Progress Notes (Addendum)
 Speech Language Pathology Treatment: Cognitive-Linguistic  Patient Details Name: Terry [name] MRN: [id number] DOB: 29-Oct-1951 Today's Date: 08/24/2024 Time: 9054-8969 SLP Time Calculation (min) (ACUTE ONLY): 45 min  Assessment / Plan / Recommendation Clinical Impression  Patient seen today for ongoing assessment and tx of Dysarthria, and monitoring of any swallowing issues/dysphagia. Pt was awake/alert; verbal. Brother present; Wife/Family member arrived towards end of session. MD present initially. Pt was A/Ox4. He was verbal and engaged appropriately in conversation; quite talkative and intermittently distracted w/ the multiple, ongoing conversations in the room at times. He followed instructions appropriately. Noted continued RUE flaccidness.  On RA, afebrile. WBC wnl.  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 Per his last tx note in 2022:  Pt reports that he is making progress with his frustration level as well as increased use of word finding strategies during social situations, including stressful situation.  He also reports that he is focused on using a slower rate to increase his speech intelligibility. He also reports that he has been recording himself talking and playing it back to keep his speech calibrated. At this time, pt requests discharge from ST as he has met all STG and LTGs.   Pt completed speech/fluency and articulation tasks; OM exs. Speech intelligibility was 75-100% during individual speech tasks and general conversation- noted pt talked quickly w/ a fast rate of speech which heavily impacted intelligibility in conversation. When he slowed his rate of speech and utilized his strategies of BIG, LOUD, SLOW, articulatory precision and intelligibility of speech was much improved; pt and Brother noted the same.  Pt and Family endorsed they spoke in casual speech w/out full enunciation(ie, dropping endings of  words) at home at Baseline: we all talk a lot and we talk fast!. Pt often forgot to use his strategies- min verbal cue/gesture to posted precautions given by this SLP.  Pt exhibited motor speech deficits c/b Dysarthria; decreased articulation of speech sounds. Deficits c/b imprecise consonants(lateral sounds too) and increased rate of speech. Vocal quality and resonance appeared [facility]. Min decreased breath support for speech noted intermittently- discussed w/ pt that when he SLOWED his speech, he could find more opportunities to replenish his breath support for his speech. Practiced w/ short phrases(SLOWLY). OM weakness of Right lingual/labial weakness and decreased ROM/Symmetry and min lingual deviation to the R noted(unsure if new?) continue; lingual strength/ROM appeared WFL in isolated movements, but suspect decreased in connected movements such as speech. Decreased R labial-facial tone/strength continues. Pt endorsed min R labial wetness/drooling at lips though no overt drooling noted during session.    SLP provided education re: importance of skilled ST services to address/re-educate on speech-related strategies to combat the Dysarthria and improve speech intelligibility. Pt and Family present educated on speech/articulatory strategies to include: increasing loudness(w/ appropriate breath support), slowing rate of speech, and over-articulation: BIG, LOUD, SLOW as posted in room on wall. These were practiced w/ Menu reading and automatic speech tasks.   Education and handouts given on the strategies; practiced w/ pt. Questions answered.      ALSO: Recommend continue a fairly Regular consistency diet w/ well-Cut meats, moistened foods; Thin liquids -- via Cup(less straw use). Pt stated he was drinking from the Cup more for better control when drinking. Recommend general aspiration precautions to include Small bites/sips Slowly and ensure oral clearing using lingual/finger sweep when needed. Must sit  fully upright for all oral intake. FULL TRAY/MEAL PREP at  meals d/t pt unable to use RUE. Less distractions and talking during meals. Pills WHOLE in Puree for best oral control and safer swallowing -- it is encouraged now and for D/C to the pt.  Education given on Pills in Puree; food consistencies and easy to eat options; general aspiration precautions(NO Straw use); tray prep needs to pt. Recommend Dietician f/u for support. Precautions posted in chart, room.  MD/NSG updated on POC and need for f/u post D/C at next venue of care; MD agreed. ST services will be available while admitted for ongoing Dysarthria tx to address and enhance communication abilities in ADLs.        HPI HPI: Pt is a 73 y.o. 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  No c/o difficulty swallowing.   MRI brain showed lacunar infarction in the left frontal radiata/lentiform nuclei with mild expansion on follow-up CT.      SLP Plan  Continue with current plan of care          Recommendations  Diet recommendations: Regular;Thin liquid (cut/chopped meats/foods, well-moistened) Liquids provided via: Cup (less straw use if coughing noted) Medication Administration: Whole meds with puree Supervision: Patient able to self feed;Staff to assist with self feeding;Intermittent supervision to cue for compensatory  strategies (setup d/t unable to use RUE) Compensations: Minimize environmental distractions;Slow rate;Small sips/bites;Lingual sweep for clearance of pocketing;Follow solids with liquid Postural Changes and/or Swallow Maneuvers: Seated upright 90 degrees;Out of bed for meals;Upright 30-60 min after meal                 (Dietician support for possible drink supplement) Oral care BID;Oral care before and after PO;Patient independent with oral care;Staff/trained caregiver to provide oral care (setup and support)   Intermittent Supervision/Assistance (w/ the speech exs and follow through) Dysarthria and anarthria (R47.1)     Continue with current plan of care       [name], MS, CCC-SLP Speech Language Pathologist Rehab Services; [facility] [phone] (ascom) Rosemarie Galvis  08/24/2024, 12:44 PM

## 2024-08-24 NOTE — Progress Notes (Signed)
 Mobility Specialist - Progress Note    08/24/24 1433  Mobility  Activity Pivoted/transferred from chair to bed  Level of Assistance +2 (takes two people)  Assistive Device [employer] of [employer]/Exercises Active  Activity Response Tolerated well  Mobility Referral Yes  Mobility visit 1 Mobility  Mobility Specialist Start Time (ACUTE ONLY) 1432  Mobility Specialist Stop Time (ACUTE ONLY) 1446  Mobility Specialist Time Calculation (min) (ACUTE ONLY) 14 min   Pt resting in recliner on RA upon entry. Pt STS +2 with Camie Provost and transferred to bed. Pt left in bed with needs in reach and bed alarm activated.   Guido Rumble Mobility Specialist 08/24/24, 2:52 PM

## 2024-08-24 NOTE — TOC Progression Note (Signed)
 Transition of Care [facility]) - Progression Note    Patient Details  Name: Terry [name] MRN: [id number] Date of Birth: [birth year]  [facility]) CM/SW Contact  Dalia GORMAN Fuse, RN Phone Number: 08/24/2024, 12:10 PM  Clinical Narrative:    Bed offers from [facility], [facility], [facility], [facility], [facility], and [facility]. He selected [facility], [facility], and [facility] in that order. TOC called Alfonso at [facility] to accept the bed. Request for insurance authorization called to Jori [phone] with health team [employer].  TOC will continue to follow.                     Expected Discharge Plan and Services         Expected Discharge Date: 08/22/24                                     Social Drivers of Health (SDOH) Interventions SDOH Screenings   Food Insecurity: No Food Insecurity (08/18/2024)  Housing: Unknown (08/18/2024)  Transportation Needs: No Transportation Needs (08/18/2024)  Utilities: Not At Risk (08/18/2024)  Depression (PHQ2-9): Low Risk  (07/26/2020)  Recent Concern: Depression (PHQ2-9) - Medium Risk (05/23/2020)  Financial Resource Strain: Low Risk  (07/18/2024)   Received from [facility]  Social Connections: Socially Integrated (08/18/2024)  Tobacco Use: Medium Risk (08/18/2024)  Health Literacy: Low Risk  (12/17/2021)   Received from [facility]    Readmission Risk Interventions     No data to display

## 2024-08-25 DIAGNOSIS — I480 Paroxysmal atrial fibrillation: Secondary | ICD-10-CM | POA: Diagnosis not present

## 2024-08-25 DIAGNOSIS — I341 Nonrheumatic mitral (valve) prolapse: Secondary | ICD-10-CM | POA: Diagnosis not present

## 2024-08-25 DIAGNOSIS — N179 Acute kidney failure, unspecified: Secondary | ICD-10-CM | POA: Diagnosis not present

## 2024-08-25 DIAGNOSIS — I639 Cerebral infarction, unspecified: Secondary | ICD-10-CM | POA: Diagnosis not present

## 2024-08-25 MED ORDER — BISACODYL 10 MG RE SUPP
10.0000 mg | Freq: Once | RECTAL | Status: AC
  Administered 2024-08-25: 10 mg via RECTAL
  Filled 2024-08-25: qty 1

## 2024-08-25 MED ORDER — FLEET ENEMA RE ENEM
1.0000 | ENEMA | Freq: Once | RECTAL | Status: AC
  Administered 2024-08-25: 1 via RECTAL

## 2024-08-25 NOTE — TOC Progression Note (Addendum)
 Transition of Care [facility]) - Progression Note    Patient Details  Name: Terry [name] MRN: [id number] Date of Birth: 31-Dec-1950  [facility]) CM/SW Contact  Dalia GORMAN Fuse, RN Phone Number: 08/25/2024, 10:18 AM  Clinical Narrative:    [facility] PASRR obtained [id number]  1340: TOC called to [facility] and spoke with Jori. Per Jori the case is with Madelin and is Medical Review. Connie sent a message to [facility] requesting that she call TOC.  1607: [facility] has not provided auth. TOC outreached to [place] at [facility] to advise. Alfonso will accept the patient over the weekend. The weekend [facility] should give her a call tomorrow.   [id number]  Auth is valid for 7 days begging 8/29                   Expected Discharge Plan and Services         Expected Discharge Date: 08/22/24                                     Social Drivers of Health (SDOH) Interventions SDOH Screenings   Food Insecurity: No Food Insecurity (08/18/2024)  Housing: Unknown (08/18/2024)  Transportation Needs: No Transportation Needs (08/18/2024)  Utilities: Not At Risk (08/18/2024)  Depression (PHQ2-9): Low Risk  (07/26/2020)  Recent Concern: Depression (PHQ2-9) - Medium Risk (05/23/2020)  Financial Resource Strain: Low Risk  (07/18/2024)   Received from [facility]  Social Connections: Socially Integrated (08/18/2024)  Tobacco Use: Medium Risk (08/18/2024)  Health Literacy: Low Risk  (12/17/2021)   Received from [facility]    Readmission Risk Interventions     No data to display

## 2024-08-25 NOTE — Progress Notes (Signed)
 PROGRESS NOTE   HPI was taken from Dr. Laurita:  Terry [name] is a 73 y.o. 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   Patient started to feel mild weakness on the right hand yesterday  right arm appears to be sleeping he brought attributed to possible pressure palsy.  But this morning, patient woke up after midnight around 230 and this time he developed both significant weakness of the right arm and right leg.  Patient was last known normal around 9 PM yesterday.  He takes Eliquis  for A-fib for last 6 years and has had 1 stroke in 2022 and 2 mini strokes in 2023 and 2024.   ED Course: Afebrile, no tachycardia no hypotension.  MRI showed right lacunar stroke in the right corona radiata.  CTA negative for LVO.  CTs of the tissue of neck negative for mass or infection or inflammation.  Blood work showed creatinine 1.5 BUN 16 glucose 122 hemoglobin 13.7 WBC 7.8.  8/27: CIR denied.   8/28: TOC working on SNF placement, auth pending 8/29: Insurance Auth approved but now too late for [facility] to take him today   Terry [name]  FMW:[id number] DOB: Nov 24, 1951 DOA: 08/18/2024 PCP: [name], MD  Assessment & Plan:   Active Problems:   Acute CVA (cerebrovascular accident) (HCC)   CVA (cerebral vascular accident) (HCC)   AKI (acute kidney injury) (HCC)   Paroxysmal atrial fibrillation (HCC)   Mitral valve prolapse  Assessment and Plan: CVA: w/ right sided paresis. Continue on aspirin  & pradaxa  (pradaxa  was started this admission by neuro). Hx of previous CVA w/ residual aphasia. PT/OT recs CIR. Pt agrees to CIR. insurance denied CIR.  TOC working on SNF.  Hopeful to have discharge to [facility] tomorrow   AKI: w/ mild left hydronephrosis as per renal US . Cr is trending down from day prior     PAF: continue on pradaxa  as per neuro. Pradaxa  is not covered by pt's insurance but pt can get a 3 month  supply of pradaxa  for approx $90 at [facility] which is online discount pharmacy and the e-script needs to be sent and include the pt's email address: [email or web address]. will send it to [facility] per pharmacy d/w patient.   Hx of mitral prolapse: s/p bovine mitral valve replacement. Echo shows EF 40-45%, LV global hypokinesis, grade I diastolic dysfunction, mild MR, mod-severe MS, mod AS. Will need to f/u outpatient w/ cardio      DVT prophylaxis: pradaxa  Code Status: full  Family Communication: friend at bedside Disposition Plan: possibly d/c to SNF.    Level of care: Med-Surg  Status is: Inpatient Remains inpatient appropriate because: medically stable. waiting for SNF    Consultants:  Neuro     Subjective:  No new issues, family at bedside hopeful to get him to SNF soon  Objective: Vitals:   08/24/24 1620 08/24/24 2000 08/25/24 0447 08/25/24 0840  BP: 119/84 122/77 122/84 107/79  Pulse: 89 85 79 86  Resp: 18 16 16 19   Temp: 97.6 F (36.4 C) 98.8 F (37.1 C) 98.8 F (37.1 C) (!) 96.5 F (35.8 C)  TempSrc:  Oral    SpO2: 98% 97% 98% 95%  Weight:      Height:        Intake/Output Summary (Last 24 hours) at  08/25/2024 1634 Last data filed at 08/25/2024 1300 Gross per 24 hour  Intake 600 ml  Output 300 ml  Net 300 ml   Filed Weights   08/18/24 0321  Weight: 82 kg    Examination:  General exam: appears comfortable  Respiratory system: clear breath sounds b/l  Cardiovascular system: S1/S2+. No rubs or clicks  Gastrointestinal system: abd is soft, NT, ND & hypoactive bowel sounds  Central nervous system: alert & oriented. Unable to move right arm & able to move his right leg Psychiatry: judgement and insight appears at baseline. Appropriate mood and affect    Data Reviewed: I have personally reviewed following labs and imaging  studies  CBC: Recent Labs  Lab 08/20/24 0603 08/21/24 0406 08/22/24 0428 08/23/24 0411 08/24/24 0542  WBC 8.1 6.8 7.3 7.8 7.7  HGB 14.8 15.1 15.5 15.2 14.8  HCT 41.5 43.9 44.4 44.0 42.2  MCV 96.5 98.4 98.7 98.9 96.8  PLT 204 224 231 251 230   Basic Metabolic Panel: Recent Labs  Lab 08/20/24 0603 08/21/24 0406 08/22/24 0428 08/23/24 0411 08/24/24 0542  NA 139 139 141 140 137  K 3.7 3.6 3.9 4.0 3.8  CL 108 103 106 105 104  CO2 22 26 26 26 25   GLUCOSE 113* 105* 104* 106* 113*  BUN 23 27* 34* 36* 34*  CREATININE 1.16 1.25* 1.20 1.28* 1.24  CALCIUM  9.6 9.8 9.7 9.5 9.3   GFR: Estimated Creatinine Clearance: 53.6 mL/min (by C-G formula based on SCr of 1.24 mg/dL). Liver Function Tests: No results for input(s): AST, ALT, ALKPHOS, BILITOT, PROT, ALBUMIN in the last 168 hours.  No results for input(s): LIPASE, AMYLASE in the last 168 hours. No results for input(s): AMMONIA in the last 168 hours. Coagulation Profile: No results for input(s): INR, PROTIME in the last 168 hours.    Recent Results (from the past 240 hours)  Group A Strep by PCR ([facility] Only)     Status: None   Collection Time: 08/18/24  3:41 AM   Specimen: Throat; Sterile Swab  Result Value Ref Range Status   Group A Strep by PCR NOT DETECTED NOT DETECTED Final    Comment: Performed at [facility], [address] [place], [place] [address]  Resp panel by RT-PCR (RSV, Flu A&B, Covid) Throat     Status: None   Collection Time: 08/18/24  3:41 AM   Specimen: Throat; Nasal Swab  Result Value Ref Range Status   SARS Coronavirus 2 by RT PCR NEGATIVE NEGATIVE Final    Comment: (NOTE) SARS-CoV-2 target nucleic acids are NOT DETECTED.  The SARS-CoV-2 RNA is generally detectable in upper respiratory specimens during the acute phase of infection. The lowest concentration of SARS-CoV-2 viral copies this assay can detect is 138 copies/mL. A negative result does not preclude  SARS-Cov-2 infection and should not be used as the sole basis for treatment or other patient management decisions. A negative result may occur with  improper specimen collection/handling, submission of specimen other than nasopharyngeal swab, presence of viral mutation(s) within the areas targeted by this assay, and inadequate number of viral copies(<138 copies/mL). A negative result must be combined with clinical observations, patient history, and epidemiological information. The expected result is Negative.  Fact Sheet for Patients:  BloggerCourse.com  Fact Sheet for Healthcare Providers:  SeriousBroker.it  This test is no t yet approved or cleared by the [place]  FDA and  has been authorized for detection and/or diagnosis of SARS-CoV-2 by FDA under an Emergency  Use Authorization (EUA). This EUA will remain  in effect (meaning this test can be used) for the duration of the COVID-19 declaration under Section 564(b)(1) of the Act, 21 U.S.C.section 360bbb-3(b)(1), unless the authorization is terminated  or revoked sooner.       Influenza A by PCR NEGATIVE NEGATIVE Final   Influenza B by PCR NEGATIVE NEGATIVE Final    Comment: (NOTE) The Xpert Xpress SARS-CoV-2/FLU/RSV plus assay is intended as an aid in the diagnosis of influenza from Nasopharyngeal swab specimens and should not be used as a sole basis for treatment. Nasal washings and aspirates are unacceptable for Xpert Xpress SARS-CoV-2/FLU/RSV testing.  Fact Sheet for Patients: BloggerCourse.com  Fact Sheet for Healthcare Providers: SeriousBroker.it  This test is not yet approved or cleared by the [place]  FDA and has been authorized for detection and/or diagnosis of SARS-CoV-2 by FDA under an Emergency Use Authorization (EUA). This EUA will remain in effect (meaning this test can be used) for the duration of  the COVID-19 declaration under Section 564(b)(1) of the Act, 21 U.S.C. section 360bbb-3(b)(1), unless the authorization is terminated or revoked.     Resp Syncytial Virus by PCR NEGATIVE NEGATIVE Final    Comment: (NOTE) Fact Sheet for Patients: BloggerCourse.com  Fact Sheet for Healthcare Providers: SeriousBroker.it  This test is not yet approved or cleared by the [place]  FDA and has been authorized for detection and/or diagnosis of SARS-CoV-2 by FDA under an Emergency Use Authorization (EUA). This EUA will remain in effect (meaning this test can be used) for the duration of the COVID-19 declaration under Section 564(b)(1) of the Act, 21 U.S.C. section 360bbb-3(b)(1), unless the authorization is terminated or revoked.  Performed at [facility], [address] [place], [place] [address]   Hsv Culture And Typing     Status: None   Collection Time: 08/18/24  4:00 AM  Result Value Ref Range Status   HSV Culture/Type Comment  Final    Comment: (NOTE) Negative No Herpes simplex virus isolated. Performed At: [facility] [address] [place], [place] [id number] Jennette Shorter MD Ey:[id number]    Source of Sample THROAT  Final    Comment: Performed at [facility], [address] [place], [place] [address]        Scheduled Meds:  aspirin  EC  81 mg Oral Daily   atorvastatin   80 mg Oral QHS   dabigatran   150 mg Oral Q12H   melatonin  5 mg Oral QHS   pantoprazole   40 mg Oral Daily   Continuous Infusions:   LOS: 6 days   Time spent 35 minutes  Cresencio Fairly, MD [facility] Pager [phone]  If 7PM-7AM, please contact night-coverage [email or web address] 08/25/2024, 4:34 PM

## 2024-08-25 NOTE — Progress Notes (Signed)
 Physical Therapy Treatment Patient Details Name: Terry [name] MRN: [id number] DOB: 1951/08/23 Today's Date: 08/25/2024   History of Present Illness Pt is a 73 y.o. male presented with new onset of right-sided weakness and fall. MRI this AM confirmed lacunar infarct and stat CT completed around 1230pm with evidence of mild extension of the left deep white and deep gray matter lacunar infarct. Cleared by neuro for participation with evaluation. 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 He demonstrated improvement in bed mobility, with extra time, verbal cues and bed feature, able to transfer to sitting EOB with CGA. L side needed to assist R. Able to maintain sitting midline for several minutes. maxA1-2 to squat pivot to WC in room (to L), and pt practiced WC parts manipulation and navigation. Noted to have more difficulty due to large WC (too large for patient) but able to propel ~34ft with LUE/LLE CGA-minA. Pt returned to supine with needs in reach.  Overall the patient demonstrated deficits (see PT Problem List) that impede the patient's functional abilities, safety, and mobility and would benefit from skilled PT intervention.       If plan is discharge home, recommend the following: Two people to help with walking and/or transfers;Two people to help with bathing/dressing/bathroom   Can travel by Doctor, hospital (measurements PT);Wheelchair cushion (measurements PT);Hospital bed;BSC/3in1    Recommendations for Other Services       Precautions / Restrictions Precautions Precautions: Fall Recall of Precautions/Restrictions: Intact Precaution/Restrictions Comments: R hemiplegia Restrictions Weight Bearing Restrictions Per Provider  Order: No     Mobility  Bed Mobility Overal bed mobility: Needs Assistance Bed Mobility: Supine to Sit, Sit to Supine, Rolling Rolling: Contact guard assist, Mod assist   Supine to sit: Contact guard, Used rails, HOB elevated     General bed mobility comments: extra time, verbal cues to attend to RUE and LLE assist with RLE. HOB elevated, reliant on bed rails. CGA to roll to R, modA to roll L for linen reposition    Transfers Overall transfer level: Needs assistance Equipment used: None Transfers: Bed to chair/wheelchair/BSC       Squat pivot transfers: Max assist, +2 safety/equipment     General transfer comment: maxA to WC, maAx2 for safety to return to EOB. pt needed sequencing cues and step by step instructions to complete    Ambulation/Gait                   Psychologist, counselling propulsion: Left upper extremity, Left lower extremity Wheelchair parts: Needs assistance Distance: 90 Wheelchair Assistance Details (indicate cue type and reason): more fatigue today, larger WC limited pt's ability to weight shift, utilize LLE fully   Tilt Bed    Modified Rankin (Stroke Patients Only)       Balance Overall balance assessment: Needs assistance Sitting-balance support: Feet supported, No upper extremity supported Sitting balance-Leahy Scale: Fair                                      Musician Communication: Impaired  Cognition Arousal: Alert Behavior During Therapy: WFL for tasks assessed/performed   PT - Cognitive impairments: No apparent impairments                         Following commands: Intact      Cueing Cueing Techniques: Verbal cues, Tactile cues, Visual cues  Exercises      General Comments        Pertinent Vitals/Pain Pain Assessment Pain Assessment: No/denies pain    Home Living                          Prior  Function            PT Goals (current goals can now be found in the care plan section) Progress towards PT goals: Progressing toward goals    Frequency    Min 3X/week      PT Plan      Co-evaluation              AM-PAC PT 6 Clicks Mobility   Outcome Measure  Help needed turning from your back to your side while in a flat bed without using bedrails?: A Lot Help needed moving from lying on your back to sitting on the side of a flat bed without using bedrails?: A Lot Help needed moving to and from a bed to a chair (including a wheelchair)?: A Lot Help needed standing up from a chair using your arms (e.g., wheelchair or bedside chair)?: A Lot Help needed to walk in hospital room?: Total Help needed climbing 3-5 steps with a railing? : Total 6 Click Score: 10    End of Session Equipment Utilized During Treatment: Gait belt Activity Tolerance: Patient tolerated treatment well Patient left: in bed;with call bell/phone within reach;with bed alarm set Nurse Communication: Mobility status PT Visit Diagnosis: Hemiplegia and hemiparesis Hemiplegia - Right/Left: Right Hemiplegia - dominant/non-dominant: Dominant Hemiplegia - caused by: Cerebral infarction     Time: 8542-8467 PT Time Calculation (min) (ACUTE ONLY): 35 min  Charges:    $Therapeutic Activity: 8-22 mins $Wheel Chair Management: 8-22 mins PT General Charges $$ ACUTE PT VISIT: 1 Visit                     Doyal Shams PT, DPT 3:50 PM,08/25/24

## 2024-08-25 NOTE — TOC Progression Note (Signed)
 Transition of Care [facility]) - Progression Note    Patient Details  Name: Terry [name] MRN: [id number] Date of Birth: 22-May-1951  [facility]) CM/SW Contact  Dalia GORMAN Fuse, RN Phone Number: 08/25/2024, 9:57 AM  Clinical Narrative:     TOC spoke with Jori at [facility] and provided diagnosis and diagnosis code. TOC reiterated the patient is medically ready and planning to go to  [facility] today.   TOC will continue to follow.                    Expected Discharge Plan and Services         Expected Discharge Date: 08/22/24                                     Social Drivers of Health (SDOH) Interventions SDOH Screenings   Food Insecurity: No Food Insecurity (08/18/2024)  Housing: Unknown (08/18/2024)  Transportation Needs: No Transportation Needs (08/18/2024)  Utilities: Not At Risk (08/18/2024)  Depression (PHQ2-9): Low Risk  (07/26/2020)  Recent Concern: Depression (PHQ2-9) - Medium Risk (05/23/2020)  Financial Resource Strain: Low Risk  (07/18/2024)   Received from [facility]  Social Connections: Socially Integrated (08/18/2024)  Tobacco Use: Medium Risk (08/18/2024)  Health Literacy: Low Risk  (12/17/2021)   Received from [facility]    Readmission Risk Interventions     No data to display

## 2024-08-25 NOTE — Plan of Care (Signed)

## 2024-08-26 ENCOUNTER — Other Ambulatory Visit: Payer: Self-pay | Admitting: Internal Medicine

## 2024-08-26 DIAGNOSIS — E7800 Pure hypercholesterolemia, unspecified: Secondary | ICD-10-CM | POA: Diagnosis not present

## 2024-08-26 DIAGNOSIS — G459 Transient cerebral ischemic attack, unspecified: Secondary | ICD-10-CM | POA: Diagnosis not present

## 2024-08-26 DIAGNOSIS — I6930 Unspecified sequelae of cerebral infarction: Secondary | ICD-10-CM | POA: Diagnosis not present

## 2024-08-26 DIAGNOSIS — R471 Dysarthria and anarthria: Secondary | ICD-10-CM | POA: Diagnosis not present

## 2024-08-26 DIAGNOSIS — I6300 Cerebral infarction due to thrombosis of unspecified precerebral artery: Secondary | ICD-10-CM | POA: Diagnosis not present

## 2024-08-26 DIAGNOSIS — M6281 Muscle weakness (generalized): Secondary | ICD-10-CM | POA: Diagnosis not present

## 2024-08-26 DIAGNOSIS — I69359 Hemiplegia and hemiparesis following cerebral infarction affecting unspecified side: Secondary | ICD-10-CM | POA: Diagnosis not present

## 2024-08-26 DIAGNOSIS — K219 Gastro-esophageal reflux disease without esophagitis: Secondary | ICD-10-CM | POA: Diagnosis not present

## 2024-08-26 DIAGNOSIS — Z743 Need for continuous supervision: Secondary | ICD-10-CM | POA: Diagnosis not present

## 2024-08-26 DIAGNOSIS — I10 Essential (primary) hypertension: Secondary | ICD-10-CM | POA: Diagnosis not present

## 2024-08-26 DIAGNOSIS — N179 Acute kidney failure, unspecified: Secondary | ICD-10-CM | POA: Diagnosis not present

## 2024-08-26 DIAGNOSIS — R29810 Facial weakness: Secondary | ICD-10-CM | POA: Diagnosis not present

## 2024-08-26 DIAGNOSIS — I480 Paroxysmal atrial fibrillation: Secondary | ICD-10-CM | POA: Diagnosis not present

## 2024-08-26 DIAGNOSIS — Z741 Need for assistance with personal care: Secondary | ICD-10-CM | POA: Diagnosis not present

## 2024-08-26 MED ORDER — DABIGATRAN ETEXILATE MESYLATE 150 MG PO CAPS: 150.0000 mg | ORAL_CAPSULE | Freq: Two times a day (BID) | ORAL | 0 refills | Status: DC

## 2024-08-26 MED ORDER — MELATONIN 5 MG PO TABS: 5.0000 mg | ORAL_TABLET | Freq: Every day | ORAL | Status: DC

## 2024-08-26 MED ORDER — ENSURE PLUS HIGH PROTEIN PO LIQD: 237.0000 mL | Freq: Two times a day (BID) | ORAL | Status: DC

## 2024-08-26 NOTE — Progress Notes (Signed)
 Report called to [phone] and given to [place]

## 2024-08-26 NOTE — Progress Notes (Unsigned)
 {  Select_TRH_Note:[id number]}

## 2024-08-26 NOTE — TOC Transition Note (Signed)
 Transition of Care [facility]) - Discharge Note   Patient Details  Name: Terry [name] MRN: [id number] Date of Birth: April 08, 1951  Transition of Care [facility]) CM/SW Contact:  Marinda Cooks, RN Phone Number: 08/26/2024, 11:04 AM   Clinical Narrative:     This CM updated by covering MD pt medically cleared to dc today and has active DC order . This CM spoke with  [facility] Admission liaison .DC transportation confirmed for pt with [facility].Medical team updated . No additional DC needs requested by medical team or identified by CM at this time .    Final next level of care: Skilled Nursing Facility Barriers to Discharge: No Barriers Identified   Patient Goals and CMS Choice Patient states their goals for this hospitalization and ongoing recovery are:: To recieve Rehab at [email or web address] Compare Post Acute Care list provided to:: Patient Choice offered to / list presented to : Patient      Discharge Placement              Patient chooses bed at: [facility]   Name of family member notified: Patient Patient and family notified of of transfer: 08/26/24  Discharge Plan and Services Additional resources added to the After Visit Summary for                                       Social Drivers of Health (SDOH) Interventions SDOH Screenings   Food Insecurity: No Food Insecurity (08/18/2024)  Housing: Unknown (08/18/2024)  Transportation Needs: No Transportation Needs (08/18/2024)  Utilities: Not At Risk (08/18/2024)  Depression (PHQ2-9): Low Risk  (07/26/2020)  Recent Concern: Depression (PHQ2-9) - Medium Risk (05/23/2020)  Financial Resource Strain: Low Risk  (07/18/2024)   Received from [facility]  Social Connections: Socially Integrated (08/18/2024)  Tobacco Use: Medium Risk (08/18/2024)  Health Literacy: Low Risk  (12/17/2021)   Received from [facility]     Readmission Risk Interventions     No data to display

## 2024-08-26 NOTE — Progress Notes (Signed)
 Tried to call report for patient at the number  [phone] with no answer. Will try again later.

## 2024-08-29 ENCOUNTER — Non-Acute Institutional Stay (SKILLED_NURSING_FACILITY): Payer: Self-pay | Admitting: Nurse Practitioner

## 2024-08-29 ENCOUNTER — Encounter: Payer: Self-pay | Admitting: Nurse Practitioner

## 2024-08-29 DIAGNOSIS — K219 Gastro-esophageal reflux disease without esophagitis: Secondary | ICD-10-CM | POA: Diagnosis not present

## 2024-08-29 DIAGNOSIS — I480 Paroxysmal atrial fibrillation: Secondary | ICD-10-CM

## 2024-08-29 DIAGNOSIS — E7800 Pure hypercholesterolemia, unspecified: Secondary | ICD-10-CM

## 2024-08-29 DIAGNOSIS — I69359 Hemiplegia and hemiparesis following cerebral infarction affecting unspecified side: Secondary | ICD-10-CM

## 2024-08-29 DIAGNOSIS — I10 Essential (primary) hypertension: Secondary | ICD-10-CM

## 2024-08-29 NOTE — Progress Notes (Unsigned)
 Location:  Other [facility].  Nursing Home Room Number: [facility] [id number] Place of Service:  SNF [phone]) Harlene An, NP  PCP: [name], MD  Patient Care Team: [name], MD as PCP - General (Internal Medicine)  Extended Emergency Contact Information Primary Emergency Contact: Glotfelty,[name] Address: [address]          [place], [place] [address] [place] Home Phone: [phone] Relation: Spouse Secondary Emergency Contact: Benak,[name]  [place] Home Phone: [phone] Relation: Son  Goals of care: Advanced Directive information    08/18/2024    3:23 AM  Advanced Directives  Does Patient Have a Medical Advance Directive? No  Would patient like information on creating a medical advance directive? No - Patient declined     Chief Complaint  Patient presents with   Hospitalization Follow-up    Hospital Follow up    HPI:  Pt is a 73 y.o. male seen today for Hospital Follow up. Pt with hx of MVP, HLD, a fib, multiple strokes who went to the hospital due to right sided weakness noted to have another CVA. Pts anticoagulant was changed to pradaxa  due to multiple strokes. Prior to stroke had no weakness on right side but now left with right sided hemiplagia.  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  He is at [facility] for ongoing therapy Noted to have AKI during hospitalization but Cr improved  A fib is rate controlled. He also has hx of mitral prolapse s/p bovine mitral valve. He is followed by cardiology.  He states he is doing well at this time.  Reports    Past Medical History:  Diagnosis Date   Diverticulosis    GERD (gastroesophageal reflux disease)    Heart murmur    Hypercholesteremia    Hypertension    Mitral valve prolapse    Stroke [facility])    Past Surgical History:  Procedure Laterality Date   COLONOSCOPY WITH PROPOFOL  N/A 03/06/2020   Procedure: COLONOSCOPY WITH PROPOFOL ;  Surgeon: Toledo,  Ladell POUR, MD;  Location: [facility] ENDOSCOPY;  Service: Gastroenterology;  Laterality: N/A;   MITRAL VALVE REPLACEMENT     RETINAL DETACHMENT SURGERY     RIGHT/LEFT HEART CATH AND CORONARY ANGIOGRAPHY Bilateral 02/18/2017   Procedure: Right/Left Heart Cath and Coronary Angiography;  Surgeon: Marsa Dooms, MD;  Location: [facility] INVASIVE CV LAB;  Service: Cardiovascular;  Laterality: Bilateral;   RIGHT/LEFT HEART CATH AND CORONARY ANGIOGRAPHY N/A 02/22/2020   Procedure: RIGHT/LEFT HEART CATH AND CORONARY ANGIOGRAPHY;  Surgeon: Dooms Marsa, MD;  Location: [facility] INVASIVE CV LAB;  Service: Cardiovascular;  Laterality: N/A;   TEMPOROMANDIBULAR JOINT SURGERY      Allergies  Allergen Reactions   Penicillins Shortness Of Breath    Did it involve swelling of the face/tongue/throat, SOB, or low BP? Yes Did it involve sudden or severe rash/hives, skin peeling, or any reaction on the inside of your mouth or nose? Unknown Did you need to seek medical attention at a hospital or doctor's office? Was at a clinic when reaction occurred When did it last happen? More than 50 years ago If all above answers are "NO", may proceed with cephalosporin use.    Ciprofloxacin Swelling and Rash   Ivp Dye [Iodinated Contrast Media] Rash    Outpatient Encounter Medications as of 08/29/2024  Medication Sig   Apoaequorin (PREVAGEN) 10 MG CAPS Take 1 capsule by mouth daily.   aspirin  EC 81 MG EC tablet Take 1  tablet (81 mg total) by mouth daily.   atorvastatin  (LIPITOR) 80 MG tablet Take 1 tablet (80 mg total) by mouth daily.   clobetasol  ointment (TEMOVATE ) 0.05 % Apply 1 application topically daily as needed (skin irritation).    dabigatran  (PRADAXA ) 150 MG CAPS capsule Take 1 capsule (150 mg total) by mouth every 12 (twelve) hours.   feeding supplement (ENSURE PLUS HIGH PROTEIN) LIQD Take 237 mLs by mouth 2 (two) times daily between meals.   melatonin 5 MG TABS Take 1 tablet (5 mg total) by mouth at bedtime.    Multiple Vitamin (MULTIVITAMIN WITH MINERALS) TABS tablet Take 1 tablet by mouth at bedtime.   nortriptyline (PAMELOR) 50 MG capsule Take 50 mg by mouth at bedtime.   omeprazole (PRILOSEC) 20 MG capsule Take 20 mg by mouth every evening.    valACYclovir  (VALTREX ) 500 MG tablet Take 500 mg by mouth at bedtime.   No facility-administered encounter medications on file as of 08/29/2024.    Review of Systems ***  Immunization History  Administered Date(s) Administered   INFLUENZA, HIGH DOSE SEASONAL PF 09/20/2017, 09/30/2019   Influenza Inj Mdck Quad Pf 10/12/2018, 09/29/2021   Influenza Whole 10/13/2017   Influenza-Unspecified 10/13/2017, 10/16/2022   Moderna Sars-Covid-2 Vaccination 01/22/2020, 02/19/2020   Pneumococcal Polysaccharide-23 04/13/2015   Pertinent  Health Maintenance Due  Topic Date Due   INFLUENZA VACCINE  07/28/2024   Colonoscopy  03/06/2030      04/01/2021    3:23 PM 04/01/2021   11:00 PM 04/02/2021    8:00 AM 04/02/2021    9:15 PM 04/03/2021    8:00 AM  Fall Risk  (RETIRED) Patient Fall Risk Level Low fall risk  Low fall risk  Moderate fall risk  Low fall risk  Low fall risk      Data saved with a previous flowsheet row definition   Functional Status Survey:    Vitals:   08/29/24 0920  BP: 100/67  Pulse: 83  Resp: 18  Temp: (!) 97.1 F (36.2 C)  SpO2: 97%  Weight: 173 lb 6.4 oz (78.7 kg)  Height: 5' 7 (1.702 m)   Body mass index is 27.16 kg/m. Physical Exam***  Labs reviewed: Recent Labs    08/18/24 0341 08/19/24 0614 08/22/24 0428 08/23/24 0411 08/24/24 0542  NA  --    < > 141 140 137  K  --    < > 3.9 4.0 3.8  CL  --    < > 106 105 104  CO2  --    < > 26 26 25   GLUCOSE  --    < > 104* 106* 113*  BUN  --    < > 34* 36* 34*  CREATININE  --    < > 1.20 1.28* 1.24  CALCIUM   --    < > 9.7 9.5 9.3  MG 1.9  --   --   --   --    < > = values in this interval not displayed.   Recent Labs    08/18/24 0321  AST 24  ALT 20  ALKPHOS 80  BILITOT  0.8  PROT 6.8  ALBUMIN 3.9   Recent Labs    08/18/24 0321 08/20/24 0603 08/22/24 0428 08/23/24 0411 08/24/24 0542  WBC 7.8   < > 7.3 7.8 7.7  NEUTROABS 5.9  --   --   --   --   HGB 13.7   < > 15.5 15.2 14.8  HCT 39.3   < >  44.4 44.0 42.2  MCV 99.2   < > 98.7 98.9 96.8  PLT 176   < > 231 251 230   < > = values in this interval not displayed.   Lab Results  Component Value Date   TSH 3.956 08/18/2024   Lab Results  Component Value Date   HGBA1C 5.3 08/18/2024   Lab Results  Component Value Date   CHOL 120 08/19/2024   HDL 30 (L) 08/19/2024   LDLCALC 71 08/19/2024   TRIG 96 08/19/2024   CHOLHDL 4.0 08/19/2024    Significant Diagnostic Results in last 30 days:  ECHOCARDIOGRAM COMPLETE Result Date: 08/18/2024    ECHOCARDIOGRAM REPORT   Patient Name:   MALIQUE DRISKILL Date of Exam: 08/18/2024 Medical Rec #:  [id number]    Height:       67.0 in Accession #:    [id number]   Weight:       180.8 lb Date of Birth:  1951/01/14   BSA:          1.937 m Patient Age:    72 years     BP:           120/70 mmHg Patient Gender: M            HR:           87 bpm. Exam Location:  [facility] Procedure: 2D Echo, Cardiac Doppler, Color Doppler and Intracardiac            Opacification Agent (Both Spectral and Color Flow Doppler were            utilized during procedure). Indications:     Stroke I63.9  History:         Patient has prior history of Echocardiogram examinations, most                  recent 04/02/2021. Stroke; Mitral Valve Disease.  Sonographer:     Rosina Dunk Referring Phys:  [id number] CORT ONEIDA MANA Diagnosing Phys: Cara JONETTA Lovelace MD  Sonographer Comments: Technically difficult study due to poor echo windows. IMPRESSIONS  1. TDS.  2. Left ventricular ejection fraction, by estimation, is 40 to 45%. The left ventricle has mildly decreased function. The left ventricle demonstrates global hypokinesis. The left ventricular internal cavity size was mildly dilated. There is moderate asymmetric  left ventricular hypertrophy of the inferior segment. Left ventricular diastolic parameters are consistent with Grade I diastolic dysfunction (impaired relaxation).  3. Right ventricular systolic function is normal. The right ventricular size is normal.  4. The mitral valve has been repaired/replaced. Mild mitral valve regurgitation. Moderate to severe mitral stenosis.  5. The aortic valve is calcified. Aortic valve regurgitation is trivial. Moderate aortic valve stenosis. Conclusion(s)/Recommendation(s): Poor windows for evaluation of left ventricular function by transthoracic echocardiography. Would recommend an alternative means of evaluation. FINDINGS  Left Ventricle: Left ventricular ejection fraction, by estimation, is 40 to 45%. The left ventricle has mildly decreased function. The left ventricle demonstrates global hypokinesis. Definity  contrast agent was given IV to delineate the left ventricular  endocardial borders. Strain was performed and the global longitudinal strain is indeterminate. The left ventricular internal cavity size was mildly dilated. There is moderate asymmetric left ventricular hypertrophy of the inferior segment. Left ventricular diastolic parameters are consistent with Grade I diastolic dysfunction (impaired relaxation). Right Ventricle: The right ventricular size is normal. No increase in right ventricular wall thickness. Right ventricular systolic function is normal. Left Atrium: Left atrial size was normal  in size. Right Atrium: Right atrial size was normal in size. Pericardium: There is no evidence of pericardial effusion. Mitral Valve: The mitral valve has been repaired/replaced. Mild mitral valve regurgitation. There is a bioprosthetic valve present in the mitral position. Moderate to severe mitral valve stenosis. MV peak gradient, 21.7 mmHg. The mean mitral valve gradient is 12.0 mmHg. Tricuspid Valve: The tricuspid valve is normal in structure. Tricuspid valve regurgitation is  trivial. Aortic Valve: The aortic valve is calcified. Aortic valve regurgitation is trivial. Moderate aortic stenosis is present. Aortic valve mean gradient measures 12.0 mmHg. Aortic valve peak gradient measures 22.3 mmHg. Aortic valve area, by VTI measures 1.02  cm. Pulmonic Valve: The pulmonic valve was grossly normal. Pulmonic valve regurgitation is trivial. Aorta: The ascending aorta was not well visualized. IAS/Shunts: No atrial level shunt detected by color flow Doppler. Additional Comments: TDS. 3D was performed not requiring image post processing on an independent workstation and was indeterminate.  LEFT VENTRICLE PLAX 2D LVIDd:         5.30 cm LVIDs:         4.80 cm LV PW:         1.40 cm LV IVS:        1.00 cm LVOT diam:     2.30 cm LV SV:         38 LV SV Index:   20 LVOT Area:     4.15 cm  RIGHT VENTRICLE RV S prime:     5.11 cm/s TAPSE (M-mode): 0.8 cm AORTIC VALVE                     PULMONIC VALVE AV Area (Vmax):    0.99 cm      PV Vmax:        1.12 m/s AV Area (Vmean):   0.92 cm      PV Vmean:       78.300 cm/s AV Area (VTI):     1.02 cm      PV VTI:         0.196 m AV Vmax:           236.33 cm/s   PV Peak grad:   5.0 mmHg AV Vmean:          160.000 cm/s  PV Mean grad:   3.0 mmHg AV VTI:            0.376 m       RVOT Peak grad: 4 mmHg AV Peak Grad:      22.3 mmHg AV Mean Grad:      12.0 mmHg LVOT Vmax:         56.10 cm/s LVOT Vmean:        35.600 cm/s LVOT VTI:          0.092 m LVOT/AV VTI ratio: 0.25  AORTA Ao Root diam: 3.80 cm Ao Asc diam:  3.00 cm MITRAL VALVE                TRICUSPID VALVE MV Area (PHT): 4.60 cm     TR Peak grad:   21.9 mmHg MV Area VTI:   0.93 cm     TR Mean grad:   13.0 mmHg MV Peak grad:  21.7 mmHg    TR Vmax:        234.00 cm/s MV Mean grad:  12.0 mmHg    TR Vmean:       169.0 cm/s MV Vmax:  2.33 m/s MV Vmean:      172.0 cm/s   SHUNTS MV Decel Time: 165 msec     Systemic VTI:  0.09 m MV E velocity: 133.00 cm/s  Systemic Diam: 2.30 cm MV A velocity: 197.00 cm/s   Pulmonic VTI:  0.167 m MV E/A ratio:  0.68 Terry JONETTA Lovelace MD Electronically signed by Cara JONETTA Lovelace MD Signature Date/Time: 08/18/2024/3:06:23 PM    Final    CT Head Wo Contrast Result Date: 08/18/2024 CLINICAL DATA:  73 year old male with fall and neurologic deficits. Left posterior corona radiata/basal ganglia lacunar infarct on MRI this morning. Worsening neurologic symptoms. EXAM: CT HEAD WITHOUT CONTRAST TECHNIQUE: Contiguous axial images were obtained from the base of the skull through the vertex without intravenous contrast. RADIATION DOSE REDUCTION: This exam was performed according to the departmental dose-optimization program which includes automated exposure control, adjustment of the mA and/or kV according to patient size and/or use of iterative reconstruction technique. COMPARISON:  Brain MRI 08/18/2024, head CT 0754 hours today. FINDINGS: Brain: Hypodensity posterior left corona radiata tracking to the lentiform which has progressed since 0343 hours today on CT (series 2, image 13), and probably is slightly larger since than the DWI abnormality at 0541 hours today (compare to series 5, image 27 at that time). No hemorrhagic transformation. No mass effect. Stable gray-white differentiation elsewhere including chronic left thalamic lacunar infarct, chronic left operculum infarct. No intracranial mass effect or ventriculomegaly. Vascular: Residual intravascular contrast from CTA this morning. Skull: Stable and intact. Sinuses/Orbits: Visualized paranasal sinuses and mastoids are stable and well aerated. Other: No acute orbit or scalp soft tissue finding. IMPRESSION: 1. Evidence of mild extension of the left deep white and deep gray matter lacunar infarct since DWI MRI 0544 hours today. No hemorrhagic transformation or mass effect. 2. No new intracranial abnormality identified. Background chronic ischemic disease. Electronically Signed   By: VEAR Hurst M.D.   On: 08/18/2024 12:46   US  RENAL Result  Date: 08/18/2024 CLINICAL DATA:  Acute renal injury EXAM: RENAL / URINARY TRACT ULTRASOUND COMPLETE COMPARISON:  None Available. FINDINGS: Right Kidney: Renal measurements: 9.9 x 4.2 x 5.1 = volume: 110 mL. Echogenicity within normal limits. No mass or hydronephrosis visualized. Left Kidney: Renal measurements: 10.2 x 5.7 x 5.4 = volume: 167 mL. Mild LEFT hydronephrosis. 2 cm anechoic cyst in lower pole. Bladder: Appears normal for degree of bladder distention. Bilateral ureteral jets identified. Other: None. IMPRESSION: 1. Mild LEFT hydronephrosis. 2. Benign LEFT renal cyst. 3. Normal RIGHT kidney. 4. Bilateral ureteral jets identified. Electronically Signed   By: Jackquline Boxer M.D.   On: 08/18/2024 11:16   CT Soft Tissue Neck W Contrast Result Date: 08/18/2024 CLINICAL DATA:  Anterior neck swelling and throat pain EXAM: CT NECK WITH CONTRAST TECHNIQUE: Multidetector CT imaging of the neck was performed using the standard protocol following the bolus administration of intravenous contrast. RADIATION DOSE REDUCTION: This exam was performed according to the departmental dose-optimization program which includes automated exposure control, adjustment of the mA and/or kV according to patient size and/or use of iterative reconstruction technique. CONTRAST:  75mL OMNIPAQUE  IOHEXOL  350 MG/ML SOLN COMPARISON:  CT angiography same day FINDINGS: Pharynx and larynx: This examination suffers from some motion degradation. No evidence of mucosal or submucosal mass or inflammatory disease. Salivary glands: Parotid and submandibular glands are normal. Thyroid: Normal. Lymph nodes: No lymphadenopathy on either side of the neck. Normal cervical chain nodes. Vascular: Atherosclerotic calcification at the carotid bifurcations as evaluated by CT  angiography. Limited intracranial: Negative Visualized orbits: Normal Mastoids and visualized paranasal sinuses: Clear Skeleton: Ordinary spondylosis and facet arthritis. Upper chest:  Clear Other: No evidence of superficial soft tissue edema, inflammation or mass. No abnormality seen to explain the clinical concern. IMPRESSION: 1. No abnormality seen to explain the clinical concern. No evidence of mass or lymphadenopathy. 2. Atherosclerotic calcification at the carotid bifurcations as evaluated by CT angiography. 3. Ordinary spondylosis and facet arthritis of the cervical spine. Electronically Signed   By: Oneil Officer M.D.   On: 08/18/2024 08:20   CT ANGIO HEAD NECK W WO CM Result Date: 08/18/2024 CLINICAL DATA:  Neuro deficit, acute, stroke suspected. Anterior neck swelling and throat pain. Fall. Acute small vessel infarction of the left brain. EXAM: CT ANGIOGRAPHY HEAD AND NECK WITH AND WITHOUT CONTRAST TECHNIQUE: Multidetector CT imaging of the head and neck was performed using the standard protocol during bolus administration of intravenous contrast. Multiplanar CT image reconstructions and MIPs were obtained to evaluate the vascular anatomy. Carotid stenosis measurements (when applicable) are obtained utilizing NASCET criteria, using the distal internal carotid diameter as the denominator. RADIATION DOSE REDUCTION: This exam was performed according to the departmental dose-optimization program which includes automated exposure control, adjustment of the mA and/or kV according to patient size and/or use of iterative reconstruction technique. CONTRAST:  75mL OMNIPAQUE  IOHEXOL  350 MG/ML SOLN COMPARISON:  Head CT and brain MRI earlier same day FINDINGS: CT HEAD FINDINGS Brain: No focal abnormality seen affecting the brainstem or cerebellum. Old infarction in the left frontal operculum. Acute infarction within the left external capsule and body of the caudate visible as subtle low density. No evidence of hemorrhagic transformation or mass effect. No hydrocephalus. No extra-axial collection. Vascular: There is atherosclerotic calcification of the major vessels at the base of the brain. Skull:  Negative Sinuses/Orbits: Clear/normal Other: None Review of the MIP images confirms the above findings CTA NECK FINDINGS Aortic arch: Aortic atherosclerosis. Branching pattern is normal without origin stenosis. Right carotid system: Common carotid artery widely patent to the bifurcation. Calcified plaque at the carotid bifurcation and ICA bulb but no stenosis when compared to the more distal cervical ICA diameter. Left carotid system: Common carotid artery widely patent to the bifurcation. Calcified plaque at the carotid bifurcation and ICA bulb but no stenosis. Cervical ICA widely patent beyond that Vertebral arteries: No proximal subclavian stenosis. Both vertebral artery origins are widely patent. Calcified plaque adjacent to the right vertebral artery origin. Both vertebral arteries are patent through the cervical region without stenosis. Skeleton: Ordinary spondylosis C5-6. Mild facet osteoarthritis right worse than left. Other neck: See results of neck CT. Upper chest: Minimal emphysema in the upper lobes. Review of the MIP images confirms the above findings CTA HEAD FINDINGS Anterior circulation: Both internal carotid arteries are patent through the skull base and siphon regions. Mild siphon atherosclerotic calcification but no stenosis. The anterior and middle cerebral vessels are patent. No large vessel occlusion or proximal stenosis. Some motion degradation towards the vertex. Posterior circulation: Both vertebral arteries are patent through the foramen magnum to the basilar artery. No basilar stenosis. Posterior circulation branch vessels are patent. Venous sinuses: Patent and normal. Anatomic variants: None significant. Review of the MIP images confirms the above findings IMPRESSION: 1. No intracranial large vessel occlusion or proximal stenosis. 2. Acute infarction in the left external capsule and body of the caudate visible as subtle low density. No evidence of hemorrhagic transformation or mass  effect. 3. Old infarction in the left  frontal operculum. 4. Atherosclerotic calcification at the carotid bifurcations and ICA bulbs but no stenosis. Aortic Atherosclerosis (ICD10-I70.0). Electronically Signed   By: Oneil Officer M.D.   On: 08/18/2024 08:17   MR BRAIN WO CONTRAST Result Date: 08/18/2024 CLINICAL DATA:  73 year old male status post fall. Neurologic deficit. EXAM: MRI HEAD WITHOUT CONTRAST TECHNIQUE: Multiplanar, multiecho pulse sequences of the brain and surrounding structures were obtained without intravenous contrast. COMPARISON:  Head CT 0343 hours today.  Brain MRI 04/01/2021. FINDINGS: Brain: Patchy chronic left MCA middle division cortical encephalomalacia, primarily affecting the operculum, stable since 2022. Chronic lacunar infarcts in the bilateral caudate nuclei are stable since 2022. Evolution of left thalamic lacunar infarct since the previous MRI. Multiple small bilateral cerebellar infarcts are new or increased since 2022. small chronic right inferior parietal lobe cortical encephalomalacia is stable. Patchy acute lacunar type infarct tracking from the posterior left corona radiata into the left lentiform (series 5 images 31-27. mild T2 and FLAIR hyperintensity. No hemorrhage or mass effect. No other diffusion restriction. No midline shift, mass effect, evidence of mass lesion, ventriculomegaly, extra-axial collection or acute intracranial hemorrhage. Cervicomedullary junction and pituitary are within normal limits. Vascular: Major intracranial vascular flow voids are stable since 2022. Skull and upper cervical spine: Chronic lacunar infarcts in the bilateral caudate nuclei are stable since 2022. Evolution of left thalamic lacunar infarct since the previous MRI. Small bilateral cerebellar infarcts are new or increased since 2022. Patchy acute lacunar type infarct tracking from the posterior left corona radiata into the left lentiform (series 5 images 31-27. Sinuses/Orbits: Negative.  Visualized bone marrow signal is within normal limits. Other: Stable, negative. IMPRESSION: 1. Acute lacunar infarct tracking from the posterior left corona radiata into the left lentiform. No associated hemorrhage or mass effect. 2. Underlying chronic small and medium-sized vessel ischemia, including a previous left MCA territory operculum infarct. 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 Electronically Signed   By: VEAR Hurst M.D.   On: 08/18/2024 06:51   CT HEAD WO CONTRAST ( ) Result Date: 08/18/2024 CLINICAL DATA:  Neuro deficit. Fell from standing. Right-side feels funny. Weakness. EXAM: CT HEAD WITHOUT CONTRAST TECHNIQUE: Contiguous axial images were obtained from the base of the skull through the vertex without intravenous contrast. RADIATION DOSE REDUCTION: This exam was performed according to the departmental dose-optimization program which includes automated exposure control, adjustment of the mA and/or kV according to patient size and/or use of iterative reconstruction technique. COMPARISON:  CT 04/01/2021 FINDINGS: Brain: No intracranial hemorrhage, mass effect, or evidence of acute infarct. No hydrocephalus. No extra-axial fluid collection. Chronic left frontal infarct. Vascular: No hyperdense vessel or unexpected calcification. Skull: No fracture or focal lesion. Sinuses/Orbits: No acute finding. Other: None. IMPRESSION: No acute intracranial abnormality. Electronically Signed   By: Norman Gatlin M.D.   On: 08/18/2024 03:56    Assessment/Plan No problem-specific Assessment & Plan notes found for this encounter.     Saraya Tirey K. Caro [facility] [phone]

## 2024-09-20 ENCOUNTER — Encounter: Payer: Self-pay | Admitting: Internal Medicine

## 2024-09-20 ENCOUNTER — Non-Acute Institutional Stay (SKILLED_NURSING_FACILITY): Payer: Self-pay | Admitting: Internal Medicine

## 2024-09-20 DIAGNOSIS — I6930 Unspecified sequelae of cerebral infarction: Secondary | ICD-10-CM | POA: Diagnosis not present

## 2024-09-20 DIAGNOSIS — I480 Paroxysmal atrial fibrillation: Secondary | ICD-10-CM | POA: Diagnosis not present

## 2024-09-20 DIAGNOSIS — N179 Acute kidney failure, unspecified: Secondary | ICD-10-CM | POA: Diagnosis not present

## 2024-09-20 NOTE — Progress Notes (Unsigned)
 NURSING HOME LOCATION:  [facility] ROOM NUMBER:  107A  CODE STATUS:  Full Code  PCP:  Norleen CHARM Rower MD  This is a comprehensive admission note to this SNFperformed on this date less than 30 days from date of admission. Included are preadmission medical/surgical history; reconciled medication list; family history; social history and comprehensive review of systems.  Corrections and additions to the records were documented. Comprehensive physical exam was also performed. Additionally a clinical summary was entered for each active diagnosis pertinent to this admission in the Problem List to enhance continuity of care.  HPI: He was hospitalized 8/22 - 08/26/2024 with an acute cerebrovascular accident.  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  This was in the context of PAF for which she is on Eliquis . Presentation was as mild weakness in the right hand with apparent paresthesias of the right upper extremity.  The morning of admission he woke up at approximately 2:30 AM with significant weakness of the right arm and right leg. MRI revealed acute right lacunar stroke in the right corona radiata.  CTA was negative for LVO. Initial GFR was 47 but it remained essentially stable thereafter with values of greater than 60, indicating CKD stage II.  Peak creatinine was 1.28 with a final value of 1.24. Mild prerenal azotemia was present with a peak BUN of 36.  TSH was therapeutic at 3.956 While hospitalized mild hyperglycemia was noted with glucoses ranging from the level of 100 up to high 122.  CBC was essentially normal. On 8/27 CIR was denied and he was discharged as per PT/OT recommendations to SNF: Rehab.  Past medical and surgical history: Includes history of diverticulosis; GERD; dyslipidemia; essential hypertension; mitral valve prolapse; and history of stroke. Surgeries and procedures include colonoscopy;  mitral valve replacement; coronary angiography; and TMJ surgery.  Family history: reviewed, non contributory due to advanced age.  Social history: Occasional alcohol  intake; former 30-pack-year smoker.  He is retired from [employer] in which he had various positions.  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   Review of systems: He is intelligent and gives an excellent history demonstrating comprehension of the acute event.  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  He states he is here for rehab 5 days a week and is hopeful for at least a 70% improvement.  He stated that he was on baby aspirin  and Eliquis  prior to the stroke and was compliant with the medications. He continues to have right sided weakness and intermittent numbness and tingling on the right.  There has been some blurring of the vision of stroke.  He also has occasional dysphagia if he does not take small bites and chew well.  He validates dyspepsia without other GI symptoms. He reads extensively and has a sense of humor.  He states that he has a pig valve; he just had that he hesitates to drive by [facility] because he might interact with some of his relatives.  He states that he has been told he snores but there is no apnea.  As far as anxiety depression he states it comes somewhat with having a stroke.  Constitutional: No fever, significant weight change, fatigue  Eyes: No redness, discharge, pain, vision change ENT/mouth: No nasal congestion, purulent discharge,  earache, change in hearing, sore throat  Cardiovascular: No chest pain, palpitations, paroxysmal nocturnal dyspnea, claudication, edema  Respiratory: No cough, sputum production, hemoptysis, DOE, significant snoring, apnea Gastrointestinal: No heartburn, dysphagia, abdominal pain, nausea /vomiting, rectal bleeding, melena, change in  bowels Genitourinary: No dysuria, hematuria, pyuria, incontinence, nocturia Musculoskeletal: No joint stiffness, joint swelling, weakness, pain Dermatologic: No rash, pruritus, change in appearance of skin Neurologic: No dizziness, headache, syncope, seizures, numbness, tingling Psychiatric: No significant anxiety, depression, insomnia, anorexia Endocrine: No change in hair/skin/nails, excessive thirst, excessive hunger, excessive urination  Hematologic/lymphatic: No significant bruising, lymphadenopathy, abnormal bleeding Allergy/immunology: No itchy/watery eyes, significant sneezing, urticaria, angioedema  Physical exam:  Pertinent or positive findings: Pattern alopecia is present.  There appears to be slight asymmetry of the nasolabial folds with possible sagging of the right side of the mouth.  It is difficult to discern concretely as he has a mustache.  He also has small goatee.  Speech exhibits minimal dysarthria and has a hyponasal pattern.  Eyebrows are decreased.  He has complete dentures.  There is a variability in the strength of the pulse and this causes oscillatory suggestion of irregular heart rhythm.  Grade 1 systolic murmur is noted.  Left carotid bruit is present.  Pedal pulses are decreased.  He has 1/2+ edema at the sock line.  He is very weak in the right upper extremity and right lower extremity.  Strength on the left is good. General appearance: Adequately nourished; no acute distress, increased work of breathing is present.   Lymphatic: No lymphadenopathy about the head, neck, axilla. Eyes: No conjunctival inflammation or lid edema is present. There is no scleral icterus. Ears:  External ear exam shows no significant lesions or deformities.   Nose:  External nasal examination shows no deformity or inflammation. Nasal mucosa are pink and moist without lesions, exudates Oral exam: Lips and gums are healthy appearing.There is no oropharyngeal erythema or exudate. Neck:  No  thyromegaly, masses, tenderness noted.    Heart:  Normal rate and regular rhythm. S1 and S2 normal without gallop, murmur, click, rub.  Lungs: Chest clear to auscultation without wheezes, rhonchi, rales, rubs. Abdomen: Bowel sounds are normal.  Abdomen is soft and nontender with no organomegaly, hernias, masses. GU: Deferred  Extremities:  No cyanosis, clubbing, edema. Neurologic exam:  Strength equal  in upper & lower extremities. Balance, Rhomberg, finger to nose testing could not be completed due to clinical state Deep tendon reflexes are equal Skin: Warm & dry w/o tenting. No significant lesions or rash.  See clinical summary under each active problem in the Problem List with associated updated therapeutic plan

## 2024-09-20 NOTE — Patient Instructions (Signed)
 See assessment and plan under each diagnosis in the problem list and acutely for this visit

## 2024-09-22 DIAGNOSIS — I6930 Unspecified sequelae of cerebral infarction: Secondary | ICD-10-CM | POA: Insufficient documentation

## 2024-09-22 NOTE — Assessment & Plan Note (Addendum)
 Pulse strength is variable causing suggestion of irregular rhythm. 08/18/24 EKG revealed SR with multiform PVCs. Continue Pradaxa .

## 2024-09-22 NOTE — Assessment & Plan Note (Signed)
 He is progressing in PT/OT . He hopes for @ least 70% improvement.

## 2024-09-22 NOTE — Assessment & Plan Note (Signed)
 Nadir GFR was 47 in context of prerenal azotemia with BUN of 36. Current renal function is stable CKD Stage 2 with GFR > 60 & creatinine of 1.24.

## 2024-09-27 ENCOUNTER — Non-Acute Institutional Stay (SKILLED_NURSING_FACILITY): Payer: Self-pay | Admitting: Adult Health

## 2024-09-27 ENCOUNTER — Encounter: Payer: Self-pay | Admitting: Adult Health

## 2024-09-27 DIAGNOSIS — R001 Bradycardia, unspecified: Secondary | ICD-10-CM

## 2024-09-27 DIAGNOSIS — I480 Paroxysmal atrial fibrillation: Secondary | ICD-10-CM | POA: Diagnosis not present

## 2024-09-27 DIAGNOSIS — I69359 Hemiplegia and hemiparesis following cerebral infarction affecting unspecified side: Secondary | ICD-10-CM

## 2024-09-27 NOTE — Progress Notes (Signed)
 Location:  Other ([facility] Room Number: 107 A Place of Service:  SNF ([phone]) Provider:  Medina-Vargas, Lagina Reader, DNP, FNP-BC  Patient Care Team: [name], MD as PCP - General (Internal Medicine)  Extended Emergency Contact Information Primary Emergency Contact: Snooks,[name] Address: [address]          [place], [place] [address] [place] Home Phone: [phone] Relation: Spouse Secondary Emergency Contact: Brodowski,[name]  [place] Home Phone: [phone] Relation: Son  Code Status:   Full Code  Goals of care: Advanced Directive information    09/20/2024   11:38 AM  Advanced Directives  Does Patient Have a Medical Advance Directive? No  Would patient like information on creating a medical advance directive? No - Patient declined     No chief complaint on file.   HPI:  Pt is a 73 y.o. male seen today for an acute visit for low heart rate. She is a resident of [facility]. He was reported to have HR 50 last night. He denied having chest pain nor shortness of breath.   Patient has diagnosis of paroxysmal atrial fibrillation and currently on Pradaxa  for anticoagulation but not on any rate-controlling medicine.   He has history of CVA with right hemiplegia. He takes Lipitor 80 mg daily, ASA 81 mg daily and Pradaxa  150 mg BID.   Past Medical History:  Diagnosis Date   Diverticulosis    GERD (gastroesophageal reflux disease)    Heart murmur    Hypercholesteremia    Hypertension    Mitral valve prolapse    Stroke [facility])    Past Surgical History:  Procedure Laterality Date   COLONOSCOPY WITH PROPOFOL  N/A 03/06/2020   Procedure: COLONOSCOPY WITH PROPOFOL ;  Surgeon: Toledo, Ladell POUR, MD;  Location: [facility] ENDOSCOPY;  Service: Gastroenterology;  Laterality: N/A;   MITRAL VALVE REPLACEMENT     RETINAL DETACHMENT SURGERY     RIGHT/LEFT HEART CATH AND CORONARY ANGIOGRAPHY Bilateral 02/18/2017    Procedure: Right/Left Heart Cath and Coronary Angiography;  Surgeon: Marsa Dooms, MD;  Location: [facility] INVASIVE CV LAB;  Service: Cardiovascular;  Laterality: Bilateral;   RIGHT/LEFT HEART CATH AND CORONARY ANGIOGRAPHY N/A 02/22/2020   Procedure: RIGHT/LEFT HEART CATH AND CORONARY ANGIOGRAPHY;  Surgeon: Dooms Marsa, MD;  Location: [facility] INVASIVE CV LAB;  Service: Cardiovascular;  Laterality: N/A;   TEMPOROMANDIBULAR JOINT SURGERY      Allergies  Allergen Reactions   Penicillins Shortness Of Breath    Did it involve swelling of the face/tongue/throat, SOB, or low BP? Yes Did it involve sudden or severe rash/hives, skin peeling, or any reaction on the inside of your mouth or nose? Unknown Did you need to seek medical attention at a hospital or doctor's office? Was at a clinic when reaction occurred When did it last happen? More than 50 years ago If all above answers are "NO", may proceed with cephalosporin use.    Ciprofloxacin Swelling and Rash   Ivp Dye [Iodinated Contrast Media] Rash    Outpatient Encounter Medications as of 09/27/2024  Medication Sig   Apoaequorin (PREVAGEN) 10 MG CAPS Take 1 capsule by mouth daily.   aspirin  EC 81 MG EC tablet Take 1 tablet (81 mg total) by mouth daily.   atorvastatin  (LIPITOR) 80 MG tablet Take 1 tablet (80 mg total) by mouth daily.   clobetasol  ointment (TEMOVATE ) 0.05 % Apply 1 application topically daily as needed (skin irritation).    dabigatran  (PRADAXA )  150 MG CAPS capsule Take 1 capsule (150 mg total) by mouth every 12 (twelve) hours.   feeding supplement (ENSURE PLUS HIGH PROTEIN) LIQD Take 237 mLs by mouth 2 (two) times daily between meals.   melatonin 5 MG TABS Take 1 tablet (5 mg total) by mouth at bedtime.   Multiple Vitamin (MULTIVITAMIN WITH MINERALS) TABS tablet Take 1 tablet by mouth at bedtime.   nortriptyline (PAMELOR) 50 MG capsule Take 50 mg by mouth at bedtime.   omeprazole (PRILOSEC) 20 MG capsule Take 20 mg by  mouth every evening.    polyethylene glycol (MIRALAX / GLYCOLAX) 17 g packet Take 17 g by mouth 2 (two) times daily.   valACYclovir  (VALTREX ) 500 MG tablet Take 500 mg by mouth at bedtime.   No facility-administered encounter medications on file as of 09/27/2024.    Review of Systems  Constitutional:  Negative for activity change, appetite change and fever.  HENT:  Negative for sore throat.   Eyes: Negative.   Cardiovascular:  Negative for chest pain and leg swelling.  Gastrointestinal:  Negative for abdominal distention, diarrhea and vomiting.  Genitourinary:  Negative for dysuria, frequency and urgency.  Skin:  Negative for color change.  Neurological:  Positive for weakness. Negative for dizziness and headaches.  Psychiatric/Behavioral:  Negative for behavioral problems and sleep disturbance. The patient is not nervous/anxious.      Immunization History  Administered Date(s) Administered   INFLUENZA, HIGH DOSE SEASONAL PF 09/20/2017, 09/30/2019   Influenza Inj Mdck Quad Pf 10/12/2018, 09/29/2021   Influenza Whole 10/13/2017   Influenza-Unspecified 10/13/2017, 10/16/2022   Moderna Sars-Covid-2 Vaccination 01/22/2020, 02/19/2020   Pneumococcal Polysaccharide-23 04/13/2015   Unspecified SARS-COV-2 Vaccination 11/15/2023   Pertinent  Health Maintenance Due  Topic Date Due   Influenza Vaccine  07/28/2024   Colonoscopy  03/06/2030      04/01/2021    3:23 PM 04/01/2021   11:00 PM 04/02/2021    8:00 AM 04/02/2021    9:15 PM 04/03/2021    8:00 AM  Fall Risk  (RETIRED) Patient Fall Risk Level Low fall risk  Low fall risk  Moderate fall risk  Low fall risk  Low fall risk      Data saved with a previous flowsheet row definition     Vitals:   09/27/24 1658  BP: 103/71  Pulse: 79  Resp: 18  Temp: (!) 97.3 F (36.3 C)  Weight: 180 lb (81.6 kg)  Height: 5' 7 (1.702 m)   Body mass index is 28.19 kg/m.  Physical Exam Constitutional:      Appearance: Normal appearance.  HENT:      Head: Normocephalic and atraumatic.     Mouth/Throat:     Mouth: Mucous membranes are moist.  Eyes:     Conjunctiva/sclera: Conjunctivae normal.  Cardiovascular:     Rate and Rhythm: Normal rate and regular rhythm.     Pulses: Normal pulses.     Heart sounds: Normal heart sounds.  Pulmonary:     Effort: Pulmonary effort is normal.     Breath sounds: Normal breath sounds.  Abdominal:     General: Bowel sounds are normal.     Palpations: Abdomen is soft.  Musculoskeletal:     Cervical back: Normal range of motion.  Skin:    General: Skin is warm and dry.  Neurological:     General: No focal deficit present.     Mental Status: He is alert and oriented to person, place, and time.  Motor: Weakness present.     Comments: Right hemiplegia, right facial droop  Psychiatric:        Mood and Affect: Mood normal.        Behavior: Behavior normal.        Thought Content: Thought content normal.        Judgment: Judgment normal.        Labs reviewed: Recent Labs    08/18/24 0341 08/19/24 0614 08/22/24 0428 08/23/24 0411 08/24/24 0542  NA  --    < > 141 140 137  K  --    < > 3.9 4.0 3.8  CL  --    < > 106 105 104  CO2  --    < > 26 26 25   GLUCOSE  --    < > 104* 106* 113*  BUN  --    < > 34* 36* 34*  CREATININE  --    < > 1.20 1.28* 1.24  CALCIUM   --    < > 9.7 9.5 9.3  MG 1.9  --   --   --   --    < > = values in this interval not displayed.   Recent Labs    08/18/24 0321  AST 24  ALT 20  ALKPHOS 80  BILITOT 0.8  PROT 6.8  ALBUMIN 3.9   Recent Labs    08/18/24 0321 08/20/24 0603 08/22/24 0428 08/23/24 0411 08/24/24 0542  WBC 7.8   < > 7.3 7.8 7.7  NEUTROABS 5.9  --   --   --   --   HGB 13.7   < > 15.5 15.2 14.8  HCT 39.3   < > 44.4 44.0 42.2  MCV 99.2   < > 98.7 98.9 96.8  PLT 176   < > 231 251 230   < > = values in this interval not displayed.   Lab Results  Component Value Date   TSH 3.956 08/18/2024   Lab Results  Component Value Date    HGBA1C 5.3 08/18/2024   Lab Results  Component Value Date   CHOL 120 08/19/2024   HDL 30 (L) 08/19/2024   LDLCALC 71 08/19/2024   TRIG 96 08/19/2024   CHOLHDL 4.0 08/19/2024    Significant Diagnostic Results in last 30 days:  No results found.  Assessment/Plan  1. Bradycardia (Primary) -  denies chest pains -  not on any rate-controlling medication -  will follow up with [facility] cardiology  2. Paroxysmal atrial fibrillation (HCC) -  rate-controlled -  not any rate-controlling medication -  continue Pradaxa  150 mg BID for anticoagulation  3. 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  Labs/tests ordered:  None    Yvonda Fouty Medina-Vargas, DNP, MSN, FNP-BC [facility] [phone] (Monday-Friday 8:00 a.m. - 5:00 p.m.) [phone] (after hours)

## 2024-09-29 DIAGNOSIS — Z952 Presence of prosthetic heart valve: Secondary | ICD-10-CM | POA: Diagnosis not present

## 2024-09-29 DIAGNOSIS — I4891 Unspecified atrial fibrillation: Secondary | ICD-10-CM | POA: Diagnosis not present

## 2024-09-29 DIAGNOSIS — I4892 Unspecified atrial flutter: Secondary | ICD-10-CM | POA: Diagnosis not present

## 2024-09-29 DIAGNOSIS — I5022 Chronic systolic (congestive) heart failure: Secondary | ICD-10-CM | POA: Diagnosis not present

## 2024-09-29 DIAGNOSIS — I519 Heart disease, unspecified: Secondary | ICD-10-CM | POA: Diagnosis not present

## 2024-09-29 DIAGNOSIS — E7800 Pure hypercholesterolemia, unspecified: Secondary | ICD-10-CM | POA: Diagnosis not present

## 2024-09-29 DIAGNOSIS — Z9889 Other specified postprocedural states: Secondary | ICD-10-CM | POA: Diagnosis not present

## 2024-09-29 DIAGNOSIS — R0609 Other forms of dyspnea: Secondary | ICD-10-CM | POA: Diagnosis not present

## 2024-09-29 DIAGNOSIS — I10 Essential (primary) hypertension: Secondary | ICD-10-CM | POA: Diagnosis not present

## 2024-10-02 ENCOUNTER — Encounter: Payer: Self-pay | Admitting: Adult Health

## 2024-10-02 ENCOUNTER — Non-Acute Institutional Stay (SKILLED_NURSING_FACILITY): Payer: Self-pay | Admitting: Adult Health

## 2024-10-02 DIAGNOSIS — R202 Paresthesia of skin: Secondary | ICD-10-CM | POA: Diagnosis not present

## 2024-10-02 DIAGNOSIS — L03116 Cellulitis of left lower limb: Secondary | ICD-10-CM

## 2024-10-02 DIAGNOSIS — Z8673 Personal history of transient ischemic attack (TIA), and cerebral infarction without residual deficits: Secondary | ICD-10-CM | POA: Diagnosis not present

## 2024-10-02 DIAGNOSIS — I6930 Unspecified sequelae of cerebral infarction: Secondary | ICD-10-CM | POA: Diagnosis not present

## 2024-10-02 DIAGNOSIS — R413 Other amnesia: Secondary | ICD-10-CM | POA: Diagnosis not present

## 2024-10-02 DIAGNOSIS — I639 Cerebral infarction, unspecified: Secondary | ICD-10-CM | POA: Diagnosis not present

## 2024-10-02 DIAGNOSIS — R4781 Slurred speech: Secondary | ICD-10-CM | POA: Diagnosis not present

## 2024-10-02 DIAGNOSIS — R4701 Aphasia: Secondary | ICD-10-CM | POA: Diagnosis not present

## 2024-10-02 DIAGNOSIS — R200 Anesthesia of skin: Secondary | ICD-10-CM | POA: Diagnosis not present

## 2024-10-02 DIAGNOSIS — R531 Weakness: Secondary | ICD-10-CM | POA: Diagnosis not present

## 2024-10-02 NOTE — Progress Notes (Signed)
 Location:  Other ([facility]) Nursing Home Room Number: [address] [facility]) Place of Service:  SNF (31) Provider:  Medina-Vargas, Brinae Woods, DNP, FNP-BC  Patient Care Team: Terry Norleen BIRCH, MD as PCP - General (Internal Medicine)  Extended Emergency Contact Information Primary Emergency Contact: [name],Terry S Address: [address]          [place], [place] [address] [place] Home Phone: [phone] Relation: Spouse Secondary Emergency Contact: [name],Terry  [place] Home Phone: [phone] Relation: Son  Code Status:  Full Code  Goals of care: Advanced Directive information    10/02/2024   10:27 AM  Advanced Directives  Does Patient Have a Medical Advance Directive? No  Would patient like information on creating a medical advance directive? No - Patient declined     Chief Complaint  Patient presents with   Foot Swelling      left foot redness    HPI:  Pt is a 73 y.o. male seen today for an acute visit regarding left foot pain. He is a resident of [facility].  He was noted to have left foot redness with dry rash on 3rd, 2nd toe and 1st metatarsal area. Area is erythematous, non tender.  He has history of CVA with right hemiplegia. He takes Lipitor 80 mg daily, ASA 81 mg daily and Pradaxa  150 mg BID. He has a neurology appointment today.   Past Medical History:  Diagnosis Date   Diverticulosis    GERD (gastroesophageal reflux disease)    Heart murmur    Hypercholesteremia    Hypertension    Mitral valve prolapse    Stroke [facility])    Past Surgical History:  Procedure Laterality Date   COLONOSCOPY WITH PROPOFOL  N/A 03/06/2020   Procedure: COLONOSCOPY WITH PROPOFOL ;  Surgeon: Toledo, Ladell POUR, MD;  Location: [facility] ENDOSCOPY;  Service: Gastroenterology;  Laterality: N/A;   MITRAL VALVE REPLACEMENT     RETINAL DETACHMENT SURGERY     RIGHT/LEFT HEART CATH AND CORONARY ANGIOGRAPHY Bilateral 02/18/2017    Procedure: Right/Left Heart Cath and Coronary Angiography;  Surgeon: Marsa Dooms, MD;  Location: [facility] INVASIVE CV LAB;  Service: Cardiovascular;  Laterality: Bilateral;   RIGHT/LEFT HEART CATH AND CORONARY ANGIOGRAPHY N/A 02/22/2020   Procedure: RIGHT/LEFT HEART CATH AND CORONARY ANGIOGRAPHY;  Surgeon: Dooms Marsa, MD;  Location: [facility] INVASIVE CV LAB;  Service: Cardiovascular;  Laterality: N/A;   TEMPOROMANDIBULAR JOINT SURGERY      Allergies  Allergen Reactions   Penicillins Shortness Of Breath    Did it involve swelling of the face/tongue/throat, SOB, or low BP? Yes Did it involve sudden or severe rash/hives, skin peeling, or any reaction on the inside of your mouth or nose? Unknown Did you need to seek medical attention at a hospital or doctor's office? Was at a clinic when reaction occurred When did it last happen? More than 50 years ago If all above answers are "NO", may proceed with cephalosporin use.    Ciprofloxacin Swelling and Rash   Ivp Dye [Iodinated Contrast Media] Rash    Outpatient Encounter Medications as of 10/02/2024  Medication Sig   Apoaequorin (PREVAGEN) 10 MG CAPS Take 1 capsule by mouth daily.   aspirin  EC 81 MG EC tablet Take 1 tablet (81 mg total) by mouth daily.   atorvastatin  (LIPITOR) 80 MG tablet Take 1 tablet (80 mg total) by mouth daily.   clobetasol  ointment (TEMOVATE ) 0.05 % Apply 1 application topically daily as needed (skin irritation).  dabigatran  (PRADAXA ) 150 MG CAPS capsule Take 1 capsule (150 mg total) by mouth every 12 (twelve) hours.   feeding supplement (ENSURE PLUS HIGH PROTEIN) LIQD Take 237 mLs by mouth 2 (two) times daily between meals.   melatonin 5 MG TABS Take 1 tablet (5 mg total) by mouth at bedtime.   Multiple Vitamin (MULTIVITAMIN WITH MINERALS) TABS tablet Take 1 tablet by mouth at bedtime.   nortriptyline (PAMELOR) 50 MG capsule Take 50 mg by mouth at bedtime.   omeprazole (PRILOSEC) 20 MG capsule Take 20 mg by  mouth every evening.    polyethylene glycol (MIRALAX / GLYCOLAX) 17 g packet Take 17 g by mouth 2 (two) times daily.   valACYclovir  (VALTREX ) 500 MG tablet Take 500 mg by mouth at bedtime.   No facility-administered encounter medications on file as of 10/02/2024.    Review of Systems  Constitutional:  Negative for activity change, appetite change and fever.  HENT:  Negative for sore throat.   Eyes: Negative.   Cardiovascular:  Negative for chest pain and leg swelling.  Gastrointestinal:  Negative for abdominal distention, diarrhea and vomiting.  Genitourinary:  Negative for dysuria, frequency and urgency.  Skin:  Positive for rash. Negative for color change.  Neurological:  Negative for dizziness and headaches.  Psychiatric/Behavioral:  Negative for behavioral problems and sleep disturbance. The patient is not nervous/anxious.      Immunization History  Administered Date(s) Administered   INFLUENZA, HIGH DOSE SEASONAL PF 09/20/2017, 09/30/2019   Influenza Inj Mdck Quad Pf 10/12/2018, 09/29/2021   Influenza Whole 10/13/2017   Influenza-Unspecified 10/13/2017, 10/16/2022   Moderna Sars-Covid-2 Vaccination 01/22/2020, 02/19/2020   Pneumococcal Polysaccharide-23 04/13/2015   Unspecified SARS-COV-2 Vaccination 11/15/2023   Pertinent  Health Maintenance Due  Topic Date Due   Influenza Vaccine  07/28/2024   Colonoscopy  03/06/2030      04/01/2021    3:23 PM 04/01/2021   11:00 PM 04/02/2021    8:00 AM 04/02/2021    9:15 PM 04/03/2021    8:00 AM  Fall Risk  (RETIRED) Patient Fall Risk Level Low fall risk  Low fall risk  Moderate fall risk  Low fall risk  Low fall risk      Data saved with a previous flowsheet row definition     Vitals:   10/02/24 1027  BP: 124/76  Pulse: 84  Resp: 17  Temp: (!) 97.1 F (36.2 C)  SpO2: 99%  Weight: 180 lb (81.6 kg)  Height: 5' 7 (1.702 m)   Body mass index is 28.19 kg/m.  Physical Exam Constitutional:      General: He is not in acute  distress.    Appearance: Normal appearance.  HENT:     Head: Normocephalic and atraumatic.     Mouth/Throat:     Mouth: Mucous membranes are moist.  Eyes:     Conjunctiva/sclera: Conjunctivae normal.  Cardiovascular:     Rate and Rhythm: Normal rate and regular rhythm.     Pulses: Normal pulses.     Heart sounds: Normal heart sounds.  Pulmonary:     Effort: Pulmonary effort is normal.     Breath sounds: Normal breath sounds.  Abdominal:     General: Bowel sounds are normal.     Palpations: Abdomen is soft.  Musculoskeletal:        General: Swelling present.     Cervical back: Normal range of motion.     Right lower leg: Edema present.     Left lower leg:  Edema present.     Comments: BLE 1+edema  Skin:    General: Skin is warm and dry.  Neurological:     General: No focal deficit present.     Mental Status: He is alert and oriented to person, place, and time.     Comments: Right hemiplegia  Psychiatric:        Mood and Affect: Mood normal.        Behavior: Behavior normal.      Labs reviewed: Recent Labs    08/18/24 0341 08/19/24 0614 08/22/24 0428 08/23/24 0411 08/24/24 0542  NA  --    < > 141 140 137  K  --    < > 3.9 4.0 3.8  CL  --    < > 106 105 104  CO2  --    < > 26 26 25   GLUCOSE  --    < > 104* 106* 113*  BUN  --    < > 34* 36* 34*  CREATININE  --    < > 1.20 1.28* 1.24  CALCIUM   --    < > 9.7 9.5 9.3  MG 1.9  --   --   --   --    < > = values in this interval not displayed.   Recent Labs    08/18/24 0321  AST 24  ALT 20  ALKPHOS 80  BILITOT 0.8  PROT 6.8  ALBUMIN 3.9   Recent Labs    08/18/24 0321 08/20/24 0603 08/22/24 0428 08/23/24 0411 08/24/24 0542  WBC 7.8   < > 7.3 7.8 7.7  NEUTROABS 5.9  --   --   --   --   HGB 13.7   < > 15.5 15.2 14.8  HCT 39.3   < > 44.4 44.0 42.2  MCV 99.2   < > 98.7 98.9 96.8  PLT 176   < > 231 251 230   < > = values in this interval not displayed.   Lab Results  Component Value Date   TSH 3.956  08/18/2024   Lab Results  Component Value Date   HGBA1C 5.3 08/18/2024   Lab Results  Component Value Date   CHOL 120 08/19/2024   HDL 30 (L) 08/19/2024   LDLCALC 71 08/19/2024   TRIG 96 08/19/2024   CHOLHDL 4.0 08/19/2024    Significant Diagnostic Results in last 30 days:  No results found.  Assessment/Plan  1. Cellulitis of left lower extremity (Primary) -   has erythematous left foot -  start Doxycycline 100 mg BID X 7 day  2. Late effects of CVA (cerebrovascular accident) -  has right hemiplegia -  continue Lipitor, Pradaxa  and ASA -  continue PT and OT for therapeutic strengthening exercises -  fall precautions -  follow up with neurology    Family/ staff Communication: Discussed plan of care with resident and charge nurse.  Labs/tests ordered: None    Chaynce Schafer Medina-Vargas, DNP, MSN, FNP-BC [facility] [phone] (Monday-Friday 8:00 a.m. - 5:00 p.m.) [phone] (after hours)

## 2024-10-13 ENCOUNTER — Non-Acute Institutional Stay (SKILLED_NURSING_FACILITY): Payer: Self-pay | Admitting: Orthopedic Surgery

## 2024-10-13 ENCOUNTER — Encounter: Payer: Self-pay | Admitting: Orthopedic Surgery

## 2024-10-13 DIAGNOSIS — L03116 Cellulitis of left lower limb: Secondary | ICD-10-CM | POA: Diagnosis not present

## 2024-10-13 DIAGNOSIS — I480 Paroxysmal atrial fibrillation: Secondary | ICD-10-CM

## 2024-10-13 DIAGNOSIS — I69359 Hemiplegia and hemiparesis following cerebral infarction affecting unspecified side: Secondary | ICD-10-CM | POA: Diagnosis not present

## 2024-10-13 DIAGNOSIS — I10 Essential (primary) hypertension: Secondary | ICD-10-CM | POA: Diagnosis not present

## 2024-10-13 DIAGNOSIS — K219 Gastro-esophageal reflux disease without esophagitis: Secondary | ICD-10-CM

## 2024-10-13 DIAGNOSIS — E7800 Pure hypercholesterolemia, unspecified: Secondary | ICD-10-CM | POA: Diagnosis not present

## 2024-10-13 NOTE — Progress Notes (Signed)
 Location:  Other ([facility]) Nursing Home Room Number: [address] Place of Service:  SNF [phone]) Provider:  Greig Cluster, NP   Patient Care Team: [name], MD as PCP - General (Internal Medicine)  Extended Emergency Contact Information Primary Emergency Contact: Ogden,[name] Address: [address]          [place], [place] [address] [place] Home Phone: [phone] Relation: Spouse Secondary Emergency Contact: Cudney,[name]  [place] Home Phone: [phone] Relation: Son  Code Status:  Full Code Goals of care: Advanced Directive information    10/02/2024   10:27 AM  Advanced Directives  Does Patient Have a Medical Advance Directive? No  Would patient like information on creating a medical advance directive? No - Patient declined     Chief Complaint  Patient presents with   Routine Visit    HPI:  Pt is a 73 y.o. male seen today for medical management of chronic diseases.    He currently resides on the skilled nursing unit at [facility]. PMH: acute CVA, HTN, MVP, PAF, venous insufficiency, GERD, AKI, HLD, and retinal detachment.   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, working with PT> some improvement to right arm/leg movement, would like to discharge home next week, 10/06 Pradaxa  discontinued> Eliquis  started, remains on asa and plavix  PAF- T4 0.91 08/18/2024, remains on Eliquis  GERD- hgb 14.8 08/24/2024, remains on omeprazole HTN- BUN/creat 34/1.24 08/24/2024, not on medication HLD- total 120, LDL 71 08/19/2024, remains on atorvastatin  Cellulitis- 10/06 left foot swollen, resolved with doxycycline x 7 days   Recent weights:  10/15- 180.4 lbs  10/08- 181.2 lbs   08/30- 173.4 lbs   Recent blood pressures:  10/16- 100/68, 122/77  10/15- 117/63, 108/66     Past Medical  History:  Diagnosis Date   Diverticulosis    GERD (gastroesophageal reflux disease)    Heart murmur    Hypercholesteremia    Hypertension    Mitral valve prolapse    Stroke [facility])    Past Surgical History:  Procedure Laterality Date   COLONOSCOPY WITH PROPOFOL  N/A 03/06/2020   Procedure: COLONOSCOPY WITH PROPOFOL ;  Surgeon: Toledo, Ladell POUR, MD;  Location: [facility] ENDOSCOPY;  Service: Gastroenterology;  Laterality: N/A;   MITRAL VALVE REPLACEMENT     RETINAL DETACHMENT SURGERY     RIGHT/LEFT HEART CATH AND CORONARY ANGIOGRAPHY Bilateral 02/18/2017   Procedure: Right/Left Heart Cath and Coronary Angiography;  Surgeon: Marsa Dooms, MD;  Location: [facility] INVASIVE CV LAB;  Service: Cardiovascular;  Laterality: Bilateral;   RIGHT/LEFT HEART CATH AND CORONARY ANGIOGRAPHY N/A 02/22/2020   Procedure: RIGHT/LEFT HEART CATH AND CORONARY ANGIOGRAPHY;  Surgeon: Dooms Marsa, MD;  Location: [facility] INVASIVE CV LAB;  Service: Cardiovascular;  Laterality: N/A;   TEMPOROMANDIBULAR JOINT SURGERY      Allergies  Allergen Reactions   Penicillins Shortness Of Breath    Did it involve swelling of the face/tongue/throat, SOB, or low BP? Yes Did it involve sudden or severe rash/hives, skin peeling, or any reaction on the inside of your mouth or nose? Unknown Did you need to seek medical attention at a hospital or doctor's office? Was at a clinic when reaction occurred When did it last happen? More than 50 years ago If all above answers are "NO", may proceed with cephalosporin use.    Ciprofloxacin Swelling and Rash  Ivp Dye [Iodinated Contrast Media] Rash    Outpatient Encounter Medications as of 10/13/2024  Medication Sig   apixaban  (ELIQUIS ) 5 MG TABS tablet Take 5 mg by mouth 2 (two) times daily.   Apoaequorin (PREVAGEN) 10 MG CAPS Take 1 capsule by mouth daily.   aspirin  EC 81 MG EC tablet Take 1 tablet (81 mg total) by mouth daily.   atorvastatin  (LIPITOR) 80 MG tablet Take 1 tablet (80  mg total) by mouth daily.   clobetasol  ointment (TEMOVATE ) 0.05 % Apply 1 application topically daily as needed (skin irritation).    clopidogrel  (PLAVIX ) 75 MG tablet Take 75 mg by mouth daily.   feeding supplement (ENSURE PLUS HIGH PROTEIN) LIQD Take 237 mLs by mouth 2 (two) times daily between meals.   melatonin 5 MG TABS Take 1 tablet (5 mg total) by mouth at bedtime.   Multiple Vitamin (MULTIVITAMIN WITH MINERALS) TABS tablet Take 1 tablet by mouth at bedtime.   nortriptyline (PAMELOR) 50 MG capsule Take 50 mg by mouth at bedtime.   omeprazole (PRILOSEC) 20 MG capsule Take 20 mg by mouth every evening.    polyethylene glycol (MIRALAX / GLYCOLAX) 17 g packet Take 17 g by mouth 2 (two) times daily.   valACYclovir  (VALTREX ) 500 MG tablet Take 500 mg by mouth at bedtime.   dabigatran  (PRADAXA ) 150 MG CAPS capsule Take 1 capsule (150 mg total) by mouth every 12 (twelve) hours. (Patient not taking: Reported on 10/13/2024)   No facility-administered encounter medications on file as of 10/13/2024.    Review of Systems  Constitutional: Negative.   HENT:  Negative for sore throat and trouble swallowing.   Eyes: Negative.   Respiratory:  Negative for shortness of breath.   Cardiovascular:  Negative for chest pain.  Gastrointestinal:  Negative for abdominal distention and abdominal pain.  Genitourinary:  Negative for hematuria.  Musculoskeletal:  Positive for gait problem.  Skin:  Positive for wound.  Neurological:  Positive for speech difficulty and weakness. Negative for dizziness and headaches.  Psychiatric/Behavioral:  Negative for confusion, dysphoric mood and sleep disturbance. The patient is not nervous/anxious.     Immunization History  Administered Date(s) Administered   INFLUENZA, HIGH DOSE SEASONAL PF 09/20/2017, 09/30/2019   Influenza Inj Mdck Quad Pf 10/12/2018, 09/29/2021   Influenza Whole 10/13/2017   Influenza-Unspecified 10/13/2017, 10/16/2022   Moderna Sars-Covid-2  Vaccination 01/22/2020, 02/19/2020   Pneumococcal Polysaccharide-23 04/13/2015   Unspecified SARS-COV-2 Vaccination 11/15/2023   Pertinent  Health Maintenance Due  Topic Date Due   Influenza Vaccine  07/28/2024   Colonoscopy  03/06/2030      04/01/2021    3:23 PM 04/01/2021   11:00 PM 04/02/2021    8:00 AM 04/02/2021    9:15 PM 04/03/2021    8:00 AM  Fall Risk  (RETIRED) Patient Fall Risk Level Low fall risk  Low fall risk  Moderate fall risk  Low fall risk  Low fall risk      Data saved with a previous flowsheet row definition   Functional Status Survey:    Vitals:   10/13/24 1120  BP: 125/75  Pulse: 90  Resp: 20  Temp: (!) 96.9 F (36.1 C)  SpO2: 98%  Weight: 180 lb 6.4 oz (81.8 kg)  Height: 5' 7 (1.702 m)   Body mass index is 28.25 kg/m. Physical Exam Vitals reviewed.  Constitutional:      General: He is not in acute distress. HENT:     Head: Normocephalic.  Right Ear: There is no impacted cerumen.     Left Ear: There is no impacted cerumen.     Nose: Nose normal.     Mouth/Throat:     Mouth: Mucous membranes are moist.  Eyes:     General:        Right eye: No discharge.        Left eye: No discharge.  Cardiovascular:     Rate and Rhythm: Normal rate and regular rhythm.     Pulses: Normal pulses.     Heart sounds: Normal heart sounds.  Pulmonary:     Effort: Pulmonary effort is normal.     Breath sounds: Normal breath sounds.  Abdominal:     General: Bowel sounds are normal. There is no distension.     Palpations: Abdomen is soft.     Tenderness: There is no abdominal tenderness.  Musculoskeletal:     Cervical back: Neck supple.     Right lower leg: No edema.     Left lower leg: No edema.  Skin:    General: Skin is warm.     Capillary Refill: Capillary refill takes less than 2 seconds.     Comments: Left foot swelling resolved, no erythema, warmth or tenderness  Neurological:     General: No focal deficit present.     Mental Status: He is alert.  Mental status is at baseline.     Motor: Weakness present.     Gait: Gait abnormal.     Comments: Aphasia, right hemiparesis  Psychiatric:        Mood and Affect: Mood normal.     Labs reviewed: Recent Labs    08/18/24 0341 08/19/24 0614 08/22/24 0428 08/23/24 0411 08/24/24 0542  NA  --    < > 141 140 137  K  --    < > 3.9 4.0 3.8  CL  --    < > 106 105 104  CO2  --    < > 26 26 25   GLUCOSE  --    < > 104* 106* 113*  BUN  --    < > 34* 36* 34*  CREATININE  --    < > 1.20 1.28* 1.24  CALCIUM   --    < > 9.7 9.5 9.3  MG 1.9  --   --   --   --    < > = values in this interval not displayed.   Recent Labs    08/18/24 0321  AST 24  ALT 20  ALKPHOS 80  BILITOT 0.8  PROT 6.8  ALBUMIN 3.9   Recent Labs    08/18/24 0321 08/20/24 0603 08/22/24 0428 08/23/24 0411 08/24/24 0542  WBC 7.8   < > 7.3 7.8 7.7  NEUTROABS 5.9  --   --   --   --   HGB 13.7   < > 15.5 15.2 14.8  HCT 39.3   < > 44.4 44.0 42.2  MCV 99.2   < > 98.7 98.9 96.8  PLT 176   < > 231 251 230   < > = values in this interval not displayed.   Lab Results  Component Value Date   TSH 3.956 08/18/2024   Lab Results  Component Value Date   HGBA1C 5.3 08/18/2024   Lab Results  Component Value Date   CHOL 120 08/19/2024   HDL 30 (L) 08/19/2024   LDLCALC 71 08/19/2024   TRIG 96 08/19/2024   CHOLHDL 4.0 08/19/2024  Significant Diagnostic Results in last 30 days:  No results found.  Assessment/Plan 1. 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 Paroxysmal atrial fibrillation (HCC) - HR< 100 without medication - cont Eliquis  for clot prevention  3. Gastroesophageal reflux disease without esophagitis - hgb stable - cont  omeprazole  4. Primary hypertension - controlled without medication, goal < 130/80 due to past CVA  5. Hypercholesteremia - LDL stable - cont atorvastatin   6. Cellulitis of left lower extremity - noted 10/06 - left foot without swelling/erythema/warmth/tenderness - completed doxycycline x 7 days     Family/ staff Communication: plan discussed with patient and nurse  Labs/tests ordered:  none

## 2024-10-18 DIAGNOSIS — M6281 Muscle weakness (generalized): Secondary | ICD-10-CM | POA: Diagnosis not present

## 2024-10-18 DIAGNOSIS — Z741 Need for assistance with personal care: Secondary | ICD-10-CM | POA: Diagnosis not present

## 2024-10-18 DIAGNOSIS — R29810 Facial weakness: Secondary | ICD-10-CM | POA: Diagnosis not present

## 2024-10-30 DIAGNOSIS — I4892 Unspecified atrial flutter: Secondary | ICD-10-CM | POA: Diagnosis not present

## 2024-10-30 DIAGNOSIS — S91109S Unspecified open wound of unspecified toe(s) without damage to nail, sequela: Secondary | ICD-10-CM | POA: Diagnosis not present

## 2024-10-30 DIAGNOSIS — N1831 Chronic kidney disease, stage 3a: Secondary | ICD-10-CM | POA: Diagnosis not present

## 2024-10-30 DIAGNOSIS — I4891 Unspecified atrial fibrillation: Secondary | ICD-10-CM | POA: Diagnosis not present

## 2024-10-30 DIAGNOSIS — Z09 Encounter for follow-up examination after completed treatment for conditions other than malignant neoplasm: Secondary | ICD-10-CM | POA: Diagnosis not present

## 2024-10-30 DIAGNOSIS — K5900 Constipation, unspecified: Secondary | ICD-10-CM | POA: Diagnosis not present

## 2024-10-30 DIAGNOSIS — I10 Essential (primary) hypertension: Secondary | ICD-10-CM | POA: Diagnosis not present

## 2024-10-30 DIAGNOSIS — R21 Rash and other nonspecific skin eruption: Secondary | ICD-10-CM | POA: Diagnosis not present

## 2024-10-30 DIAGNOSIS — I5022 Chronic systolic (congestive) heart failure: Secondary | ICD-10-CM | POA: Diagnosis not present

## 2024-10-30 DIAGNOSIS — Z23 Encounter for immunization: Secondary | ICD-10-CM | POA: Diagnosis not present

## 2024-10-30 DIAGNOSIS — Z8673 Personal history of transient ischemic attack (TIA), and cerebral infarction without residual deficits: Secondary | ICD-10-CM | POA: Diagnosis not present

## 2024-10-30 DIAGNOSIS — R0989 Other specified symptoms and signs involving the circulatory and respiratory systems: Secondary | ICD-10-CM | POA: Diagnosis not present

## 2024-11-02 DIAGNOSIS — I639 Cerebral infarction, unspecified: Secondary | ICD-10-CM | POA: Diagnosis not present

## 2024-11-02 DIAGNOSIS — H35371 Puckering of macula, right eye: Secondary | ICD-10-CM | POA: Diagnosis not present

## 2024-11-02 DIAGNOSIS — H2512 Age-related nuclear cataract, left eye: Secondary | ICD-10-CM | POA: Diagnosis not present

## 2024-11-02 DIAGNOSIS — H3321 Serous retinal detachment, right eye: Secondary | ICD-10-CM | POA: Diagnosis not present

## 2024-11-08 DIAGNOSIS — G459 Transient cerebral ischemic attack, unspecified: Secondary | ICD-10-CM | POA: Diagnosis not present

## 2024-11-08 DIAGNOSIS — I4892 Unspecified atrial flutter: Secondary | ICD-10-CM | POA: Diagnosis not present

## 2024-11-08 DIAGNOSIS — I4891 Unspecified atrial fibrillation: Secondary | ICD-10-CM | POA: Diagnosis not present

## 2024-11-09 DIAGNOSIS — I6300 Cerebral infarction due to thrombosis of unspecified precerebral artery: Secondary | ICD-10-CM | POA: Diagnosis not present

## 2024-11-09 DIAGNOSIS — I69354 Hemiplegia and hemiparesis following cerebral infarction affecting left non-dominant side: Secondary | ICD-10-CM | POA: Diagnosis not present

## 2024-11-09 DIAGNOSIS — N182 Chronic kidney disease, stage 2 (mild): Secondary | ICD-10-CM | POA: Diagnosis not present

## 2024-11-09 DIAGNOSIS — I69392 Facial weakness following cerebral infarction: Secondary | ICD-10-CM | POA: Diagnosis not present

## 2024-11-09 DIAGNOSIS — I082 Rheumatic disorders of both aortic and tricuspid valves: Secondary | ICD-10-CM | POA: Diagnosis not present

## 2024-11-09 DIAGNOSIS — Z7901 Long term (current) use of anticoagulants: Secondary | ICD-10-CM | POA: Diagnosis not present

## 2024-11-09 DIAGNOSIS — I480 Paroxysmal atrial fibrillation: Secondary | ICD-10-CM | POA: Diagnosis not present

## 2024-11-09 DIAGNOSIS — I872 Venous insufficiency (chronic) (peripheral): Secondary | ICD-10-CM | POA: Diagnosis not present

## 2024-11-09 DIAGNOSIS — Z556 Problems related to health literacy: Secondary | ICD-10-CM | POA: Diagnosis not present

## 2024-11-09 DIAGNOSIS — I129 Hypertensive chronic kidney disease with stage 1 through stage 4 chronic kidney disease, or unspecified chronic kidney disease: Secondary | ICD-10-CM | POA: Diagnosis not present

## 2024-11-09 DIAGNOSIS — N179 Acute kidney failure, unspecified: Secondary | ICD-10-CM | POA: Diagnosis not present

## 2024-11-09 DIAGNOSIS — Z87891 Personal history of nicotine dependence: Secondary | ICD-10-CM | POA: Diagnosis not present

## 2024-11-09 DIAGNOSIS — I69351 Hemiplegia and hemiparesis following cerebral infarction affecting right dominant side: Secondary | ICD-10-CM | POA: Diagnosis not present

## 2024-11-09 DIAGNOSIS — K219 Gastro-esophageal reflux disease without esophagitis: Secondary | ICD-10-CM | POA: Diagnosis not present

## 2024-11-09 DIAGNOSIS — I378 Other nonrheumatic pulmonary valve disorders: Secondary | ICD-10-CM | POA: Diagnosis not present

## 2024-11-09 DIAGNOSIS — E7800 Pure hypercholesterolemia, unspecified: Secondary | ICD-10-CM | POA: Diagnosis not present

## 2024-11-09 DIAGNOSIS — H3320 Serous retinal detachment, unspecified eye: Secondary | ICD-10-CM | POA: Diagnosis not present

## 2024-11-09 DIAGNOSIS — I69320 Aphasia following cerebral infarction: Secondary | ICD-10-CM | POA: Diagnosis not present

## 2024-11-09 DIAGNOSIS — Z7982 Long term (current) use of aspirin: Secondary | ICD-10-CM | POA: Diagnosis not present

## 2024-11-09 DIAGNOSIS — I700 Atherosclerosis of aorta: Secondary | ICD-10-CM | POA: Diagnosis not present

## 2024-11-15 ENCOUNTER — Other Ambulatory Visit: Payer: Self-pay | Admitting: Nephrology

## 2024-11-15 DIAGNOSIS — N1831 Chronic kidney disease, stage 3a: Secondary | ICD-10-CM

## 2024-11-15 DIAGNOSIS — I10 Essential (primary) hypertension: Secondary | ICD-10-CM | POA: Diagnosis not present

## 2024-11-16 DIAGNOSIS — S91109D Unspecified open wound of unspecified toe(s) without damage to nail, subsequent encounter: Secondary | ICD-10-CM | POA: Diagnosis not present

## 2024-11-18 DIAGNOSIS — R29810 Facial weakness: Secondary | ICD-10-CM | POA: Diagnosis not present

## 2024-11-18 DIAGNOSIS — Z741 Need for assistance with personal care: Secondary | ICD-10-CM | POA: Diagnosis not present

## 2024-11-18 DIAGNOSIS — M6281 Muscle weakness (generalized): Secondary | ICD-10-CM | POA: Diagnosis not present

## 2024-11-21 ENCOUNTER — Ambulatory Visit
Admission: RE | Admit: 2024-11-21 | Discharge: 2024-11-21 | Disposition: A | Discharge status: Home / Self Care | Source: Ambulatory Visit | Attending: Nephrology | Admitting: Nephrology

## 2024-11-21 DIAGNOSIS — I129 Hypertensive chronic kidney disease with stage 1 through stage 4 chronic kidney disease, or unspecified chronic kidney disease: Secondary | ICD-10-CM | POA: Diagnosis not present

## 2024-11-21 DIAGNOSIS — N1831 Chronic kidney disease, stage 3a: Secondary | ICD-10-CM | POA: Diagnosis not present

## 2024-11-21 DIAGNOSIS — N179 Acute kidney failure, unspecified: Secondary | ICD-10-CM | POA: Diagnosis not present

## 2024-11-21 DIAGNOSIS — N281 Cyst of kidney, acquired: Secondary | ICD-10-CM | POA: Diagnosis not present

## 2024-11-21 DIAGNOSIS — I69320 Aphasia following cerebral infarction: Secondary | ICD-10-CM | POA: Diagnosis not present

## 2024-11-21 DIAGNOSIS — I480 Paroxysmal atrial fibrillation: Secondary | ICD-10-CM | POA: Diagnosis not present

## 2024-11-21 DIAGNOSIS — I700 Atherosclerosis of aorta: Secondary | ICD-10-CM | POA: Diagnosis not present

## 2024-11-21 DIAGNOSIS — I69351 Hemiplegia and hemiparesis following cerebral infarction affecting right dominant side: Secondary | ICD-10-CM | POA: Diagnosis not present

## 2024-11-21 DIAGNOSIS — I69354 Hemiplegia and hemiparesis following cerebral infarction affecting left non-dominant side: Secondary | ICD-10-CM | POA: Diagnosis not present

## 2025-01-02 NOTE — Progress Notes (Signed)
 Today the history is gathered from: 30% - patient  70% - wife, son  REFERRING PHYSICIAN: Rudolpho Norleen Lenis, MD PRIMARY CARE PHYSICIAN:  Rudolpho Norleen Lenis, MD  IMPRESSION/PLAN  Terry [name] is a 74 y.o. male presenting for evaluation of  HISTORY OF STROKE/ EXPRESSIVE APHASIA/ MEMORY CONCERNS/ NUMBNESS/ TINGLING / NEUROPATHY/ IMBALANCE - Ongoing.  - Patient with new onset SOB, dyspnea, decreased appetite, decreased hydration, hypotension, increased daytime fatigue, sluggishness. Denies burning, itching, pain when urinating, hematuria. Slightly worsening lower extremity weakness. 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 Difficulty with gait and balance, needs assistance with ADLs. - Memory evaluation today is 17/28. Repeat in 4-6 months. - Order CT Head WO to evaluate for new stroke given gradually worsening right sided residual weakness, increased confusion, slightly worsening slurred speech. - Order UA to evaluate for UTI given new onset of speech difficulty, confusion, dyspnea for a few days. - Order CMP, CBC with diff, TSH, B12, ammonia level. - Start Remeron  (Mirtazapine ) 15 mg nightly for appetite stimulant. - Continue Eliquis  5 mg twice daily for secondary stroke prevention. - Continue Aspirin  81 mg daily for secondary stroke prevention. - Continue Aricept  5 mg at night to slow progression of memory loss as managed by PCP. - Continue Nortriptyline 50 mg at night. Refill.  - Continue PT, OT, ST as directed. - Order Chest X-ray to evaluate for pneumonia given productive cough and dyspnea for a few days.  Over 40 minutes of physician time has been spent before, during and after this visit which includes time for precharting and preparing to see the patient, family and/or caregiver, documenting clinical information in the electronic or other health record, independently interpreting results and communicating results to the patient, family and/or  caregiver, getting and/or reviewing separately obtained history, ordering medications, tests or procedures, performing a medically appropriate exam and/or evaluation, care coordination, counseling and educating the patient, and referring the patient to and communicating with other health care professionals as needed.    Follow up with Dr. Lane in 2-3 months.   MEDICATIONS PREVIOUSLY TRIED Gabapentin (vivid dreams and dizzy) Lyrica 25 mg BID (swelling)  CHIEF COMPLAINT & HPI  Terry [name] is a 74 y.o. male presenting for evaluation of: Chief Complaint  Patient presents with   HISTORY OF STROKE/ EXPRESSIVE APHASIA   MEMORY CONCERNS/ NUMBNESS   TINGLING / NEUROPATHY/ IMBALANCE    HISTORY OF STROKE/ EXPRESSIVE APHASIA/ MEMORY CONCERNS/ NUMBNESS/ TINGLING / NEUROPATHY/ IMBALANCE Patient with new onset of SOB, dyspnea, decreased appetite, decreased hydration, hypotension, increased daytime fatigue and sluggishness for the past few days. Denies burning, itching, pain when urinating, hematuria. Slightly worsening lower extremity weakness. Currently in PT/OT/ST at [facility]. Wears a gait belt so family members can help patient with transitions. Ambulating with wheelchair today. Overall sedentary at home, needs assistance with ADLs. At night will gasp for air, not snoring. Denies prior sleep study for OSA evaluation. Residual aphasia, ight sided weakness following stroke in August 2025. Did call EMS on 12/01/2024 after one episode of  hypophonia, whispering with gradual increase in tone of voice and mild confusion that resolved. Family members were concerned for stroke, did not go to ER given symptom resolved to baseline. EMS discussed possible TIA. Gradual decline in short term memory over the past 6 months. Repeating questions, conversations, difficulty with names, faces, locations. Taking Aspirin  81 mg daily, Eliquis  5 mg BID, Atorvastatin  81 mg daily. Memory evaluation today is 17/28.  DATA  SUMMARY: 08/18/2024 CT  HEAD WO CONTRAST IMPRESSION:  1. Evidence of mild extension of the left deep white and deep gray  matter lacunar infarct since [facility] MRI 0544 hours today. No  hemorrhagic transformation or mass effect.  2. No new intracranial abnormality identified. Background chronic  ischemic disease.   08/18/2024 CTA HEAD NECK W WO CONTRAST IMPRESSION:  1. No intracranial large vessel occlusion or proximal stenosis.  2. Acute infarction in the left external capsule and body of the  caudate visible as subtle low density. No evidence of hemorrhagic  transformation or mass effect.  3. Old infarction in the left frontal operculum.  4. Atherosclerotic calcification at the carotid bifurcations and ICA  bulbs but no stenosis.   08/18/2024 MR BRAIN WO CONTRAST IMPRESSION:  1. Acute lacunar infarct tracking from the posterior left corona  radiata into the left lentiform. No associated hemorrhage or mass  effect.  2. Underlying chronic small and medium-sized vessel ischemia,  including a previous left MCA territory operculum infarct.  Progressive chronic small vessel disease in the cerebellum since  2022.   08/18/2024 CT HEAD WO CONTRAST IMPRESSION:  No acute intracranial abnormality.   04/01/21 MRI BRAIN WO CONTRAST IMPRESSION:  Acute infarct left PCA territory involving the left hippocampus and  left thalamus  Probable small acute infarct in the high right parietal lobe. This  suggests emboli.  Chronic infarct left frontal lobe   04/01/21 CT HEAD WO CONTRAST IMPRESSION:  1. No acute intracranial findings.  2. Area of encephalomalacia and gliosis in the left frontal lobe and  insula corresponding to infarct seen on prior MRI.  04/01/21 CT ANGIO HEAD AND NECK W WO CONTRAST IMPRESSION:  1. 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  2. Mild atheromatous disease elsewhere about the major arterial  vasculature of the head and neck as above. No other  hemodynamically  significant or correctable stenosis.   05/10/2020 MRI IMPRESSION:  1. Mild enlargement/superior extension of an acute to early subacute  left MCA infarct in the frontal lobe.  2. Evolving punctate infarcts elsewhere in the cerebrum and  cerebellum as seen on the recent prior MRI.  3. No large vessel occlusion.  4. 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   05/07/2020 CAROTID US  IMPRESSION:  Color duplex indicates moderate heterogeneous plaque with no  hemodynamically significant stenosis by duplex criteria in the  extracranial cerebrovascular circulation.  05/07/2020 IMPRESSION:  Multiple areas of acute infarct including both cerebral hemispheres  and in the cerebellum bilaterally. Findings compatible with emboli.  No hemorrhage or mass.     02/05/2020 EMG Abnormal study.  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    VISIT SUMMARIES: 05/19/21: ongoing. Referral to speech therapy. Talk with dermatologist about alcohol  interactions with medications. Continue Eliquis  5mg  BID. Continue aspirin  81mg  daily.Monitor blood pressure at home. If consistently elevated after recheck, please go to urgent care or ED to be evaluated.Continue atorvastatin  80mg  daily.  11/18/2020: Symptoms ongoing. Continue taking Eliquis  5 mg twice daily for stroke prevention. Continue taking aspirin  81 mg daily for stroke prevention.   05/21/20: Patient with numbness and tingling, ongoing, and history of stroke, new to me. Continue Eliquis  5 mg as prescribed for secondary stroke prevention. Continue aspirin  81 mg daily for secondary stroke prevention.  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  Recommend he start a baby aspirin  81  mg Monday, Wednesday, Friday. Continue to stay active by swimming.  Follow Dr. Ammon for cardiac work  up.  Call our office if symptoms worsen. Will consider starting Cymbalta or Lyrica.  If balance worsens or if he has falls will consider referral to PT. Encouraged patient to use compression stockings.   01/03/2020 Patient with neuropathy.- New, to me. Patient with burning, tingling and numbness located in the bottom of his feet. Worse in the last month. EMG of lowers to evaluate severity of symptoms. Encouraged patient to keep walking daily. Start Gabapentin 100 mg at night for 3 nights, then increase to 200 mg at night for 3 nights, then increase to 300 mg at night. Call our office in 2-3 weeks and let us  know how symptoms are. Will consider increasing Gabapentin.Decrease wine at night while taking Gabapentin. Labs today  Vitamin B12, Thiamine, Vitamin D, Ferritin, Folate, A1C, TSH.   MEDICATIONS Current Outpatient Medications  Medication Sig Dispense Refill   apixaban  (ELIQUIS ) 5 mg tablet Take 1 tablet (5 mg total) by mouth every 12 (twelve) hours 60 tablet 2   aspirin  81 MG EC tablet Take 81 mg by mouth once daily     atorvastatin  (LIPITOR) 80 MG tablet TAKE 1 TABLET (80 MG TOTAL) BY MOUTH ONCE DAILY. 90 tablet 1   cetirizine (ZYRTEC) 10 MG tablet Take 10 mg by mouth once daily     clobetasoL  (TEMOVATE ) 0.05 % cream Apply topically 2 (two) times daily     donepeziL  (ARICEPT ) 5 MG tablet Take 1 tablet (5 mg total) by mouth at bedtime 90 tablet 0   lidocaine  (LIDODERM ) 5 % patch Place 1 patch onto the skin daily Apply patch to the most painful area for up to 12 hours in a 24 hour period.     melatonin 5 mg Tab Take by mouth     multivitamin tablet Take 1 tablet by mouth once daily     nortriptyline (PAMELOR) 50 MG capsule Take 1 capsule (50 mg total) by mouth at bedtime 30 capsule 1   omeprazole (PRILOSEC) 20 MG DR capsule TAKE 1 CAPSULE BY MOUTH TWICE A DAY BEFORE A MEAL. 180 capsule 1   polyethylene glycol (MIRALAX) powder Take 17 g by mouth once daily Mix in 4-8ounces of fluid  prior to taking.     triamcinolone 0.1 % cream Apply topically 2 (two) times daily 454 g 1   valACYclovir  (VALTREX ) 500 MG tablet TAKE ONE TABLET BY MOUTH DAILY AT BEDTIME 90 tablet 1   mirtazapine  (REMERON ) 15 MG tablet Take 1 tablet (15 mg total) by mouth at bedtime 30 tablet 2   sildenafiL (VIAGRA) 100 MG tablet Take 1 tablet (100 mg total) by mouth once daily as needed for Erectile Dysfunction for up to 60 days (Patient not taking: Reported on 01/02/2025) 10 tablet 5   No current facility-administered medications for this visit.    ALLERGIES Allergies  Allergen Reactions   Ciprofloxacin Swelling and Other (See Comments)   Penicillins Shortness Of Breath   Lyrica [Pregabalin] Swelling   Penicillin G Other (See Comments)   Iodinated Contrast Media Rash and Other (See Comments)   Retin-A [Tretinoin] Unknown and Rash   Vioxx [Rofecoxib] Rash   Zyrtec [Cetirizine] Unknown and Rash     EXAM   Vitals:   01/02/25 1320  BP: (!) 86/54  Weight: 68.5 kg (151 lb)  Height: 167.6 cm (5' 6)  PainSc: 0-No pain     Body mass index is 24.37 kg/m.  MEMORY EVALUATION: 01/02/2025 - 17/28 06/14/2024 - 25/30 09/15/2023 - 27/30 06/23/2022 - 30/30  GENERAL: Pleasant male, in nad. Normocephalic and atraumatic.  MUSCULOSKELETAL: Bulk - Normal Tone - Normal Pronator Drift - Absent bilaterally. Ambulation - Gait and station are generally steady. Romberg - negative.  R/L 5/5    Shoulder abduction (deltoid/supraspinatus, axillary/suprascapular n, C5) 5/5    Elbow flexion (biceps brachii, musculoskeletal n, C5-6) 5/5    Elbow extension (triceps, radial n, C7) 5/5    Finger adduction (interossei, ulnar n, T1)  5/5    Hip flexion (iliopsoas, L1/L2) 5/5    Knee flexion (hamstrings, sciatic n, L5/S1)  5/5    Knee extension (quadriceps, femoral n, L3/4) 5/5    Ankle dorsiflexion (tibialis anterior, deep fibular n, L4/5) 5/5    Ankle plantarflexion (gastroc, tibial n, S1)    NEUROLOGICAL: MENTAL STATUS: Patient is oriented to person, place and time.   Short-term memory is intact Long-term memory is intact.   Attention span and concentration are intact.   Naming and repetition are intact. Comprehension is intact.   Expressive speech is intact.   Patient's fund of knowledge is within normal limits for educational level.  CRANIAL NERVES: Visual acuity and visual fields are intact         Extraocular muscles are intact                        Facial sensation is intact bilaterally                Facial strength is intact bilaterally                   Hearing is intact bilaterally                              Palate elevates midline, normal phonation      COORDINATION/CEREBELLAR: Finger to nose testing is deferred      PAST MEDICAL HISTORY Past Medical History:  Diagnosis Date   Arthritis    OA   Bursitis of left hip    Cataract    CHF (congestive heart failure), NYHA class II, chronic, systolic (CMS/HHS-HCC) 04/05/2020   Diverticulosis    Epistaxis    Essential hypertension, benign    GERD (gastroesophageal reflux disease)    Hard of hearing    Herpes simplex without mention of complication    History of cardiac monitoring    History of recurrent TIAs    History of stroke    Mitral valve disorders(424.0)    Neuropathy    bilateral feet    Neuropathy    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   Other and unspecified hyperlipidemia    Other psoriasis    Retinal detachment    Right rotator cuff tear    Seasonal allergies    Severe mitral regurgitation 02/04/2017   Shingles    Stroke (CMS/HHS-HCC)    2022 and 08/18/2024   Testicular pain    TMJ syndrome    Tubular adenoma 06/01/2014   Varicose vein of leg     PAST SURGICAL HISTORY Past Surgical History:  Procedure Laterality Date   COLONOSCOPY  06/01/2014   COLONOSCOPY  03/06/2020   PH Adenomatous Polyp; Diverticulosis: CBF 02/2024    REPLACEMENT MITRAL VALVE VIA HEART PORT N/A 04/12/2020   Procedure: REPLACEMENT, MITRAL VALVE, VIA HEARTPORT, WITH CARDIOPULMONARY BYPASS,;  Surgeon: Alford Nancyann BIRCH, MD;  Location:  [facility];  Service: Cardiothoracic;  Laterality: N/A;   COLONOSCOPY     ENDOVENOUS ABLATION LEG VEIN Right    REPAIR RETINAL DETACHMENT BY AIR/GAS     TEMPOROMANDIBULAR JOINT ARTHROPLASTY      FAMILY HISTORY Family History  Problem Relation Name Age of Onset   Cirrhosis Father     Lung cancer Father     Alcohol  abuse Father     Stroke Mother     Dementia Mother     Stroke Brother     Coronary Artery Disease (Blocked arteries around heart) Brother     Coronary Artery Disease (Blocked arteries around heart) Half-Brother     Stroke Half-Brother     Hyperlipidemia (Elevated cholesterol) Half-Sister      SOCIAL HISTORY  Social History   Tobacco Use   Smoking status: Former    Types: Cigarettes    Passive exposure: Never   Smokeless tobacco: Never  Vaping Use   Vaping status: Never Used  Substance Use Topics   Alcohol  use: Not Currently   Drug use: No     REVIEW OF SYSTEMS:  13 system ROS form was given to the patient to complete and I have reviewed it.  The form was sent for scan to the patient's EHR.  Pertinent positives and negatives are mentioned above in the HPI and all other systems are negative.   DATA  I have personally reviewed all of the data outlined below both prior to the appointment and during the appointment with the patient as appropriate.  Appointment on 01/01/2025  Component Date Value Ref Range Status   Glucose 01/01/2025 114 (H)  70 - 110 mg/dL Final   Sodium 98/94/7973 138  136 - 145 mmol/L Final   Potassium 01/01/2025 4.7  3.6 - 5.1 mmol/L Final   Chloride 01/01/2025 103  97 - 109 mmol/L Final   Carbon Dioxide (CO2) 01/01/2025 24.7  22.0 - 32.0 mmol/L Final   Calcium  01/01/2025 9.6  8.7 - 10.3 mg/dL Final   Urea Nitrogen (BUN)  01/01/2025 25  7 - 25 mg/dL Final   Creatinine 98/94/7973 1.3  0.7 - 1.3 mg/dL Final   Glomerular Filtration Rate (eGFR) 01/01/2025 57 (L)  >60 mL/min/1.73sq m Final   BUN/Crea Ratio 01/01/2025 19.2  6.0 - 20.0 Final   Anion Gap w/K 01/01/2025 15.0  6.0 - 16.0 Final   Hemoglobin A1C 01/01/2025 5.5  4.2 - 5.6 % Final   Average Blood Glucose (Calc) 01/01/2025 111  mg/dL Final  Office Visit on 07/21/2024  Component Date Value Ref Range Status   Glucose 07/21/2024 131 (H)  70 - 110 mg/dL Final   Sodium 92/74/7974 142  136 - 145 mmol/L Final   Potassium 07/21/2024 4.5  3.6 - 5.1 mmol/L Final   Chloride 07/21/2024 104  97 - 109 mmol/L Final   Carbon Dioxide (CO2) 07/21/2024 27.9  22.0 - 32.0 mmol/L Final   Urea Nitrogen (BUN) 07/21/2024 19  7 - 25 mg/dL Final   Creatinine 92/74/7974 1.5 (H)  0.7 - 1.3 mg/dL Final   Glomerular Filtration Rate (eGFR) 07/21/2024 49 (L)  >60 mL/min/1.73sq m Final   Calcium  07/21/2024 10.1  8.7 - 10.3 mg/dL Final   AST  92/74/7974 23  8 - 39 U/L Final   ALT  07/21/2024 23  6 - 57 U/L Final   Alk Phos (alkaline Phosphatase) 07/21/2024 81  34 - 104 U/L Final   Albumin  07/21/2024 4.8  3.5 - 4.8 g/dL Final  Bilirubin, Total 07/21/2024 1.1  0.3 - 1.2 mg/dL Final   Protein, Total 07/21/2024 7.4  6.1 - 7.9 g/dL Final   A/G Ratio 92/74/7974 1.8  1.0 - 5.0 gm/dL Final   WBC (White Blood Cell Count) 07/21/2024 7.1  4.1 - 10.2 103/uL Final   RBC (Red Blood Cell Count) 07/21/2024 4.84  4.69 - 6.13 106/uL Final   Hemoglobin 07/21/2024 16.4  14.1 - 18.1 gm/dL Final   Hematocrit 92/74/7974 47.8  40.0 - 52.0 % Final   MCV (Mean Corpuscular Volume) 07/21/2024 98.8  80.0 - 100.0 fl Final   MCH (Mean Corpuscular Hemoglobin) 07/21/2024 33.9 (H)  27.0 - 31.2 pg Final   MCHC (Mean Corpuscular Hemoglobin * 07/21/2024 34.3  32.0 - 36.0 gm/dL Final   Platelet Count 07/21/2024 224  150 - 450 103/uL Final   RDW-CV (Red Cell Distribution Widt* 07/21/2024  12.6  11.6 - 14.8 % Final   MPV (Mean Platelet Volume) 07/21/2024 10.7  9.4 - 12.4 fl Final   Neutrophils 07/21/2024 5.17  1.50 - 7.80 103/uL Final   Lymphocytes 07/21/2024 1.13  1.00 - 3.60 103/uL Final   Monocytes 07/21/2024 0.57  0.00 - 1.50 103/uL Final   Eosinophils 07/21/2024 0.16  0.00 - 0.55 103/uL Final   Basophils 07/21/2024 0.05  0.00 - 0.09 103/uL Final   Neutrophil % 07/21/2024 72.8 (H)  32.0 - 70.0 % Final   Lymphocyte % 07/21/2024 15.9  10.0 - 50.0 % Final   Monocyte % 07/21/2024 8.0  4.0 - 13.0 % Final   Eosinophil % 07/21/2024 2.3  1.0 - 5.0 % Final   Basophil% 07/21/2024 0.7  0.0 - 2.0 % Final   Immature Granulocyte % 07/21/2024 0.3  <=0.7 % Final   Immature Granulocyte Count 07/21/2024 0.02  <=0.06 10^3/L Final   Lipase 07/21/2024 14  11 - 82 U/L Final  Appointment on 07/11/2024  Component Date Value Ref Range Status   Glucose 07/11/2024 124 (H)  70 - 110 mg/dL Final   Sodium 92/84/7974 141  136 - 145 mmol/L Final   Potassium 07/11/2024 4.2  3.6 - 5.1 mmol/L Final   Chloride 07/11/2024 107  97 - 109 mmol/L Final   Carbon Dioxide (CO2) 07/11/2024 26.4  22.0 - 32.0 mmol/L Final   Urea Nitrogen (BUN) 07/11/2024 21  7 - 25 mg/dL Final   Creatinine 92/84/7974 1.6 (H)  0.7 - 1.3 mg/dL Final   Glomerular Filtration Rate (eGFR) 07/11/2024 45 (L)  >60 mL/min/1.73sq m Final   Calcium  07/11/2024 9.6  8.7 - 10.3 mg/dL Final   AST  92/84/7974 22  8 - 39 U/L Final   ALT  07/11/2024 19  6 - 57 U/L Final   Alk Phos (alkaline Phosphatase) 07/11/2024 68  34 - 104 U/L Final   Albumin  07/11/2024 4.2  3.5 - 4.8 g/dL Final   Bilirubin, Total 07/11/2024 0.9  0.3 - 1.2 mg/dL Final   Protein, Total 07/11/2024 6.2  6.1 - 7.9 g/dL Final   A/G Ratio 92/84/7974 2.1  1.0 - 5.0 gm/dL Final   WBC (White Blood Cell Count) 07/11/2024 5.1  4.1 - 10.2 103/uL Final   RBC (Red Blood Cell Count) 07/11/2024 4.04 (L)  4.69 - 6.13 106/uL Final   Hemoglobin  07/11/2024 13.7 (L)  14.1 - 18.1 gm/dL Final   Hematocrit 92/84/7974 40.3  40.0 - 52.0 % Final   MCV (Mean Corpuscular Volume) 07/11/2024 99.8  80.0 - 100.0 fl Final   MCH (Mean Corpuscular Hemoglobin) 07/11/2024  33.9 (H)  27.0 - 31.2 pg Final   MCHC (Mean Corpuscular Hemoglobin * 07/11/2024 34.0  32.0 - 36.0 gm/dL Final   Platelet Count 07/11/2024 206  150 - 450 103/uL Final   RDW-CV (Red Cell Distribution Widt* 07/11/2024 12.4  11.6 - 14.8 % Final   MPV (Mean Platelet Volume) 07/11/2024 10.5  9.4 - 12.4 fl Final   Neutrophils 07/11/2024 3.43  1.50 - 7.80 103/uL Final   Lymphocytes 07/11/2024 0.83 (L)  1.00 - 3.60 103/uL Final   Monocytes 07/11/2024 0.47  0.00 - 1.50 103/uL Final   Eosinophils 07/11/2024 0.31  0.00 - 0.55 103/uL Final   Basophils 07/11/2024 0.04  0.00 - 0.09 103/uL Final   Neutrophil % 07/11/2024 67.2  32.0 - 70.0 % Final   Lymphocyte % 07/11/2024 16.3  10.0 - 50.0 % Final   Monocyte % 07/11/2024 9.2  4.0 - 13.0 % Final   Eosinophil % 07/11/2024 6.1 (H)  1.0 - 5.0 % Final   Basophil% 07/11/2024 0.8  0.0 - 2.0 % Final   Immature Granulocyte % 07/11/2024 0.4  <=0.7 % Final   Immature Granulocyte Count 07/11/2024 0.02  <=0.06 10^3/L Final   Hemoglobin A1C 07/11/2024 5.8 (H)  4.2 - 5.6 % Final   Average Blood Glucose (Calc) 07/11/2024 120  mg/dL Final   Cholesterol, Total 07/11/2024 135  100 - 200 mg/dL Final   Triglyceride 92/84/7974 107  35 - 199 mg/dL Final   HDL (High Density Lipoprotein) Cho* 07/11/2024 28.9 (L)  29.0 - 71.0 mg/dL Final   LDL Calculated 07/11/2024 85  0 - 130 mg/dL Final   VLDL Cholesterol 07/11/2024 21  mg/dL Final   Cholesterol/HDL Ratio 07/11/2024 4.7   Final   PSA (Prostate Specific Antigen), T* 07/11/2024 0.73  0.10 - 4.00 ng/mL Final   Color 07/11/2024 Light Yellow  Colorless, Straw, Light Yellow, Yellow, Dark Yellow Final   Clarity 07/11/2024 Clear  Clear Final   Specific Gravity 07/11/2024 1.014  1.005 -  1.030 Final   pH, Urine 07/11/2024 6.0  5.0 - 8.0 Final   Protein, Urinalysis 07/11/2024 Negative  Negative mg/dL Final   Glucose, Urinalysis 07/11/2024 Negative  Negative mg/dL Final   Ketones, Urinalysis 07/11/2024 Negative  Negative mg/dL Final   Blood, Urinalysis 07/11/2024 Negative  Negative Final   Nitrite, Urinalysis 07/11/2024 Negative  Negative Final   Leukocyte Esterase, Urinalysis 07/11/2024 Negative  Negative Final   Bilirubin, Urinalysis 07/11/2024 Negative  Negative Final   Urobilinogen, Urinalysis 07/11/2024 0.2  0.2 - 1.0 mg/dL Final   WBC, UA 92/84/7974 0  <=5 /hpf Final   Red Blood Cells, Urinalysis 07/11/2024 <1  <=3 /hpf Final   Bacteria, Urinalysis 07/11/2024 0-5  0 - 5 /hpf Final   Squamous Epithelial Cells, Urinaly* 07/11/2024 0  /hpf Final   No follow-ups on file.  Payor: [employer] / Plan: [employer] / Product Type: PPO /   This note is partially written by Greig Pouch, scribe, in the presence of and acting as the scribe of Dr. Arthea Farrow.  I have reviewed, edited and added to the note as needed to reflect my best personal medical judgment.    Dr. Arthea Farrow, MD [facility] [place], [place] Ph:  [phone] Fax:  [phone]

## 2025-01-03 ENCOUNTER — Inpatient Hospital Stay
Admission: EM | Admit: 2025-01-03 | Discharge: 2025-01-28 | DRG: 871 | Disposition: E | Attending: Internal Medicine | Admitting: Internal Medicine

## 2025-01-03 ENCOUNTER — Emergency Department

## 2025-01-03 ENCOUNTER — Encounter: Payer: Self-pay | Admitting: Emergency Medicine

## 2025-01-03 ENCOUNTER — Other Ambulatory Visit: Payer: Self-pay

## 2025-01-03 DIAGNOSIS — R0902 Hypoxemia: Secondary | ICD-10-CM | POA: Diagnosis present

## 2025-01-03 DIAGNOSIS — F0393 Unspecified dementia, unspecified severity, with mood disturbance: Secondary | ICD-10-CM | POA: Diagnosis present

## 2025-01-03 DIAGNOSIS — D696 Thrombocytopenia, unspecified: Secondary | ICD-10-CM | POA: Diagnosis present

## 2025-01-03 DIAGNOSIS — I21A1 Myocardial infarction type 2: Secondary | ICD-10-CM | POA: Diagnosis present

## 2025-01-03 DIAGNOSIS — Z880 Allergy status to penicillin: Secondary | ICD-10-CM

## 2025-01-03 DIAGNOSIS — J9601 Acute respiratory failure with hypoxia: Secondary | ICD-10-CM | POA: Diagnosis present

## 2025-01-03 DIAGNOSIS — F0390 Unspecified dementia without behavioral disturbance: Secondary | ICD-10-CM | POA: Diagnosis not present

## 2025-01-03 DIAGNOSIS — K7200 Acute and subacute hepatic failure without coma: Secondary | ICD-10-CM | POA: Diagnosis present

## 2025-01-03 DIAGNOSIS — L89322 Pressure ulcer of left buttock, stage 2: Secondary | ICD-10-CM | POA: Diagnosis present

## 2025-01-03 DIAGNOSIS — N179 Acute kidney failure, unspecified: Secondary | ICD-10-CM | POA: Diagnosis present

## 2025-01-03 DIAGNOSIS — I214 Non-ST elevation (NSTEMI) myocardial infarction: Secondary | ICD-10-CM | POA: Diagnosis not present

## 2025-01-03 DIAGNOSIS — R570 Cardiogenic shock: Secondary | ICD-10-CM | POA: Diagnosis present

## 2025-01-03 DIAGNOSIS — I342 Nonrheumatic mitral (valve) stenosis: Secondary | ICD-10-CM | POA: Diagnosis present

## 2025-01-03 DIAGNOSIS — I340 Nonrheumatic mitral (valve) insufficiency: Secondary | ICD-10-CM | POA: Diagnosis present

## 2025-01-03 DIAGNOSIS — Z993 Dependence on wheelchair: Secondary | ICD-10-CM

## 2025-01-03 DIAGNOSIS — E872 Acidosis, unspecified: Secondary | ICD-10-CM | POA: Diagnosis present

## 2025-01-03 DIAGNOSIS — I110 Hypertensive heart disease with heart failure: Secondary | ICD-10-CM | POA: Diagnosis present

## 2025-01-03 DIAGNOSIS — L8990 Pressure ulcer of unspecified site, unspecified stage: Secondary | ICD-10-CM | POA: Insufficient documentation

## 2025-01-03 DIAGNOSIS — I69351 Hemiplegia and hemiparesis following cerebral infarction affecting right dominant side: Secondary | ICD-10-CM

## 2025-01-03 DIAGNOSIS — I472 Ventricular tachycardia, unspecified: Secondary | ICD-10-CM | POA: Diagnosis not present

## 2025-01-03 DIAGNOSIS — R1310 Dysphagia, unspecified: Secondary | ICD-10-CM | POA: Diagnosis present

## 2025-01-03 DIAGNOSIS — Z7901 Long term (current) use of anticoagulants: Secondary | ICD-10-CM | POA: Diagnosis not present

## 2025-01-03 DIAGNOSIS — I480 Paroxysmal atrial fibrillation: Secondary | ICD-10-CM | POA: Diagnosis present

## 2025-01-03 DIAGNOSIS — Z7189 Other specified counseling: Secondary | ICD-10-CM | POA: Diagnosis not present

## 2025-01-03 DIAGNOSIS — I5043 Acute on chronic combined systolic (congestive) and diastolic (congestive) heart failure: Secondary | ICD-10-CM | POA: Diagnosis present

## 2025-01-03 DIAGNOSIS — I493 Ventricular premature depolarization: Secondary | ICD-10-CM | POA: Diagnosis present

## 2025-01-03 DIAGNOSIS — Z888 Allergy status to other drugs, medicaments and biological substances status: Secondary | ICD-10-CM

## 2025-01-03 DIAGNOSIS — R4701 Aphasia: Secondary | ICD-10-CM | POA: Diagnosis present

## 2025-01-03 DIAGNOSIS — Z7902 Long term (current) use of antithrombotics/antiplatelets: Secondary | ICD-10-CM

## 2025-01-03 DIAGNOSIS — Z1152 Encounter for screening for COVID-19: Secondary | ICD-10-CM

## 2025-01-03 DIAGNOSIS — Z79899 Other long term (current) drug therapy: Secondary | ICD-10-CM

## 2025-01-03 DIAGNOSIS — E663 Overweight: Secondary | ICD-10-CM | POA: Insufficient documentation

## 2025-01-03 DIAGNOSIS — A419 Sepsis, unspecified organism: Principal | ICD-10-CM | POA: Diagnosis present

## 2025-01-03 DIAGNOSIS — E7800 Pure hypercholesterolemia, unspecified: Secondary | ICD-10-CM | POA: Diagnosis present

## 2025-01-03 DIAGNOSIS — E861 Hypovolemia: Secondary | ICD-10-CM | POA: Diagnosis not present

## 2025-01-03 DIAGNOSIS — R6521 Severe sepsis with septic shock: Secondary | ICD-10-CM | POA: Diagnosis present

## 2025-01-03 DIAGNOSIS — Z515 Encounter for palliative care: Secondary | ICD-10-CM | POA: Diagnosis not present

## 2025-01-03 DIAGNOSIS — K5900 Constipation, unspecified: Secondary | ICD-10-CM | POA: Diagnosis present

## 2025-01-03 DIAGNOSIS — Z953 Presence of xenogenic heart valve: Secondary | ICD-10-CM

## 2025-01-03 DIAGNOSIS — I350 Nonrheumatic aortic (valve) stenosis: Secondary | ICD-10-CM | POA: Diagnosis present

## 2025-01-03 DIAGNOSIS — Z66 Do not resuscitate: Secondary | ICD-10-CM | POA: Diagnosis present

## 2025-01-03 DIAGNOSIS — J690 Pneumonitis due to inhalation of food and vomit: Secondary | ICD-10-CM | POA: Diagnosis present

## 2025-01-03 DIAGNOSIS — I5041 Acute combined systolic (congestive) and diastolic (congestive) heart failure: Secondary | ICD-10-CM | POA: Diagnosis not present

## 2025-01-03 DIAGNOSIS — Z881 Allergy status to other antibiotic agents status: Secondary | ICD-10-CM

## 2025-01-03 DIAGNOSIS — K219 Gastro-esophageal reflux disease without esophagitis: Secondary | ICD-10-CM | POA: Diagnosis present

## 2025-01-03 DIAGNOSIS — Z7401 Bed confinement status: Secondary | ICD-10-CM

## 2025-01-03 DIAGNOSIS — Z91041 Radiographic dye allergy status: Secondary | ICD-10-CM

## 2025-01-03 DIAGNOSIS — Z7982 Long term (current) use of aspirin: Secondary | ICD-10-CM

## 2025-01-03 DIAGNOSIS — R7989 Other specified abnormal findings of blood chemistry: Secondary | ICD-10-CM | POA: Diagnosis not present

## 2025-01-03 DIAGNOSIS — I447 Left bundle-branch block, unspecified: Secondary | ICD-10-CM | POA: Diagnosis present

## 2025-01-03 DIAGNOSIS — I2510 Atherosclerotic heart disease of native coronary artery without angina pectoris: Secondary | ICD-10-CM | POA: Diagnosis present

## 2025-01-03 DIAGNOSIS — Z87891 Personal history of nicotine dependence: Secondary | ICD-10-CM

## 2025-01-03 LAB — CBC WITH DIFFERENTIAL/PLATELET
Abs Immature Granulocytes: 0.05 K/uL (ref 0.00–0.07)
Basophils Absolute: 0 K/uL (ref 0.0–0.1)
Basophils Relative: 0 %
Eosinophils Absolute: 0.1 K/uL (ref 0.0–0.5)
Eosinophils Relative: 1 %
HCT: 34.3 % — ABNORMAL LOW (ref 39.0–52.0)
Hemoglobin: 11.6 g/dL — ABNORMAL LOW (ref 13.0–17.0)
Immature Granulocytes: 1 %
Lymphocytes Relative: 10 %
Lymphs Abs: 0.9 K/uL (ref 0.7–4.0)
MCH: 33.8 pg (ref 26.0–34.0)
MCHC: 33.8 g/dL (ref 30.0–36.0)
MCV: 100 fL (ref 80.0–100.0)
Monocytes Absolute: 0.9 K/uL (ref 0.1–1.0)
Monocytes Relative: 10 %
Neutro Abs: 6.8 K/uL (ref 1.7–7.7)
Neutrophils Relative %: 78 %
Platelets: 139 K/uL — ABNORMAL LOW (ref 150–400)
RBC: 3.43 MIL/uL — ABNORMAL LOW (ref 4.22–5.81)
RDW: 13.9 % (ref 11.5–15.5)
WBC: 8.7 K/uL (ref 4.0–10.5)
nRBC: 0 % (ref 0.0–0.2)

## 2025-01-03 LAB — COMPREHENSIVE METABOLIC PANEL WITH GFR
ALT: 47 U/L — ABNORMAL HIGH (ref 0–44)
AST: 108 U/L — ABNORMAL HIGH (ref 15–41)
Albumin: 3.5 g/dL (ref 3.5–5.0)
Alkaline Phosphatase: 114 U/L (ref 38–126)
Anion gap: 17 — ABNORMAL HIGH (ref 5–15)
BUN: 37 mg/dL — ABNORMAL HIGH (ref 8–23)
CO2: 19 mmol/L — ABNORMAL LOW (ref 22–32)
Calcium: 9.3 mg/dL (ref 8.9–10.3)
Chloride: 100 mmol/L (ref 98–111)
Creatinine, Ser: 1.3 mg/dL — ABNORMAL HIGH (ref 0.61–1.24)
GFR, Estimated: 57 mL/min — ABNORMAL LOW
Glucose, Bld: 110 mg/dL — ABNORMAL HIGH (ref 70–99)
Potassium: 3.5 mmol/L (ref 3.5–5.1)
Sodium: 135 mmol/L (ref 135–145)
Total Bilirubin: 1.4 mg/dL — ABNORMAL HIGH (ref 0.0–1.2)
Total Protein: 6.6 g/dL (ref 6.5–8.1)

## 2025-01-03 LAB — RESP PANEL BY RT-PCR (RSV, FLU A&B, COVID)  RVPGX2
Influenza A by PCR: NEGATIVE
Influenza B by PCR: NEGATIVE
Resp Syncytial Virus by PCR: NEGATIVE
SARS Coronavirus 2 by RT PCR: NEGATIVE

## 2025-01-03 LAB — URINALYSIS, W/ REFLEX TO CULTURE (INFECTION SUSPECTED)
Bilirubin Urine: NEGATIVE
Glucose, UA: NEGATIVE mg/dL
Hgb urine dipstick: NEGATIVE
Ketones, ur: NEGATIVE mg/dL
Leukocytes,Ua: NEGATIVE
Nitrite: NEGATIVE
Protein, ur: NEGATIVE mg/dL
Specific Gravity, Urine: 1.026 (ref 1.005–1.030)
pH: 5 (ref 5.0–8.0)

## 2025-01-03 LAB — LACTIC ACID, PLASMA
Lactic Acid, Venous: 1.9 mmol/L (ref 0.5–1.9)
Lactic Acid, Venous: 2.4 mmol/L (ref 0.5–1.9)
Lactic Acid, Venous: 2.2 mmol/L (ref 0.5–1.9)

## 2025-01-03 LAB — PROTIME-INR
INR: 2.8 — ABNORMAL HIGH (ref 0.8–1.2)
Prothrombin Time: 31.2 s — ABNORMAL HIGH (ref 11.4–15.2)

## 2025-01-03 LAB — PRO BRAIN NATRIURETIC PEPTIDE: Pro Brain Natriuretic Peptide: 15512 pg/mL — ABNORMAL HIGH

## 2025-01-03 MED ORDER — ONDANSETRON HCL 4 MG/2ML IJ SOLN: 4.0000 mg | Freq: Four times a day (QID) | INTRAMUSCULAR | Status: DC | PRN

## 2025-01-03 MED ORDER — ATORVASTATIN CALCIUM 80 MG PO TABS
80.0000 mg | ORAL_TABLET | Freq: Every evening | ORAL | Status: DC
  Administered 2025-01-03: 80 mg via ORAL
  Filled 2025-01-03: qty 4

## 2025-01-03 MED ORDER — LACTATED RINGERS IV BOLUS (SEPSIS)
500.0000 mL | Freq: Once | INTRAVENOUS | Status: AC
  Administered 2025-01-03: 500 mL via INTRAVENOUS

## 2025-01-03 MED ORDER — SODIUM CHLORIDE 0.9 % IV SOLN: 2.0000 g | Freq: Once | INTRAVENOUS | Status: DC

## 2025-01-03 MED ORDER — ASPIRIN 81 MG PO TBEC
81.0000 mg | DELAYED_RELEASE_TABLET | Freq: Every day | ORAL | Status: DC
  Administered 2025-01-04: 81 mg via ORAL
  Filled 2025-01-03: qty 1

## 2025-01-03 MED ORDER — ACETAMINOPHEN 650 MG RE SUPP: 650.0000 mg | Freq: Four times a day (QID) | RECTAL | Status: DC | PRN

## 2025-01-03 MED ORDER — VANCOMYCIN HCL IN DEXTROSE 1-5 GM/200ML-% IV SOLN
1000.0000 mg | Freq: Once | INTRAVENOUS | Status: AC
  Administered 2025-01-03: 1000 mg via INTRAVENOUS
  Filled 2025-01-03: qty 200

## 2025-01-03 MED ORDER — MIRTAZAPINE 15 MG PO TABS
15.0000 mg | ORAL_TABLET | Freq: Every day | ORAL | Status: DC
  Administered 2025-01-03: 15 mg via ORAL
  Filled 2025-01-03: qty 1

## 2025-01-03 MED ORDER — DONEPEZIL HCL 5 MG PO TABS
5.0000 mg | ORAL_TABLET | Freq: Every day | ORAL | Status: DC
  Administered 2025-01-03: 5 mg via ORAL
  Filled 2025-01-03 (×2): qty 1

## 2025-01-03 MED ORDER — MELATONIN 5 MG PO TABS
5.0000 mg | ORAL_TABLET | Freq: Every day | ORAL | Status: DC
  Administered 2025-01-03: 5 mg via ORAL
  Filled 2025-01-03: qty 1

## 2025-01-03 MED ORDER — LACTATED RINGERS IV SOLN
150.0000 mL/h | INTRAVENOUS | Status: DC
  Administered 2025-01-04 (×2): 150 mL/h via INTRAVENOUS

## 2025-01-03 MED ORDER — LACTATED RINGERS IV BOLUS (SEPSIS)
1000.0000 mL | Freq: Once | INTRAVENOUS | Status: AC
  Administered 2025-01-03: 1000 mL via INTRAVENOUS

## 2025-01-03 MED ORDER — VANCOMYCIN HCL IN DEXTROSE 1-5 GM/200ML-% IV SOLN: 1000.0000 mg | Freq: Once | INTRAVENOUS | Status: DC

## 2025-01-03 MED ORDER — METRONIDAZOLE 500 MG/100ML IV SOLN
500.0000 mg | Freq: Once | INTRAVENOUS | Status: AC
  Administered 2025-01-03: 500 mg via INTRAVENOUS
  Filled 2025-01-03: qty 100

## 2025-01-03 MED ORDER — MAGNESIUM HYDROXIDE 400 MG/5ML PO SUSP: 30.0000 mL | Freq: Every day | ORAL | Status: DC | PRN

## 2025-01-03 MED ORDER — ENSURE PLUS HIGH PROTEIN PO LIQD: 237.0000 mL | Freq: Two times a day (BID) | ORAL | Status: DC

## 2025-01-03 MED ORDER — CLOPIDOGREL BISULFATE 75 MG PO TABS
75.0000 mg | ORAL_TABLET | Freq: Every evening | ORAL | Status: DC
  Administered 2025-01-03: 75 mg via ORAL
  Filled 2025-01-03: qty 1

## 2025-01-03 MED ORDER — SODIUM CHLORIDE 0.9 % IV SOLN
2.0000 g | Freq: Once | INTRAVENOUS | Status: AC
  Administered 2025-01-03: 2 g via INTRAVENOUS
  Filled 2025-01-03: qty 10

## 2025-01-03 MED ORDER — VALACYCLOVIR HCL 500 MG PO TABS
500.0000 mg | ORAL_TABLET | Freq: Every day | ORAL | Status: DC
  Administered 2025-01-03: 500 mg via ORAL
  Filled 2025-01-03 (×2): qty 1

## 2025-01-03 MED ORDER — ONDANSETRON HCL 4 MG PO TABS: 4.0000 mg | ORAL_TABLET | Freq: Four times a day (QID) | ORAL | Status: DC | PRN

## 2025-01-03 MED ORDER — ACETAMINOPHEN 325 MG PO TABS: 650.0000 mg | ORAL_TABLET | Freq: Four times a day (QID) | ORAL | Status: DC | PRN

## 2025-01-03 MED ORDER — APIXABAN 5 MG PO TABS
5.0000 mg | ORAL_TABLET | Freq: Two times a day (BID) | ORAL | Status: DC
  Administered 2025-01-03: 5 mg via ORAL
  Filled 2025-01-03: qty 1

## 2025-01-03 MED ORDER — TRAZODONE HCL 50 MG PO TABS: 25.0000 mg | ORAL_TABLET | Freq: Every evening | ORAL | Status: DC | PRN

## 2025-01-03 MED ORDER — APOAEQUORIN 10 MG PO CAPS: 1.0000 | ORAL_CAPSULE | Freq: Every day | ORAL | Status: DC

## 2025-01-03 MED ORDER — ADULT MULTIVITAMIN W/MINERALS CH
1.0000 | ORAL_TABLET | Freq: Every day | ORAL | Status: DC
  Administered 2025-01-04: 1 via ORAL
  Filled 2025-01-03: qty 1

## 2025-01-03 MED ORDER — PANTOPRAZOLE SODIUM 40 MG PO TBEC
40.0000 mg | DELAYED_RELEASE_TABLET | Freq: Every day | ORAL | Status: DC
  Administered 2025-01-04: 40 mg via ORAL
  Filled 2025-01-03: qty 1

## 2025-01-03 MED ORDER — LACTATED RINGERS IV SOLN: INTRAVENOUS | Status: AC

## 2025-01-03 NOTE — H&P (Addendum)
 "     [place]   PATIENT NAME: Terry [name]    MR#:  [id number]  DATE OF BIRTH:  May 09, 1951  DATE OF ADMISSION:  01/03/2025  PRIMARY CARE PHYSICIAN: [name], MD   Patient is coming from: Home  REQUESTING/REFERRING PHYSICIAN: Siadecki, Sebastien, MD  CHIEF COMPLAINT:   Chief Complaint  Patient presents with   Code Sepsis    HISTORY OF PRESENT ILLNESS:  JAYMEN FETCH is a 74 y.o. Caucasian male with medical history significant for hypertension, dyslipidemia, Paroxysmal atrial fibrillation/flutter, GERD, diverticulosis and mitral valve prolapse as well as CVA, presented to the emergency room with acute onset of recently diminished appetite and hypotension that was noted today by speech therapist.  The patient has been having nausea and vomiting and over the last few days he has been having cough and dyspnea.  He has been choking on food.  He admitted to constipation.  He has been having tactile fever and chills.  No chest pain or palpitations.  No dysuria, oliguria or hematuria or flank pain.  ED Course: When he came to the ER, BP was 90/58 with a heart rate of 110 respiratory rate of 22 and pulse symmetry of 100% on 2 L O2 via nasal cannula and later 98% on 3 L.  Labs revealed borderline potassium 3.5 and CO2 19 with a BUN of 37 and creatinine 1.3, gap of 17 and AST 108 and ALT 47 with total bilirubin 0.4.  His proBNP was 84487.  Lactic acid was 2.4 and later 2.2 and CBC showed hemoglobin 11.6 and hematocrit 34.3 and urinalysis was unremarkable.  Respiratory panel came back negative. EKG as reviewed by me : EKG showed sinus tachycardia with rate 104 with PVCs and incomplete left bundle branch block with low voltage QRS. Imaging: Portable chest x-ray showed mild right sided volume loss with stable mild to moderate severity areas of scarring and/or atelectasis within the mid right lung and the right lung base.  The patient was given IV vancomycin  and Azactam  as well as Flagyl  and  will be admitted to a progressive bed for further evaluation and management. PAST MEDICAL HISTORY:   Past Medical History:  Diagnosis Date   Diverticulosis    GERD (gastroesophageal reflux disease)    Heart murmur    Hypercholesteremia    Hypertension    Mitral valve prolapse    Stroke (HCC)   -Paroxysmal atrial fibrillation/flutter,  PAST SURGICAL HISTORY:   Past Surgical History:  Procedure Laterality Date   COLONOSCOPY WITH PROPOFOL  N/A 03/06/2020   Procedure: COLONOSCOPY WITH PROPOFOL ;  Surgeon: Toledo, Ladell MARLA, MD;  Location: [facility] ENDOSCOPY;  Service: Gastroenterology;  Laterality: N/A;   MITRAL VALVE REPLACEMENT     RETINAL DETACHMENT SURGERY     RIGHT/LEFT HEART CATH AND CORONARY ANGIOGRAPHY Bilateral 02/18/2017   Procedure: Right/Left Heart Cath and Coronary Angiography;  Surgeon: Marsa Dooms, MD;  Location: [facility] INVASIVE CV LAB;  Service: Cardiovascular;  Laterality: Bilateral;   RIGHT/LEFT HEART CATH AND CORONARY ANGIOGRAPHY N/A 02/22/2020   Procedure: RIGHT/LEFT HEART CATH AND CORONARY ANGIOGRAPHY;  Surgeon: Dooms Marsa, MD;  Location: [facility] INVASIVE CV LAB;  Service: Cardiovascular;  Laterality: N/A;   TEMPOROMANDIBULAR JOINT SURGERY      SOCIAL HISTORY:   Social History   Tobacco Use   Smoking status: Former    Current packs/day: 0.00    Average packs/day: 1.5 packs/day for 20.0 years (30.0 ttl pk-yrs)    Types: Cigarettes    Start date: 40  Quit date: 49    Years since quitting: 41.0   Smokeless tobacco: Never  Substance Use Topics   Alcohol  use: Yes    Comment: ocassionally    FAMILY HISTORY:   Family History  Problem Relation Age of Onset   Varicose Veins Mother    Cancer Father     DRUG ALLERGIES:  Allergies[1]  REVIEW OF SYSTEMS:   ROS As per history of present illness. All pertinent systems were reviewed above. Constitutional, HEENT, cardiovascular, respiratory, GI, GU, musculoskeletal, neuro, psychiatric, endocrine,  integumentary and hematologic systems were reviewed and are otherwise negative/unremarkable except for positive findings mentioned above in the HPI.   MEDICATIONS AT HOME:   Prior to Admission medications  Medication Sig Start Date End Date Taking? Authorizing Provider  apixaban  (ELIQUIS ) 5 MG TABS tablet Take 5 mg by mouth 2 (two) times daily.   Yes [provider]  Apoaequorin (PREVAGEN) 10 MG CAPS Take 1 capsule by mouth daily.   Yes [provider]  aspirin  EC 81 MG EC tablet Take 1 tablet (81 mg total) by mouth daily. 05/11/20  Yes Maree Hue, MD  atorvastatin  (LIPITOR) 80 MG tablet Take 1 tablet (80 mg total) by mouth daily. 04/03/21  Yes Rojelio Nest, DO  clobetasol  ointment (TEMOVATE ) 0.05 % Apply 1 application topically daily as needed (skin irritation).  01/07/17  Yes [provider]  clopidogrel  (PLAVIX ) 75 MG tablet Take 75 mg by mouth daily.   Yes [provider]  donepezil  (ARICEPT ) 5 MG tablet Take 5 mg by mouth at bedtime. 10/30/24  Yes [provider]  melatonin 5 MG TABS Take 1 tablet (5 mg total) by mouth at bedtime. 08/26/24  Yes Maree Hue, MD  mirtazapine  (REMERON ) 15 MG tablet Take 15 mg by mouth at bedtime. 01/02/25 01/02/26 Yes [provider]  Multiple Vitamin (MULTI-VITAMIN) tablet Take 1 tablet by mouth every morning.   Yes [provider]  Multiple Vitamin (MULTIVITAMIN WITH MINERALS) TABS tablet Take 1 tablet by mouth at bedtime.   Yes [provider]  nortriptyline (PAMELOR) 50 MG capsule Take 50 mg by mouth at bedtime. 08/08/24  Yes [provider]  omeprazole (PRILOSEC) 20 MG capsule Take 20 mg by mouth every evening.  01/11/17  Yes [provider]  polyethylene glycol (MIRALAX / GLYCOLAX) 17 g packet Take 17 g by mouth 2 (two) times daily.   Yes [provider]  valACYclovir  (VALTREX ) 500 MG tablet Take 500 mg by mouth at bedtime. 08/16/24  Yes [provider]   feeding supplement (ENSURE PLUS HIGH PROTEIN) LIQD Take 237 mLs by mouth 2 (two) times daily between meals. 08/26/24   Maree Hue, MD      VITAL SIGNS:  Blood pressure 103/70, pulse (!) 111, temperature (!) 97.5 F (36.4 C), resp. rate (!) 40, weight 81.1 kg, SpO2 95%.  PHYSICAL EXAMINATION:  Physical Exam  GENERAL:  74 y.o.-year-old Caucasian male patient lying in the bed mild to moderate respiratory distress with conversational dyspnea and tachypnea. EYES: Pupils equal, round, reactive to light and accommodation. No scleral icterus. Extraocular muscles intact.  HEENT: Head atraumatic, normocephalic. Oropharynx and nasopharynx clear.  NECK:  Supple, no jugular venous distention. No thyroid enlargement, no tenderness.  LUNGS: Diminished bibasilar breath sounds with bibasal crackles.. No use of accessory muscles of respiration.  CARDIOVASCULAR: Regular rate and rhythm, S1, S2 normal. No murmurs, rubs, or gallops.  ABDOMEN: Soft, nondistended, nontender. Bowel sounds present. No organomegaly or mass.  EXTREMITIES: No  pedal edema, cyanosis, or clubbing.  NEUROLOGIC: Cranial nerves II through XII are intact. Muscle strength 5/5 in all extremities. Sensation intact. Gait not checked.  PSYCHIATRIC: The patient is alert and oriented x 3.  Normal affect and good eye contact. SKIN: No obvious rash, lesion, or ulcer.   LABORATORY PANEL:   CBC Recent Labs  Lab 01/04/25 0423  WBC 8.6  HGB 10.3*  HCT 30.7*  PLT 120*   ------------------------------------------------------------------------------------------------------------------  Chemistries  Recent Labs  Lab 01/03/25 1253 01/04/25 0423  NA 135 134*  K 3.5 4.1  CL 100 100  CO2 19* 17*  GLUCOSE 110* 101*  BUN 37* 33*  CREATININE 1.30* 1.18  CALCIUM  9.3 8.4*  AST 108*  --   ALT 47*  --   ALKPHOS 114  --   BILITOT 1.4*  --     ------------------------------------------------------------------------------------------------------------------  Cardiac Enzymes No results for input(s): TROPONINI in the last 168 hours. ------------------------------------------------------------------------------------------------------------------  RADIOLOGY:  DG Chest Port 1 View Result Date: 01/03/2025 CLINICAL DATA:  Questionable sepsis. EXAM: PORTABLE CHEST 1 VIEW COMPARISON:  May 07, 2020 FINDINGS: The cardiac silhouette is mildly enlarged and unchanged in size. An artificial mitral valve is noted. There is mild right-sided volume loss with stable mild to moderate severity areas of scarring and/or atelectasis is seen within the mid right lung and right lung base. The left lung is clear. No pleural effusion or pneumothorax is identified. Multiple surgical clips are seen overlying the lateral aspect of the upper right hemithorax. The visualized skeletal structures are unremarkable. IMPRESSION: Mild right-sided volume loss with stable mild to moderate severity areas of scarring and/or atelectasis within the mid right lung and right lung base. Electronically Signed   By: Suzen Dials M.D.   On: 01/03/2025 13:25      IMPRESSION AND PLAN:  Assessment and Plan: * Sepsis due to undetermined organism [facility]) - This is likely secondary to aspiration pneumonia. - The patient will be admitted to a progressive unit bed. - Will continue antibiotic therapy with IV vancomycin  and Azactam . - Mucolytic therapy will be provided. - Speech therapy consult to be obtained. - Will keep him n.p.o. for now. - Will follow blood cultures.  Acute on chronic combined systolic and diastolic CHF (congestive heart failure) (HCC) - This could be a manifestation of cor pulmonale. - He will be diuresed with IV Lasix . - Will follow ins and outs. - Will monitor his tachypnea and hypoxia. - His last 2D echo on 08/18/2024 revealed EF of 45% with grade 1  diastolic dysfunction.  It also showed mild mitral valve regurgitation and moderate severe mitral stenosis with trivial aortic regurgitation and moderate aortic valve stenosis.  Paroxysmal atrial fibrillation (HCC) - Will continue Eliquis .  Dementia without behavioral disturbance (HCC) - Will continue Aricept   GERD without esophagitis - Continue PPI therapy.   DVT prophylaxis: Lovenox . Advanced Care Planning:  Code Status: full code. Family Communication:  The plan of care was discussed in details with the patient (and family). I answered all questions. The patient agreed to proceed with the above mentioned plan. Further management will depend upon hospital course. Disposition Plan: Back to previous home environment Consults called: none. All the records are reviewed and case discussed with ED provider.  Status is: Inpatient   At the time of the admission, it appears that the appropriate admission status for this patient is inpatient.  This is judged to be reasonable and necessary in order to provide the required intensity of service to  ensure the patient's safety given the presenting symptoms, physical exam findings and initial radiographic and laboratory data in the context of comorbid conditions.  The patient requires inpatient status due to high intensity of service, high risk of further deterioration and high frequency of surveillance required.  I certify that at the time of admission, it is my clinical judgment that the patient will require inpatient hospital care extending more than 2 midnights.                            Dispo: The patient is from: Home              Anticipated d/c is to: Home              Patient currently is not medically stable to d/c.              Difficult to place patient: No  [place]ison DELENA Peaches M.D on 01/04/2025 at 5:24 AM  [facility]   From 7 PM-7 AM, contact night-coverage [email or web address]  CC: Primary care physician; [name], MD      [1]  Allergies Allergen Reactions   Penicillins Shortness Of Breath    Did it involve swelling of the face/tongue/throat, SOB, or low BP? Yes Did it involve sudden or severe rash/hives, skin peeling, or any reaction on the inside of your mouth or nose? Unknown Did you need to seek medical attention at a hospital or doctor's office? Was at a clinic when reaction occurred When did it last happen? More than 50 years ago If all above answers are NO, may proceed with cephalosporin use.    Cetirizine Dermatitis and Other (See Comments)   Ciprofloxacin Swelling and Rash   Ivp Dye [Iodinated Contrast Media] Rash   Tretinoin Dermatitis and Other (See Comments)   "

## 2025-01-03 NOTE — Consult Note (Signed)
 CODE SEPSIS - PHARMACY COMMUNICATION  **Broad Spectrum Antibiotics should be administered within 1 hour of Sepsis diagnosis**  Time Code Sepsis Called/Page Received: 1247  Antibiotics Ordered: aztreonam , metronidazole , vancomycin    Time of 1st antibiotic administration: 1317  Additional action taken by pharmacy: none  If necessary, Name of Provider/Nurse Contacted: n/a    Annabella LOISE Banks ,PharmD Clinical Pharmacist  01/03/2025  12:58 PM

## 2025-01-03 NOTE — ED Notes (Signed)
 IV team unable to obtain 2nd line via US  due to poor vascular access

## 2025-01-03 NOTE — Sepsis Progress Note (Signed)
 Sepsis protocol is being followed by [facility].

## 2025-01-03 NOTE — Progress Notes (Addendum)
 IV team consulted for 2nd PIV access per sepsis protocol. Right extremity restricted due to paralysis. Left arm assessed with US . 1 unsuccessful attempt in LFA. Veins small and deep. Did not see another vein suitable for PIV. RN aware.

## 2025-01-03 NOTE — Progress Notes (Signed)
 MD requested respiratory to assess patient for bipap. Patient in no distress, sating 100% and able to have conversation without getting short of breath. Bipap not needed at this time. MD notified. RN made aware to call respiratory if anything changes.

## 2025-01-03 NOTE — ED Provider Notes (Signed)
 "  [facility] Provider Note    Event Date/Time   First MD Initiated Contact with Patient 01/03/25 1237     (approximate)   History   Code Sepsis   HPI  Terry [name] is a 74 y.o. male with a past medical history of CVA with residual right-sided weakness, A-fib currently on anticoagulation, hypertension, presenting to the emergency department via EMS from home for shortness of breath.  Patient reports that the symptoms come on when he exerts himself.  They were concerned for pneumonia and so had a chest x-ray yesterday but they do not know the results.  EMS reports that the patient's oxygen saturation was 89% on room air when he was exerting himself.  They placed patient on 2 L nasal cannula which improved him to 100%.  He denies any cough, congestion, chest pain, nausea, vomiting, or diarrhea.  Son reports that the patient has difficulty with eating and drinking that he may have aspirated.     Physical Exam   Triage Vital Signs: ED Triage Vitals [01/03/25 1231]  Encounter Vitals Group     BP (!) 90/58     Girls Systolic BP Percentile      Girls Diastolic BP Percentile      Boys Systolic BP Percentile      Boys Diastolic BP Percentile      Pulse Rate (!) 110     Resp (!) 22     Temp      Temp src      SpO2 100 %     Weight      Height      Head Circumference      Peak Flow      Pain Score      Pain Loc      Pain Education      Exclude from Growth Chart     Most recent vital signs: Vitals:   01/03/25 1231  BP: (!) 90/58  Pulse: (!) 110  Resp: (!) 22  SpO2: 100%     General: Awake, no distress.  CV:  Good peripheral perfusion.  Resp:  Normal effort.  Abd:  No distention.  Other:     ED Results / Procedures / Treatments   Labs (all labs ordered are listed, but only abnormal results are displayed) Labs Reviewed  RESP PANEL BY RT-PCR (RSV, FLU A&B, COVID)  [id number]  CULTURE, BLOOD (ROUTINE X 2)  CULTURE, BLOOD (ROUTINE X 2)  LACTIC  ACID, PLASMA  LACTIC ACID, PLASMA  COMPREHENSIVE METABOLIC PANEL WITH GFR  CBC WITH DIFFERENTIAL/PLATELET  PROTIME-INR  URINALYSIS, W/ REFLEX TO CULTURE (INFECTION SUSPECTED)     EKG  ED ECG REPORT I, Reche CHRISTELLA Leventhal, the attending physician, personally viewed and interpreted this ECG.  Date: 01/03/2025 Rate: 104 bpm Rhythm: Sinus tachycardia QRS Axis: normal Intervals: normal ST/T Wave abnormalities: normal Narrative Interpretation: no evidence of acute ischemia    RADIOLOGY IMPRESSION: Mild right-sided volume loss with stable mild to moderate severity areas of scarring and/or atelectasis within the mid right lung and right lung base.    PROCEDURES:  Critical Care performed: No  Procedures   MEDICATIONS ORDERED IN ED: Medications  lactated ringers  infusion (has no administration in time range)  lactated ringers  bolus 1,000 mL (has no administration in time range)    And  lactated ringers  bolus 1,000 mL (has no administration in time range)    And  lactated ringers  bolus 500 mL (has no administration in  time range)  aztreonam  (AZACTAM ) 2 g in sodium chloride  0.9 % 100 mL IVPB (has no administration in time range)  metroNIDAZOLE  (FLAGYL ) IVPB 500 mg (has no administration in time range)  vancomycin  (VANCOCIN ) IVPB 1000 mg/200 mL premix (has no administration in time range)     IMPRESSION / MDM / ASSESSMENT AND PLAN / ED COURSE  I reviewed the triage vital signs and the nursing notes.                              Differential includes, but is not limited to, viral syndrome, bronchitis including COPD exacerbation, pneumonia, reactive airway disease including asthma, CHF including exacerbation with or without pulmonary/interstitial edema, pneumothorax, ACS, thoracic trauma, and pulmonary embolism.   Patient's presentation is most consistent with acute presentation with potential threat to life or bodily function.  Patient is a 74 year old male with a past  medical history of CVA with residual right-sided weakness, A-fib currently on anticoagulation, hypertension, presenting to the emergency department via EMS from home for shortness of breath.  On arrival the patient was noted to be tachycardic, tachypneic, and hypotensive.  Sepsis protocol initiated.  Chest x-ray does not show any acute pathology.  EKG shows a sinus tachycardia but is otherwise unremarkable.  Lab work patient has a normal white blood cell count and normal lactic acid.  BNP added to workup.  Patient's care was signed out to the oncoming provider pending completion of workup and disposition.     FINAL CLINICAL IMPRESSION(S) / ED DIAGNOSES   Final diagnoses:  Hypoxia     Rx / DC Orders   ED Discharge Orders     None        Note:  This document was prepared using Dragon voice recognition software and may include unintentional dictation errors.   Rexford Reche HERO, MD 01/03/25 [phone]  "

## 2025-01-03 NOTE — ED Notes (Signed)
 Pt found to have pulled his IV out. Pt cleaned. Attempt x2 made to replace with no success. New order for IV team placed

## 2025-01-03 NOTE — ED Triage Notes (Signed)
 First Nurse Note;  Pt via [facility] from home. Pt c/o SOB, O2 sat decreases with exertion. XR was done yesterday for possible PNA but results hasn't finalized. Pt is A&Ox4 and NAD EMS reports:  fluid 89% on RA with exertion, 100% on 2L [place]  84/57 BP  107 HR  162 CBG  12 lead ST with frequent PVC

## 2025-01-03 NOTE — ED Provider Notes (Signed)
----------------------------------------- °  9:09 PM on 01/03/2025 -----------------------------------------  I took over care of this patient from Dr. Rexford.  The initial lactate was slightly elevated although the results are somewhat confusing since a lactate resulted from the same time both 2.4 and 1.9.  On multiple reassessments, the patient still is somewhat tachypneic but appears relatively comfortable and is not in any respiratory distress.  He is still on 2 L O2 by nasal cannula.  His blood pressure has slowly improved.  BNP is elevated, however the x-ray does not show evidence of fluid overload.  Respiratory panel is negative.  He will need admission for further management.  I consulted Dr. Lawence from the hospitalist service; based on our discussion he agrees to evaluate the patient for admission.   Jacolyn Pae, MD 01/03/25 2147

## 2025-01-04 ENCOUNTER — Inpatient Hospital Stay

## 2025-01-04 DIAGNOSIS — F0390 Unspecified dementia without behavioral disturbance: Secondary | ICD-10-CM | POA: Insufficient documentation

## 2025-01-04 DIAGNOSIS — K219 Gastro-esophageal reflux disease without esophagitis: Secondary | ICD-10-CM | POA: Insufficient documentation

## 2025-01-04 DIAGNOSIS — I5043 Acute on chronic combined systolic (congestive) and diastolic (congestive) heart failure: Secondary | ICD-10-CM | POA: Diagnosis not present

## 2025-01-04 DIAGNOSIS — I480 Paroxysmal atrial fibrillation: Secondary | ICD-10-CM | POA: Diagnosis not present

## 2025-01-04 DIAGNOSIS — A419 Sepsis, unspecified organism: Secondary | ICD-10-CM | POA: Diagnosis not present

## 2025-01-04 LAB — CBC
HCT: 30.7 % — ABNORMAL LOW (ref 39.0–52.0)
Hemoglobin: 10.3 g/dL — ABNORMAL LOW (ref 13.0–17.0)
MCH: 33.9 pg (ref 26.0–34.0)
MCHC: 33.6 g/dL (ref 30.0–36.0)
MCV: 101 fL — ABNORMAL HIGH (ref 80.0–100.0)
Platelets: 120 K/uL — ABNORMAL LOW (ref 150–400)
RBC: 3.04 MIL/uL — ABNORMAL LOW (ref 4.22–5.81)
RDW: 13.9 % (ref 11.5–15.5)
WBC: 8.6 K/uL (ref 4.0–10.5)
nRBC: 0 % (ref 0.0–0.2)

## 2025-01-04 LAB — BASIC METABOLIC PANEL WITH GFR
Anion gap: 16 — ABNORMAL HIGH (ref 5–15)
BUN: 33 mg/dL — ABNORMAL HIGH (ref 8–23)
CO2: 17 mmol/L — ABNORMAL LOW (ref 22–32)
Calcium: 8.4 mg/dL — ABNORMAL LOW (ref 8.9–10.3)
Chloride: 100 mmol/L (ref 98–111)
Creatinine, Ser: 1.18 mg/dL (ref 0.61–1.24)
GFR, Estimated: >60 mL/min
Glucose, Bld: 101 mg/dL — ABNORMAL HIGH (ref 70–99)
Potassium: 4.1 mmol/L (ref 3.5–5.1)
Sodium: 134 mmol/L — ABNORMAL LOW (ref 135–145)

## 2025-01-04 LAB — LACTIC ACID, PLASMA: Lactic Acid, Venous: 2.8 mmol/L (ref 0.5–1.9)

## 2025-01-04 LAB — D-DIMER, QUANTITATIVE: D-Dimer, Quant: 1.4 ug{FEU}/mL — ABNORMAL HIGH (ref 0.00–0.50)

## 2025-01-04 LAB — CORTISOL-AM, BLOOD: Cortisol - AM: 22 ug/dL (ref 6.7–22.6)

## 2025-01-04 LAB — PROTIME-INR
INR: 3.7 — ABNORMAL HIGH (ref 0.8–1.2)
Prothrombin Time: 38.6 s — ABNORMAL HIGH (ref 11.4–15.2)

## 2025-01-04 LAB — MRSA NEXT GEN BY PCR, NASAL: MRSA by PCR Next Gen: NOT DETECTED

## 2025-01-04 LAB — PROCALCITONIN: Procalcitonin: 0.15 ng/mL

## 2025-01-04 MED ORDER — SODIUM CHLORIDE 0.9 % IV SOLN
2.0000 g | Freq: Three times a day (TID) | INTRAVENOUS | Status: DC
  Administered 2025-01-04 – 2025-01-05 (×5): 2 g via INTRAVENOUS
  Filled 2025-01-04 (×7): qty 10

## 2025-01-04 MED ORDER — ALBUMIN HUMAN 25 % IV SOLN
50.0000 g | Freq: Once | INTRAVENOUS | Status: AC
  Administered 2025-01-04: 12.5 g via INTRAVENOUS
  Filled 2025-01-04: qty 200

## 2025-01-04 MED ORDER — VANCOMYCIN HCL 1250 MG/250ML IV SOLN
1250.0000 mg | INTRAVENOUS | Status: DC
  Administered 2025-01-04 – 2025-01-05 (×2): 1250 mg via INTRAVENOUS
  Filled 2025-01-04 (×2): qty 250

## 2025-01-04 MED ORDER — FUROSEMIDE 10 MG/ML IJ SOLN
40.0000 mg | Freq: Two times a day (BID) | INTRAMUSCULAR | Status: DC
  Administered 2025-01-04 (×2): 40 mg via INTRAVENOUS
  Filled 2025-01-04 (×2): qty 4

## 2025-01-04 NOTE — Progress Notes (Signed)
 This RN assessed patient's sacrum upon admission to find a stage 1 sacral wound. Dr. Laurita made aware of findings. Wound care to be consulted.

## 2025-01-04 NOTE — Assessment & Plan Note (Signed)
-   Will continue Aricept .

## 2025-01-04 NOTE — Plan of Care (Signed)
   Problem: Education: Goal: Knowledge of General Education information will improve Description Including pain rating scale, medication(s)/side effects and non-pharmacologic comfort measures Outcome: Progressing

## 2025-01-04 NOTE — Progress Notes (Signed)
 IV team consulted for difficult stick. Pt had been assessed by IV team previously and no suitable vasculature was visualized w/ US . RN reached out to MD for consideration of central line placement.

## 2025-01-04 NOTE — Progress Notes (Signed)
 Pharmacy Antibiotic Note  Terry [name] is a 74 y.o. male admitted on 01/03/2025 with pneumonia.  Pharmacy has been consulted for Vancomycin  dosing for 7 days.  Plan: Pt given Vancomycin  1000 mg once. Vancomycin  1250 mg IV Q 24 hrs. Goal AUC 400-550. Expected AUC: 503.7 SCr used: 1.3  Follow up culture results to assess for antibiotic optimization. Monitor renal function to assess for any necessary antibiotic dosing changes. Pharmacy will continue to follow and will adjust abx dosing whenever warranted.  Temp (24hrs), Avg:97.3 F (36.3 C), Min:97 F (36.1 C), Max:97.4 F (36.3 C)   Recent Labs  Lab 01/03/25 1253 01/03/25 1254 01/03/25 1919  WBC 8.7  --   --   CREATININE 1.30*  --   --   LATICACIDVEN  --  2.4*  1.9 2.2*    Estimated Creatinine Clearance: 50.1 mL/min (A) (by C-G formula based on SCr of 1.3 mg/dL (H)).    Allergies[1]  Antimicrobials this admission: 01/07 Vancomycin  >>  01/07 Flagyl  >> x 1 dose 01/07 Aztreonam  >> x 7 days  Microbiology results: 01/07 BCx: Pending  Thank you for allowing pharmacy to be a part of this patients care.  Rankin CANDIE Dills, PharmD, [facility] 01/04/2025 12:38 AM     [1]  Allergies Allergen Reactions   Penicillins Shortness Of Breath    Did it involve swelling of the face/tongue/throat, SOB, or low BP? Yes Did it involve sudden or severe rash/hives, skin peeling, or any reaction on the inside of your mouth or nose? Unknown Did you need to seek medical attention at a hospital or doctor's office? Was at a clinic when reaction occurred When did it last happen? More than 50 years ago If all above answers are NO, may proceed with cephalosporin use.    Cetirizine Dermatitis and Other (See Comments)   Ciprofloxacin Swelling and Rash   Ivp Dye [Iodinated Contrast Media] Rash   Tretinoin Dermatitis and Other (See Comments)

## 2025-01-04 NOTE — Progress Notes (Addendum)
 " Progress Note   Patient: Terry [name] FMW:[id number] DOB: 07/23/1951 DOA: 01/03/2025     1 DOS: the patient was seen and examined on 01/04/2025   Brief hospital course: FELDER LEBEDA is a 74 y.o. Caucasian male with medical history significant for hypertension, dyslipidemia, Paroxysmal atrial fibrillation/flutter, GERD, diverticulosis and mitral valve prolapse as well as CVA, presented to the emergency room with acute onset of recently diminished appetite and hypotension that was noted today by speech therapist.  Per patient brother, patient also has been short of breath for the past week. Patient was placed on antibiotics for possible aspiration pneumonia and IV Lasix  for exacerbation of congestive heart failure.   Principal Problem:   Sepsis due to undetermined organism [facility]) Active Problems:   Acute on chronic combined systolic and diastolic CHF (congestive heart failure) (HCC)   Paroxysmal atrial fibrillation (HCC)   GERD without esophagitis   Dementia without behavioral disturbance (HCC)   Assessment and Plan: *Severe sepsis due to undetermined organism (HCC)  aspiration pneumonia of both lower lobes Dysphagia. Patient has significant tachycardia, tachypnea, lactic acidosis.  Most likely due to aspiration pneumonia of bilateral lower lobes.  Patient is placed on broad-spectrum antibiotics.  Will continue closely. Patient has been be seen by speech therapy, deemed not safe to start a diet.  Will continue n.p.o. for now.  Acute on chronic combined systolic and diastolic CHF (congestive heart failure) (HCC) Likely cardiogenic shock with hypotension. Moderate to severe mitral stenosis. Moderate aortic stenosis. - His last 2D echo on 08/18/2024 revealed EF of 45% with grade 1 diastolic dysfunction.  It also showed mild mitral valve regurgitation and moderate severe mitral stenosis with trivial aortic regurgitation and moderate aortic valve stenosis. Currently, he has evidence of volume  overload, procalcitonin level 15,512, shortness of breath with exertion, mild leg edema. He also had intermittent hypotension, lactic acidosis, hypothermia, this is consistent with cardiogenic shock.  Discussed with cardiology about this, prefer not to take a aggressive approach.  Patient heart failure probably is end-stage, will obtain palliative care consult. In the meantime, patient will be given 50 g albumin  to stabilize blood pressure and improve perfusion.  In the meantime, patient will be treated with diuretics. Patient condition is critical, very high risk of dying.  Paroxysmal atrial fibrillation (HCC) with RVR. - Will continue Eliquis . Currently heart rate is around 110, not safe to start beta-blocker or calcium  channel blocker.  Dementia without behavioral disturbance (HCC) - Will continue Aricept   GERD without esophagitis - Continue PPI therapy.  History of stroke with right hemiparesis. Severe debility with bedbound status. Poor prognosis, Perative care consult obtained.      Subjective:  Patient has some short of breath, blood pressure still low.  No cough.  Has significant aphasia.  Physical Exam: Vitals:   01/04/25 0624 01/04/25 0710 01/04/25 0830 01/04/25 1050  BP:   93/76 102/69  Pulse:   (!) 110 (!) 110  Resp:   (!) 26 (!) 23  Temp: 97.7 F (36.5 C)   97.9 F (36.6 C)  TempSrc: Oral   Oral  SpO2:  96% 95% 98%  Weight:       General exam: Appears calm and comfortable  Respiratory system: Clear to auscultation. Respiratory effort normal. Cardiovascular system: Irregularly irregular tachycardia no JVD, murmurs, rubs, gallops or clicks. No pedal edema. Gastrointestinal system: Abdomen is nondistended, soft and nontender. No organomegaly or masses felt. Normal bowel sounds heard. Central nervous system: Alert and oriented x2.  Right  hemiparesis. Extremities: Symmetric 5 x 5 power. Skin: No rashes, lesions or ulcers Psychiatry: Judgement and insight appear  normal. Mood & affect appropriate.    Data Reviewed:  Lab results, chest x-ray.  Prior echocardiogram results.  Family Communication: Brother and sister updated at bedside.  Disposition: Status is: Inpatient Remains inpatient appropriate because: Severity of disease, IV treatment     Time spent: 50 minutes  Author: Murvin Mana, MD 01/04/2025 12:51 PM  For on call review [email or web address].    "

## 2025-01-04 NOTE — Assessment & Plan Note (Signed)
Will continue Eliquis. 

## 2025-01-04 NOTE — Plan of Care (Signed)
   Problem: Fluid Volume: Goal: Hemodynamic stability will improve Outcome: Progressing   Problem: Clinical Measurements: Goal: Diagnostic test results will improve Outcome: Progressing Goal: Signs and symptoms of infection will decrease Outcome: Progressing   Problem: Respiratory: Goal: Ability to maintain adequate ventilation will improve Outcome: Progressing

## 2025-01-04 NOTE — Assessment & Plan Note (Signed)
-   This is likely secondary to aspiration pneumonia. - The patient will be admitted to a progressive unit bed. - Will continue antibiotic therapy with IV vancomycin  and Azactam . - Mucolytic therapy will be provided. - Speech therapy consult to be obtained. - Will keep him n.p.o. for now. - Will follow blood cultures.

## 2025-01-04 NOTE — ED Notes (Signed)
Pt brief and bedding changed 

## 2025-01-04 NOTE — Hospital Course (Addendum)
 Terry [name] is a 74 y.o. Caucasian male with medical history significant for hypertension, dyslipidemia, Paroxysmal atrial fibrillation/flutter, GERD, diverticulosis and mitral valve prolapse as well as CVA, presented to the emergency room with acute onset of recently diminished appetite and hypotension that was noted today by speech therapist.  Per patient brother, patient also has been short of breath for the past week. Patient was placed on antibiotics for possible aspiration pneumonia and IV Lasix  for exacerbation of congestive heart failure.  Patient had evidence of cardiogenic shock with hypotension, lactic acidosis, hypothermia.  Patient was placed on broad-spectrum antibiotics for possible aspiration, also started on IV Lasix . Overnight on 1/8 patient developed severe respite distress, procalcitonin level went up to 25,000, also developed liver function changes.  Transferred to stepdown unit.

## 2025-01-04 NOTE — Assessment & Plan Note (Addendum)
-   This could be a manifestation of cor pulmonale. - He will be diuresed with IV Lasix . - Will follow ins and outs. - Will monitor his tachypnea and hypoxia. - His last 2D echo on 08/18/2024 revealed EF of 45% with grade 1 diastolic dysfunction.  It also showed mild mitral valve regurgitation and moderate severe mitral stenosis with trivial aortic regurgitation and moderate aortic valve stenosis.

## 2025-01-04 NOTE — ED Notes (Addendum)
 Pt brief cleaned of stool and urine at this time, new brief placed on pt. Pt repositioned in bed. Call bell within reach, bed alarm on. Pt spoke to sister on phone who is supposed to be coming to visit this morning. Pt denies needs at this time

## 2025-01-04 NOTE — Assessment & Plan Note (Signed)
 Continue PPI therapy.

## 2025-01-04 NOTE — ED Notes (Signed)
 Pt noted to have scooted down in the bed, states he was needing to get up to use the restroom. After repositioning pt with additional RN, pt noted to have a soiled brief and linens. Pt cleaned and changed, repositioned into bed, bed alarm ensured to be on and functional. Call bell within reach,

## 2025-01-04 NOTE — ED Notes (Signed)
 Pharmacy notified of pt's missing dose of aztreonam 

## 2025-01-04 NOTE — Consult Note (Signed)
 " [facility] CLINIC CARDIOLOGY CONSULT NOTE       Patient ID: Terry [name] MRN: [id number] DOB/AGE: 76-20-1952 76 y.o.  Admit date: 01/03/2025 Referring Physician Dr. Murvin Mana Primary Physician [name], MD  Primary Cardiologist Dr. Ammon Reason for Consultation AoCHF  HPI: Terry [name] is a 74 y.o. male  with a past medical history of insignificant CAD by [facility] 2018, history of severe MR with bioprosthetic mitral valve placed 2021, history of postoperative atrial fibrillation, history of CVA, hypertension who presented to the ED on 01/03/2025 for decreased appetite and hypotension.  Concern for acute heart failure this a.m. with elevated BNP.  Cardiology was consulted for further evaluation.   Patient brought in for evaluation of SOB, poor appetite, hypotension. Workup in the ED notable for creatinine 1.30, potassium 3.5, hemoglobin 11.6, WBC 8.7. BNP 15,512. EKG in the ED sinus tachycardia, PVCs rate 104 bpm.  Chest x-ray with mild right sided volume loss with stable mild to moderate scarring/atelectasis of right lung.  Also some concern for aspiration pneumonia.  Patient seen and examined this morning, sitting upright in ED stretcher with family at bedside.  We discussed his symptoms in further detail.  He endorses being somewhat confused over the last few days and was quite weak and tired.  Family noticed he was more short of breath.  He also endorses some occasional lightheadedness and dizziness.  He does deny any episodes of chest discomfort.  Overall history is limited due to his baseline mental status.  Review of systems complete and found to be negative unless listed above    Past Medical History:  Diagnosis Date   Diverticulosis    GERD (gastroesophageal reflux disease)    Heart murmur    Hypercholesteremia    Hypertension    Mitral valve prolapse    Stroke [facility])     Past Surgical History:  Procedure Laterality Date   COLONOSCOPY WITH PROPOFOL  N/A 03/06/2020    Procedure: COLONOSCOPY WITH PROPOFOL ;  Surgeon: Toledo, Ladell MARLA, MD;  Location: [facility] ENDOSCOPY;  Service: Gastroenterology;  Laterality: N/A;   MITRAL VALVE REPLACEMENT     RETINAL DETACHMENT SURGERY     RIGHT/LEFT HEART CATH AND CORONARY ANGIOGRAPHY Bilateral 02/18/2017   Procedure: Right/Left Heart Cath and Coronary Angiography;  Surgeon: Marsa Ammon, MD;  Location: [facility] INVASIVE CV LAB;  Service: Cardiovascular;  Laterality: Bilateral;   RIGHT/LEFT HEART CATH AND CORONARY ANGIOGRAPHY N/A 02/22/2020   Procedure: RIGHT/LEFT HEART CATH AND CORONARY ANGIOGRAPHY;  Surgeon: Ammon Marsa, MD;  Location: [facility] INVASIVE CV LAB;  Service: Cardiovascular;  Laterality: N/A;   TEMPOROMANDIBULAR JOINT SURGERY      (Not in a hospital admission)  Social History   Socioeconomic History   Marital status: Married    Spouse name: Not on file   Number of children: Not on file   Years of education: Not on file   Highest education level: Not on file  Occupational History   Not on file  Tobacco Use   Smoking status: Former    Current packs/day: 0.00    Average packs/day: 1.5 packs/day for 20.0 years (30.0 ttl pk-yrs)    Types: Cigarettes    Start date: 34    Quit date: 61    Years since quitting: 41.0   Smokeless tobacco: Never  Vaping Use   Vaping status: Never Used  Substance and Sexual Activity   Alcohol  use: Yes    Comment: ocassionally   Drug use: No   Sexual activity: Not  on file  Other Topics Concern   Not on file  Social History Narrative   Not on file   Social Drivers of Health   Tobacco Use: Medium Risk (01/03/2025)   Patient History    Smoking Tobacco Use: Former    Smokeless Tobacco Use: Never    Passive Exposure: Not on file  Financial Resource Strain: Low Risk  (07/18/2024)   Received from [facility]   Overall Financial Resource Strain (CARDIA)    Difficulty of Paying Living Expenses: Not hard at all  Food Insecurity: No Food Insecurity  (08/18/2024)   Epic    Worried About Running Out of Food in the Last Year: Never true    Ran Out of Food in the Last Year: Never true  Transportation Needs: No Transportation Needs (08/18/2024)   Epic    Lack of Transportation (Medical): No    Lack of Transportation (Non-Medical): No  Physical Activity: Not on file  Stress: Not on file  Social Connections: Socially Integrated (08/18/2024)   Social Connection and Isolation Panel    Frequency of Communication with Friends and Family: More than three times a week    Frequency of Social Gatherings with Friends and Family: Three times a week    Attends Religious Services: More than 4 times per year    Active Member of Clubs or Organizations: Yes    Attends Banker Meetings: 1 to 4 times per year    Marital Status: Married  Catering Manager Violence: Not At Risk (08/18/2024)   Epic    Fear of Current or Ex-Partner: No    Emotionally Abused: No    Physically Abused: No    Sexually Abused: No  Depression (PHQ2-9): Low Risk (10/13/2024)   Depression (PHQ2-9)    PHQ-2 Score: 0  Alcohol  Screen: Not on file  Housing: Unknown (08/18/2024)   Epic    Unable to Pay for Housing in the Last Year: Not on file    Number of Times Moved in the Last Year: 0    Homeless in the Last Year: Not on file  Utilities: Not At Risk (08/18/2024)   Epic    Threatened with loss of utilities: No  Health Literacy: Not on file    Family History  Problem Relation Age of Onset   Varicose Veins Mother    Cancer Father      Vitals:   01/04/25 0624 01/04/25 0710 01/04/25 0830 01/04/25 1050  BP:   93/76 102/69  Pulse:   (!) 110 (!) 110  Resp:   (!) 26 (!) 23  Temp: 97.7 F (36.5 C)   97.9 F (36.6 C)  TempSrc: Oral   Oral  SpO2:  96% 95% 98%  Weight:        PHYSICAL EXAM General: Chronically ill-appearing elderly male, well nourished, in no acute distress. HEENT: Normocephalic and atraumatic. Neck: No JVD.  Lungs: Normal respiratory effort on  3L [place]. Clear bilaterally to auscultation. No wheezes, crackles, rhonchi.  Heart: HRRR. Normal S1 and S2 without gallops or murmurs.  Abdomen: Non-distended appearing.  Msk: Normal strength and tone for age. Extremities: Warm and well perfused. No clubbing, cyanosis.  3+ pitting edema.  Neuro: Alert and oriented X 3. Psych: Answers questions appropriately.   Labs: Basic Metabolic Panel: Recent Labs    01/03/25 1253 01/04/25 0423  NA 135 134*  K 3.5 4.1  CL 100 100  CO2 19* 17*  GLUCOSE 110* 101*  BUN 37* 33*  CREATININE 1.30* 1.18  CALCIUM  9.3 8.4*   Liver Function Tests: Recent Labs    01/03/25 1253  AST 108*  ALT 47*  ALKPHOS 114  BILITOT 1.4*  PROT 6.6  ALBUMIN  3.5   No results for input(s): LIPASE, AMYLASE in the last 72 hours. CBC: Recent Labs    01/03/25 1253 01/04/25 0423  WBC 8.7 8.6  NEUTROABS 6.8  --   HGB 11.6* 10.3*  HCT 34.3* 30.7*  MCV 100.0 101.0*  PLT 139* 120*   Cardiac Enzymes: No results for input(s): CKTOTAL, CKMB, CKMBINDEX, TROPONINIHS in the last 72 hours. BNP: No results for input(s): BNP in the last 72 hours. D-Dimer: No results for input(s): DDIMER in the last 72 hours. Hemoglobin A1C: No results for input(s): HGBA1C in the last 72 hours. Fasting Lipid Panel: No results for input(s): CHOL, HDL, LDLCALC, TRIG, CHOLHDL, LDLDIRECT in the last 72 hours. Thyroid Function Tests: No results for input(s): TSH, T4TOTAL, T3FREE, THYROIDAB in the last 72 hours.  Invalid input(s): FREET3 Anemia Panel: No results for input(s): VITAMINB12, FOLATE, FERRITIN, TIBC, IRON, RETICCTPCT in the last 72 hours.   Radiology: [facility] Chest Port 1 View Result Date: 01/03/2025 CLINICAL DATA:  Questionable sepsis. EXAM: PORTABLE CHEST 1 VIEW COMPARISON:  May 07, 2020 FINDINGS: The cardiac silhouette is mildly enlarged and unchanged in size. An artificial mitral valve is noted. There is mild right-sided volume  loss with stable mild to moderate severity areas of scarring and/or atelectasis is seen within the mid right lung and right lung base. The left lung is clear. No pleural effusion or pneumothorax is identified. Multiple surgical clips are seen overlying the lateral aspect of the upper right hemithorax. The visualized skeletal structures are unremarkable. IMPRESSION: Mild right-sided volume loss with stable mild to moderate severity areas of scarring and/or atelectasis within the mid right lung and right lung base. Electronically Signed   By: Suzen Dials M.D.   On: 01/03/2025 13:25    ECHO ordered  TELEMETRY (personally reviewed): Sinus tachycardia, PVCs rate 100s  EKG (personally reviewed): Sinus tachycardia, PVCs rate 104 bpm  Data reviewed by me 01/04/2025: last 24h vitals tele labs imaging I/O ED provider note, admission H&P  Principal Problem:   Sepsis due to undetermined organism [facility]) Active Problems:   Paroxysmal atrial fibrillation (HCC)   Acute on chronic combined systolic and diastolic CHF (congestive heart failure) (HCC)   GERD without esophagitis   Dementia without behavioral disturbance (HCC)    ASSESSMENT AND PLAN:  Terry [name] is a 74 y.o. male  with a past medical history of insignificant CAD by [facility] 2018, chronic HFmrEF, history of severe MR with bioprosthetic mitral valve placed 2021, history of postoperative atrial fibrillation, history of CVA, hypertension who presented to the ED on 01/03/2025 for decreased appetite and hypotension.  Concern for acute heart failure this a.m. with elevated BNP.  Cardiology was consulted for further evaluation.   # Acute on chronic HFmrEF # Coronary artery disease # Hx MVR 2021 # Hypotension Patient presented for confusion, weakness over the last few days.  Family has noticed increased work of breathing.  BNP was found to be elevated at 15,512.  Lactic acid initially elevated at 2.4 trending to 2.2 > 2.8.  Hypotensive in the ED.  EKG  with sinus tach, PVCs rate 104 bpm. - Echo ordered, further recommendations pending these results. - Continue IV Lasix  40 mg twice daily. - Will consider addition of MRA. - Continue Eliquis  5 mg twice daily  for stroke risk reduction. - Continue aspirin  81 mg daily and Plavix  75 mg daily (on triple therapy as per recent neurology visit). - Continue atorvastatin  80 mg daily.   This patient's plan of care was discussed and created with Dr. Florencio and he is in agreement.  Signed: Danita Bloch, PA-C  01/04/2025, 11:49 AM [facility] Cardiology      "

## 2025-01-04 NOTE — ED Notes (Signed)
 SLP advised to keep pt NPO at this time, and can try to take meds crushed in puree

## 2025-01-04 NOTE — ED Notes (Signed)
 Pt choked on water trying to take medication

## 2025-01-04 NOTE — Evaluation (Signed)
 Clinical/Bedside Swallow Evaluation Patient Details  Name: Terry [name] MRN: [id number] Date of Birth: September 26, 1951  Today's Date: 01/04/2025 Time: SLP Start Time (ACUTE ONLY): 1020 SLP Stop Time (ACUTE ONLY): 1045 SLP Time Calculation (min) (ACUTE ONLY): 25 min  Past Medical History:  Past Medical History:  Diagnosis Date   Diverticulosis    GERD (gastroesophageal reflux disease)    Heart murmur    Hypercholesteremia    Hypertension    Mitral valve prolapse    Stroke [facility])    Past Surgical History:  Past Surgical History:  Procedure Laterality Date   COLONOSCOPY WITH PROPOFOL  N/A 03/06/2020   Procedure: COLONOSCOPY WITH PROPOFOL ;  Surgeon: Toledo, Ladell MARLA, MD;  Location: [facility] ENDOSCOPY;  Service: Gastroenterology;  Laterality: N/A;   MITRAL VALVE REPLACEMENT     RETINAL DETACHMENT SURGERY     RIGHT/LEFT HEART CATH AND CORONARY ANGIOGRAPHY Bilateral 02/18/2017   Procedure: Right/Left Heart Cath and Coronary Angiography;  Surgeon: Marsa Dooms, MD;  Location: [facility] INVASIVE CV LAB;  Service: Cardiovascular;  Laterality: Bilateral;   RIGHT/LEFT HEART CATH AND CORONARY ANGIOGRAPHY N/A 02/22/2020   Procedure: RIGHT/LEFT HEART CATH AND CORONARY ANGIOGRAPHY;  Surgeon: Dooms Marsa, MD;  Location: [facility] INVASIVE CV LAB;  Service: Cardiovascular;  Laterality: N/A;   TEMPOROMANDIBULAR JOINT SURGERY     HPI:  Per H&P, Terry [name] is a 74 y.o. Caucasian male with medical history significant for hypertension, dyslipidemia, Paroxysmal atrial fibrillation/flutter, GERD, diverticulosis and mitral valve prolapse as well as CVA, presented to the emergency room with acute onset of recently diminished appetite and hypotension that was noted today by speech therapist.  The patient has been having nausea and vomiting and over the last few days he has been having cough and dyspnea.  He has been choking on food.  He admitted to constipation.  He has been having tactile fever and chills. CXR:  Mild right-sided volume loss with stable mild to moderate severity  areas of scarring and/or atelectasis within the mid right lung and  right lung base.    Assessment / Plan / Recommendation  Clinical Impression  Pt seen for bedside swallow evaluation in the setting of concern for pt choking on food per family report upon admission. Pt with history for dysphagia intervention, MBSS in 2022 revealing WFL oropharyngeal swallow and last bedside swallow evaluation in Aug 2025 recommended regular solids and thin liquids. However, family/nursing report coughing with thin liquids and choking on food. Per chart review, pt with residual cognitive communication impairment, right sided oral weakness, and dysarthria following CVA. Cognitive communication status further hindered by presence of dementia.   At time of evaluation, pt partially reclined, with repositioning challenged with pt attempting to get out of bed when providing assist for more upright positioning. Therefore, bed left partially reclined and redirection provided to bolster attention to task of intake. Pt on 3L nasal canula with O2 saturations maintained 91 and greater for duration of session. Trials completed of thin liquids, puree, and regular solids. Delayed cough noted with thins and regular solids. Vitals remained stable. Oral phase limited for labial seal and pull on straw and min increased time for mastication.   Given intermittent s/sx of aspiration and report of pt choking prior to admission- recommend completion of MBSS to assess current pharyngeal function. Overall current deconditioning, cognitive communication status, dependency with intake, GERD, and history of CVA- all increase risk for aspiration. Recommend continued NPO with meds crushed in puree.  SLP Visit Diagnosis: Dysphagia, unspecified (R13.10)  Aspiration Risk  Moderate aspiration risk    Diet Recommendation   NPO  Medication Administration: Crushed with puree     Other Recommendations Oral Care Recommendations: Oral care QID;Staff/trained caregiver to provide oral care      Functional Status Assessment Patient has had a recent decline in their functional status and demonstrates the ability to make significant improvements in function in a reasonable and predictable amount of time.    Swallow Study   General Date of Onset: 01/04/25 HPI: Per H&P, Terry [name] is a 74 y.o. Caucasian male with medical history significant for hypertension, dyslipidemia, Paroxysmal atrial fibrillation/flutter, GERD, diverticulosis and mitral valve prolapse as well as CVA, presented to the emergency room with acute onset of recently diminished appetite and hypotension that was noted today by speech therapist.  The patient has been having nausea and vomiting and over the last few days he has been having cough and dyspnea.  He has been choking on food.  He admitted to constipation.  He has been having tactile fever and chills. CXR: Mild right-sided volume loss with stable mild to moderate severity  areas of scarring and/or atelectasis within the mid right lung and  right lung base. Type of Study: Bedside Swallow Evaluation Previous Swallow Assessment: Last bedside swallow eval completed 08/19/24- recommended regular solids and thin liquids. MBSS 03/17/21: oropharyngeal swallowing appearing grossly within functional limits Diet Prior to this Study: NPO Temperature Spikes Noted: No (WBC 8.6) Respiratory Status: Nasal cannula (3L) History of Recent Intubation: No Behavior/Cognition: Alert;Confused;Distractible Oral Cavity Assessment: Within Functional Limits Oral Care Completed by SLP: Recent completion by staff Oral Cavity - Dentition: Adequate natural dentition Vision: Functional for self-feeding Self-Feeding Abilities: Needs assist (given upper extremity weakness) Patient Positioning: Partially reclined (limited success with repositioning) Baseline Vocal Quality:  Normal Volitional Cough: Cognitively unable to elicit Volitional Swallow: Able to elicit    Oral/Motor/Sensory Function Overall Oral Motor/Sensory Function:  (residual/chronic R oral motor impairment)   Ice Chips Ice chips: Not tested   Thin Liquid Thin Liquid: Impaired Presentation: Straw Oral Phase Impairments: Reduced labial seal (limited pull on straw) Oral Phase Functional Implications:  (none) Pharyngeal  Phase Impairments: Cough - Delayed    Nectar Thick Nectar Thick Liquid: Not tested   Honey Thick Honey Thick Liquid: Not tested   Puree Puree: Within functional limits Presentation: Spoon   Solid     Solid: Impaired Presentation: Self Fed Oral Phase Impairments: Impaired mastication Pharyngeal Phase Impairments: Cough - Delayed     Syna Gad Clapp, MS, CCC-SLP Speech Language Pathologist Rehab Services; [facility] [phone] (ascom)   Kaja Jackowski J Clapp 01/04/2025,11:59 AM

## 2025-01-05 ENCOUNTER — Inpatient Hospital Stay

## 2025-01-05 ENCOUNTER — Inpatient Hospital Stay: Admit: 2025-01-05 | Discharge: 2025-01-05 | Disposition: A | Attending: Student

## 2025-01-05 DIAGNOSIS — R0902 Hypoxemia: Secondary | ICD-10-CM | POA: Diagnosis not present

## 2025-01-05 DIAGNOSIS — R7989 Other specified abnormal findings of blood chemistry: Secondary | ICD-10-CM

## 2025-01-05 DIAGNOSIS — R570 Cardiogenic shock: Secondary | ICD-10-CM

## 2025-01-05 DIAGNOSIS — J9601 Acute respiratory failure with hypoxia: Secondary | ICD-10-CM

## 2025-01-05 DIAGNOSIS — I480 Paroxysmal atrial fibrillation: Secondary | ICD-10-CM | POA: Diagnosis not present

## 2025-01-05 DIAGNOSIS — N179 Acute kidney failure, unspecified: Secondary | ICD-10-CM | POA: Diagnosis not present

## 2025-01-05 DIAGNOSIS — E861 Hypovolemia: Secondary | ICD-10-CM

## 2025-01-05 DIAGNOSIS — I5041 Acute combined systolic (congestive) and diastolic (congestive) heart failure: Secondary | ICD-10-CM | POA: Diagnosis not present

## 2025-01-05 DIAGNOSIS — Z7189 Other specified counseling: Secondary | ICD-10-CM | POA: Diagnosis not present

## 2025-01-05 DIAGNOSIS — Z515 Encounter for palliative care: Secondary | ICD-10-CM

## 2025-01-05 DIAGNOSIS — I5043 Acute on chronic combined systolic (congestive) and diastolic (congestive) heart failure: Secondary | ICD-10-CM | POA: Diagnosis not present

## 2025-01-05 DIAGNOSIS — R6521 Severe sepsis with septic shock: Secondary | ICD-10-CM | POA: Diagnosis not present

## 2025-01-05 DIAGNOSIS — A419 Sepsis, unspecified organism: Secondary | ICD-10-CM

## 2025-01-05 DIAGNOSIS — F0390 Unspecified dementia without behavioral disturbance: Secondary | ICD-10-CM | POA: Diagnosis not present

## 2025-01-05 DIAGNOSIS — I214 Non-ST elevation (NSTEMI) myocardial infarction: Secondary | ICD-10-CM | POA: Diagnosis not present

## 2025-01-05 LAB — COMPREHENSIVE METABOLIC PANEL WITH GFR
ALT: 2745 U/L — ABNORMAL HIGH (ref 0–44)
AST: 3200 U/L — ABNORMAL HIGH (ref 15–41)
Albumin: 3.3 g/dL — ABNORMAL LOW (ref 3.5–5.0)
Alkaline Phosphatase: 231 U/L — ABNORMAL HIGH (ref 38–126)
Anion gap: 21 — ABNORMAL HIGH (ref 5–15)
BUN: 40 mg/dL — ABNORMAL HIGH (ref 8–23)
CO2: 17 mmol/L — ABNORMAL LOW (ref 22–32)
Calcium: 8.6 mg/dL — ABNORMAL LOW (ref 8.9–10.3)
Chloride: 103 mmol/L (ref 98–111)
Creatinine, Ser: 1.37 mg/dL — ABNORMAL HIGH (ref 0.61–1.24)
GFR, Estimated: 54 mL/min — ABNORMAL LOW
Glucose, Bld: 91 mg/dL (ref 70–99)
Potassium: 4.2 mmol/L (ref 3.5–5.1)
Sodium: 140 mmol/L (ref 135–145)
Total Bilirubin: 1.9 mg/dL — ABNORMAL HIGH (ref 0.0–1.2)
Total Protein: 5.8 g/dL — ABNORMAL LOW (ref 6.5–8.1)

## 2025-01-05 LAB — CBC WITH DIFFERENTIAL/PLATELET
Abs Immature Granulocytes: 0.07 K/uL (ref 0.00–0.07)
Basophils Absolute: 0 K/uL (ref 0.0–0.1)
Basophils Relative: 0 %
Eosinophils Absolute: 0.1 K/uL (ref 0.0–0.5)
Eosinophils Relative: 1 %
HCT: 32.7 % — ABNORMAL LOW (ref 39.0–52.0)
Hemoglobin: 11 g/dL — ABNORMAL LOW (ref 13.0–17.0)
Immature Granulocytes: 1 %
Lymphocytes Relative: 8 %
Lymphs Abs: 0.8 K/uL (ref 0.7–4.0)
MCH: 33.7 pg (ref 26.0–34.0)
MCHC: 33.6 g/dL (ref 30.0–36.0)
MCV: 100.3 fL — ABNORMAL HIGH (ref 80.0–100.0)
Monocytes Absolute: 0.9 K/uL (ref 0.1–1.0)
Monocytes Relative: 10 %
Neutro Abs: 7.4 K/uL (ref 1.7–7.7)
Neutrophils Relative %: 80 %
Platelets: 98 K/uL — ABNORMAL LOW (ref 150–400)
RBC: 3.26 MIL/uL — ABNORMAL LOW (ref 4.22–5.81)
RDW: 13.9 % (ref 11.5–15.5)
WBC: 9.2 K/uL (ref 4.0–10.5)
nRBC: 0.7 % — ABNORMAL HIGH (ref 0.0–0.2)

## 2025-01-05 LAB — PRO BRAIN NATRIURETIC PEPTIDE: Pro Brain Natriuretic Peptide: 27063 pg/mL — ABNORMAL HIGH

## 2025-01-05 LAB — BLOOD GAS, ARTERIAL
Acid-base deficit: 6.2 mmol/L — ABNORMAL HIGH (ref 0.0–2.0)
Bicarbonate: 15.3 mmol/L — ABNORMAL LOW (ref 20.0–28.0)
O2 Saturation: 100 %
Patient temperature: 37
pCO2 arterial: 21 mmHg — ABNORMAL LOW (ref 32–48)
pH, Arterial: 7.47 — ABNORMAL HIGH (ref 7.35–7.45)
pO2, Arterial: 222 mmHg — ABNORMAL HIGH (ref 83–108)

## 2025-01-05 LAB — ECHOCARDIOGRAM COMPLETE
AR max vel: 1.76 cm2
AV Area VTI: 2.15 cm2
AV Area mean vel: 1.61 cm2
AV Mean grad: 4 mmHg
AV Peak grad: 7.6 mmHg
Ao pk vel: 1.38 m/s
Area-P 1/2: 4.49 cm2
Calc EF: 7.5 %
MV VTI: 2.11 cm2
S' Lateral: 5 cm
Single Plane A2C EF: 10.1 %
Single Plane A4C EF: 4 %
Weight: 2705.49 [oz_av]

## 2025-01-05 LAB — GLUCOSE, CAPILLARY
Glucose-Capillary: 75 mg/dL (ref 70–99)
Glucose-Capillary: 71 mg/dL (ref 70–99)

## 2025-01-05 LAB — MAGNESIUM: Magnesium: 2.2 mg/dL (ref 1.7–2.4)

## 2025-01-05 LAB — D-DIMER, QUANTITATIVE: D-Dimer, Quant: 2.24 ug{FEU}/mL — ABNORMAL HIGH (ref 0.00–0.50)

## 2025-01-05 LAB — TROPONIN T, HIGH SENSITIVITY: Troponin T High Sensitivity: 9211 ng/L (ref 0–19)

## 2025-01-05 MED ORDER — SODIUM BICARBONATE 8.4 % IV SOLN
Freq: Once | INTRAVENOUS | Status: AC
  Filled 2025-01-05: qty 1000
  Filled 2025-01-05: qty 150

## 2025-01-05 MED ORDER — FUROSEMIDE 10 MG/ML IJ SOLN
6.0000 mg/h | INTRAVENOUS | Status: DC
  Administered 2025-01-05: 6 mg/h via INTRAVENOUS
  Filled 2025-01-05: qty 20

## 2025-01-05 MED ORDER — HEPARIN BOLUS VIA INFUSION
4600.0000 [IU] | Freq: Once | INTRAVENOUS | Status: AC
  Administered 2025-01-05: 4600 [IU] via INTRAVENOUS
  Filled 2025-01-05: qty 4600

## 2025-01-05 MED ORDER — GLYCOPYRROLATE 0.2 MG/ML IJ SOLN: 0.2000 mg | INTRAMUSCULAR | Status: DC | PRN

## 2025-01-05 MED ORDER — ACETAMINOPHEN 650 MG RE SUPP: 650.0000 mg | Freq: Four times a day (QID) | RECTAL | Status: DC | PRN

## 2025-01-05 MED ORDER — HALOPERIDOL LACTATE 5 MG/ML IJ SOLN: 0.5000 mg | INTRAMUSCULAR | Status: DC | PRN

## 2025-01-05 MED ORDER — GLYCOPYRROLATE 0.2 MG/ML IJ SOLN
0.2000 mg | INTRAMUSCULAR | Status: DC | PRN
  Administered 2025-01-08 – 2025-01-09 (×3): 0.2 mg via INTRAVENOUS
  Filled 2025-01-05 (×3): qty 1

## 2025-01-05 MED ORDER — HALOPERIDOL 0.5 MG PO TABS: 0.5000 mg | ORAL_TABLET | ORAL | Status: DC | PRN

## 2025-01-05 MED ORDER — ONDANSETRON HCL 4 MG/2ML IJ SOLN: 4.0000 mg | Freq: Four times a day (QID) | INTRAMUSCULAR | Status: DC | PRN

## 2025-01-05 MED ORDER — FUROSEMIDE 10 MG/ML IJ SOLN
20.0000 mg | Freq: Once | INTRAMUSCULAR | Status: DC
  Filled 2025-01-05: qty 2

## 2025-01-05 MED ORDER — ONDANSETRON 4 MG PO TBDP: 4.0000 mg | ORAL_TABLET | Freq: Four times a day (QID) | ORAL | Status: DC | PRN

## 2025-01-05 MED ORDER — POLYVINYL ALCOHOL 1.4 % OP SOLN
1.0000 [drp] | Freq: Four times a day (QID) | OPHTHALMIC | Status: DC | PRN
  Administered 2025-01-09: 1 [drp] via OPHTHALMIC
  Filled 2025-01-05: qty 15

## 2025-01-05 MED ORDER — MORPHINE 100MG IN NS 100ML (1MG/ML) PREMIX INFUSION
2.0000 mg/h | INTRAVENOUS | Status: DC
  Administered 2025-01-05: 1 mg/h via INTRAVENOUS
  Administered 2025-01-06 – 2025-01-08 (×3): 3 mg/h via INTRAVENOUS
  Administered 2025-01-09: 5 mg/h via INTRAVENOUS
  Filled 2025-01-05 (×4): qty 100

## 2025-01-05 MED ORDER — MORPHINE SULFATE (PF) 2 MG/ML IV SOLN
2.0000 mg | INTRAVENOUS | Status: AC
  Filled 2025-01-05: qty 1

## 2025-01-05 MED ORDER — HEPARIN (PORCINE) 25000 UT/250ML-% IV SOLN
1200.0000 [IU]/h | INTRAVENOUS | Status: DC
  Administered 2025-01-05: 1200 [IU]/h via INTRAVENOUS
  Filled 2025-01-05: qty 250

## 2025-01-05 MED ORDER — MORPHINE SULFATE (PF) 2 MG/ML IV SOLN
1.0000 mg | INTRAVENOUS | Status: DC | PRN
  Administered 2025-01-05 (×2): 2 mg via INTRAVENOUS
  Filled 2025-01-05 (×2): qty 1

## 2025-01-05 MED ORDER — HALOPERIDOL LACTATE 2 MG/ML PO CONC: 0.5000 mg | ORAL | Status: DC | PRN

## 2025-01-05 MED ORDER — ACETAMINOPHEN 325 MG PO TABS: 650.0000 mg | ORAL_TABLET | Freq: Four times a day (QID) | ORAL | Status: DC | PRN

## 2025-01-05 MED ORDER — LORAZEPAM 2 MG/ML IJ SOLN
0.5000 mg | Freq: Once | INTRAMUSCULAR | Status: DC
  Filled 2025-01-05: qty 1

## 2025-01-05 MED ORDER — GLYCOPYRROLATE 1 MG PO TABS: 1.0000 mg | ORAL_TABLET | ORAL | Status: DC | PRN

## 2025-01-05 MED ORDER — SODIUM CHLORIDE 0.9 % IV BOLUS
500.0000 mL | Freq: Once | INTRAVENOUS | Status: AC
  Administered 2025-01-05: 500 mL via INTRAVENOUS

## 2025-01-05 MED ORDER — BIOTENE DRY MOUTH MT LIQD: 15.0000 mL | OROMUCOSAL | Status: DC | PRN

## 2025-01-05 NOTE — Progress Notes (Signed)
 Patient began to decline respiratory wise at approximately noon time, reached out to palliative, patient transitioned to comfort care.  Family at bedside and chaplain came per family request.

## 2025-01-05 NOTE — Progress Notes (Signed)
 " [place] [facility] CARDIOLOGY PROGRESS NOTE       Patient ID: Terry [name] MRN: [id number] DOB/AGE: Oct 22, 1951 74 y.o.  Admit date: 01/03/2025 Referring Physician Dr. Murvin Mana Primary Physician Terry Norleen BIRCH, MD  Primary Cardiologist Dr. Ammon Reason for Consultation AoCHF  HPI: [name] is a 74 y.o. male  with a past medical history of insignificant CAD by [facility] 2018, history of severe MR with bioprosthetic mitral valve placed 2021, history of postoperative atrial fibrillation, history of CVA, hypertension who presented to the ED on 01/03/2025 for decreased appetite and hypotension.  Concern for acute heart failure this a.m. with elevated BNP.  Cardiology was consulted for further evaluation.   Interval history: - Patient seen and examined this morning, lying in bed on high flow nasal cannula with family at bedside. - He had rapid response called overnight due to increased work of breathing, he has been refusing BiPAP this morning. - BP remains borderline low, not much urine output documented yesterday despite IV Lasix .  Review of systems complete and found to be negative unless listed above    Past Medical History:  Diagnosis Date   Diverticulosis    GERD (gastroesophageal reflux disease)    Heart murmur    Hypercholesteremia    Hypertension    Mitral valve prolapse    Stroke [facility])     Past Surgical History:  Procedure Laterality Date   COLONOSCOPY WITH PROPOFOL  N/A 03/06/2020   Procedure: COLONOSCOPY WITH PROPOFOL ;  Surgeon: Toledo, Ladell MARLA, MD;  Location: [facility] ENDOSCOPY;  Service: Gastroenterology;  Laterality: N/A;   MITRAL VALVE REPLACEMENT     RETINAL DETACHMENT SURGERY     RIGHT/LEFT HEART CATH AND CORONARY ANGIOGRAPHY Bilateral 02/18/2017   Procedure: Right/Left Heart Cath and Coronary Angiography;  Surgeon: Marsa Ammon, MD;  Location: [facility] INVASIVE CV LAB;  Service: Cardiovascular;  Laterality: Bilateral;   RIGHT/LEFT HEART CATH AND CORONARY  ANGIOGRAPHY N/A 02/22/2020   Procedure: RIGHT/LEFT HEART CATH AND CORONARY ANGIOGRAPHY;  Surgeon: Ammon Marsa, MD;  Location: [facility] INVASIVE CV LAB;  Service: Cardiovascular;  Laterality: N/A;   TEMPOROMANDIBULAR JOINT SURGERY      Medications Prior to Admission  Medication Sig Dispense Refill Last Dose/Taking   apixaban  (ELIQUIS ) 5 MG TABS tablet Take 5 mg by mouth 2 (two) times daily.   01/03/2025 Morning   Apoaequorin (PREVAGEN) 10 MG CAPS Take 1 capsule by mouth daily.   01/02/2025 Evening   aspirin  EC 81 MG EC tablet Take 1 tablet (81 mg total) by mouth daily. 30 tablet 0 01/02/2025 Evening   atorvastatin  (LIPITOR) 80 MG tablet Take 1 tablet (80 mg total) by mouth daily. 30 tablet 11 01/02/2025 Evening   clobetasol  ointment (TEMOVATE ) 0.05 % Apply 1 application topically daily as needed (skin irritation).    Unknown   clopidogrel  (PLAVIX ) 75 MG tablet Take 75 mg by mouth daily.   01/02/2025 Evening   donepezil  (ARICEPT ) 5 MG tablet Take 5 mg by mouth at bedtime.   01/02/2025 Evening   melatonin 5 MG TABS Take 1 tablet (5 mg total) by mouth at bedtime.   01/02/2025 Bedtime   mirtazapine  (REMERON ) 15 MG tablet Take 15 mg by mouth at bedtime.   Unknown   Multiple Vitamin (MULTI-VITAMIN) tablet Take 1 tablet by mouth every morning.   01/03/2025 Morning   Multiple Vitamin (MULTIVITAMIN WITH MINERALS) TABS tablet Take 1 tablet by mouth at bedtime.   01/03/2025 Morning   nortriptyline (PAMELOR) 50 MG capsule Take 50 mg by  mouth at bedtime.   01/02/2025 Evening   omeprazole (PRILOSEC) 20 MG capsule Take 20 mg by mouth every evening.    01/03/2025 Morning   polyethylene glycol (MIRALAX / GLYCOLAX) 17 g packet Take 17 g by mouth 2 (two) times daily.   Unknown   valACYclovir  (VALTREX ) 500 MG tablet Take 500 mg by mouth at bedtime.   01/02/2025 Bedtime   feeding supplement (ENSURE PLUS HIGH PROTEIN) LIQD Take 237 mLs by mouth 2 (two) times daily between meals.      Social History   Socioeconomic History   Marital  status: Married    Spouse name: Not on file   Number of children: Not on file   Years of education: Not on file   Highest education level: Not on file  Occupational History   Not on file  Tobacco Use   Smoking status: Former    Current packs/day: 0.00    Average packs/day: 1.5 packs/day for 20.0 years (30.0 ttl pk-yrs)    Types: Cigarettes    Start date: 78    Quit date: 64    Years since quitting: 41.0   Smokeless tobacco: Never  Vaping Use   Vaping status: Never Used  Substance and Sexual Activity   Alcohol  use: Yes    Comment: ocassionally   Drug use: No   Sexual activity: Not on file  Other Topics Concern   Not on file  Social History Narrative   Not on file   Social Drivers of Health   Tobacco Use: Medium Risk (01/04/2025)   Received from [facility]   Patient History    Smoking Tobacco Use: Former    Smokeless Tobacco Use: Never    Passive Exposure: Never  Physicist, Medical Strain: Low Risk  (07/18/2024)   Received from [facility]   Overall Financial Resource Strain (CARDIA)    Difficulty of Paying Living Expenses: Not hard at all  Food Insecurity: No Food Insecurity (01/04/2025)   Epic    Worried About Radiation Protection Practitioner of Food in the Last Year: Never true    Ran Out of Food in the Last Year: Never true  Transportation Needs: No Transportation Needs (01/04/2025)   Epic    Lack of Transportation (Medical): No    Lack of Transportation (Non-Medical): No  Physical Activity: Not on file  Stress: Not on file  Social Connections: Socially Integrated (01/04/2025)   Social Connection and Isolation Panel    Frequency of Communication with Friends and Family: More than three times a week    Frequency of Social Gatherings with Friends and Family: Three times a week    Attends Religious Services: More than 4 times per year    Active Member of Clubs or Organizations: Yes    Attends Banker Meetings: 1 to 4 times per year     Marital Status: Married  Catering Manager Violence: Not At Risk (01/04/2025)   Epic    Fear of Current or Ex-Partner: No    Emotionally Abused: No    Physically Abused: No    Sexually Abused: No  Depression (PHQ2-9): Low Risk (10/13/2024)   Depression (PHQ2-9)    PHQ-2 Score: 0  Alcohol  Screen: Not on file  Housing: Low Risk (01/04/2025)   Epic    Unable to Pay for Housing in the Last Year: No    Number of Times Moved in the Last Year: 0    Homeless in the Last Year: No  Utilities: Not  At Risk (01/04/2025)   Epic    Threatened with loss of utilities: No  Health Literacy: Not on file    Family History  Problem Relation Age of Onset   Varicose Veins Mother    Cancer Father      Vitals:   01/05/25 0600 01/05/25 0612 01/05/25 0700 01/05/25 0828  BP: 101/79  (!) 86/70   Pulse: (!) 41     Resp: (!) 27 (!) 21 (!) 25   Temp:      TempSrc:      SpO2: 100%   (!) 88%  Weight:        PHYSICAL EXAM General: Chronically ill-appearing elderly male, well nourished, in no acute distress. HEENT: Normocephalic and atraumatic. Neck: No JVD.  Lungs: Normal respiratory effort on HFNC.  Clear bilaterally to auscultation. No wheezes, crackles, rhonchi.  Heart: HRRR. Normal S1 and S2 without gallops or murmurs.  Abdomen: Non-distended appearing.  Msk: Normal strength and tone for age. Extremities: Warm and well perfused. No clubbing, cyanosis.  3+ pitting edema.  Neuro: Alert and oriented X 3. Psych: Answers questions appropriately.   Labs: Basic Metabolic Panel: Recent Labs    01/04/25 0423 01/05/25 0434  NA 134* 140  K 4.1 4.2  CL 100 103  CO2 17* 17*  GLUCOSE 101* 91  BUN 33* 40*  CREATININE 1.18 1.37*  CALCIUM  8.4* 8.6*  MG  --  2.2   Liver Function Tests: Recent Labs    01/03/25 1253 01/05/25 0434  AST 108* 3,200*  ALT 47* 2,745*  ALKPHOS 114 231*  BILITOT 1.4* 1.9*  PROT 6.6 5.8*  ALBUMIN  3.5 3.3*   No results for input(s): LIPASE, AMYLASE in the last 72  hours. CBC: Recent Labs    01/03/25 1253 01/04/25 0423 01/05/25 0434  WBC 8.7 8.6 9.2  NEUTROABS 6.8  --  7.4  HGB 11.6* 10.3* 11.0*  HCT 34.3* 30.7* 32.7*  MCV 100.0 101.0* 100.3*  PLT 139* 120* 98*   Cardiac Enzymes: No results for input(s): CKTOTAL, CKMB, CKMBINDEX, TROPONINIHS in the last 72 hours. BNP: No results for input(s): BNP in the last 72 hours. D-Dimer: Recent Labs    01/04/25 1639 01/05/25 0434  DDIMER 1.40* 2.24*   Hemoglobin A1C: No results for input(s): HGBA1C in the last 72 hours. Fasting Lipid Panel: No results for input(s): CHOL, HDL, LDLCALC, TRIG, CHOLHDL, LDLDIRECT in the last 72 hours. Thyroid Function Tests: No results for input(s): TSH, T4TOTAL, T3FREE, THYROIDAB in the last 72 hours.  Invalid input(s): FREET3 Anemia Panel: No results for input(s): VITAMINB12, FOLATE, FERRITIN, TIBC, IRON, RETICCTPCT in the last 72 hours.   Radiology: [facility] Chest Port 1 View Result Date: 01/05/2025 EXAM: 1 VIEW XRAY OF THE CHEST 01/05/2025 04:36:34 AM COMPARISON: 01/03/2025 CLINICAL HISTORY: 74 year old male with shortness of breath. FINDINGS: Portable AP upright view(s) at 0433 hours LUNGS AND PLEURA: Chronic, since at least 2021, confluent curvilinear scarring in the mid right lung, with asymmetrically mild decreased right lung volume and blunting of the right costophrenic angle. Chronic coarsened markings without pulmonary edema. No pneumothorax. HEART AND MEDIASTINUM: Cardiomegaly. Prosthetic valve noted. BONES AND SOFT TISSUES: Paucity of bowel gas in the visible abdomen. Postsurgical changes overlying right hemithorax. No acute osseous abnormality. IMPRESSION: 1. No new cardiopulmonary abnormality. 2. Chronic right scarring / atelectasis, mildly decreased lung volume, which might be postoperative. Electronically signed by: Helayne Hurst MD MD 01/05/2025 04:56 AM EST RP Workstation: [id number]   CT HEAD WO CONTRAST  ( ) Result Date:  01/04/2025 CLINICAL DATA:  Delirium. EXAM: CT HEAD WITHOUT CONTRAST TECHNIQUE: Contiguous axial images were obtained from the base of the skull through the vertex without intravenous contrast. RADIATION DOSE REDUCTION: This exam was performed according to the departmental dose-optimization program which includes automated exposure control, adjustment of the mA and/or kV according to patient size and/or use of iterative reconstruction technique. COMPARISON:  08/18/2024 FINDINGS: Brain: Ventricles, cisterns and other CSF spaces are within normal. Mild chronic ischemic microvascular disease. Old left basal ganglia lacunar infarct and old left peri insular infarct. Region of low-attenuation over the subcortical white matter of the right frontal region new since the prior exam likely subacute to chronic ischemic change. No mass, mass effect or shift of midline structures. No acute hemorrhage. Vascular: No hyperdense vessel or unexpected calcification. Skull: Normal. Negative for fracture or focal lesion. Sinuses/Orbits: Orbits are normal. Paranasal sinuses demonstrate no air-fluid levels. Other: None. IMPRESSION: 1. No acute findings. 2. Mild chronic ischemic microvascular disease. Old left basal ganglia lacunar infarct and old left peri insular infarct. 3. Region of low-attenuation over the subcortical white matter of the right frontal region new since the prior exam likely subacute to chronic ischemic change. Electronically Signed   By: Toribio Agreste M.D.   On: 01/04/2025 13:48   DG Chest Port 1 View Result Date: 01/03/2025 CLINICAL DATA:  Questionable sepsis. EXAM: PORTABLE CHEST 1 VIEW COMPARISON:  May 07, 2020 FINDINGS: The cardiac silhouette is mildly enlarged and unchanged in size. An artificial mitral valve is noted. There is mild right-sided volume loss with stable mild to moderate severity areas of scarring and/or atelectasis is seen within the mid right lung and right lung base. The left  lung is clear. No pleural effusion or pneumothorax is identified. Multiple surgical clips are seen overlying the lateral aspect of the upper right hemithorax. The visualized skeletal structures are unremarkable. IMPRESSION: Mild right-sided volume loss with stable mild to moderate severity areas of scarring and/or atelectasis within the mid right lung and right lung base. Electronically Signed   By: Suzen Dials M.D.   On: 01/03/2025 13:25    ECHO pending  TELEMETRY (personally reviewed): Sinus tachycardia, PVCs rate 100s  EKG (personally reviewed): Sinus tachycardia, PVCs rate 104 bpm  Data reviewed by me 01/05/2025: last 24h vitals tele labs imaging I/O ED provider note, admission H&P, hospitalist progress note, PCCM note  Principal Problem:   Sepsis due to undetermined organism [facility]) Active Problems:   Paroxysmal atrial fibrillation (HCC)   Acute on chronic combined systolic and diastolic CHF (congestive heart failure) (HCC)   GERD without esophagitis   Dementia without behavioral disturbance (HCC)    ASSESSMENT AND PLAN:  DARRYON BASTIN is a 74 y.o. male  with a past medical history of insignificant CAD by [facility] 2018, chronic HFmrEF, history of severe MR with bioprosthetic mitral valve placed 2021, history of postoperative atrial fibrillation, history of CVA, hypertension who presented to the ED on 01/03/2025 for decreased appetite and hypotension.  Concern for acute heart failure this a.m. with elevated BNP.  Cardiology was consulted for further evaluation.   # Acute on chronic HFmrEF # Coronary artery disease # Hx MVR 2021 # Hypotension Patient presented for confusion, weakness over the last few days.  Family has noticed increased work of breathing.  BNP was found to be elevated at 15,512.  Lactic acid initially elevated at 2.4 trending to 2.2 > 2.8.  Hypotensive in the ED.  EKG with sinus tach, PVCs rate 104 bpm.  Rapid response called overnight for increased work of breathing,  desaturations.  BNP 27,000, troponin 9000.  Appears to have shock liver. - Echo pending, further recommendations pending these results. - Switched over to Lasix  drip. - Continue Eliquis  5 mg twice daily for stroke risk reduction. - Continue aspirin  81 mg daily and Plavix  75 mg daily (was on triple therapy as per recent neurology visit). - Continue atorvastatin  80 mg daily. - Palliative medicine has been consulted for goals of care discussions.  Patient is critically ill with multiorgan failure and multiple comorbidities. Prognosis is guarded.   This patient's plan of care was discussed and created with Dr. Florencio and he is in agreement.  Signed: Danita Bloch, PA-C  01/05/2025, 12:15 PM [facility]      "

## 2025-01-05 NOTE — Consult Note (Signed)
 "  NAME:  TARREN VELARDI, MRN:  [id number], DOB:  Feb 09, 1951, LOS: 2 ADMISSION DATE:  01/03/2025  CHIEF COMPLAINT:  RESP FAILURE    History of Present Illness:  74 y.o. Caucasian male with medical history significant for hypertension, dyslipidemia, Paroxysmal atrial fibrillation/flutter, GERD, diverticulosis and mitral valve prolapse as well as CVA, presented to the emergency room with acute onset of recently diminished appetite and hypotension that was noted today by speech therapist.  Per patient brother, patient also has been short of breath for the past week. Patient was placed on antibiotics for possible aspiration pneumonia and IV Lasix  for exacerbation of congestive heart failure.  Patient transferred to ICU for shock +RENAL FAILURE, +LIVER FAILURE +HEART FAILURE  Significant Hospital Events: Including procedures, antibiotic start and stop dates in addition to other pertinent events   Admitted 1/7 for shock and resp failure, cardiac failure    Antimicrobials:   Antibiotics Given (last 72 hours)     Date/Time Action Medication Dose Rate   01/03/25 1318 New Bag/Given   aztreonam  (AZACTAM ) 2 g in sodium chloride  0.9 % 100 mL IVPB 2 g 200 mL/hr   01/03/25 1353 New Bag/Given   metroNIDAZOLE  (FLAGYL ) IVPB 500 mg 500 mg 100 mL/hr   01/03/25 1506 New Bag/Given   vancomycin  (VANCOCIN ) IVPB 1000 mg/200 mL premix 1,000 mg 200 mL/hr   01/03/25 2350 Given   valACYclovir  (VALTREX ) tablet 500 mg 500 mg    01/04/25 0042 New Bag/Given   aztreonam  (AZACTAM ) 2 g in sodium chloride  0.9 % 100 mL IVPB 2 g 200 mL/hr   01/04/25 0151 New Bag/Given   vancomycin  (VANCOREADY) IVPB 1250 mg/250 mL 1,250 mg 166.7 mL/hr   01/04/25 0956 New Bag/Given   aztreonam  (AZACTAM ) 2 g in sodium chloride  0.9 % 100 mL IVPB 2 g 200 mL/hr   01/04/25 1656 New Bag/Given   aztreonam  (AZACTAM ) 2 g in sodium chloride  0.9 % 100 mL IVPB 2 g 200 mL/hr   01/05/25 0048 New Bag/Given   aztreonam  (AZACTAM ) 2 g in sodium  chloride 0.9 % 100 mL IVPB 2 g 200 mL/hr   01/05/25 0154 New Bag/Given   vancomycin  (VANCOREADY) IVPB 1250 mg/250 mL 1,250 mg 166.7 mL/hr            Interim History / Subjective:  Multiorgan failure Patient refusing biPAP Wants to drink Coffee but I have explained that this is very high risk!       Objective   Blood pressure 94/81, pulse (!) 106, temperature (!) 97.2 F (36.2 C), resp. rate (!) 21, weight 76.7 kg, SpO2 100%.    FiO2 (%):  [40 %-55 %] 40 % PEEP:  [5 cmH20] 5 cmH20 Pressure Support:  [10 cmH20] 10 cmH20   Intake/Output Summary (Last 24 hours) at 01/05/2025 0744 Last data filed at 01/05/2025 0118 Gross per 24 hour  Intake 2643.22 ml  Output 350 ml  Net 2293.22 ml   Filed Weights   01/04/25 0031 01/05/25 0510  Weight: 81.1 kg 76.7 kg    GENERAL:critically ill appearing, +resp distress EYES: Pupils equal, round, reactive to light.  No scleral icterus.  NECK: Supple.  PULMONARY:+rhonchi, +wheezing CARDIOVASCULAR: S1 and S2.   GASTROINTESTINAL: Soft, nontender MUSCULOSKELETAL: edema.  NEUROLOGIC: RT HEMIPLEGIA SKIN:normal, warm to touch, Capillary refill delayed  Pulses present bilaterally   Labs/imaging that I havepersonally reviewed  (right click and Reselect all SmartList Selections daily)     ASSESSMENT AND PLAN SYNOPSIS  74 yo white male with h/o debilitating  CVA admitted for SOB and resp distress with acute systolic heart failure and sepsis from pneumonia shock leading to acute renal failure and liver failure DNR/DNI refusing biPAP    Severe ACUTE Hypoxic failure Refusing biPAP Try High flow [place]   CARDIAC FAILURE-acute combined systolic/diastolic dysfunction -oxygen as needed -Lasix  as tolerated -follow up cardiac enzymes as indicated   CARDIAC ICU monitoring   ACUTE KIDNEY INJURY/Renal Failure -continue Foley Catheter-assess need -Avoid nephrotoxic agents -Follow urine output, BMP -Ensure adequate renal perfusion,  optimize oxygenation -Renal dose medications   Intake/Output Summary (Last 24 hours) at 01/05/2025 0744 Last data filed at 01/05/2025 0118 Gross per 24 hour  Intake 2643.22 ml  Output 350 ml  Net 2293.22 ml    SEPTIC SHOCK/CARDIOGENIC SHOCK -use vasopressors to keep MAP>65 as needed -follow ABG and LA -follow up cultures -emperic ABX  INFECTIOUS DISEASE -continue antibiotics as prescribed -follow up cultures  ENDO - ICU hypoglycemic\Hyperglycemia protocol -check FSBS per protocol   GI GI PROPHYLAXIS as indicated  NUTRITIONAL STATUS DIET-->NPO Constipation protocol as indicated Patient wants coffee but high risk for aspiration, explained to family they can take the risk if he/they want to.   ELECTROLYTES -follow labs as needed -replace as needed -pharmacy consultation and following     Best practice (right click and Reselect all SmartList Selections daily)  Diet:  NPO Mobility:  bed rest  Code Status:  DNR/DNI Disposition: ICU  Labs   CBC: Recent Labs  Lab 01/03/25 1253 01/04/25 0423 01/05/25 0434  WBC 8.7 8.6 9.2  NEUTROABS 6.8  --  7.4  HGB 11.6* 10.3* 11.0*  HCT 34.3* 30.7* 32.7*  MCV 100.0 101.0* 100.3*  PLT 139* 120* 98*    Basic Metabolic Panel: Recent Labs  Lab 01/03/25 1253 01/04/25 0423 01/05/25 0434  NA 135 134* 140  K 3.5 4.1 4.2  CL 100 100 103  CO2 19* 17* 17*  GLUCOSE 110* 101* 91  BUN 37* 33* 40*  CREATININE 1.30* 1.18 1.37*  CALCIUM  9.3 8.4* 8.6*  MG  --   --  2.2   GFR: Estimated Creatinine Clearance: 43.6 mL/min (A) (by C-G formula based on SCr of 1.37 mg/dL (H)). Recent Labs  Lab 01/03/25 1253 01/03/25 1254 01/03/25 1919 01/04/25 0423 01/04/25 1639 01/05/25 0434  PROCALCITON  --   --   --   --  0.15  --   WBC 8.7  --   --  8.6  --  9.2  LATICACIDVEN  --  2.4*  1.9 2.2* 2.8*  --   --     Liver Function Tests: Recent Labs  Lab 01/03/25 1253 01/05/25 0434  AST 108* 3,200*  ALT 47* 2,745*  ALKPHOS 114  231*  BILITOT 1.4* 1.9*  PROT 6.6 5.8*  ALBUMIN  3.5 3.3*   No results for input(s): LIPASE, AMYLASE in the last 168 hours. No results for input(s): AMMONIA in the last 168 hours.  ABG    Component Value Date/Time   PHART 7.47 (H) 01/05/2025 0431   PCO2ART 21 (L) 01/05/2025 0431   PO2ART 222 (H) 01/05/2025 0431   HCO3 15.3 (L) 01/05/2025 0431   ACIDBASEDEF 6.2 (H) 01/05/2025 0431   O2SAT 100 01/05/2025 0431     Coagulation Profile: Recent Labs  Lab 01/03/25 1253 01/04/25 0423  INR 2.8* 3.7*    Cardiac Enzymes: No results for input(s): CKTOTAL, CKMB, CKMBINDEX, TROPONINI in the last 168 hours.  HbA1C: Hgb A1c MFr Bld  Date/Time Value Ref Range Status  08/18/2024  03:21 AM 5.3 4.8 - 5.6 % Final    Comment:    (NOTE) Diagnosis of Diabetes The following HbA1c ranges recommended by the American Diabetes Association (ADA) may be used as an aid in the diagnosis of diabetes mellitus.  Hemoglobin             Suggested A1C NGSP%              Diagnosis  <5.7                   Non Diabetic  5.7-6.4                Pre-Diabetic  >6.4                   Diabetic  <7.0                   Glycemic control for                       adults with diabetes.    04/02/2021 04:32 AM 5.6 4.8 - 5.6 % Final    Comment:    (NOTE) Pre diabetes:          5.7%-6.4%  Diabetes:              >6.4%  Glycemic control for   <7.0% adults with diabetes     CBG: Recent Labs  Lab 01/05/25 0412  GLUCAP 75    Allergies Allergies[1]    Critical Care Time devoted to patient care services described in this note is 85 minutes.  Critical care was necessary to treat or prevent imminent or life-threatening deterioration.   PATIENT WITH VERY POOR PROGNOSIS I ANTICIPATE PROLONGED ICU LOS PALLIATIVE CARE TEAM CONSULTED  Patient with Multiorgan failure and at high risk for cardiac arrest and death.    Nickolas Alm Cellar, M.D.  [facility]   Medical Director [facility] [place]           [1]  Allergies Allergen Reactions   Penicillins Shortness Of Breath    Did it involve swelling of the face/tongue/throat, SOB, or low BP? Yes Did it involve sudden or severe rash/hives, skin peeling, or any reaction on the inside of your mouth or nose? Unknown Did you need to seek medical attention at a hospital or doctor's office? Was at a clinic when reaction occurred When did it last happen? More than 50 years ago If all above answers are NO, may proceed with cephalosporin use.    Cetirizine Dermatitis and Other (See Comments)   Ciprofloxacin Swelling and Rash   Ivp Dye [Iodinated Contrast Media] Rash   Tretinoin Dermatitis and Other (See Comments)   "

## 2025-01-05 NOTE — Progress Notes (Signed)
 Pt oxygen at 74 % on 5 L: [place] and having increased work of breathing. RRR called and team came at bedside. Pt was palced on non-breater. MD Mansy at bedside placing order at 0431. Pt oxygen was running 80 % and BP at 86/95 HR 118. MD Mansy ordered to transfer pt at 0505 to stepdown. Pt was transferred to stepdown at 0510. Primary RN unable to administered 0.5 mg ativan  once d/t BP. Per instruction to administered after the ordered bolus was given. Receiving RN lavanda Nova received the 0.5 mg ativan  IV and will administer after bolus per instruction by MD Mansy.

## 2025-01-05 NOTE — Care Management Important Message (Signed)
 Important Message  Patient Details  Name: Terry [name] MRN: [id number] Date of Birth: 1951-05-25   Important Message Given:  Yes - Medicare IM     [name] 01/05/2025, 3:15 PM

## 2025-01-05 NOTE — Progress Notes (Signed)
 Report given to receiving nurse Donnell.

## 2025-01-05 NOTE — Progress Notes (Addendum)
 Pt is on scheduled eliquis  and has hx of PAF but NPO. NP Donati-Garmon made aware.  Update 0057: No new order place. Will notify incoming shift.

## 2025-01-05 NOTE — Consult Note (Addendum)
 WOC Nurse Consult Note: Reason for Consult: Requested to assess a PI stage 1 to sacrum Performed remote evaluation using wound digital images and pertinent notes in order to determine recommendations.  Wound type: PI Stage 2 to sacrum. Pressure Injury POA: Yes Measurement: 1 cm x 1 cm x 0.1 cm (see nursing flowsheet) Wound bed: 100% pink. Drainage (amount, consistency, odor) None Periwound: intact, no erythema. Dressing procedure/placement/frequency: Cleanse with saline, pat dry. Apply a sacral foam dressing, changing every 3 days if not saturated or soiled. Ok to lift and reassessed the wound very shift.  - Turn and repositioning per hospital policy - If the pt is able to go in the chair, add a chair pressure retribution pad.   WOC team will not plan to follow further. Please reconsult if further assistance is needed. Thank-you,  Lela Holm MSN, RN, CWCN, CNS.  (Phone [phone])

## 2025-01-05 NOTE — Progress Notes (Signed)
 Speech Language Pathology Treatment: Dysphagia  Patient Details Name: NIHAR KLUS MRN: [id number] DOB: 02-22-1951 Today's Date: 01/05/2025 Time: 1015-1100 SLP Time Calculation (min) (ACUTE ONLY): 45 min  Assessment / Plan / Recommendation Clinical Impression  Pt was seen for ongoing assessment of swallowing and potential upgrade to po's this morning post clearance from NSG/MD to see pt in setting of decline in medical/pulmonary status last night/early this morning, which required transfer to CCU Stepdown Unit. Palliative Care has followed this morning for GOC w/ pt/Family, which now indicate no escalation of care per MD/NSG. MBSS has been canceled d/t change in status, but MD requested f/u at bedside.  On HFNC 40L, 50% FiO2; afebrile.   Pt was awake, verbal, and min distracted. He could attend to tasks w/ cue. Pt was eager to have something to drink and wanted coffee. Pt appears to present w/ HIGH risk for aspiration at this time in setting of medical/pulmonary decline and need for increased O2 support. W/ trials of a modified diet (purees, Nectar liquids via TSP), pt exhibited grossly functional oropharyngeal phase swallowing w/ No overt, clinical ss/ of aspiration noted. Pt is at risk for oropharyngeal phase dysphagia and aspiration/aspiration pneumonia in his current presentation. His medical factors can increase risk for aspiration, dysphagia as well as decreased oral intake overall.  During po trials, pt consumed trial consistencies of puree and Nectar liquids via TSP w/ no overt coughing, decline in vocal quality, or change in respiratory presentation during/post trials- O2 sats remained in the lower-mid 80s as at his Baseline. Oral phase appeared grossly [facility] w/ timely bolus management and control of bolus propulsion for A-P transfer for swallowing. Oral clearing achieved w/ all trial consistencies -- moistened foods given. No solids nor thin liquids assessed this session.    Recommend  initiate a trial of Dysphagia level 1 w/ Nectar liquids via TSP; moistened foods and carefully monitor intake w/ pt helping to Hold Cup/spoon when drinking. Recommend aspiration precautions, including small bite/sips slowly. Pills CRUSHED in Puree for safer, easier swallowing. Rest Breaks to aid calm breathing and do not give po's if increased RR/HRR or O2 sats too low. Education given on Pills in Puree; food consistencies and easy to eat options; general aspiration precautions to pt.  ST services to monitor pt's status and provide education; will follow Palliative Care notes for GOC. MBSS TBD. NSG updated, agreed. MD updated. Recommend Dietician f/u for support. Precautions posted in room, chart.      HPI HPI: Per H&P, WLLIAM GROSSO is a 74 y.o. Caucasian male with medical history significant for hypertension, dyslipidemia, Paroxysmal atrial fibrillation/flutter, GERD, diverticulosis and mitral valve prolapse as well as CVA, presented to the emergency room with acute onset of recently diminished appetite and hypotension that was noted today by speech therapist.  The patient has been having nausea and vomiting and over the last few days he has been having cough and dyspnea.  He has been choking on food.  He admitted to constipation.  He has been having tactile fever and chills. CXR: Mild right-sided volume loss with stable mild to moderate severity  areas of scarring and/or atelectasis within the mid right lung and  right lung base.   OF NOTE: pt had a decline in medical/pulmonary(cardiac?) status early in the morning/night and was transferred to CCU stepdown. He is on HFNC w/ increased O2 support.  Palliative Care is meeting w/ pt/Family re: GOC.      SLP Plan  Continue with current plan  of care (TBD)        Swallow Evaluation Recommendations   Recommendations: PO diet PO Diet Recommendation: Dysphagia 1 (Pureed);Mildly thick liquids (Level 2, nectar thick) Liquid Administration via:  Spoon Medication Administration: Crushed with puree Supervision: Full assist for feeding;Full supervision/cueing for swallowing strategies Postural changes: Position pt fully upright for meals;Stay upright 30-60 min after meals;Out of bed for meals Oral care recommendations: Oral care BID (2x/day);Oral care before PO;Staff/trained caregiver to provide oral care Recommended consults: Consider Palliative care;Consider dietitian consultation (following) Caregiver Recommendations: Avoid jello, ice cream, thin soups, popsicles;Remove water pitcher;Have oral suction available     Recommendations   tbd                 (Palliative Care) Oral care BID;Oral care before and after PO;Staff/trained caregiver to provide oral care   Frequent or constant Supervision/Assistance Dysphagia, oropharyngeal phase (R13.12) (impact from declined medical status)     Continue with current plan of care (TBD)        [name], MS, CCC-SLP Speech Language Pathologist Rehab Services; [facility] [phone] (ascom) [place]  01/05/2025, 2:26 PM

## 2025-01-05 NOTE — Progress Notes (Addendum)
 SLP Cancellation Note  Patient Details Name: Terry [name] MRN: [id number] DOB: [birth year]   Cancelled treatment:       Reason Eval/Treat Not Completed: Medical issues which prohibited therapy;Patient not medically ready (chart reviewed; consulted NSG re: pt's status.)  Per chart/report, this morning a Rapid response was called as the patient was having increased work of breathing and has been tachycardic with a heart rate that was up to 157 and dropped his oxygen saturation to the 70s on 5L of O2 by nasal cannula with respiratory rate in the 40s.  And speaking in short sentences.  He also had short runs of V. tach during which she was staring off into space but able to track and respond to questions..  Pt was transferred to the [facility] Unit in CCU for further care. Pt is NPO.  ST services will monitor pt's status for appropriateness for po assessment/trials next 1-2 days. Recommend frequent oral care for hygiene and stimulation of swallowing; aspiration precautions.       [name], MS, CCC-SLP Speech Language Pathologist Rehab Services; [facility] [phone] (ascom) [place] 01/05/2025, 8:37 AM

## 2025-01-05 NOTE — Progress Notes (Signed)
 Critical care note:  Date of note: 01/05/2025  Subjective: Rapid response was called as the patient was having increased work of breathing and has been tachycardic with a heart rate that was up to 157 and dropped his oxygen saturation to the 70s on 5L of O2 by nasal cannula with respiratory rate in the 40s.  And speaking in short sentences.  He also had short runs of V. tach during which she was staring off into space but able to track and respond to questions.  He denied any chest pain or palpitations.  He denied any worsening cough or wheezing.  No nausea or vomiting or abdominal pain.  Objective: Physical examination: Generally: Acutely ill elderly Caucasian male in moderate respiratory distress. Vital signs revealed the BP of 104/79 with heart rate of 115, respiratory rate 30-40 and O2 sat as above.  Pulse oximetry was up to 96% on 100% nonrebreather. Head - atraumatic, normocephalic.  Pupils - equal, round and reactive to light and accommodation. Extraocular movements are intact. No scleral icterus.  Oropharynx - moist mucous membranes and tongue. No pharyngeal erythema or exudate.  Neck - supple. No JVD. Carotid pulses 2+ bilaterally. No carotid bruits. No palpable thyromegaly or lymphadenopathy. Cardiovascular - regular rate and rhythm. Normal S1 and S2. No murmurs, gallops or rubs.  Lungs -slightly diminished bibasilar breath sounds. Abdomen - soft and nontender. Positive bowel sounds. No palpable organomegaly or masses.  Extremities - no pitting edema, clubbing or cyanosis.  Neuro - grossly non-focal. Skin - no rashes. GU and rectal exam - deferred.   Stat EKG showed sinus tachycardia with a rate of 105 with  mild ST segment elevation likely early repolarization and without significant change from earlier EKG on 1/7. Stat portable chest x-ray showed chronic right lung scarring/atelectasis mildly decreased lung volume which might be postoperative and no new cardiopulmonary  abnormality. Stat labs were ordered.  D-dimer came back 1.4 and later 2.2.  Blood glucose was 91.  proBNP came back significantly elevated 27,063 up from 15,005 112.  LFTs showed a BUN of 40 and creatinine 1.37 above previous levels with anion gap of 21 and albumin  3.3 AST 3200 ALT 2745 with total bili of 5.8 total bili 1.9.  High sensitive troponin I came back significantly elevated at 9211.  And CBC showed hemoglobin 11 hematocrit 32.7, better than yesterday with platelets of 98 down from 120.  Initial pH 7.47, pCO2 21, pO2 222, HCO3 15.3 and O2 sat 100% on nonrebreather.  Assessment/plan: 1.  Acute respiratory failure with hypoxia with associated metabolic acidosis: - The patient was initially placed on 100% nonrebreather and given continued respiratory distress and increased work of breathing we placed him on BiPAP after transferring him to stepdown unit bed. - The patient was started on IV bicarbonate drip. - O2 protocol be followed. 2.  Hypotension. - He became hypotensive in the ED but was maintaining good MAP.  He was ordered a bolus of 500 mL IV normal saline. 3.  Non-STEMI. - Given elevated D-dimer we started him on IV heparin  on an empiric basis earlier and when the troponin I came back I discussed the case with his attending physician Dr. Laurita.  He will obtain a cardiology consult and 2D echo.  Will defer further follow-up to him. 4.  Elevated LFTs. - This is a significant elevation concerning for shock liver.  Will defer management to Dr. Laurita.  The patient may need a GI consultation. 5.  AKI. - He will be  hydrated as mentioned above and will follow BMP.  Authorized and performed by: Madison Peaches, MD Total critical care time:   50     minutes. Due to a high probability of clinically significant, life-threatening deterioration, the patient required my highest level of preparedness to intervene emergently and I personally spent this critical care time directly and personally managing the  patient.  This critical care time included obtaining a history, examining the patient, pulse oximetry, ordering and review of studies, arranging urgent treatment with development of management plan, evaluation of patient's response to treatment, frequent reassessment, and discussions with other providers. This critical care time was performed to assess and manage the high probability of imminent, life-threatening deterioration that could result in multiorgan failure.  It was exclusive of separately billable procedures and treating other patients and teaching time.  SABRA

## 2025-01-05 NOTE — Progress Notes (Addendum)
 SPIRITUAL CARE AND COUNSELING CONSULT NOTE    SPIRITUAL ENCOUNTER                                                                                                                                                                      Type of Visit: Initial Care provided to:: Pt and family Conversation partners present during encounter: Other (comment) Training And Development Officer) Referral source: Other (comment) Training And Development Officer) Reason for visit: Routine spiritual support OnCall Visit: Yes   SPIRITUAL FRAMEWORK  Presenting Themes: Significant life change, Values and beliefs, Impactful experiences and emotions, Rituals and practive, Community and relationships Values/beliefs: Patient and family find meaning and strength in their faith. Community/Connection: Family Strengths: Sense of humor. Needs/Challenges/Barriers: New prognosis. Patient Stress Factors: Health changes, Major life changes Family Stress Factors: Health changes, Major life changes   GOALS       INTERVENTIONS   Spiritual Care Interventions Made: Prayer, Compassionate presence, Reflective listening, Explored values/beliefs/practices/strengths, Narrative/life review    INTERVENTION OUTCOMES   Outcomes: Awareness of support, Reduced anxiety, Reduced fear  SPIRITUAL CARE PLAN   Spiritual Care Issues Still Outstanding: Chaplain will continue to follow Follow up plan : Chaplain will see if family is avaible in the mornign for follow-up visit.    If immediate needs arise, please contact [facility] 24 hour on call [phone]   Hart Moats  01/05/2025 3:33 PM

## 2025-01-05 NOTE — Significant Event (Signed)
 Rapid Response Event Note   Reason for Call :  Respiratory distress   Initial Focused Assessment:  Patient with increase work of breathing. HR 115 BP 104/79  O2 96 on NRB. Patient able to speak in short sentences.  Per Primary nurse tele monitor rang out HR 157 and O2 in the 70s  RR 40s. Dr Lawence at bedside.  EKG gotten during rapid and reviewed by DR [facility]. Patient having short runs of Vtact and starring off into space but able to track.  Dr Lawence aware Labs drawn.  Patient to transferred to stepdown.   Patient Having interval episodes of short vtact and starring off into space but able to track and snap out of it onces stimulated. Breathing normal when  patient talking and/or stimulated but increasingly labored when patient is left to idle. Dr Lawence called back to bedside. Morphine  and Ativan    held per Dr lawence Patient placed on bipap. Dr Lawence call to update family on change in condition. Patient to got to CTA once stable.    Interventions:  -Labs -Chest Xray -Bolus of normal saline   Plan of Care:  -CTA -Sodium Bicarb Drip -Heparin  Drip  Event Summary:   MD Notified:  Dr Lawence Call Upfz:9591 Arrival Upfz:9587 End Upfz:9469  Lesley LOISE Shams, RN

## 2025-01-05 NOTE — Progress Notes (Signed)
 Patient transferred to room 121 with all belongings and family meeting patient on arrival in new room.

## 2025-01-05 NOTE — Plan of Care (Signed)
 Continuing with plan of care.

## 2025-01-05 NOTE — TOC Progression Note (Signed)
 Transition of Care [facility]) - Progression Note    Patient Details  Name: Terry [name] MRN: [id number] Date of Birth: [birth year]  Transition of Care [facility]) CM/SW Contact  K'La JINNY Ruts, LCSW Phone Number: 01/05/2025, 4:10 PM  Clinical Narrative:    Chart reviewed. Fax received and the patient is active with [facility].                      Expected Discharge Plan and Services                                               Social Drivers of Health (SDOH) Interventions SDOH Screenings   Food Insecurity: No Food Insecurity (01/04/2025)  Housing: Low Risk (01/04/2025)  Transportation Needs: No Transportation Needs (01/04/2025)  Utilities: Not At Risk (01/04/2025)  Depression (PHQ2-9): Low Risk (10/13/2024)  Financial Resource Strain: Low Risk  (07/18/2024)   Received from [facility]  Social Connections: Socially Integrated (01/04/2025)  Tobacco Use: Medium Risk (01/04/2025)   Received from [facility]    Readmission Risk Interventions     No data to display

## 2025-01-05 NOTE — Consult Note (Signed)
 "                                   Consultation Note Date: 01/05/2025   Patient Name: Terry [name]  DOB: 02-01-1951  MRN: [id number]  Age / Sex: 74 y.o., male  PCP: [name], MD Referring Physician: Laurita Pillion, MD  Reason for Consultation: Establishing goals of care   HPI/Brief Hospital Course: 74 y.o. male  with past medical history of hypertension, hyperlipidemia, CAD, paroxysmal A-fib/flutter, mitral valve prolapse s/p bioprosthetic valve placement 2021, recurrent CVA admitted from home on 01/03/2025 with poor appetite, hypotension found by SLP during a home visit and increased shortness of breath.   Admitted and being treated for possible aspiration PNA with antibiotics and acute CHF with IV lasix  Met sepsis criteria Placed NPO by SLP due to concerns of ongoing aspiration Developed cardiogenic shock Acute change in condition early 1/9 AM, transferred to SD unit, placed on bipap, shock liver noted and worsening renal function--ICU attending consulted  Palliative medicine was consulted for assisting with goals of care conversations.  Subjective:  Chart reviewed: Labs:ABG, renal function worsening, significantly elevated AST/ALT, elevated troponin Vital signs:Noted hypotension and hypoxia Progress Notes:All progress notes from primary team, cardiology, CCM and nursing reviewed since admission  Visited with Terry [name] at his bedside.  He is awake, alert and able to engage in conversation.  He reports feeling well today without acute complaints.  He reports no difficulty in breathing.  He is currently on heated high flow nasal cannula.  Son and wife at bedside during time of visit.  Introduced myself as a publishing rights manager as a member of the palliative care team. Explained palliative medicine is specialized medical care for people living with serious illness. It focuses on providing relief from the symptoms and stress of a serious illness. The goal is to improve quality of life for  both the patient and the family.   Terry [name] and his family able to share their understanding of reason for transfer to ICU.  They discussed his weekend heart leading to shock which is now affecting other major organs including his kidneys and liver.  Family shares primary team as well as ICU attending spoke with them regarding aggressive medical interventions versus comfort care.  We discussed transition to comfort care, he would no longer receive aggressive medical interventions such as continuous vital signs, lab work, radiology testing, or medications not focused on comfort. All care would focus on how the patient is looking and feeling. This would include management of any symptoms that may cause discomfort, pain, shortness of breath, cough, nausea, agitation, anxiety, and/or secretions etc. Symptoms would be managed with medications and other non-pharmacological interventions such as spiritual support if requested, repositioning, music therapy, or therapeutic listening. Family verbalized understanding and appreciation.   At this time, Terry [name] shares he would like to continue current interventions, we discussed not escalating care and in the event of a further decline or decompensation transitioning to CMO at that time. Terry [name] and family verbalize understanding and are in agreement with plan.  Family shares a brief life review. Terry [name] lives at home with his wife as his primary caretaker, spent some time at [facility] after his last admission, receives in home PT/OT/SLP/RN and aid services. Per family, he has been progressing some with therapy, able to pivot and walk a few steps. Primary ambulates with wheelchair. Has tried  to remain as active as he is able, son takes him on trips to [place] as he is able to tolerate. Prior to his strokes, family describes Terry [name] as being very active. Terry [name] is their only child. Family speaks to decline in overall quality of life.  Received a call later  in day that Terry [name] had decompensated, became more hypoxic requiring HHFNC at 100% as well as NRB, he had become confused as well as anxious.  Returned to bedside, Terry [name] requesting to have NRB removed, unaware of current situation, unable to recall our previous visit.  Spoke with son and wife, provided update of respiratory decline. We again spoke about comfort care and decision was made by wife and son to transition to full comfort measures. Shared with family transfer out of ICU likely once weaned from [facility] and comfort met.   Orders placed to reflect CMO.  All questions/concerns addressed. Emotional support provided to patient/family/support persons. PMT will continue to follow and support patient as needed.   Objective: Primary Diagnoses: Present on Admission:  Sepsis due to undetermined organism (HCC)  Paroxysmal atrial fibrillation (HCC)   Physical Exam Constitutional:      General: He is not in acute distress.    Appearance: He is ill-appearing.  HENT:     Mouth/Throat:     Mouth: Mucous membranes are dry.  Pulmonary:     Effort: Pulmonary effort is normal. No respiratory distress.  Skin:    General: Skin is warm and dry.     Findings: Bruising present.  Neurological:     Mental Status: He is alert. He is disoriented.     Motor: Weakness present.     Vital Signs: BP (!) 87/65   Pulse (!) 32   Temp (!) 97.4 F (36.3 C) (Axillary)   Resp (!) 33   Wt 76.7 kg   SpO2 (!) 76%   BMI 26.48 kg/m  Pain Scale: CPOT   Pain Score: 0-No pain  IO: Intake/output summary:  Intake/Output Summary (Last 24 hours) at 01/05/2025 1715 Last data filed at 01/05/2025 1446 Gross per 24 hour  Intake 3714.55 ml  Output 1350 ml  Net 2364.55 ml    LBM: Last BM Date : 01/04/25 Baseline Weight: Weight: 81.1 kg Most recent weight: Weight: 76.7 kg       Assessment and Plan  SUMMARY OF RECOMMENDATIONS   DNR/DNI/Comfort Orders placed to reflect comfort, utilize morphine ,  lorazepam , Robinul , haldol  as needed to maintain comfort  Palliative Prophylaxis:   Bowel Regimen, Delirium Protocol and Frequent Pain Assessment  Spiritual:  Desire for ongoing Chaplain support: Yes Education provided on Chaplain services offered through PMT, education provided on Grief/Bereavement support services  Discussed With: Primary team, CCM team and nursing staff   Thank you for this consult and allowing Palliative Medicine to participate in the care of Yuto Cajuste. Roen. Palliative medicine will continue to follow and assist as needed.   I personally spent a total of 75 minutes in the care of the patient today including preparing to see the patient, getting/reviewing separately obtained history, performing a medically appropriate exam/evaluation, counseling and educating, placing orders, referring and communicating with other health care professionals, documenting clinical information in the EHR, and coordinating care.    Signed by: Waddell Lesches, DNP, AGNP-C Palliative Medicine    Please contact Palliative Medicine Team phone at [phone] for questions and concerns.  For individual provider: See Amion   "

## 2025-01-05 NOTE — Progress Notes (Signed)
 ANTICOAGULATION CONSULT NOTE  Pharmacy Consult for heparin  infusion Indication: pulmonary embolus  Allergies[1]  Patient Measurements: Weight: 76.7 kg (169 lb 1.5 oz) Heparin  Dosing Weight: 76.7 kg  Vital Signs: Temp: 97.2 F (36.2 C) (01/09 0412) Temp Source: Axillary (01/09 0014) BP: 94/81 (01/09 0430) Pulse Rate: 106 (01/09 0430)  Labs: Recent Labs    01/03/25 1253 01/04/25 0423 01/05/25 0434  HGB 11.6* 10.3* 11.0*  HCT 34.3* 30.7* 32.7*  PLT 139* 120* 98*  LABPROT 31.2* 38.6*  --   INR 2.8* 3.7*  --   CREATININE 1.30* 1.18  --     Estimated Creatinine Clearance: 50.6 mL/min (by C-G formula based on SCr of 1.18 mg/dL).   Medical History: Past Medical History:  Diagnosis Date   Diverticulosis    GERD (gastroesophageal reflux disease)    Heart murmur    Hypercholesteremia    Hypertension    Mitral valve prolapse    Stroke [facility])     Medications:  PTA Meds: Apixaban  5 mg BID, last dose 01/03/25 @ 2350 d/t being NPO  Assessment: Pt is a 74 yo male admitted on 01/03/25, now with increased work of breathing and transferred to ICU. Pt with elevated d dimer and unstable for Chest CTA and w dye allergy, being transitioned to heparin  while NPO unable to take Eliquis .  Goal of Therapy:  Heparin  level 0.3-0.7 units/ml aPTT 66-102 seconds Monitor platelets by anticoagulation protocol: Yes   Plan:  Bolus 4600 units x 1 Start heparin  infusion at 1200 units/hr Will follow aPTT until correlation w/ HL confirmed Will check aPTT in 8 hr after start of infusion HL & CBC daily while on heparin   Rankin CANDIE Dills, PharmD, [facility] 01/05/2025 6:13 AM      [1]  Allergies Allergen Reactions   Penicillins Shortness Of Breath    Did it involve swelling of the face/tongue/throat, SOB, or low BP? Yes Did it involve sudden or severe rash/hives, skin peeling, or any reaction on the inside of your mouth or nose? Unknown Did you need to seek medical attention at a hospital or  doctor's office? Was at a clinic when reaction occurred When did it last happen? More than 50 years ago If all above answers are NO, may proceed with cephalosporin use.    Cetirizine Dermatitis and Other (See Comments)   Ciprofloxacin Swelling and Rash   Ivp Dye [Iodinated Contrast Media] Rash   Tretinoin Dermatitis and Other (See Comments)

## 2025-01-05 NOTE — Plan of Care (Signed)
  Problem: Elimination: Goal: Will not experience complications related to bowel motility Outcome: Progressing   Problem: Elimination: Goal: Will not experience complications related to urinary retention Outcome: Progressing   Problem: Pain Managment: Goal: General experience of comfort will improve and/or be controlled Outcome: Progressing   Problem: Safety: Goal: Ability to remain free from injury will improve Outcome: Progressing

## 2025-01-05 NOTE — Progress Notes (Signed)
 " Progress Note   Patient: Terry [name] FMW:[id number] DOB: [birth year] DOA: 01/03/2025     2 DOS: the patient was seen and examined on 01/05/2025   Brief hospital course: Terry [name] is a 74 y.o. Caucasian male with medical history significant for hypertension, dyslipidemia, Paroxysmal atrial fibrillation/flutter, GERD, diverticulosis and mitral valve prolapse as well as CVA, presented to the emergency room with acute onset of recently diminished appetite and hypotension that was noted today by speech therapist.  Per patient brother, patient also has been short of breath for the past week. Patient was placed on antibiotics for possible aspiration pneumonia and IV Lasix  for exacerbation of congestive heart failure.  Patient had evidence of cardiogenic shock with hypotension, lactic acidosis, hypothermia.  Patient was placed on broad-spectrum antibiotics for possible aspiration, also started on IV Lasix . Overnight on 1/8 patient developed severe respite distress, procalcitonin level went up to 25,000, also developed liver function changes.  Transferred to stepdown unit.    Principal Problem:   Sepsis due to undetermined organism [facility]) Active Problems:   Acute on chronic combined systolic and diastolic CHF (congestive heart failure) (HCC)   Paroxysmal atrial fibrillation (HCC)   GERD without esophagitis   Dementia without behavioral disturbance (HCC)   Assessment and Plan: *Severe sepsis due to undetermined organism (HCC)  aspiration pneumonia of both lower lobes Dysphagia. Patient has significant tachycardia, tachypnea, lactic acidosis.  Most likely due to aspiration pneumonia of bilateral lower lobes.  Patient is placed on broad-spectrum antibiotics.  Will continue closely. Patient has been be seen by speech therapy, deemed not safe to start a diet.  Will continue n.p.o. for now.   Acute on chronic combined systolic and diastolic CHF (congestive heart failure) (HCC) cardiogenic shock with  hypotension. Ruled in, POA Shock liver. Acute kidney injury secondary to cardiogenic shock. Moderate to severe mitral stenosis. Moderate aortic stenosis. Likely non-STEMI secondary to cardiogenic shock. - His last 2D echo on 08/18/2024 revealed EF of 45% with grade 1 diastolic dysfunction.  It also showed mild mitral valve regurgitation and moderate severe mitral stenosis with trivial aortic regurgitation and moderate aortic valve stenosis. 1/8. Currently, he has evidence of volume overload, procalcitonin level 15,512, shortness of breath with exertion, mild leg edema. He also had intermittent hypotension, lactic acidosis, hypothermia, this is consistent with cardiogenic shock.  Discussed with cardiology about this, prefer not to take a aggressive approach.  Patient heart failure probably is end-stage, will obtain palliative care consult. In the meantime, patient received 50 g albumin  to stabilize blood pressure and improve perfusion.  In the meantime, patient will be treated with diuretics. Patient condition is critical, very high risk of dying. 1/9.  Patient had worsening short of breath overnight, was placed on BiPAP.  Procalcitonin level increased to 27,000, troponin increased to 9211.  Patient currently is in cardiogenic shock, with shock liver.  Blood pressure still maintaining, but has deteriorating perfusion.  Patient has non-STEMI but this is most likely due to cardiogenic shock.  Had a long discussion with patient and wife, offered option of comfort care versus more aggressive care.  Patient opted to continue on aggressive care.  Consult ICU obtained.  Will placed on Lasix  drip.  Will continue discussion for treatment option if the next 1 or 2 days.  Patient prognosis is dismal.   Paroxysmal atrial fibrillation (HCC) with RVR. Anticoagulation has changed to heparin  drip for elevation of troponin.  Dementia without behavioral disturbance (HCC) - Will continue Aricept    GERD  without  esophagitis - Continue PPI therapy.   History of stroke with right hemiparesis. Severe debility with bedbound status. Poor prognosis, palliative care consult obtained.          Subjective:  Patient developed severe respite distress overnight, was placed on BiPAP.  Physical Exam: Vitals:   01/05/25 0430 01/05/25 0510 01/05/25 0600 01/05/25 0612  BP: 94/81     Pulse: (!) 106     Resp:    (!) 21  Temp:      TempSrc:      SpO2: 100%  100%   Weight:  76.7 kg     General exam: Ill-appearing, more comfortable on BiPAP. Respiratory system: Decreased breathing sounds with crackles in the base. Respiratory effort normal. Cardiovascular system: Irregularly irregular and tachycardic. No JVD, murmurs, rubs, gallops or clicks.  Gastrointestinal system: Abdomen is nondistended, soft and nontender. No organomegaly or masses felt. Normal bowel sounds heard. Central nervous system: Alert and oriented x2.  Right hemiplegia Extremities: 1+ leg edema Skin: No rashes, lesions or ulcers Psychiatry: Flat affect   Data Reviewed:  Reviewed lab results.  Family Communication: Wife and son updated bedside  Disposition: Status is: Inpatient Remains inpatient appropriate because: Severity of disease IV treatment,  CRITICAL CARE Performed by: Murvin Mana   Total critical care time: 50 minutes  Critical care time was exclusive of separately billable procedures and treating other patients.  Critical care was necessary to treat or prevent imminent or life-threatening deterioration.  Critical care was time spent personally by me on the following activities: development of treatment plan with patient and/or surrogate as well as nursing, discussions with consultants, evaluation of patient's response to treatment, examination of patient, obtaining history from patient or surrogate, ordering and performing treatments and interventions, ordering and review of laboratory studies, ordering and review  of radiographic studies, pulse oximetry and re-evaluation of patient's condition.        Author: Murvin Mana, MD 01/05/2025 7:48 AM  For on call review [email or web address].    "

## 2025-01-06 DIAGNOSIS — A419 Sepsis, unspecified organism: Secondary | ICD-10-CM | POA: Diagnosis not present

## 2025-01-06 DIAGNOSIS — I5043 Acute on chronic combined systolic (congestive) and diastolic (congestive) heart failure: Secondary | ICD-10-CM | POA: Diagnosis not present

## 2025-01-06 DIAGNOSIS — Z515 Encounter for palliative care: Secondary | ICD-10-CM | POA: Diagnosis not present

## 2025-01-06 DIAGNOSIS — R0902 Hypoxemia: Secondary | ICD-10-CM | POA: Diagnosis not present

## 2025-01-06 DIAGNOSIS — F0390 Unspecified dementia without behavioral disturbance: Secondary | ICD-10-CM | POA: Diagnosis not present

## 2025-01-06 MED ORDER — MORPHINE BOLUS VIA INFUSION
1.0000 mg | INTRAVENOUS | Status: DC | PRN
  Administered 2025-01-06 – 2025-01-09 (×14): 1 mg via INTRAVENOUS

## 2025-01-06 NOTE — Progress Notes (Signed)
 SPIRITUAL CARE AND COUNSELING CONSULT NOTE   VISIT SUMMARY Chaplain followed up with family as they sit vigil with patient from yesterday's ICU visit.  Chaplain offered a compassionate presence.  Chaplain will refer this patient to on-call Chaplain. SPIRITUAL ENCOUNTER                                                                                                                                                                      Type of Visit: Follow up Care provided to:: Pt and family Conversation partners present during encounter: Nurse Referral source: Other (comment) Training And Development Officer) Reason for visit: End-of-life OnCall Visit: Yes   SPIRITUAL FRAMEWORK  Presenting Themes: Significant life change, Values and beliefs, Impactful experiences and emotions, Rituals and practive, Community and relationships Values/beliefs: Patient and family find meaning and strength in their faith. Community/Connection: Family Strengths: Sense of humor. Needs/Challenges/Barriers: New prognosis. Patient Stress Factors: Health changes, Major life changes Family Stress Factors: Health changes, Major life changes   GOALS       INTERVENTIONS   Spiritual Care Interventions Made: Prayer, Compassionate presence, Reflective listening, Explored values/beliefs/practices/strengths, Narrative/life review    INTERVENTION OUTCOMES   Outcomes: Awareness of support, Reduced anxiety, Reduced fear  SPIRITUAL CARE PLAN   Spiritual Care Issues Still Outstanding: Chaplain will continue to follow Follow up plan : Chaplain will see if family is avaible in the mornign for follow-up visit.    If immediate needs arise, please contact [facility] 24 hour on call [phone]   Rana Davis  01/06/2025 8:07 AM

## 2025-01-06 NOTE — Plan of Care (Signed)
  Problem: Pain Managment: Goal: General experience of comfort will improve and/or be controlled Outcome: Progressing   Problem: Safety: Goal: Ability to remain free from injury will improve Outcome: Progressing

## 2025-01-06 NOTE — Progress Notes (Signed)
 "                                                                                                                                                                                                          Daily Progress Note   Patient Name: Terry [name]      Date: 01/06/2025 DOB: 02-Feb-1951  Age: 74 y.o. MRN#: [id number] Attending Physician: Terry Pillion, MD Primary Care Physician: Terry Norleen BIRCH, MD Admit Date: 01/03/2025  Reason for Consultation/Follow-up: Establishing goals of care  HPI/Brief Hospital Review: 74 y.o. male  with past medical history of hypertension, hyperlipidemia, CAD, paroxysmal A-fib/flutter, mitral valve prolapse s/p bioprosthetic valve placement 2021, recurrent CVA admitted from home on 01/03/2025 with poor appetite, hypotension found by SLP during a home visit and increased shortness of breath.    Admitted and being treated for possible aspiration PNA with antibiotics and acute CHF with IV lasix  Met sepsis criteria Placed NPO by SLP due to concerns of ongoing aspiration Developed cardiogenic shock Acute change in condition early 1/9 AM, transferred to SD unit, placed on bipap, shock liver noted and worsening renal function--ICU attending consulted  Transitioned to [facility] 1/9   Palliative medicine was consulted for assisting with goals of care conversations.  Following up today for symptom management needs and ongoing coordination of care.  Subjective: Chart reviewed: Progress Notes:Primary provider notes and nursing notes reviewed from previous day MAR-transitioned to continuous morphine  infusion overnight, orders to be adjusted to allow for titration and bolus from bag  Visited with Terry [name] at his bedside. He is resting comfortably in bed, does not acknowledge my presence in room. Multiple family members at bedside.  Family shares since initiation of morphine  infusion, Terry [name] has been resting comfortably. Family shares primary team offered transfer to hospice  IPU but they are not interested at this time. Family feels as though comfort is being met here and would be concerned about transfer to another facility.  Adjustments made to medications to allow for titration as well as bolus from bag.  Answered and addressed all questions and concerns. Chaplain services continue to follow for emotional support. PMT to continue to follow for ongoing symptom management. Anticipate hospital passing.  Objective:  Physical Exam Constitutional:      General: He is not in acute distress.    Appearance: He is ill-appearing.     Comments: Sleeping  Pulmonary:     Effort: Pulmonary effort is normal. No respiratory distress.  Skin:    General: Skin is warm and dry.  Findings: Bruising present.             Vital Signs: BP (!) 80/49 (BP Location: Left Arm)   Pulse 97   Temp 97.9 F (36.6 C)   Resp 12   Wt 76.7 kg   SpO2 96%   BMI 26.48 kg/m  SpO2: SpO2: 96 % O2 Device: O2 Device: Nasal Cannula O2 Flow Rate: O2 Flow Rate (L/min): 3 L/min   Palliative Care Assessment & Plan   Assessment/Recommendation/Plan  CMO remains Adjustments made to [facility] to allow for titration and bolus from bag  Care plan was discussed with nursing staff.  Thank you for allowing the Palliative Medicine Team to assist in the care of this patient.  I personally spent a total of 35 minutes in the care of the patient today including preparing to see the patient, getting/reviewing separately obtained history, performing a medically appropriate exam/evaluation, counseling and educating, placing orders, and documenting clinical information in the EHR.   Terry Lesches, DNP, AGNP-C Palliative Medicine   Please contact Palliative Medicine Team phone at [phone] for questions and concerns.   "

## 2025-01-06 NOTE — Progress Notes (Signed)
 " Progress Note   Patient: Terry [name] FMW:[id number] DOB: 1951/06/10 DOA: 01/03/2025     3 DOS: the patient was seen and examined on 01/06/2025   Brief hospital course: SIGMUND MORERA is a 74 y.o. Caucasian male with medical history significant for hypertension, dyslipidemia, Paroxysmal atrial fibrillation/flutter, GERD, diverticulosis and mitral valve prolapse as well as CVA, presented to the emergency room with acute onset of recently diminished appetite and hypotension that was noted today by speech therapist.  Per patient brother, patient also has been short of breath for the past week. Patient was placed on antibiotics for possible aspiration pneumonia and IV Lasix  for exacerbation of congestive heart failure.  Patient had evidence of cardiogenic shock with hypotension, lactic acidosis, hypothermia.  Patient was placed on broad-spectrum antibiotics for possible aspiration, also started on IV Lasix . Overnight on 1/8 patient developed severe respite distress, procalcitonin level went up to 25,000, also developed liver function changes, consistent with shock liver. After long discussion with the family, decision was made to transition to comfort care.    Principal Problem:   Sepsis due to undetermined organism [facility]) Active Problems:   Acute on chronic combined systolic and diastolic CHF (congestive heart failure) (HCC)   Paroxysmal atrial fibrillation (HCC)   GERD without esophagitis   Dementia without behavioral disturbance (HCC)   Assessment and Plan: Severe sepsis due to undetermined organism (HCC)  aspiration pneumonia of both lower lobes Dysphagia. Patient has significant tachycardia, tachypnea, lactic acidosis.  Most likely due to aspiration pneumonia of bilateral lower lobes.  Patient is placed on broad-spectrum antibiotics.   But the patient was transition to comfort care on 1/9.   Acute on chronic combined systolic and diastolic CHF (congestive heart failure) (HCC) cardiogenic  shock with hypotension. Ruled in, POA Shock liver. Acute kidney injury secondary to cardiogenic shock. Moderate to severe mitral stenosis. Moderate aortic stenosis. Non-STEMI secondary to cardiogenic shock. Repeat echocardiogram showed ejection fraction dropped down to 20 to 25%.  Patient is getting cardiogenic shock.  Transition to comfort care on 1/9, currently on morphine  drip.   Paroxysmal atrial fibrillation (HCC) with RVR. No additional treatment..   Dementia without behavioral disturbance (HCC) No additional treatment.   GERD without esophagitis    History of stroke with right hemiparesis. Severe debility with bedbound status.    Discussed with the family, prefer to die in the hospital.     Subjective:  Patient is unresponsive, comfortable on morphine  drip.  Physical Exam: Vitals:   01/05/25 1500 01/05/25 1600 01/06/25 0829 01/06/25 1234  BP:   (!) 74/49 (!) 80/49  Pulse:  (!) 32 66 97  Resp: (!) 22 (!) 33 12   Temp:   99.4 F (37.4 C) 97.9 F (36.6 C)  TempSrc:      SpO2:  (!) 76% 91% 96%  Weight:       General exam: Unresponsive Respiratory system: Clear to auscultation. Respiratory effort normal. Cardiovascular system: Irregular and tachycardic. No JVD, murmurs, rubs, gallops or clicks. No pedal edema. Gastrointestinal system: Abdomen is nondistended, soft and nontender. No organomegaly or masses felt. Normal bowel sounds heard. Central nervous system: Unresponsive Extremities: Symmetric 5 x 5 power. Skin: No rashes, lesions or ulcers    Data Reviewed:  There are no new results to review at this time.  Family Communication: Family is updated at bedside.  Disposition: Status is: Inpatient Remains inpatient appropriate because: Comfort care.     Time spent: 35 minutes  Author: Murvin Mana, MD 01/06/2025  1:57 PM  For on call review [email or web address].    "

## 2025-01-06 NOTE — Progress Notes (Signed)
 [facility]    SUBJECTIVE: Not responsive lying in bed hypotensive family at bedside they have come to a decision for palliative care at this point and prefer no further aggressive intervention and comfort measures only   Vitals:   01/05/25 1500 01/05/25 1600 01/06/25 0829 01/06/25 1234  BP:   (!) 74/49 (!) 80/49  Pulse:  (!) 32 66 97  Resp: (!) 22 (!) 33 12   Temp:   99.4 F (37.4 C) 97.9 F (36.6 C)  TempSrc:      SpO2:  (!) 76% 91% 96%  Weight:         Intake/Output Summary (Last 24 hours) at 01/06/2025 1435 Last data filed at 01/06/2025 0443 Gross per 24 hour  Intake 16.12 ml  Output 600 ml  Net -583.88 ml      PHYSICAL EXAM  General: Well developed, well nourished, in no acute distress HEENT:  Normocephalic and atramatic Neck:  No JVD.  Lungs: Clear bilaterally to auscultation and percussion. Heart: HRRR . Normal S1 and S2 without gallops or murmurs.  Abdomen: Bowel sounds are positive, abdomen soft and non-tender  Msk:  Back normal, normal gait. Normal strength and tone for age. Extremities: No clubbing, cyanosis or edema.   Neuro: Alert and oriented X 3. Psych:  Good affect, responds appropriately   LABS: Basic Metabolic Panel: Recent Labs    01/04/25 0423 01/05/25 0434  NA 134* 140  K 4.1 4.2  CL 100 103  CO2 17* 17*  GLUCOSE 101* 91  BUN 33* 40*  CREATININE 1.18 1.37*  CALCIUM  8.4* 8.6*  MG  --  2.2   Liver Function Tests: Recent Labs    01/05/25 0434  AST 3,200*  ALT 2,745*  ALKPHOS 231*  BILITOT 1.9*  PROT 5.8*  ALBUMIN  3.3*   No results for input(s): LIPASE, AMYLASE in the last 72 hours. CBC: Recent Labs    01/04/25 0423 01/05/25 0434  WBC 8.6 9.2  NEUTROABS  --  7.4  HGB 10.3* 11.0*  HCT 30.7* 32.7*  MCV 101.0* 100.3*  PLT 120* 98*   Cardiac Enzymes: No results for input(s): CKTOTAL, CKMB, CKMBINDEX, TROPONINI in the last 72 hours. BNP: Invalid input(s): POCBNP D-Dimer: Recent Labs    01/04/25 1639  01/05/25 0434  DDIMER 1.40* 2.24*   Hemoglobin A1C: No results for input(s): HGBA1C in the last 72 hours. Fasting Lipid Panel: No results for input(s): CHOL, HDL, LDLCALC, TRIG, CHOLHDL, LDLDIRECT in the last 72 hours. Thyroid Function Tests: No results for input(s): TSH, T4TOTAL, T3FREE, THYROIDAB in the last 72 hours.  Invalid input(s): FREET3 Anemia Panel: No results for input(s): VITAMINB12, FOLATE, FERRITIN, TIBC, IRON, RETICCTPCT in the last 72 hours.  ECHOCARDIOGRAM COMPLETE Result Date: 01/05/2025    ECHOCARDIOGRAM REPORT   Patient Name:   Terry [name] Date of Exam: 01/05/2025 Medical Rec #:  [id number]    Height:       67.0 in Accession #:    [id number]   Weight:       169.1 lb Date of Birth:  1951-04-04   BSA:          1.883 m Patient Age:    73 years     BP:           97/84 mmHg Patient Gender: M            HR:           83 bpm. Exam Location:  [facility] Procedure: 2D Echo, Color Doppler  and Cardiac Doppler (Both Spectral and Color            Flow Doppler were utilized during procedure). Indications:     CHF-Acute Systolic I50.21  History:         Patient has prior history of Echocardiogram examinations, most                  recent 08/18/2024. CHF; Mitral Valve Prolapse.  Sonographer:     Ashley McNeely-Sloane Referring Phys:  [id number] CARALYN HUDSON Diagnosing Phys: Mare Ludtke D Cynthia Cogle MD IMPRESSIONS  1. Left ventricular ejection fraction, by estimation, is 20 to 25%. The left ventricle has severely decreased function. The left ventricle demonstrates global hypokinesis. The left ventricular internal cavity size was severely dilated. There is mild concentric left ventricular hypertrophy. Left ventricular diastolic parameters are consistent with Grade I diastolic dysfunction (impaired relaxation).  2. Right ventricular systolic function is moderately reduced. The right ventricular size is mildly enlarged.  3. Left atrial size was mildly dilated.  4. Right atrial  size was mildly dilated.  5. The mitral valve has been repaired/replaced. Mild mitral valve regurgitation.  6. Tricuspid valve regurgitation is moderate.  7. The aortic valve is grossly normal. Aortic valve regurgitation is mild. Aortic valve sclerosis is present, with no evidence of aortic valve stenosis.  8. Aortic dilatation noted. There is mild dilatation of the aortic root, measuring 38 mm. FINDINGS  Left Ventricle: Left ventricular ejection fraction, by estimation, is 20 to 25%. The left ventricle has severely decreased function. The left ventricle demonstrates global hypokinesis. Strain was performed and the global longitudinal strain is indeterminate. The left ventricular internal cavity size was severely dilated. There is mild concentric left ventricular hypertrophy. Left ventricular diastolic parameters are consistent with Grade I diastolic dysfunction (impaired relaxation). Right Ventricle: The right ventricular size is mildly enlarged. No increase in right ventricular wall thickness. Right ventricular systolic function is moderately reduced. Left Atrium: Left atrial size was mildly dilated. Right Atrium: Right atrial size was mildly dilated. Pericardium: There is no evidence of pericardial effusion. Mitral Valve: The mitral valve has been repaired/replaced. Mild mitral valve regurgitation. There is a bioprosthetic valve present in the mitral position. MV peak gradient, 5.5 mmHg. The mean mitral valve gradient is 4.0 mmHg. Tricuspid Valve: The tricuspid valve is grossly normal. Tricuspid valve regurgitation is moderate. Aortic Valve: The aortic valve is grossly normal. Aortic valve regurgitation is mild. Aortic valve sclerosis is present, with no evidence of aortic valve stenosis. Aortic valve mean gradient measures 4.0 mmHg. Aortic valve peak gradient measures 7.6 mmHg. Aortic valve area, by VTI measures 2.15 cm. Pulmonic Valve: The pulmonic valve was not well visualized. Pulmonic valve regurgitation is  trivial. Aorta: The aortic root was not well visualized and aortic dilatation noted. There is mild dilatation of the aortic root, measuring 38 mm. IAS/Shunts: No atrial level shunt detected by color flow Doppler. Additional Comments: 3D was performed not requiring image post processing on an independent workstation and was indeterminate.  LEFT VENTRICLE PLAX 2D LVIDd:         6.00 cm      Diastology LVIDs:         5.00 cm      LV e' medial:    2.94 cm/s LV PW:         1.10 cm      LV E/e' medial:  30.2 LV IVS:        1.20 cm      LV e'  lateral:   6.85 cm/s LVOT diam:     2.40 cm      LV E/e' lateral: 13.0 LV SV:         37 LV SV Index:   20 LVOT Area:     4.52 cm  LV Volumes (MOD) LV vol d, MOD A2C: 218.0 ml LV vol d, MOD A4C: 150.0 ml LV vol s, MOD A2C: 196.0 ml LV vol s, MOD A4C: 144.0 ml LV SV MOD A2C:     22.0 ml LV SV MOD A4C:     150.0 ml LV SV MOD BP:      14.9 ml RIGHT VENTRICLE          IVC RV Basal diam:  5.70 cm  IVC diam: 2.10 cm RV Mid diam:    3.00 cm TAPSE (M-mode): 1.1 cm LEFT ATRIUM             Index        RIGHT ATRIUM           Index LA diam:        3.30 cm 1.75 cm/m   RA Area:     18.30 cm LA Vol (A2C):   51.2 ml 27.19 ml/m  RA Volume:   48.00 ml  25.49 ml/m LA Vol (A4C):   86.5 ml 45.94 ml/m LA Biplane Vol: 71.7 ml 38.08 ml/m  AORTIC VALVE                    PULMONIC VALVE AV Area (Vmax):    1.76 cm     PV Vmax:          0.81 m/s AV Area (Vmean):   1.61 cm     PV Vmean:         57.300 cm/s AV Area (VTI):     2.15 cm     PV VTI:           0.124 m AV Vmax:           138.00 cm/s  PV Peak grad:     2.7 mmHg AV Vmean:          91.750 cm/s  PV Mean grad:     1.0 mmHg AV VTI:            0.173 m      PR End Diast Vel: 7.18 msec AV Peak Grad:      7.6 mmHg     RVOT Peak grad:   1 mmHg AV Mean Grad:      4.0 mmHg LVOT Vmax:         53.80 cm/s LVOT Vmean:        32.700 cm/s LVOT VTI:          0.082 m LVOT/AV VTI ratio: 0.48  AORTA Ao Root diam: 3.80 cm Ao Asc diam:  3.00 cm MITRAL VALVE                 TRICUSPID VALVE MV Area (PHT): 4.49 cm     TR Peak grad:   26.6 mmHg MV Area VTI:   2.11 cm     TR Mean grad:   18.0 mmHg MV Peak grad:  5.5 mmHg     TR Vmax:        258.00 cm/s MV Mean grad:  4.0 mmHg     TR Vmean:       201.0 cm/s MV Vmax:       1.18 m/s MV  Vmean:      93.0 cm/s    SHUNTS MV Decel Time: 169 msec     Systemic VTI:  0.08 m MV E velocity: 88.90 cm/s   Systemic Diam: 2.40 cm MV A velocity: 102.00 cm/s  Pulmonic VTI:  0.096 m MV E/A ratio:  0.87 Annasophia Crocker JONETTA Lovelace MD Electronically signed by Cara JONETTA Lovelace MD Signature Date/Time: 01/05/2025/3:16:37 PM    Final    DG Chest Port 1 View Result Date: 01/05/2025 EXAM: 1 VIEW XRAY OF THE CHEST 01/05/2025 04:36:34 AM COMPARISON: 01/03/2025 CLINICAL HISTORY: 74 year old male with shortness of breath. FINDINGS: Portable AP upright view(s) at 0433 hours LUNGS AND PLEURA: Chronic, since at least 2021, confluent curvilinear scarring in the mid right lung, with asymmetrically mild decreased right lung volume and blunting of the right costophrenic angle. Chronic coarsened markings without pulmonary edema. No pneumothorax. HEART AND MEDIASTINUM: Cardiomegaly. Prosthetic valve noted. BONES AND SOFT TISSUES: Paucity of bowel gas in the visible abdomen. Postsurgical changes overlying right hemithorax. No acute osseous abnormality. IMPRESSION: 1. No new cardiopulmonary abnormality. 2. Chronic right scarring / atelectasis, mildly decreased lung volume, which might be postoperative. Electronically signed by: Helayne Hurst MD MD 01/05/2025 04:56 AM EST RP Workstation: [id number]     Echo severely depressed left ventricular function prosthetic mitral valve in place appears to be functioning adequately EF around 20 to 25%  TELEMETRY: Sinus tachycardia rate around 95 nonspecific ST-T changes:  ASSESSMENT AND PLAN:  Principal Problem:   Sepsis due to undetermined organism [facility]) Active Problems:   Paroxysmal atrial fibrillation (HCC)   Acute on chronic  combined systolic and diastolic CHF (congestive heart failure) (HCC)   GERD without esophagitis   Dementia without behavioral disturbance (HCC)    Plan Acute on chronic midrange heart failure continue current therapy as tolerated Known coronary disease no recent anginal symptoms continue current medical management Elevated BNP consistent with advanced heart failure BNP of 15,000 Persistently elevated lactic acid consistent with metabolic acidosis Mitral valve repair replacement continue current therapy High intensity statin therapy with Lipitor be continued Continue aspirin  Plavix  and Eliquis  as per neurology because of stroke risk Prognosis appears to be poor with strongly consider palliative care   Cara JONETTA Lovelace, MD 01/06/2025 2:35 PM

## 2025-01-07 DIAGNOSIS — Z515 Encounter for palliative care: Secondary | ICD-10-CM | POA: Diagnosis not present

## 2025-01-07 DIAGNOSIS — I480 Paroxysmal atrial fibrillation: Secondary | ICD-10-CM | POA: Diagnosis not present

## 2025-01-07 DIAGNOSIS — I5043 Acute on chronic combined systolic (congestive) and diastolic (congestive) heart failure: Secondary | ICD-10-CM | POA: Diagnosis not present

## 2025-01-07 DIAGNOSIS — A419 Sepsis, unspecified organism: Secondary | ICD-10-CM | POA: Diagnosis not present

## 2025-01-07 DIAGNOSIS — R0902 Hypoxemia: Secondary | ICD-10-CM | POA: Diagnosis not present

## 2025-01-07 DIAGNOSIS — L8990 Pressure ulcer of unspecified site, unspecified stage: Secondary | ICD-10-CM | POA: Insufficient documentation

## 2025-01-07 DIAGNOSIS — F0390 Unspecified dementia without behavioral disturbance: Secondary | ICD-10-CM | POA: Diagnosis not present

## 2025-01-07 DIAGNOSIS — R570 Cardiogenic shock: Secondary | ICD-10-CM | POA: Insufficient documentation

## 2025-01-07 NOTE — Plan of Care (Signed)
  Problem: Respiratory: Goal: Ability to maintain adequate ventilation will improve Outcome: Progressing   Problem: Pain Managment: Goal: General experience of comfort will improve and/or be controlled Outcome: Progressing

## 2025-01-07 NOTE — Progress Notes (Signed)
 "                                                                                                                                                                                                          Daily Progress Note   Patient Name: Terry [name]       Date: 01/07/2025 DOB: May 01, 1951  Age: 74 y.o. MRN#: [id number] Attending Physician: Laurita Pillion, MD Primary Care Physician: [name], MD Admit Date: 01/03/2025  Reason for Consultation/Follow-up: Establishing goals of care  HPI/Brief Hospital Review: 74 y.o. male  with past medical history of hypertension, hyperlipidemia, CAD, paroxysmal A-fib/flutter, mitral valve prolapse s/p bioprosthetic valve placement 2021, recurrent CVA admitted from home on 01/03/2025 with poor appetite, hypotension found by SLP during a home visit and increased shortness of breath.    Admitted and being treated for possible aspiration PNA with antibiotics and acute CHF with IV lasix  Met sepsis criteria Placed NPO by SLP due to concerns of ongoing aspiration Developed cardiogenic shock Acute change in condition early 1/9 AM, transferred to SD unit, placed on bipap, shock liver noted and worsening renal function--ICU attending consulted   Transitioned to [facility] 1/9   Palliative medicine was consulted for assisting with goals of care conversations.   Following up today for symptom management needs and ongoing coordination of care.   Subjective: Chart reviewed: Progress Notes:Primary team and nursing notes from previous day  Visited with Mr. Sobotka at his bedside, he is sleeping, unresponsive, remains on continuous morphine  infusion. Many family members at bedside during time of visit, they express their appreciation of the care Mr. Bailey has received.  Symptoms continue to be well controlled with current regimen, no changes to be made to [facility] at this time.  Answered and addressed all questions and concerns. PMT to continue to follow for ongoing needs and  support.  Objective:  Physical Exam Constitutional:      Comments: Unresponsive  Pulmonary:     Effort: Pulmonary effort is normal. No respiratory distress.  Skin:    General: Skin is warm and dry.     Findings: Bruising present.             Vital Signs: BP (!) 88/62 (BP Location: Left Wrist)   Pulse 93   Temp 98.6 F (37 C) (Oral)   Resp (!) 6   Wt 76.7 kg   SpO2 100%   BMI 26.48 kg/m  SpO2: SpO2: 100 % O2 Device: O2 Device: Nasal Cannula O2 Flow Rate: O2 Flow Rate (L/min): 3 L/min  Palliative Care Assessment & Plan   Assessment/Recommendation/Plan  Comfort measures continue Anticipate hospital death  Care plan was discussed with nursing staff.  Thank you for allowing the Palliative Medicine Team to assist in the care of this patient.  I personally spent a total of 25 minutes in the care of the patient today including preparing to see the patient, getting/reviewing separately obtained history, performing a medically appropriate exam/evaluation, referring and communicating with other health care professionals, and coordinating care.   Waddell Lesches, DNP, AGNP-C Palliative Medicine   Please contact Palliative Medicine Team phone at [phone] for questions and concerns.   "

## 2025-01-07 NOTE — Progress Notes (Addendum)
 " Progress Note   Patient: Terry [name] FMW:[id number] DOB: 11-11-1951 DOA: 01/03/2025     4 DOS: the patient was seen and examined on 01/07/2025   Brief hospital course: Terry [name] is a 74 y.o. Caucasian male with medical history significant for hypertension, dyslipidemia, Paroxysmal atrial fibrillation/flutter, GERD, diverticulosis and mitral valve prolapse as well as CVA, presented to the emergency room with acute onset of recently diminished appetite and hypotension that was noted today by speech therapist.  Per patient brother, patient also has been short of breath for the past week. Patient was placed on antibiotics for possible aspiration pneumonia and IV Lasix  for exacerbation of congestive heart failure.  Patient had evidence of cardiogenic shock with hypotension, lactic acidosis, hypothermia.  Patient was placed on broad-spectrum antibiotics for possible aspiration, also started on IV Lasix . Overnight on 1/8 patient developed severe respite distress, procalcitonin level went up to 25,000, also developed liver function changes, consistent with shock liver. After long discussion with the family, decision was made to transition to comfort care.    Principal Problem:   Sepsis due to undetermined organism [facility]) Active Problems:   Acute on chronic combined systolic and diastolic CHF (congestive heart failure) (HCC)   Paroxysmal atrial fibrillation (HCC)   GERD without esophagitis   Dementia without behavioral disturbance (HCC)   Assessment and Plan: Severe sepsis due to undetermined organism (HCC)  aspiration pneumonia of both lower lobes Dysphagia. Patient has significant tachycardia, tachypnea, lactic acidosis.  Most likely due to aspiration pneumonia of bilateral lower lobes.  Patient is placed on broad-spectrum antibiotics.   But the patient was transition to comfort care on 1/9. No additional treatment.   Acute on chronic combined systolic and diastolic CHF (congestive heart  failure) (HCC) cardiogenic shock with hypotension. Ruled in, POA Shock liver. Acute kidney injury secondary to cardiogenic shock. Moderate to severe mitral stenosis. Moderate aortic stenosis. Non-STEMI secondary to cardiogenic shock. Repeat echocardiogram showed ejection fraction dropped down to 20 to 25%.  Patient is getting cardiogenic shock.  Transition to comfort care on 1/9, currently on morphine  drip. Patient still unresponsive, blood pressure is low.  Anticipating death in 1-3 days.   Paroxysmal atrial fibrillation (HCC) with RVR. No additional treatment..   Dementia without behavioral disturbance (HCC) No additional treatment.   GERD without esophagitis     History of stroke with right hemiparesis. Severe debility with bedbound status.   Pressure ulcer POA Wound 01/04/25 1445 Pressure Injury Buttocks Left Stage 2 -  Partial thickness loss of dermis presenting as a shallow open injury with a red, pink wound bed without slough. (Active)         Subjective:  Patient is unresponsive, no short of breath on morphine  drip.  Physical Exam: Vitals:   01/05/25 1600 01/06/25 0829 01/06/25 1234 01/07/25 0744  BP:  (!) 74/49 (!) 80/49 (!) 88/62  Pulse: (!) 32 66 97 93  Resp: (!) 33 12  (!) 6  Temp:  99.4 F (37.4 C) 97.9 F (36.6 C) 98.6 F (37 C)  TempSrc:    Oral  SpO2: (!) 76% 91% 96% 100%  Weight:       General exam: Ill-appearing and comfortable  Respiratory system: Decreased respiratory rate. Cardiovascular system: Irregular. No JVD, murmurs, rubs, gallops or clicks. No pedal edema. Gastrointestinal system: Abdomen is nondistended, soft and nontender. No organomegaly or masses felt. Normal bowel sounds heard. Central nervous system: Unresponsive. Extremities: Symmetric 5 x 5 power. Skin: No rashes, lesions or ulcers  Data Reviewed:  There are no new results to review at this time.  Family Communication: Son at bedside.  Disposition: Status is:  Inpatient Remains inpatient appropriate because: Comfort care.     Time spent: 35 minutes  Author: Murvin Mana, MD 01/07/2025 3:06 PM  For on call review [email or web address].    "

## 2025-01-07 NOTE — Progress Notes (Signed)
 [facility] Cardiology    SUBJECTIVE: Denies any chest pain has history of dementia denies any palpitation tachycardia no chest pain blood pressure still running relatively low   Vitals:   01/05/25 1600 01/06/25 0829 01/06/25 1234 01/07/25 0744  BP:  (!) 74/49 (!) 80/49 (!) 88/62  Pulse: (!) 32 66 97 93  Resp: (!) 33 12  (!) 6  Temp:  99.4 F (37.4 C) 97.9 F (36.6 C) 98.6 F (37 C)  TempSrc:    Oral  SpO2: (!) 76% 91% 96% 100%  Weight:         Intake/Output Summary (Last 24 hours) at 01/07/2025 1451 Last data filed at 01/07/2025 1335 Gross per 24 hour  Intake 96.47 ml  Output --  Net 96.47 ml      PHYSICAL EXAM  General: Well developed, well nourished, in no acute distress HEENT:  Normocephalic and atramatic Neck:  No JVD.  Lungs: Clear bilaterally to auscultation and percussion. Heart: HRRR . Normal S1 and S2 without gallops or murmurs.  Abdomen: Bowel sounds are positive, abdomen soft and non-tender  Msk:  Back normal, normal gait. Normal strength and tone for age. Extremities: No clubbing, cyanosis or edema.   Neuro: Alert and oriented X 3. Psych:  Good affect, responds appropriately   LABS: Basic Metabolic Panel: Recent Labs    01/05/25 0434  NA 140  K 4.2  CL 103  CO2 17*  GLUCOSE 91  BUN 40*  CREATININE 1.37*  CALCIUM  8.6*  MG 2.2   Liver Function Tests: Recent Labs    01/05/25 0434  AST 3,200*  ALT 2,745*  ALKPHOS 231*  BILITOT 1.9*  PROT 5.8*  ALBUMIN  3.3*   No results for input(s): LIPASE, AMYLASE in the last 72 hours. CBC: Recent Labs    01/05/25 0434  WBC 9.2  NEUTROABS 7.4  HGB 11.0*  HCT 32.7*  MCV 100.3*  PLT 98*   Cardiac Enzymes: No results for input(s): CKTOTAL, CKMB, CKMBINDEX, TROPONINI in the last 72 hours. BNP: Invalid input(s): POCBNP D-Dimer: Recent Labs    01/04/25 1639 01/05/25 0434  DDIMER 1.40* 2.24*   Hemoglobin A1C: No results for input(s): HGBA1C in the last 72 hours. Fasting Lipid  Panel: No results for input(s): CHOL, HDL, LDLCALC, TRIG, CHOLHDL, LDLDIRECT in the last 72 hours. Thyroid Function Tests: No results for input(s): TSH, T4TOTAL, T3FREE, THYROIDAB in the last 72 hours.  Invalid input(s): FREET3 Anemia Panel: No results for input(s): VITAMINB12, FOLATE, FERRITIN, TIBC, IRON, RETICCTPCT in the last 72 hours.  No results found.   Echo evaluate for send ventricular function EF of 20 to 25%  TELEMETRY: Sinus rhythm with frequent PACs rate of 80 nonspecific ST-T wave changes:  ASSESSMENT AND PLAN:  Principal Problem:   Sepsis due to undetermined organism [facility]) Active Problems:   Paroxysmal atrial fibrillation (HCC)   Acute on chronic combined systolic and diastolic CHF (congestive heart failure) (HCC)   GERD without esophagitis   Dementia without behavioral disturbance (HCC)    Plan Atrial fibrillation continue current therapy anticoagulation and rate control Aspiration pneumonia continue broad-spectrum antibiotic therapy for sepsis GERD recommend PPI therapy for reflux type symptoms Chronic systolic and diastolic congestive heart failure continue heart failure therapy EF around 20 to 25% Significant aortic and mitral valve stenosis consider further evaluation and management Reactive dementia continue conservative therapy Family is requesting palliative care and comfort measures Cardiology will sign off   Cara JONETTA Lovelace, MD 01/07/2025 2:51 PM

## 2025-01-07 NOTE — Plan of Care (Signed)
  Problem: Pain Managment: Goal: General experience of comfort will improve and/or be controlled Outcome: Progressing   Problem: Safety: Goal: Ability to remain free from injury will improve Outcome: Progressing

## 2025-01-08 DIAGNOSIS — E872 Acidosis, unspecified: Secondary | ICD-10-CM | POA: Insufficient documentation

## 2025-01-08 DIAGNOSIS — K7200 Acute and subacute hepatic failure without coma: Secondary | ICD-10-CM | POA: Insufficient documentation

## 2025-01-08 DIAGNOSIS — Z66 Do not resuscitate: Secondary | ICD-10-CM

## 2025-01-08 DIAGNOSIS — D696 Thrombocytopenia, unspecified: Secondary | ICD-10-CM | POA: Insufficient documentation

## 2025-01-08 DIAGNOSIS — E663 Overweight: Secondary | ICD-10-CM | POA: Insufficient documentation

## 2025-01-08 DIAGNOSIS — J690 Pneumonitis due to inhalation of food and vomit: Secondary | ICD-10-CM

## 2025-01-08 DIAGNOSIS — I214 Non-ST elevation (NSTEMI) myocardial infarction: Secondary | ICD-10-CM | POA: Insufficient documentation

## 2025-01-08 LAB — CULTURE, BLOOD (ROUTINE X 2)
Culture: NO GROWTH
Culture: NO GROWTH
Special Requests: ADEQUATE

## 2025-01-08 NOTE — Plan of Care (Signed)
  Problem: Pain Managment: Goal: General experience of comfort will improve and/or be controlled Outcome: Progressing

## 2025-01-08 NOTE — Progress Notes (Signed)
 "                                                                                                                                                                                               Palliative Care Progress Note, Assessment & Plan   Patient Name: Terry [name]       Date: 01/08/2025 DOB: October 23, 1951  Age: 74 y.o. MRN#: [id number] Attending Physician: Laurita Pillion, MD Primary Care Physician: [name], MD Admit Date: 01/03/2025  Subjective: Patient is lying in bed in no apparent distress.  He does not awaken to my presence.  His wife and other family numbers are at bedside during the visit.  Patient continues to have morphine  gtt for management of symptoms at end-of-life care.  HPI: 74 y.o. male  with past medical history of hypertension, hyperlipidemia, CAD, paroxysmal A-fib/flutter, mitral valve prolapse s/p bioprosthetic valve placement 2021, recurrent CVA admitted from home on 01/03/2025 with poor appetite, hypotension found by SLP during a home visit and increased shortness of breath.    Admitted and being treated for possible aspiration PNA with antibiotics and acute CHF with IV lasix  Met sepsis criteria Placed NPO by SLP due to concerns of ongoing aspiration Developed cardiogenic shock Acute change in condition early 1/9 AM, transferred to SD unit, placed on bipap, shock liver noted and worsening renal function--ICU attending consulted   Transitioned to [facility] 1/9   Palliative medicine was consulted for assisting with goals of care conversations.  Following up today for symptom management and monitoring of comfort focused care.   Summary of counseling/coordination of care: Chart review completed prior to meeting patient including: -Vital signs-last BP taken at 0536 this morning was 70/54 -Orders: Reviewed comfort focused care orders and MAR, patient has as needed Haldol , Robinul , and morphine  boluses available for symptom management, continue morphine  drip continues at 3  mg/h  After reviewing the patient's chart and assessing the patient at bedside, I spoke with patient family at bedside in regards to symptom management and goals of care.   Family endorses patient is comfortable.  They shared he is having long times of not breathing but then returns to regular, steady breaths.  During my evaluation, patient's there was exhibiting Cheyne-Stokes breathing.  Education bided to family that this is a expected symptom as patient is transitioning to end-of-life.  Reviewed that he is not gasping for air or showing signs of respiratory distress.  I highlighted that morphine  at 3 mg/h seems appropriate at this time.  Discussed we have the ability to titrate up if needed.  However, patient was comfortable and no adjustment  to [facility] needed at this time.  Therapeutic silence, active listening, and emotional support provided to patient and family at bedside.  Full comfort focused care and continues.  I anticipate in-hospital death.  PMT will continue to follow and support.  Physical Exam Vitals reviewed.  Constitutional:      General: He is not in acute distress. HENT:     Mouth/Throat:     Mouth: Mucous membranes are moist.  Eyes:     Comments: Fixed gaze  Pulmonary:     Comments: Cheynes-Stokes breathing Skin:    General: Skin is warm and dry.     Coloration: Skin is pale.  Neurological:     Comments: Non verbal              Recommendations:   Comfort measures continue Morphine  gtt. to continue I anticipate in-hospital death  I personally spent a total of 25 minutes in the care of the patient today including preparing to see the patient, performing a medically appropriate exam/evaluation, counseling and educating, and documenting clinical information in the EHR.   Lamarr L. Arvid, DNP, FNP-BC Palliative Medicine Team   "

## 2025-01-08 NOTE — Progress Notes (Signed)
 " Progress Note   Patient: Terry [name] FMW:[id number] DOB: 1951-09-06 DOA: 01/03/2025     5 DOS: the patient was seen and examined on 01/08/2025   Brief hospital course: Terry [name] is a 74 y.o. Caucasian male with medical history significant for hypertension, dyslipidemia, Paroxysmal atrial fibrillation/flutter, GERD, diverticulosis and mitral valve prolapse as well as CVA, presented to the emergency room with acute onset of recently diminished appetite and hypotension that was noted today by speech therapist.  Per patient brother, patient also has been short of breath for the past week. Patient was placed on antibiotics for possible aspiration pneumonia and IV Lasix  for exacerbation of congestive heart failure.  Patient had evidence of cardiogenic shock with hypotension, lactic acidosis, hypothermia.  Patient was placed on broad-spectrum antibiotics for possible aspiration, also started on IV Lasix . Overnight on 1/8 patient developed severe respite distress, procalcitonin level went up to 25,000, also developed liver function changes, consistent with shock liver. After long discussion with the family, decision was made to transition to comfort care.    Principal Problem:   Sepsis due to undetermined organism [facility]) Active Problems:   Acute on chronic combined systolic and diastolic CHF (congestive heart failure) (HCC)   Paroxysmal atrial fibrillation (HCC)   GERD without esophagitis   Dementia without behavioral disturbance (HCC)   Cardiogenic shock (HCC)   Pressure injury of skin   Assessment and Plan: Severe sepsis due to undetermined organism (HCC)  aspiration pneumonia of both lower lobes Dysphagia. Patient has significant tachycardia, tachypnea, lactic acidosis.  Most likely due to aspiration pneumonia of bilateral lower lobes.  Patient is placed on broad-spectrum antibiotics.   But the patient was transition to comfort care on 1/9.    Acute on chronic combined systolic and  diastolic CHF (congestive heart failure) (HCC) cardiogenic shock with hypotension. Ruled in, POA Shock liver. Acute kidney injury secondary to cardiogenic shock. Moderate to severe mitral stenosis. Moderate aortic stenosis. Non-STEMI secondary to cardiogenic shock. Repeat echocardiogram showed ejection fraction dropped down to 20 to 25%.  Patient is getting cardiogenic shock.  Transition to comfort care on 1/9, currently on morphine  drip. Patient is active dying, blood pressure is low, has prolonged apnea.  Continue comfort care   Paroxysmal atrial fibrillation (HCC) with RVR. No additional treatment..   Dementia without behavioral disturbance (HCC) No additional treatment.   GERD without esophagitis     History of stroke with right hemiparesis. Severe debility with bedbound status.   Pressure ulcer POA Wound 01/04/25 1445 Pressure Injury Buttocks Left Stage 2 -  Partial thickness loss of dermis presenting as a shallow open injury with a red, pink wound bed without slough. (Active)         Subjective:  Patient is unresponsive  Physical Exam: Vitals:   01/06/25 0829 01/06/25 1234 01/07/25 0744 01/08/25 0536  BP: (!) 74/49 (!) 80/49 (!) 88/62 (!) 70/54  Pulse: 66 97 93 95  Resp: 12  (!) 6 16  Temp: 99.4 F (37.4 C) 97.9 F (36.6 C) 98.6 F (37 C) 98.4 F (36.9 C)  TempSrc:   Oral Axillary  SpO2: 91% 96% 100% 91%  Weight:       General exam: Ill-appearing, no distress. Respiratory system: Clear to auscultation. Respiratory effort normal. Cardiovascular system: S1 & S2 heard, RRR. No JVD, murmurs, rubs, gallops or clicks. No pedal edema. Gastrointestinal system: Abdomen is nondistended, soft and nontender. No organomegaly or masses felt. Normal bowel sounds heard. Central nervous system: Unresponsive. Extremities:  Symmetric Skin: No rashes, lesions or ulcers    Data Reviewed:  There are no new results to review at this time.  Family Communication: Multiple  family members in the room and updated.  Disposition: Status is: Inpatient Remains inpatient appropriate because: Comfort care.     Time spent: 35 minutes  Author: Murvin Mana, MD 01/08/2025 11:59 AM  For on call review [email or web address]    "

## 2025-01-08 NOTE — Plan of Care (Signed)
  Problem: Pain Managment: Goal: General experience of comfort will improve and/or be controlled Outcome: Progressing   Problem: Safety: Goal: Ability to remain free from injury will improve Outcome: Progressing

## 2025-01-09 DIAGNOSIS — K7200 Acute and subacute hepatic failure without coma: Secondary | ICD-10-CM

## 2025-01-09 NOTE — Progress Notes (Signed)
 "                                                                                                                                                                                               Palliative Care Progress Note, Assessment & Plan   Patient Name: Terry [name]       Date: 01/09/2025 DOB: 02-Aug-1951  Age: 74 y.o. MRN#: [id number] Attending Physician: Laurita Pillion, MD Primary Care Physician: [name], MD Admit Date: 01/03/2025  Subjective: Patient is lying in bed, sleeping, in no apparent distress. Terry [name] does not awaken to my presence or respond to light physical touch. His eyes are open and gaze is fixed. His wife, son, and friends are present at bedside during my visit.   HPI: 74 y.o. male  with past medical history of hypertension, hyperlipidemia, CAD, paroxysmal A-fib/flutter, mitral valve prolapse s/p bioprosthetic valve placement 2021, recurrent CVA admitted from home on 01/03/2025 with poor appetite, hypotension found by SLP during a home visit and increased shortness of breath.    Admitted and being treated for possible aspiration PNA with antibiotics and acute CHF with IV lasix  Met sepsis criteria Placed NPO by SLP due to concerns of ongoing aspiration Developed cardiogenic shock Acute change in condition early 1/9 AM, transferred to SD unit, placed on bipap, shock liver noted and worsening renal function--ICU attending consulted   Transitioned to [facility] 1/9   Palliative medicine was consulted for assisting with goals of care conversations.  Following up today for symptom management of end of life/comfort focused care.   Summary of counseling/coordination of care: Chart reviewed. For some reason vital signs were still taken at 848am this AM reflecting abnormal BP 62/39, HR 47, and stating 91% on room air. Review of MAR reveals morphine  gtt has been stable at 3mg /hr with 1 bolus given overnight.   After reviewing the patient's chart and assessing the patient at bedside, I  spoke with patient's wife and son at bedside in regards to symptom management and goals of care.  Family endorses patient had a stable, uneventful night. They were grateful that robinol and mouth care was given. They endorsed his breathing has been steady and unchanged.   From my assessment, patient has no signs of distress, breathlessness, air hunger, pain, discomfort. Morphine  gtt remains at 3mg /hr with 1 bolus given in last 24H. Discussed with family use of boluses for increased work of breathing and pain. They understand and endorse patient has had not signs of increased work of breathing overnight and today.   Medication regimen appears to be well managing patient's symptoms. No adjustment to  MAR needed.    Therapeutic silence and active listening provided for patient's wife and son to share their thoughts and emotions regarding current medical situation.  Emotional support provided. They are grateful for the double room, communication, and excellent care Terry [name] has received.   CMO remain. I continue to anticipate an in-hospital death.   Physical Exam Vitals reviewed.  Constitutional:      General: Terry [name] is not in acute distress. HENT:     Head: Normocephalic.  Abdominal:     Palpations: Abdomen is soft.  Skin:    General: Skin is warm and dry.     Coloration: Skin is pale.     Comments: All extremities warm to touch              Recommendations:   Comfort measures remain In-hospital death anticipated  I personally spent a total of 25 minutes in the care of the patient today including preparing to see the patient, performing a medically appropriate exam/evaluation, counseling and educating, and documenting clinical information in the EHR.   Lamarr L. Arvid, DNP, FNP-BC Palliative Medicine Team   "

## 2025-01-09 NOTE — Plan of Care (Signed)
  Problem: Pain Managment: Goal: General experience of comfort will improve and/or be controlled Outcome: Progressing

## 2025-01-09 NOTE — Progress Notes (Signed)
 " Progress Note   Patient: Terry [name] FMW:[id number] DOB: [birth year] DOA: 01/03/2025     6 DOS: the patient was seen and examined on 01/09/2025   Brief hospital course: Terry [name] is a 74 y.o. Caucasian male with medical history significant for hypertension, dyslipidemia, Paroxysmal atrial fibrillation/flutter, GERD, diverticulosis and mitral valve prolapse as well as CVA, presented to the emergency room with acute onset of recently diminished appetite and hypotension that was noted today by speech therapist.  Per patient brother, patient also has been short of breath for the past week. Patient was placed on antibiotics for possible aspiration pneumonia and IV Lasix  for exacerbation of congestive heart failure.  Patient had evidence of cardiogenic shock with hypotension, lactic acidosis, hypothermia.  Patient was placed on broad-spectrum antibiotics for possible aspiration, also started on IV Lasix . Overnight on 1/8 patient developed severe respite distress, procalcitonin level went up to 25,000, also developed liver function changes, consistent with shock liver. After long discussion with the family, decision was made to transition to comfort care.    Principal Problem:   Sepsis due to undetermined organism [facility]) Active Problems:   Acute on chronic combined systolic and diastolic CHF (congestive heart failure) (HCC)   Paroxysmal atrial fibrillation (HCC)   AKI (acute kidney injury)   GERD without esophagitis   Dementia without behavioral disturbance (HCC)   Cardiogenic shock (HCC)   Pressure injury of skin   Overweight (BMI 25.0-29.9)   Metabolic acidosis   Thrombocytopenia   Shock liver   NSTEMI (non-ST elevated myocardial infarction) (HCC)   Assessment and Plan:  Severe sepsis due to undetermined organism (HCC)  aspiration pneumonia of both lower lobes Dysphagia. Patient has significant tachycardia, tachypnea, lactic acidosis.  Most likely due to aspiration pneumonia of  bilateral lower lobes.  Patient is placed on broad-spectrum antibiotics.   But the patient was transition to comfort care on 1/9. No additional treatment.     Acute on chronic combined systolic and diastolic CHF (congestive heart failure) (HCC) cardiogenic shock with hypotension. Ruled in, POA Shock liver. Acute kidney injury secondary to cardiogenic shock. Moderate to severe mitral stenosis. Moderate aortic stenosis. Non-STEMI secondary to cardiogenic shock. Repeat echocardiogram showed ejection fraction dropped down to 20 to 25%.  Patient is getting cardiogenic shock.  Transition to comfort care on 1/9, currently on morphine  drip. Patient currently is comfortable, but unresponsive.  Anticipating death in 1 to 2 days.   Paroxysmal atrial fibrillation (HCC) with RVR. No additional treatment..   Dementia without behavioral disturbance (HCC) No additional treatment.   GERD without esophagitis     History of stroke with right hemiparesis. Severe debility with bedbound status.   Pressure ulcer POA Wound 01/04/25 1445 Pressure Injury Buttocks Left Stage 2 -  Partial thickness loss of dermis presenting as a shallow open injury with a red, pink wound bed without slough. (Active)       Subjective:  Patient is comfortable, but unresponsive.  Physical Exam: Vitals:   01/07/25 0744 01/08/25 0536 01/08/25 2029 01/09/25 0848  BP: (!) 88/62 (!) 70/54 (!) 91/40 (!) 62/39  Pulse: 93 95 (!) 109 88  Resp: (!) 6 16 16 16   Temp: 98.6 F (37 C) 98.4 F (36.9 C) 100.2 F (37.9 C) (!) 101 F (38.3 C)  TempSrc: Oral Axillary Oral Oral  SpO2: 100% 91% (!) 71% 91%  Weight:       General exam: Appears calm and comfortable  Respiratory system: Clear to auscultation. Respiratory effort normal.  Cardiovascular system: Regular with tachycardia. No JVD, murmurs, rubs, gallops or clicks. No pedal edema. Gastrointestinal system: Abdomen is nondistended, soft and nontender. No organomegaly or  masses felt. Normal bowel sounds heard. Central nervous system: Unresponsive. Extremities: Symmetric  Skin: No rashes, lesions or ulcers    Data Reviewed:  There are no new results to review at this time.  Family Communication: Son and the daughter updated at bedside.  Disposition: Status is: Inpatient Remains inpatient appropriate because: Comfort care, per family wishes, patient will die in the hospital     Time spent: 35 minutes  Author: Murvin Mana, MD 01/09/2025 2:24 PM  For on call review [email or web address].    "

## 2025-01-28 NOTE — Death Summary Note (Signed)
 "  DEATH SUMMARY   Patient Details  Name: Terry [name] MRN: [id number] DOB: 1951-06-24 ERE:Terry [name], Terry BIRCH, MD Admission/Discharge Information   Admit Date:  01/06/25  Date of Death: Date of Death: 01/13/25  Time of Death: Time of Death: 74  Length of Stay: 7   Principle Cause of death: cardiogenic shock  Hospital Diagnoses: Principal Problem:   Sepsis due to undetermined organism [facility]) Active Problems:   Acute on chronic combined systolic and diastolic CHF (congestive heart failure) (HCC)   Paroxysmal atrial fibrillation (HCC)   AKI (acute kidney injury)   GERD without esophagitis   Dementia without behavioral disturbance (HCC)   Cardiogenic shock (HCC)   Pressure injury of skin   Overweight (BMI 25.0-29.9)   Metabolic acidosis   Thrombocytopenia   Shock liver   NSTEMI (non-ST elevated myocardial infarction) [facility])   Hospital Course: DEVEAN SKOCZYLAS is a 74 y.o. Caucasian male with medical history significant for hypertension, dyslipidemia, Paroxysmal atrial fibrillation/flutter, GERD, diverticulosis and mitral valve prolapse as well as CVA, presented to the emergency room with acute onset of recently diminished appetite and hypotension that was noted today by speech therapist.  Per patient brother, patient also has been short of breath for the past week. Patient was placed on antibiotics for possible aspiration pneumonia and IV Lasix  for exacerbation of congestive heart failure.  Patient had evidence of cardiogenic shock with hypotension, lactic acidosis, hypothermia.  Patient was placed on broad-spectrum antibiotics for possible aspiration, also started on IV Lasix . Overnight on 1/8 patient developed severe respite distress, procalcitonin level went up to 25,000, also developed liver function changes, consistent with shock liver. After long discussion with the family, decision was made to transition to comfort care. Patient died in peace on Jan 14, 2024 at 540am  Assessment  and Plan: Severe sepsis due to undetermined organism [facility])  aspiration pneumonia of both lower lobes Dysphagia. Patient has significant tachycardia, tachypnea, lactic acidosis.  Most likely due to aspiration pneumonia of bilateral lower lobes.  Patient is placed on broad-spectrum antibiotics.   But the patient was transition to comfort care on 1/9. No additional treatment.     Acute on chronic combined systolic and diastolic CHF (congestive heart failure) (HCC) cardiogenic shock with hypotension. Ruled in, POA Shock liver. Acute kidney injury secondary to cardiogenic shock. Moderate to severe mitral stenosis. Moderate aortic stenosis. Non-STEMI secondary to cardiogenic shock. Repeat echocardiogram showed ejection fraction dropped down to 20 to 25%.  Patient is getting cardiogenic shock.  Transition to comfort care on 1/9, currently on morphine  drip. Patient currently is comfortable, but unresponsive.  Anticipating death in 1 to 2 days.   Paroxysmal atrial fibrillation (HCC) with RVR. No additional treatment..   Dementia without behavioral disturbance (HCC) No additional treatment.   GERD without esophagitis     History of stroke with right hemiparesis. Severe debility with bedbound status.   Pressure ulcer POA Wound 01/04/25 1445 Pressure Injury Buttocks Left Stage 2 -  Partial thickness loss of dermis presenting as a shallow open injury with a red, pink wound bed without slough. (Active)            Procedures: None  Consultations: cardiology  The results of significant diagnostics from this hospitalization (including imaging, microbiology, ancillary and laboratory) are listed below for reference.   Significant Diagnostic Studies: ECHOCARDIOGRAM COMPLETE Result Date: 01/05/2025    ECHOCARDIOGRAM REPORT   Patient Name:   Terry [name] Date of Exam: 01/05/2025 Medical Rec #:  [id number]  Height:       67.0 in Accession #:    [id number]   Weight:       169.1 lb Date of  Birth:  Jan 15, 1951   BSA:          1.883 m Patient Age:    73 years     BP:           97/84 mmHg Patient Gender: M            HR:           83 bpm. Exam Location:  [facility] Procedure: 2D Echo, Color Doppler and Cardiac Doppler (Both Spectral and Color            Flow Doppler were utilized during procedure). Indications:     CHF-Acute Systolic I50.21  History:         Patient has prior history of Echocardiogram examinations, most                  recent 08/18/2024. CHF; Mitral Valve Prolapse.  Sonographer:     Ashley McNeely-Sloane Referring Phys:  [id number] CARALYN HUDSON Diagnosing Phys: Dwayne D Callwood MD IMPRESSIONS  1. Left ventricular ejection fraction, by estimation, is 20 to 25%. The left ventricle has severely decreased function. The left ventricle demonstrates global hypokinesis. The left ventricular internal cavity size was severely dilated. There is mild concentric left ventricular hypertrophy. Left ventricular diastolic parameters are consistent with Grade I diastolic dysfunction (impaired relaxation).  2. Right ventricular systolic function is moderately reduced. The right ventricular size is mildly enlarged.  3. Left atrial size was mildly dilated.  4. Right atrial size was mildly dilated.  5. The mitral valve has been repaired/replaced. Mild mitral valve regurgitation.  6. Tricuspid valve regurgitation is moderate.  7. The aortic valve is grossly normal. Aortic valve regurgitation is mild. Aortic valve sclerosis is present, with no evidence of aortic valve stenosis.  8. Aortic dilatation noted. There is mild dilatation of the aortic root, measuring 38 mm. FINDINGS  Left Ventricle: Left ventricular ejection fraction, by estimation, is 20 to 25%. The left ventricle has severely decreased function. The left ventricle demonstrates global hypokinesis. Strain was performed and the global longitudinal strain is indeterminate. The left ventricular internal cavity size was severely dilated. There is mild  concentric left ventricular hypertrophy. Left ventricular diastolic parameters are consistent with Grade I diastolic dysfunction (impaired relaxation). Right Ventricle: The right ventricular size is mildly enlarged. No increase in right ventricular wall thickness. Right ventricular systolic function is moderately reduced. Left Atrium: Left atrial size was mildly dilated. Right Atrium: Right atrial size was mildly dilated. Pericardium: There is no evidence of pericardial effusion. Mitral Valve: The mitral valve has been repaired/replaced. Mild mitral valve regurgitation. There is a bioprosthetic valve present in the mitral position. MV peak gradient, 5.5 mmHg. The mean mitral valve gradient is 4.0 mmHg. Tricuspid Valve: The tricuspid valve is grossly normal. Tricuspid valve regurgitation is moderate. Aortic Valve: The aortic valve is grossly normal. Aortic valve regurgitation is mild. Aortic valve sclerosis is present, with no evidence of aortic valve stenosis. Aortic valve mean gradient measures 4.0 mmHg. Aortic valve peak gradient measures 7.6 mmHg. Aortic valve area, by VTI measures 2.15 cm. Pulmonic Valve: The pulmonic valve was not well visualized. Pulmonic valve regurgitation is trivial. Aorta: The aortic root was not well visualized and aortic dilatation noted. There is mild dilatation of the aortic root, measuring 38 mm. IAS/Shunts: No atrial level shunt detected by color  flow Doppler. Additional Comments: 3D was performed not requiring image post processing on an independent workstation and was indeterminate.  LEFT VENTRICLE PLAX 2D LVIDd:         6.00 cm      Diastology LVIDs:         5.00 cm      LV e' medial:    2.94 cm/s LV PW:         1.10 cm      LV E/e' medial:  30.2 LV IVS:        1.20 cm      LV e' lateral:   6.85 cm/s LVOT diam:     2.40 cm      LV E/e' lateral: 13.0 LV SV:         37 LV SV Index:   20 LVOT Area:     4.52 cm  LV Volumes (MOD) LV vol d, MOD A2C: 218.0 ml LV vol d, MOD A4C: 150.0  ml LV vol s, MOD A2C: 196.0 ml LV vol s, MOD A4C: 144.0 ml LV SV MOD A2C:     22.0 ml LV SV MOD A4C:     150.0 ml LV SV MOD BP:      14.9 ml RIGHT VENTRICLE          IVC RV Basal diam:  5.70 cm  IVC diam: 2.10 cm RV Mid diam:    3.00 cm TAPSE (M-mode): 1.1 cm LEFT ATRIUM             Index        RIGHT ATRIUM           Index LA diam:        3.30 cm 1.75 cm/m   RA Area:     18.30 cm LA Vol (A2C):   51.2 ml 27.19 ml/m  RA Volume:   48.00 ml  25.49 ml/m LA Vol (A4C):   86.5 ml 45.94 ml/m LA Biplane Vol: 71.7 ml 38.08 ml/m  AORTIC VALVE                    PULMONIC VALVE AV Area (Vmax):    1.76 cm     PV Vmax:          0.81 m/s AV Area (Vmean):   1.61 cm     PV Vmean:         57.300 cm/s AV Area (VTI):     2.15 cm     PV VTI:           0.124 m AV Vmax:           138.00 cm/s  PV Peak grad:     2.7 mmHg AV Vmean:          91.750 cm/s  PV Mean grad:     1.0 mmHg AV VTI:            0.173 m      PR End Diast Vel: 7.18 msec AV Peak Grad:      7.6 mmHg     RVOT Peak grad:   1 mmHg AV Mean Grad:      4.0 mmHg LVOT Vmax:         53.80 cm/s LVOT Vmean:        32.700 cm/s LVOT VTI:          0.082 m LVOT/AV VTI ratio: 0.48  AORTA Ao Root diam: 3.80 cm Ao Asc diam:  3.00 cm  MITRAL VALVE                TRICUSPID VALVE MV Area (PHT): 4.49 cm     TR Peak grad:   26.6 mmHg MV Area VTI:   2.11 cm     TR Mean grad:   18.0 mmHg MV Peak grad:  5.5 mmHg     TR Vmax:        258.00 cm/s MV Mean grad:  4.0 mmHg     TR Vmean:       201.0 cm/s MV Vmax:       1.18 m/s MV Vmean:      93.0 cm/s    SHUNTS MV Decel Time: 169 msec     Systemic VTI:  0.08 m MV E velocity: 88.90 cm/s   Systemic Diam: 2.40 cm MV A velocity: 102.00 cm/s  Pulmonic VTI:  0.096 m MV E/A ratio:  0.87 Dwayne JONETTA Lovelace MD Electronically signed by Cara JONETTA Lovelace MD Signature Date/Time: 01/05/2025/3:16:37 PM    Final    DG Chest Port 1 View Result Date: 01/05/2025 EXAM: 1 VIEW XRAY OF THE CHEST 01/05/2025 04:36:34 AM COMPARISON: 01/03/2025 CLINICAL HISTORY:  74 year old male with shortness of breath. FINDINGS: Portable AP upright view(s) at 0433 hours LUNGS AND PLEURA: Chronic, since at least 2021, confluent curvilinear scarring in the mid right lung, with asymmetrically mild decreased right lung volume and blunting of the right costophrenic angle. Chronic coarsened markings without pulmonary edema. No pneumothorax. HEART AND MEDIASTINUM: Cardiomegaly. Prosthetic valve noted. BONES AND SOFT TISSUES: Paucity of bowel gas in the visible abdomen. Postsurgical changes overlying right hemithorax. No acute osseous abnormality. IMPRESSION: 1. No new cardiopulmonary abnormality. 2. Chronic right scarring / atelectasis, mildly decreased lung volume, which might be postoperative. Electronically signed by: Helayne Hurst MD MD 01/05/2025 04:56 AM EST RP Workstation: [id number]   CT HEAD WO CONTRAST ( ) Result Date: 01/04/2025 CLINICAL DATA:  Delirium. EXAM: CT HEAD WITHOUT CONTRAST TECHNIQUE: Contiguous axial images were obtained from the base of the skull through the vertex without intravenous contrast. RADIATION DOSE REDUCTION: This exam was performed according to the departmental dose-optimization program which includes automated exposure control, adjustment of the mA and/or kV according to patient size and/or use of iterative reconstruction technique. COMPARISON:  08/18/2024 FINDINGS: Brain: Ventricles, cisterns and other CSF spaces are within normal. Mild chronic ischemic microvascular disease. Old left basal ganglia lacunar infarct and old left peri insular infarct. Region of low-attenuation over the subcortical white matter of the right frontal region new since the prior exam likely subacute to chronic ischemic change. No mass, mass effect or shift of midline structures. No acute hemorrhage. Vascular: No hyperdense vessel or unexpected calcification. Skull: Normal. Negative for fracture or focal lesion. Sinuses/Orbits: Orbits are normal. Paranasal sinuses demonstrate no  air-fluid levels. Other: None. IMPRESSION: 1. No acute findings. 2. Mild chronic ischemic microvascular disease. Old left basal ganglia lacunar infarct and old left peri insular infarct. 3. Region of low-attenuation over the subcortical white matter of the right frontal region new since the prior exam likely subacute to chronic ischemic change. Electronically Signed   By: Toribio Agreste M.D.   On: 01/04/2025 13:48   DG Chest Port 1 View Result Date: 01/03/2025 CLINICAL DATA:  Questionable sepsis. EXAM: PORTABLE CHEST 1 VIEW COMPARISON:  May 07, 2020 FINDINGS: The cardiac silhouette is mildly enlarged and unchanged in size. An artificial mitral valve is noted. There is mild right-sided volume loss with stable mild to moderate severity areas  of scarring and/or atelectasis is seen within the mid right lung and right lung base. The left lung is clear. No pleural effusion or pneumothorax is identified. Multiple surgical clips are seen overlying the lateral aspect of the upper right hemithorax. The visualized skeletal structures are unremarkable. IMPRESSION: Mild right-sided volume loss with stable mild to moderate severity areas of scarring and/or atelectasis within the mid right lung and right lung base. Electronically Signed   By: Suzen Dials M.D.   On: 01/03/2025 13:25    Microbiology: Recent Results (from the past 240 hours)  Resp panel by RT-PCR (RSV, Flu A&B, Covid) Anterior Nasal Swab     Status: None   Collection Time: 01/03/25 12:53 PM   Specimen: Anterior Nasal Swab  Result Value Ref Range Status   SARS Coronavirus 2 by RT PCR NEGATIVE NEGATIVE Final    Comment: (NOTE) SARS-CoV-2 target nucleic acids are NOT DETECTED.  The SARS-CoV-2 RNA is generally detectable in upper respiratory specimens during the acute phase of infection. The lowest concentration of SARS-CoV-2 viral copies this assay can detect is 138 copies/mL. A negative result does not preclude SARS-Cov-2 infection and should  not be used as the sole basis for treatment or other patient management decisions. A negative result may occur with  improper specimen collection/handling, submission of specimen other than nasopharyngeal swab, presence of viral mutation(s) within the areas targeted by this assay, and inadequate number of viral copies(<138 copies/mL). A negative result must be combined with clinical observations, patient history, and epidemiological information. The expected result is Negative.  Fact Sheet for Patients:  bloggercourse.com  Fact Sheet for Healthcare Providers:  seriousbroker.it  This test is no t yet approved or cleared by the [place]  FDA and  has been authorized for detection and/or diagnosis of SARS-CoV-2 by FDA under an Emergency Use Authorization (EUA). This EUA will remain  in effect (meaning this test can be used) for the duration of the COVID-19 declaration under Section 564(b)(1) of the Act, 21 U.S.C.section 360bbb-3(b)(1), unless the authorization is terminated  or revoked sooner.       Influenza A by PCR NEGATIVE NEGATIVE Final   Influenza B by PCR NEGATIVE NEGATIVE Final    Comment: (NOTE) The Xpert Xpress SARS-CoV-2/FLU/RSV plus assay is intended as an aid in the diagnosis of influenza from Nasopharyngeal swab specimens and should not be used as a sole basis for treatment. Nasal washings and aspirates are unacceptable for Xpert Xpress SARS-CoV-2/FLU/RSV testing.  Fact Sheet for Patients: bloggercourse.com  Fact Sheet for Healthcare Providers: seriousbroker.it  This test is not yet approved or cleared by the [place]  FDA and has been authorized for detection and/or diagnosis of SARS-CoV-2 by FDA under an Emergency Use Authorization (EUA). This EUA will remain in effect (meaning this test can be used) for the duration of the COVID-19 declaration under  Section 564(b)(1) of the Act, 21 U.S.C. section 360bbb-3(b)(1), unless the authorization is terminated or revoked.     Resp Syncytial Virus by PCR NEGATIVE NEGATIVE Final    Comment: (NOTE) Fact Sheet for Patients: bloggercourse.com  Fact Sheet for Healthcare Providers: seriousbroker.it  This test is not yet approved or cleared by the [place]  FDA and has been authorized for detection and/or diagnosis of SARS-CoV-2 by FDA under an Emergency Use Authorization (EUA). This EUA will remain in effect (meaning this test can be used) for the duration of the COVID-19 declaration under Section 564(b)(1) of the Act, 21 U.S.C. section 360bbb-3(b)(1), unless the authorization is terminated  or revoked.  Performed at [facility], [address] [place], [place] [address]   Blood Culture (routine x 2)     Status: None   Collection Time: 01/03/25 12:53 PM   Specimen: BLOOD  Result Value Ref Range Status   Specimen Description BLOOD RIGHT ANTECUBITAL  Final   Special Requests   Final    BOTTLES DRAWN AEROBIC AND ANAEROBIC Blood Culture results may not be optimal due to an inadequate volume of blood received in culture bottles   Culture   Final    NO GROWTH 5 DAYS Performed at [facility], [address] [place], [place] [address]    Report Status 01/08/2025 FINAL  Final  Blood Culture (routine x 2)     Status: None   Collection Time: 01/03/25 12:54 PM   Specimen: BLOOD  Result Value Ref Range Status   Specimen Description BLOOD LEFT ANTECUBITAL  Final   Special Requests   Final    BOTTLES DRAWN AEROBIC AND ANAEROBIC Blood Culture adequate volume   Culture   Final    NO GROWTH 5 DAYS Performed at [facility], [address] [place], [place] [address]    Report Status 01/08/2025 FINAL  Final  MRSA Next Gen by PCR, Nasal     Status: None   Collection Time: 01/04/25  8:06 PM   Specimen: Nasal  Mucosa; Nasal Swab  Result Value Ref Range Status   MRSA by PCR Next Gen NOT DETECTED NOT DETECTED Final    Comment: (NOTE) The GeneXpert MRSA Assay (FDA approved for NASAL specimens only), is one component of a comprehensive MRSA colonization surveillance program. It is not intended to diagnose MRSA infection nor to guide or monitor treatment for MRSA infections. Test performance is not FDA approved in patients less than 46 years old. Performed at [facility], [address], [place], [place] [address]     Time spent: No charge minutes  Signed: Murvin Mana, MD 01/11/25   "

## 2025-01-28 DEATH — deceased
